# Patient Record
Sex: Female | Born: 1945
Health system: Southern US, Community
[De-identification: ages and names within clinical notes are randomized; demographics above are authoritative.]

## PROBLEM LIST (undated history)

## (undated) DIAGNOSIS — H3530 Unspecified macular degeneration: Secondary | ICD-10-CM

## (undated) DIAGNOSIS — Z9989 Dependence on other enabling machines and devices: Secondary | ICD-10-CM

## (undated) DIAGNOSIS — M8580 Other specified disorders of bone density and structure, unspecified site: Secondary | ICD-10-CM

## (undated) DIAGNOSIS — E538 Deficiency of other specified B group vitamins: Secondary | ICD-10-CM

## (undated) DIAGNOSIS — C9110 Chronic lymphocytic leukemia of B-cell type not having achieved remission: Secondary | ICD-10-CM

## (undated) DIAGNOSIS — G4733 Obstructive sleep apnea (adult) (pediatric): Secondary | ICD-10-CM

## (undated) DIAGNOSIS — M549 Dorsalgia, unspecified: Secondary | ICD-10-CM

## (undated) DIAGNOSIS — M2550 Pain in unspecified joint: Secondary | ICD-10-CM

## (undated) DIAGNOSIS — E559 Vitamin D deficiency, unspecified: Secondary | ICD-10-CM

## (undated) DIAGNOSIS — G5700 Lesion of sciatic nerve, unspecified lower limb: Secondary | ICD-10-CM

## (undated) DIAGNOSIS — M259 Joint disorder, unspecified: Secondary | ICD-10-CM

## (undated) DIAGNOSIS — E785 Hyperlipidemia, unspecified: Secondary | ICD-10-CM

## (undated) HISTORY — DX: Deficiency of other specified B group vitamins: E53.8

## (undated) HISTORY — DX: Pain in unspecified joint: M25.50

## (undated) HISTORY — DX: Dependence on other enabling machines and devices: Z99.89

## (undated) HISTORY — DX: Dorsalgia, unspecified: M54.9

## (undated) HISTORY — DX: Obstructive sleep apnea (adult) (pediatric): G47.33

## (undated) HISTORY — DX: Hyperlipidemia, unspecified: E78.5

## (undated) HISTORY — DX: Lesion of sciatic nerve, unspecified lower limb: G57.00

## (undated) HISTORY — DX: Unspecified macular degeneration: H35.30

## (undated) HISTORY — DX: Chronic lymphocytic leukemia of B-cell type not having achieved remission: C91.10

## (undated) HISTORY — DX: Vitamin D deficiency, unspecified: E55.9

## (undated) HISTORY — DX: Joint disorder, unspecified: M25.9

## (undated) HISTORY — DX: Other specified disorders of bone density and structure, unspecified site: M85.80

---

## 1973-01-26 HISTORY — PX: TONSILLECTOMY: SUR1361

## 1983-01-27 HISTORY — PX: ABDOMINAL HYSTERECTOMY: SHX81

## 2008-01-27 LAB — HM COLONOSCOPY: HM Colonoscopy: NORMAL

## 2010-01-26 HISTORY — PX: COLONOSCOPY: SHX174

## 2010-01-26 LAB — HM MAMMOGRAPHY: HM Mammogram: NORMAL

## 2010-04-10 LAB — CBC
HEMOGLOBIN: 14.5 g/dL
PLATELET COUNT: 178
WBC: 9.3

## 2010-04-10 LAB — VITAMIN D 25 HYDROXY (VIT D DEFICIENCY, FRACTURES): Vit D, 25-Hydroxy: 42

## 2010-04-10 LAB — LIPID PANEL
Cholesterol: 148 mg/dL (ref 0–200)
HDL: 51 mg/dL (ref 35–70)
LDL (calc): 67
Triglycerides: 152

## 2010-04-10 LAB — COMPREHENSIVE METABOLIC PANEL
ALK PHOS: 79 U/L
ALT: 20 U/L (ref 7–35)
AST: 18 U/L

## 2010-08-27 DIAGNOSIS — M8580 Other specified disorders of bone density and structure, unspecified site: Secondary | ICD-10-CM

## 2010-08-27 HISTORY — PX: OTHER SURGICAL HISTORY: SHX169

## 2010-08-27 HISTORY — DX: Other specified disorders of bone density and structure, unspecified site: M85.80

## 2013-01-17 ENCOUNTER — Encounter: Payer: Self-pay | Admitting: Family Medicine

## 2013-01-17 ENCOUNTER — Ambulatory Visit (INDEPENDENT_AMBULATORY_CARE_PROVIDER_SITE_OTHER): Payer: Medicare Other | Admitting: Family Medicine

## 2013-01-17 VITALS — BP 110/70 | HR 87 | Temp 97.8°F | Ht 66.0 in | Wt 180.0 lb

## 2013-01-17 DIAGNOSIS — G4733 Obstructive sleep apnea (adult) (pediatric): Secondary | ICD-10-CM | POA: Insufficient documentation

## 2013-01-17 DIAGNOSIS — R0981 Nasal congestion: Secondary | ICD-10-CM

## 2013-01-17 DIAGNOSIS — J302 Other seasonal allergic rhinitis: Secondary | ICD-10-CM | POA: Insufficient documentation

## 2013-01-17 DIAGNOSIS — J3489 Other specified disorders of nose and nasal sinuses: Secondary | ICD-10-CM

## 2013-01-17 DIAGNOSIS — E785 Hyperlipidemia, unspecified: Secondary | ICD-10-CM

## 2013-01-17 NOTE — Patient Instructions (Signed)
Sounds like you have a viral upper respiratory infection. Viral infections usually take 7-10 days to resolve.  Start over the counter either plain mucinex or immediate release guaifenesin with plenty of water to help mobilize mucous. Push fluids and plenty of rest. Continue nasal saline irrigation. Please let us know if you are not improving as expected, or if you have high fevers (>101.5) or difficulty swallowing or worsening productive cough. Call clinic with questions.  Good to see you today. Return in next few months fasting for blood work and afterwards for [employer] visit

## 2013-01-17 NOTE — Assessment & Plan Note (Signed)
Check FLP at next fasting blood work. 

## 2013-01-17 NOTE — Progress Notes (Signed)
Pre-visit discussion using our clinic review tool. No additional management support is needed unless otherwise documented below in the visit note.  

## 2013-01-17 NOTE — Assessment & Plan Note (Signed)
Anticipate viral URI - discussed supportive treatment as per instructions Discussed red flags to seek care or notify us if sxs persist or deteriorate.

## 2013-01-17 NOTE — Progress Notes (Signed)
Subjective:    Patient ID: Lori Shaw, female    DOB: 09-12-1945, 67 y.o.   MRN: [id number]  HPI CC: new pt to establish  Lori Shaw presents today to establish care.  Moved in April 2014 from [place].  Cared for husband prior to his decease (Alz and renal disease)  Nasal congestion for last 3 days - taking dayquil/nyquil.  Trouble sleeping 2/2 congestion.  H/o sleep apnea - unable to use 2/2 congestion.  Initial PNDrainage.  Intermittent.  Alternating congestion with rhinorrhea.  Yesterday morning had HA - improved with excedrin.  + sick contacts at church.  Blowing nose with clear mucous.  No h/o seasonal allergies or asthma.  No fevers/chills, ear or tooth pain, ST.  Did visit old house in [place] - may have had exposure to dust.  OSA on CPAP - setting of 5.  Will self refer to neurologist to establish with them for OSA HLD - prior on crestor, let script expire.  Preventative: Medicare started 2 yrs ago No recent CPE Flu shot Pneumovax 2014 Thinks has had tetanus shot zostavax 2013  Lives with daughter, 1 dog Occupation: retired, has worked in Pharmacologist, also Optometrist Edu: 1.5 yrs college Activity: square dancing Diet: good water, fruits/vegetables daily  Medications and allergies reviewed and updated in chart.  Past histories reviewed and updated if relevant as below. There are no active problems to display for this patient.  Past Medical History  Diagnosis Date  . HLD (hyperlipidemia)   . OSA on CPAP 2006    setting of 5, saw neurologist for this   Past Surgical History  Procedure Laterality Date  . Abdominal hysterectomy  01/27/1983    for menometrorrhagia, one ovary remained  . Tonsillectomy  01/26/1973   History  Substance Use Topics  . Smoking status: Former Smoker -- 2.00 packs/day for 20 years    Types: Cigarettes    Quit date: 01/27/1988  . Smokeless tobacco: Never Used  . Alcohol Use: No   Family History  Problem Relation  Age of Onset  . Arthritis Maternal Grandmother   . Hyperlipidemia Mother   . Hyperlipidemia Father   . CAD Mother     CABG  . CAD Father     MI  . Cancer Sister     breast  . Diabetes Neg Hx   . Stroke Neg Hx    Allergies  Allergen Reactions  . Sulfa Antibiotics    No current outpatient prescriptions on file prior to visit.   No current facility-administered medications on file prior to visit.     Review of Systems  Constitutional: Negative for fever, chills, activity change, appetite change, fatigue and unexpected weight change.  HENT: Positive for congestion, postnasal drip and rhinorrhea. Negative for hearing loss and sinus pressure.   Eyes: Negative for visual disturbance.  Respiratory: Negative for cough, chest tightness, shortness of breath and wheezing.   Cardiovascular: Negative for chest pain, palpitations and leg swelling.  Gastrointestinal: Negative for nausea, vomiting, abdominal pain, diarrhea, constipation, blood in stool and abdominal distention.  Genitourinary: Negative for hematuria and difficulty urinating.  Musculoskeletal: Negative for arthralgias, myalgias and neck pain.  Skin: Negative for rash.  Neurological: Positive for headaches (x1). Negative for dizziness, seizures and syncope.  Hematological: Negative for adenopathy. Does not bruise/bleed easily.  Psychiatric/Behavioral: Negative for dysphoric mood. The patient is not nervous/anxious.        Objective:   Physical Exam  Nursing note and vitals reviewed. Constitutional:  She is oriented to person, place, and time. She appears well-developed and well-nourished. No distress.  HENT:  Head: Normocephalic and atraumatic.  Right Ear: Hearing, tympanic membrane, external ear and ear canal normal.  Left Ear: Hearing, tympanic membrane, external ear and ear canal normal.  Nose: Mucosal edema present. No rhinorrhea. Right sinus exhibits no maxillary sinus tenderness and no frontal sinus tenderness. Left  sinus exhibits no maxillary sinus tenderness and no frontal sinus tenderness.  Mouth/Throat: Uvula is midline and mucous membranes are normal. Posterior oropharyngeal erythema present. No oropharyngeal exudate, posterior oropharyngeal edema or tonsillar abscesses.  PNdrainage  Eyes: Conjunctivae and EOM are normal. Pupils are equal, round, and reactive to light. No scleral icterus.  Neck: Normal range of motion. Neck supple.  Cardiovascular: Normal rate, regular rhythm, normal heart sounds and intact distal pulses.   No murmur heard. Pulses:      Radial pulses are 2+ on the right side, and 2+ on the left side.  Pulmonary/Chest: Effort normal and breath sounds normal. No respiratory distress. She has no wheezes. She has no rales.  Musculoskeletal: Normal range of motion.  Lymphadenopathy:    She has no cervical adenopathy.  Neurological: She is alert and oriented to person, place, and time.  CN grossly intact, station and gait intact  Skin: Skin is warm and dry. No rash noted.  Psychiatric: She has a normal mood and affect. Her behavior is normal. Judgment and thought content normal.       Assessment & Plan:  Pt has already requested records from prior PCP sent to our office.

## 2013-01-17 NOTE — Assessment & Plan Note (Signed)
Planning on re establishing with neurologist.

## 2013-01-31 ENCOUNTER — Encounter: Payer: Self-pay | Admitting: Family Medicine

## 2013-02-08 ENCOUNTER — Encounter: Payer: Self-pay | Admitting: *Deleted

## 2013-02-14 ENCOUNTER — Telehealth: Payer: Self-pay | Admitting: Family Medicine

## 2013-02-14 DIAGNOSIS — G4733 Obstructive sleep apnea (adult) (pediatric): Secondary | ICD-10-CM

## 2013-02-14 DIAGNOSIS — Z9989 Dependence on other enabling machines and devices: Principal | ICD-10-CM

## 2013-02-14 NOTE — Telephone Encounter (Signed)
Left detailed message on cell phone as instructed.

## 2013-02-14 NOTE — Telephone Encounter (Signed)
Pt came in office requesting referral to sleep doctor. She request to see Lori Seat, MD at [facility] [place]. Please advise.

## 2013-02-14 NOTE — Telephone Encounter (Signed)
plz notify referral placed - to expect a call from [name] in next week - if hasn't heard from [name], call us next week.

## 2013-02-16 ENCOUNTER — Encounter: Payer: Self-pay | Admitting: Neurology

## 2013-03-13 ENCOUNTER — Encounter: Payer: Self-pay | Admitting: Neurology

## 2013-03-13 ENCOUNTER — Ambulatory Visit (INDEPENDENT_AMBULATORY_CARE_PROVIDER_SITE_OTHER): Payer: Medicare HMO | Admitting: Neurology

## 2013-03-13 ENCOUNTER — Encounter (INDEPENDENT_AMBULATORY_CARE_PROVIDER_SITE_OTHER): Payer: Self-pay

## 2013-03-13 VITALS — BP 118/84 | HR 76 | Resp 17 | Ht 66.0 in | Wt 186.0 lb

## 2013-03-13 DIAGNOSIS — Z9989 Dependence on other enabling machines and devices: Principal | ICD-10-CM

## 2013-03-13 DIAGNOSIS — G4733 Obstructive sleep apnea (adult) (pediatric): Secondary | ICD-10-CM

## 2013-03-13 DIAGNOSIS — G478 Other sleep disorders: Secondary | ICD-10-CM

## 2013-03-13 NOTE — Progress Notes (Signed)
[facility]  Provider:  Larey Seat, M D  Referring Provider: Ria Bush, MD Primary Care Physician:  Ria Bush, MD  Chief Complaint  Patient presents with  . New Evaluation  . Sleep Apnea    HPI:  Lori Shaw is a 68 y.o. female  Is seen here as a referral   from Dr. Danise Mina for evaluation of her sleep apnea condition.  Dr. 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 She hasn't had a lot of congestion and rhinorrhea at the time she saw him and morning headaches but improved with Excedrin. Clear mucus discharge was described and Dr. Danise Mina notes state that the patient uses CPAP at a setting of 5 cm, which I suspect is the beginning of her RAMP  time and pressure.  End 2006 the patient approached her then primary care physician Dr. Shirley Friar, about feeling fatigued and sleepy. From there she was further referred  to a cardiologist, was workup also did not reveal a reason for her sleepiness. She was then referred to [place].  The sleep study was performed on 4 -- 11-07 and converted to SPLIT . It revealed obstructive sleep apnea the patient was diagnosed with an AHI of 10 per hour but an RDI of 52 per hour - this is a pattern of severe upper airway resistancy syndrome . Her risk factor was her weight at the time as identified by Dr. Minette Brine.  Today Lori Shaw endorsed 19 points on her fatigue score, each points on her Epworth sleepiness score and 1.1 her geriatric depression scale. Overall she's here to maintain sleep care she does not have a newly initiated. I was able to review her download for this patient's machine:  she uses a machine on average 8 hours and 6 minutes at night,  100% of the time and days,  but there no residual AHI data available.   Sleep habits are as follows; the patient normally goes to bed  between 9 and 9:30 PM and falls asleep fairly promptly, she rises early around 5 AM, she wakes up spontaneously this has been her rhythm for many years. The patient is retired but is looking to return to a part-time job. 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  A she drinks caffeinated beverages in the morning, she does not take daytime naps orgies not routinely. She normally does not encounters irresistible urge to fall asleep. But she may nod off when unstimulated and relaxed. The patient is a light box at home she also leaks the halls frequently in the morning hours and exposes herself to natural day light. She is taking square dancing lessons now, this has become her preferred exercise. This is her social activity , too. She joined a church.   The patient weight has been stabile within 12 pounds , she lost after and before her husbands death. She was exercising at [employer].  As the patient's family history she has 3 brothers are all used CPAP for the treatment of obstructive sleep apnea, but she probably reported to be the first of the siblings using CPAP. She also has 2 sisters that were not diagnosed as of yet.      Review of Systems: Out of a complete 14 system review, the patient complains of  only the following symptoms, and all other reviewed systems are negative. Epworth 8 FSS 19.    History   Social History  . Marital Status: Widowed    Spouse Name: N/A    Number of Children: 2  . Years of Education: 1.5   Occupational History  . retired    Social History Main Topics  . Smoking status: Former Smoker -- 2.00 packs/day for 20 years    Types: Cigarettes    Quit date: 01/26/1978  . Smokeless tobacco: Never Used  . Alcohol Use: No  . Drug Use: No  . Sexual Activity: Not on file   Other Topics Concern  . Not on file   Social History Narrative   Patient daughter lives with her.Lives with daughter,  1 dog   Occupation: retired, has worked in Chief Strategy Officer, also Forensic psychologist   Edu: 1.5 yrs college   Activity: square dancing   Diet: good water, fruits/vegetables daily   Patient drinks two cups of caffeine daily.    Family History  Problem Relation Age of Onset  . Arthritis Maternal Grandmother   . Hyperlipidemia Mother   . Hyperlipidemia Father   . CAD Mother     CABG  . CAD Father     MI  . Cancer Sister     breast  . Diabetes Neg Hx   . Stroke Neg Hx   . Alzheimer's disease    . Heart attack      Past Medical History  Diagnosis Date  . HLD (hyperlipidemia)   . OSA on CPAP 2006    setting of 5, saw neurologist for this  . Osteopenia 08/2010    T -1.4 at L femur neck    Past Surgical History  Procedure Laterality Date  . Abdominal hysterectomy  01/27/1983    for menometrorrhagia, one ovary remained  . Tonsillectomy  01/26/1973  . Dexa  08/2010    osteopenia    No current outpatient prescriptions on file.   No current facility-administered medications for this visit.    Allergies as of 03/13/2013 - Review Complete 03/13/2013  Allergen Reaction Noted  . Sulfa antibiotics  01/17/2013    Vitals: BP 118/84  Pulse 76  Resp 17  Ht 5\' 6"  (1.676 m)  Wt 186 lb (84.369 kg)  BMI 30.04 kg/m2 Last Weight:  Wt Readings from Last 1 Encounters:  03/13/13 186 lb (84.369 kg)   Last Height:   Ht Readings from Last 1 Encounters:  03/13/13 5\' 6"  (1.676 m)    Physical exam:  General: The patient is awake, alert and appears not in acute distress. The patient is well groomed. Head: Normocephalic, atraumatic. Neck is supple. Mallampati3, neck circumference: 16.5.  Cardiovascular:  Regular rate and rhythm , without  murmurs or carotid bruit, and without distended neck veins. Respiratory: Lungs are clear to auscultation. Skin:  Without evidence of edema, or rash Trunk: BMI is elevated and the  patient  has normal posture.   Neurologic exam : The patient is  awake and alert, oriented to place and time.  Memory subjective  described as intact. There is a normal attention span & concentration ability.  Speech is fluent without  dysarthria, dysphonia or aphasia. Mood and affect are appropriate.  Cranial nerves: Pupils are equal and briskly reactive to light. Funduscopic exam without  evidence of pallor or edema. Extraocular movements  in vertical and horizontal planes intact and without nystagmus. Visual fields by finger perimetry are intact. Hearing to  finger rub intact.  Facial sensation intact to fine touch. Facial motor strength is symmetric and tongue and uvula move midline.  Motor exam:   Normal tone and normal muscle bulk and symmetric normal strength in all extremities.  Sensory:  Fine touch, pinprick and vibration were tested in all extremities. Proprioception is tested in the upper extremities only. This was normal.  Coordination: Rapid alternating movements in the fingers/hands is tested and normal, without evidence of ataxia, dysmetria or tremor.  Gait and station: Patient walks without assistive device. Deep tendon reflexes: in the  upper and lower extremities are symmetric and intact. Babinski maneuver response is downgoing.   Assessment:  After physical and neurologic examination, review of laboratory studies, imaging, neurophysiology testing and pre-existing records, assessment is  symmetric muscle tone and excellent ROM, is interested in resuming her exercise routine, and this will certainly assist in weight loss. She has a Mallampati grade 2 with narrow lateral pillars. Nasal airflow was not restricted and she has not developed a nasal voice, she's not hoarse and she reports nor dysphonia or dysarthria.  I will order a new CPAP supplies for her as soon as the need arises, and connect  With [facility]: I would also ask the DMV to obtain a residual AHI level it appears that the patient is using an ultra titration device was a  minimum pressure of 5 cm septal a maximum possible pressure of 15 cm water. On most nights the graphic documentation indicates a use of CPAP at or on 9 or 10 cm water.   Plan:  Treatment plan and additional workup :

## 2013-03-15 ENCOUNTER — Encounter: Payer: Self-pay | Admitting: Family Medicine

## 2013-03-21 ENCOUNTER — Encounter: Payer: Self-pay | Admitting: Neurology

## 2013-04-09 ENCOUNTER — Other Ambulatory Visit: Payer: Self-pay | Admitting: Family Medicine

## 2013-04-09 DIAGNOSIS — E785 Hyperlipidemia, unspecified: Secondary | ICD-10-CM

## 2013-04-09 DIAGNOSIS — M8580 Other specified disorders of bone density and structure, unspecified site: Secondary | ICD-10-CM

## 2013-04-10 ENCOUNTER — Other Ambulatory Visit (INDEPENDENT_AMBULATORY_CARE_PROVIDER_SITE_OTHER): Payer: Medicare HMO

## 2013-04-10 DIAGNOSIS — M8580 Other specified disorders of bone density and structure, unspecified site: Secondary | ICD-10-CM

## 2013-04-10 DIAGNOSIS — M899 Disorder of bone, unspecified: Secondary | ICD-10-CM

## 2013-04-10 DIAGNOSIS — M949 Disorder of cartilage, unspecified: Secondary | ICD-10-CM

## 2013-04-10 DIAGNOSIS — E785 Hyperlipidemia, unspecified: Secondary | ICD-10-CM

## 2013-04-10 LAB — BASIC METABOLIC PANEL
BUN: 14 mg/dL (ref 6–23)
CO2: 29 meq/L (ref 19–32)
Calcium: 9.2 mg/dL (ref 8.4–10.5)
Chloride: 108 mEq/L (ref 96–112)
Creatinine, Ser: 0.7 mg/dL (ref 0.4–1.2)
GFR: 88.81 mL/min (ref >60.00)
GLUCOSE: 88 mg/dL (ref 70–99)
POTASSIUM: 4.3 meq/L (ref 3.5–5.1)
Sodium: 143 mEq/L (ref 135–145)

## 2013-04-10 LAB — LIPID PANEL
CHOLESTEROL: 213 mg/dL — AB (ref 0–200)
HDL: 48.8 mg/dL (ref >39.00)
LDL Cholesterol: 141 mg/dL — ABNORMAL HIGH (ref 0–99)
Total CHOL/HDL Ratio: 4
Triglycerides: 114 mg/dL (ref 0.0–149.0)
VLDL: 22.8 mg/dL (ref 0.0–40.0)

## 2013-04-10 NOTE — Addendum Note (Signed)
Addended by: Marchia Bond on: 04/10/2013 08:17 AM   Modules accepted: Orders

## 2013-04-17 ENCOUNTER — Encounter: Payer: BC Managed Care – PPO | Admitting: Family Medicine

## 2013-05-04 ENCOUNTER — Encounter: Payer: Self-pay | Admitting: Family Medicine

## 2013-05-04 ENCOUNTER — Ambulatory Visit (INDEPENDENT_AMBULATORY_CARE_PROVIDER_SITE_OTHER): Payer: Medicare HMO | Admitting: Family Medicine

## 2013-05-04 VITALS — BP 112/72 | HR 75 | Temp 97.9°F | Wt 175.0 lb

## 2013-05-04 DIAGNOSIS — M899 Disorder of bone, unspecified: Secondary | ICD-10-CM

## 2013-05-04 DIAGNOSIS — Z0000 Encounter for general adult medical examination without abnormal findings: Secondary | ICD-10-CM | POA: Insufficient documentation

## 2013-05-04 DIAGNOSIS — J302 Other seasonal allergic rhinitis: Secondary | ICD-10-CM

## 2013-05-04 DIAGNOSIS — M8580 Other specified disorders of bone density and structure, unspecified site: Secondary | ICD-10-CM

## 2013-05-04 DIAGNOSIS — E785 Hyperlipidemia, unspecified: Secondary | ICD-10-CM

## 2013-05-04 DIAGNOSIS — M949 Disorder of cartilage, unspecified: Secondary | ICD-10-CM

## 2013-05-04 DIAGNOSIS — J309 Allergic rhinitis, unspecified: Secondary | ICD-10-CM

## 2013-05-04 MED ORDER — FLUTICASONE PROPIONATE 50 MCG/ACT NA SUSP
2.0000 | Freq: Every day | NASAL | Status: DC
Start: 2013-05-04 — End: 2014-05-01

## 2013-05-04 MED ORDER — FLUTICASONE PROPIONATE 50 MCG/ACT NA SUSP: 2.0000 | Freq: Every day | NASAL | Status: DC

## 2013-05-04 NOTE — Progress Notes (Signed)
Pre visit review using our clinic review tool, if applicable. No additional management support is needed unless otherwise documented below in the visit note. 

## 2013-05-04 NOTE — Assessment & Plan Note (Signed)
Reviewed allergen avoidance measures. Start claritin, if uncontrolled, add flonase (sent to pharmacy)

## 2013-05-04 NOTE — Progress Notes (Signed)
BP 112/72  Pulse 75  Temp(Src) 97.9 F (36.6 C) (Oral)  Wt 175 lb (79.379 kg)  SpO2 97%   CC: medicare wellness visit  Subjective:    Patient ID: Lori Shaw, female    DOB: 13-Jun-1945, 68 y.o.   MRN: [id number]  HPI: Lori Shaw is a 68 y.o. female presenting on 05/04/2013 for Annual Exam   To get married next month. Nasal congestion - takes nasal saline for this.  Thicker mucous in mornings.  No fevers/chills.  No OTC meds tried yet.  Rhinorrhea and PNdrainage alternating with sinus congestion.  Passes hearing and vision screens. Denies depression/anhedonia or falls.  Preventative: Medicare started 2 yrs ago Colon cancer screening - ~2010 per pt normal, rec rpt 40 yrs Well woman exam - s/p hysterectomy with 1 ovary remaining.  No longer receives pap smears. Mammogram - last 2 yrs ago. Due for this but will postpone mammogram for now.   Flu shot 10/2012 Pneumovax 10/2012  Tdap 03/2006 zostavax 2013  Dexa Date: 08/2010 osteopenia.  Drinks 2 8oz glasses of milk daily.  H/o low vit D in past. Advanced directives: does not have set up.  Would want new husband to be HCPOA Metallurgist)  Lives with daughter, 1 dog  Occupation: retired, has worked in Chief Strategy Officer, also Forensic psychologist  Edu: 1.5 yrs college  Activity: square dancing  Diet: good water, fruits/vegetables daily   Relevant past medical, surgical, family and social history reviewed and updated as indicated.  Allergies and medications reviewed and updated. No current outpatient prescriptions on file prior to visit.   No current facility-administered medications on file prior to visit.    Review of Systems Per HPI unless specifically indicated above    Objective:    BP 112/72  Pulse 75  Temp(Src) 97.9 F (36.6 C) (Oral)  Wt 175 lb (79.379 kg)  SpO2 97%  Physical Exam  Nursing note and vitals reviewed. Constitutional: She is oriented to person, place, and time. She appears well-developed and  well-nourished. No distress.  HENT:  Head: Normocephalic and atraumatic.  Right Ear: Hearing, tympanic membrane, external ear and ear canal normal.  Left Ear: Hearing, tympanic membrane, external ear and ear canal normal.  Nose: Mucosal edema present.  Mouth/Throat: Uvula is midline, oropharynx is clear and moist and mucous membranes are normal. No oropharyngeal exudate, posterior oropharyngeal edema or posterior oropharyngeal erythema.  Thick white posterior nasal drainage present Pale boggy turbinates  Eyes: Conjunctivae and EOM are normal. Pupils are equal, round, and reactive to light. No scleral icterus.  Neck: Normal range of motion. Neck supple. Carotid bruit is not present. No thyromegaly present.  Cardiovascular: Normal rate, regular rhythm, normal heart sounds and intact distal pulses.   No murmur heard. Pulses:      Radial pulses are 2+ on the right side, and 2+ on the left side.  Pulmonary/Chest: Effort normal and breath sounds normal. No respiratory distress. She has no wheezes. She has no rales. Right breast exhibits no inverted nipple, no mass, no nipple discharge, no skin change and no tenderness. Left breast exhibits no inverted nipple, no mass, no nipple discharge, no skin change and no tenderness.  Musculoskeletal: Normal range of motion. She exhibits no edema.  Lymphadenopathy:       Head (right side): No submental, no submandibular, no tonsillar, no preauricular and no posterior auricular adenopathy present.       Head (left side): No submental, no submandibular, no tonsillar, no preauricular and  no posterior auricular adenopathy present.    She has no cervical adenopathy.    She has no axillary adenopathy.       Right axillary: No lateral adenopathy present.       Left axillary: No lateral adenopathy present.      Right: No supraclavicular adenopathy present.       Left: No supraclavicular adenopathy present.  Neurological: She is alert and oriented to person, place,  and time.  CN grossly intact, station and gait intact 3/3 recall 5/5 calculation serial 3s  Skin: Skin is warm and dry. No rash noted.  Psychiatric: She has a normal mood and affect. Her behavior is normal. Judgment and thought content normal.   Results for orders placed in visit on 05/04/13  HM COLONOSCOPY      Result Value Ref Range   HM Colonoscopy per pt normal        Assessment & Plan:   Problem List Items Addressed This Visit   HLD (hyperlipidemia)     Chronic, reviewed foods to help lower LDL.    Seasonal allergic rhinitis     Reviewed allergen avoidance measures. Start claritin, if uncontrolled, add flonase (sent to pharmacy)    Osteopenia     Reviewed recommended cal/vit D daily. Start 1000 IU vit D    Medicare annual wellness visit, subsequent - Primary     I have personally reviewed the Medicare Annual Wellness questionnaire and have noted 1. The patient's medical and social history 2. Their use of alcohol, tobacco or illicit drugs 3. Their current medications and supplements 4. The patient's functional ability including ADL's, fall risks, home safety risks and hearing or visual impairment. 5. Diet and physical activity 6. Evidence for depression or mood disorders The patients weight, height, BMI have been recorded in the chart.  Hearing and vision has been addressed. I have made referrals, counseling and provided education to the patient based review of the above and I have provided the pt with a written personalized care plan for preventive services. See scanned questionairre. Advanced directives discussed: would want fiance to be HCPOA.  Reviewed preventative protocols and updated unless pt declined. Breast examtoday.  Pt will call us when desires to set up mammogram later this year.        Follow up plan: Return in about 1 year (around 05/05/2014), or as needed, for annual exam, prior fasting for blood work.

## 2013-05-04 NOTE — Assessment & Plan Note (Signed)
Chronic, reviewed foods to help lower LDL.

## 2013-05-04 NOTE — Assessment & Plan Note (Signed)
I have personally reviewed the Medicare Annual Wellness questionnaire and have noted 1. The patient's medical and social history 2. Their use of alcohol, tobacco or illicit drugs 3. Their current medications and supplements 4. The patient's functional ability including ADL's, fall risks, home safety risks and hearing or visual impairment. 5. Diet and physical activity 6. Evidence for depression or mood disorders The patients weight, height, BMI have been recorded in the chart.  Hearing and vision has been addressed. I have made referrals, counseling and provided education to the patient based review of the above and I have provided the pt with a written personalized care plan for preventive services. See scanned questionairre. Advanced directives discussed: would want fiance to be HCPOA.  Reviewed preventative protocols and updated unless pt declined. Breast examtoday.  Pt will call us when desires to set up mammogram later this year.

## 2013-05-04 NOTE — Patient Instructions (Signed)
Give me a call when you'd like to schedule mammogram later this year. Start plain claritin over the counter.  If this doesn't cut it, start flonase daily - may need to do this during bad allergy season. Look into setting up advanced directive.  Let us know if you'd like information on this. Return as needed or in 1 year for next wellness exam. Start vitamin D 1000 units daily over the counter. Good to see you today! Call us with questions.

## 2013-05-04 NOTE — Assessment & Plan Note (Signed)
Reviewed recommended cal/vit D daily. Start 1000 IU vit D

## 2013-09-06 ENCOUNTER — Telehealth: Payer: Self-pay | Admitting: Family Medicine

## 2013-09-06 NOTE — Telephone Encounter (Signed)
Pt stated it was time  to get a mammogram  She has never had a mammogram here in [place].  Her last one was in [place] Pt doesn't care if she goes to [place] or [place] Monday and tuesdays are not good day

## 2013-09-07 NOTE — Telephone Encounter (Signed)
Called the patient back and gave her the number to [facility] to schedule her own mammogram.

## 2013-09-13 ENCOUNTER — Telehealth: Payer: Self-pay | Admitting: *Deleted

## 2013-09-13 NOTE — Telephone Encounter (Signed)
[facility] Assessment in your IN box for review.

## 2013-09-15 NOTE — Telephone Encounter (Signed)
noted 

## 2013-10-13 ENCOUNTER — Ambulatory Visit: Payer: Self-pay | Admitting: Family Medicine

## 2013-10-16 ENCOUNTER — Encounter: Payer: Self-pay | Admitting: Family Medicine

## 2013-10-20 ENCOUNTER — Ambulatory Visit (INDEPENDENT_AMBULATORY_CARE_PROVIDER_SITE_OTHER): Payer: Medicare HMO | Admitting: Family Medicine

## 2013-10-20 ENCOUNTER — Encounter: Payer: Self-pay | Admitting: Family Medicine

## 2013-10-20 VITALS — BP 110/70 | HR 64 | Temp 98.4°F | Wt 179.0 lb

## 2013-10-20 DIAGNOSIS — J019 Acute sinusitis, unspecified: Secondary | ICD-10-CM | POA: Insufficient documentation

## 2013-10-20 DIAGNOSIS — J018 Other acute sinusitis: Secondary | ICD-10-CM

## 2013-10-20 MED ORDER — AMOXICILLIN-POT CLAVULANATE 875-125 MG PO TABS: 1.0000 | ORAL_TABLET | Freq: Two times a day (BID) | ORAL | Status: AC

## 2013-10-20 NOTE — Assessment & Plan Note (Signed)
Presumed bacterial given progression and duration of sxs. Treat with augmentin course. Update if not improving as expected.  Pt agrees with plan.

## 2013-10-20 NOTE — Patient Instructions (Signed)
You have a sinus infection. Take medicine as prescribed: augmentin 10 d course Push fluids and plenty of rest. Nasal saline irrigation or neti pot to help drain sinuses. May use simple mucinex with plenty of fluid to help mobilize mucous. Let us know if fever >101.5, trouble opening/closing mouth, difficulty swallowing, or worsening - you may need to be seen again.

## 2013-10-20 NOTE — Progress Notes (Signed)
   BP 110/70  Pulse 64  Temp(Src) 98.4 F (36.9 C) (Oral)  Wt 179 lb (81.194 kg)  SpO2 98%   CC: cough/congestion  Subjective:    Patient ID: Lori Shaw, female    DOB: 1945/05/09, 68 y.o.   MRN: [id number]  HPI: Lori Shaw is a 68 y.o. female presenting on 10/20/2013 for Sinusitis and Cough   3 wk h/o congestion, hoarse voice, cough with productive mucous. Now with dry cough. + ear and tooth pain.+ PNdrainage. + pressure headache as well for last 2-3 days. She did feel better for 3-4 days but now symptoms are recurring h/o asthma.  No fevers/chills,   Husband recently ill.  No sick contacts at home. No h/o asthma. Has tried OTC cough meds.  Relevant past medical, surgical, family and social history reviewed and updated as indicated.  Allergies and medications reviewed and updated. Current Outpatient Prescriptions on File Prior to Visit  Medication Sig  . cholecalciferol (VITAMIN D) 1000 UNITS tablet Take 1,000 Units by mouth daily.  . fluticasone (FLONASE) 50 MCG/ACT nasal spray Place 2 sprays into both nostrils daily.   No current facility-administered medications on file prior to visit.    Review of Systems Per HPI unless specifically indicated above    Objective:    BP 110/70  Pulse 64  Temp(Src) 98.4 F (36.9 C) (Oral)  Wt 179 lb (81.194 kg)  SpO2 98%  Physical Exam  Nursing note and vitals reviewed. Constitutional: She appears well-developed and well-nourished. No distress.  HENT:  Head: Normocephalic and atraumatic.  Right Ear: Hearing, external ear and ear canal normal.  Left Ear: Hearing, tympanic membrane, external ear and ear canal normal.  Nose: Mucosal edema present. No rhinorrhea. Right sinus exhibits no maxillary sinus tenderness and no frontal sinus tenderness. Left sinus exhibits no maxillary sinus tenderness and no frontal sinus tenderness.  Mouth/Throat: Uvula is midline, oropharynx is clear and moist and mucous membranes are normal. No  oropharyngeal exudate, posterior oropharyngeal edema, posterior oropharyngeal erythema or tonsillar abscesses.  R TM covered by cerumen  Eyes: Conjunctivae and EOM are normal. Pupils are equal, round, and reactive to light. No scleral icterus.  Neck: Normal range of motion. Neck supple.  Cardiovascular: Normal rate, regular rhythm, normal heart sounds and intact distal pulses.   No murmur heard. Pulmonary/Chest: Effort normal and breath sounds normal. No respiratory distress. She has no wheezes. She has no rales.  Lymphadenopathy:    She has no cervical adenopathy.  Skin: Skin is warm and dry. No rash noted.       Assessment & Plan:   Problem List Items Addressed This Visit   Acute sinusitis - Primary     Presumed bacterial given progression and duration of sxs. Treat with augmentin course. Update if not improving as expected.  Pt agrees with plan.    Relevant Medications      amoxicillin-clavulanate (AUGMENTIN) tablet 875-125 mg       Follow up plan: Return if symptoms worsen or fail to improve.

## 2013-10-20 NOTE — Progress Notes (Signed)
Pre visit review using our clinic review tool, if applicable. No additional management support is needed unless otherwise documented below in the visit note. 

## 2013-11-27 ENCOUNTER — Encounter: Payer: Self-pay | Admitting: Family Medicine

## 2014-04-24 ENCOUNTER — Ambulatory Visit (INDEPENDENT_AMBULATORY_CARE_PROVIDER_SITE_OTHER): Payer: Medicare HMO | Admitting: Neurology

## 2014-04-24 ENCOUNTER — Encounter: Payer: Self-pay | Admitting: Neurology

## 2014-04-24 VITALS — BP 119/69 | HR 69 | Resp 18 | Ht 66.5 in | Wt 192.8 lb

## 2014-04-24 DIAGNOSIS — R51 Headache: Secondary | ICD-10-CM

## 2014-04-24 DIAGNOSIS — R0683 Snoring: Secondary | ICD-10-CM | POA: Diagnosis not present

## 2014-04-24 DIAGNOSIS — G4733 Obstructive sleep apnea (adult) (pediatric): Secondary | ICD-10-CM

## 2014-04-24 DIAGNOSIS — R519 Headache, unspecified: Secondary | ICD-10-CM | POA: Insufficient documentation

## 2014-04-24 DIAGNOSIS — Z9989 Dependence on other enabling machines and devices: Secondary | ICD-10-CM

## 2014-04-24 NOTE — Progress Notes (Signed)
[facility]   Provider:  Larey Seat, M D  Referring Provider: Ria Bush, MD Primary Care Physician:  Lori Bush, MD  Chief Complaint  Patient presents with  . RV Cpap  (annual)    Rm 11, alone    HPI:  Lori Shaw is a 69 y.o. female  Is seen here as a referral   from Dr. Danise Shaw for evaluation of her sleep apnea condition.  Dr. 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 She hasn't had a lot of congestion and rhinorrhea at the time she saw him and morning headaches but improved with Excedrin. Clear mucus discharge was described and Dr. Danise Shaw notes state that the patient uses CPAP at a setting of 5 cm, which I suspect is the beginning of her RAMP  time and pressure.  End 2006 the patient approached her then primary care physician Dr. Shirley Shaw, about feeling fatigued and sleepy. From there she was further referred  to a cardiologist, was workup also did not reveal a reason for her sleepiness. She was then referred to [place].   The sleep study was performed on 4 -- 11- 2007 and converted to SPLIT . It revealed obstructive sleep apnea the patient was diagnosed with an AHI of 10 per hour but an RDI of 52 per hour - this is a pattern of severe upper airway resistancy syndrome . Her risk factor was her weight at the time as identified by Dr. Minette Shaw. Lori Shaw endorsed 19 points on her fatigue score, each points on her Epworth sleepiness score and 1.1 her geriatric depression scale. Overall she's here to maintain sleep care she does not have a newly initiated. I was able to review her download for this patient's machine:  she uses a machine on average 8 hours and 6 minutes at night,  100% of the time and days,  but there no residual AHI data available.  Sleep habits are as follows; the patient  normally goes to bed between 9 and 9:30 PM and falls asleep fairly promptly, she rises early around 5 AM, she wakes up spontaneously this has been her rhythm for many years. The patient is retired but is looking to return to a part-time job. 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  A she drinks caffeinated beverages in the morning, she does not take daytime naps orgies not routinely. She normally does not encounters irresistible urge to fall asleep. But she may nod off when unstimulated and relaxed. The patient is a light box at home she also leaks the halls frequently in the morning hours and exposes herself to natural day light. She is taking square dancing lessons now, this has become her preferred exercise. This is her social activity , too. She joined a church, [employer] . . The patient weight has been stabile within 12 pounds , she lost after and before her husbands death. She was exercising at [employer]. As the patient's family history she has 3 brothers are all used CPAP for the treatment of obstructive sleep apnea, but she probably reported to be the first of the siblings using CPAP. She also has 2 sisters that were not diagnosed as of yet.   04-24-14,  Lori Shaw is here today for her  first revisit since last consultation. Him she has used her CPAP machine religiously she has 100% of compliance by days used to do an average daily use at time of 7 hours and 53 minutes. The residual AHI for this device, a system 1 REM Star by Lori Shaw cannot be obtained. However the machine starts with a ramp time of 20 minutes at 4 cm water and runs up to 8 cm water pressure, but  suddently switches off in the middle of the night, leaving her scared . Marland Kitchen The current machine is her second one and is over 6 years old. Is very noisy heavy and difficult to travel with.  Will repeat a sleep study to qualify her .  [facility] is her DME.  Mrs.  Lori Shaw just remarried and she and her husband would like to be more active and traveling a lot. For this reason I would suggest that we replace her machine with a dream station. ( PM bedtime, 15 minutes latency, without CPAP 3-4  [place], NEW:  Morning headache's , now feeling CPAP not providing pressure enough, unreliable function.       Review of Systems: Out of a complete 14 system review, the patient complains of only the following symptoms, and all other reviewed systems are negative. Epworth 10 from 8,   FSS 41 from previous 19.  Much more, and she is bothered in her sleep by the CPAPs dysfunction.   NEW onset morning headaches  Active lifestyle.  Remarried.     History   Social History  . Marital Status: Married    Spouse Name: N/A  . Number of Children: 2  . Years of Education: 1.5   Occupational History  . retired    Social History Main Topics  . Smoking status: Former Smoker -- 2.00 packs/day for 20 years    Types: Cigarettes    Quit date: 01/26/1978  . Smokeless tobacco: Never Used  . Alcohol Use: No  . Drug Use: No  . Sexual Activity: Not on file   Other Topics Concern  . Not on file   Social History Narrative   Patient daughter lives with her.Lives with daughter, 1 dog   Occupation: retired, has worked in Chief Strategy Officer, also Forensic psychologist   Edu: 1.5 yrs college   Activity: square dancing   Diet: good water, fruits/vegetables daily   Patient drinks two cups of caffeine daily.   Remarried.      Family History  Problem Relation Age of Onset  . Arthritis Maternal Grandmother   . Hyperlipidemia Mother   . Hyperlipidemia Father   . CAD Mother     CABG  . CAD Father     MI  . Cancer Sister     breast  . Diabetes Neg Hx   . Stroke Neg Hx   . Alzheimer's disease    . Heart attack      Past Medical History  Diagnosis Date  . HLD (hyperlipidemia)   . OSA on CPAP 2006    averages 9-10 cm H2O ([facility])  . Osteopenia 08/2010    T -1.4 at L  femur neck    Past Surgical History  Procedure Laterality Date  . Abdominal hysterectomy  01/27/1983    for menometrorrhagia, one ovary remained  . Tonsillectomy  01/26/1973  . Dexa  08/2010    osteopenia    Current Outpatient Prescriptions  Medication Sig Dispense Refill  . cholecalciferol (VITAMIN D) 1000 UNITS tablet Take 1,000 Units by mouth  daily.    . fluticasone (FLONASE) 50 MCG/ACT nasal spray Place 2 sprays into both nostrils daily. (Patient taking differently: Place 2 sprays into both nostrils daily as needed. ) 16 g 3   No current facility-administered medications for this visit.    Allergies as of 04/24/2014 - Review Complete 04/24/2014  Allergen Reaction Noted  . Sulfa antibiotics  01/17/2013    Vitals: BP 119/69 mmHg  Pulse 69  Resp 18  Ht 5' 6.5" (1.689 m)  Wt 192 lb 12.8 oz (87.454 kg)  BMI 30.66 kg/m2 Last Weight:  Wt Readings from Last 1 Encounters:  04/24/14 192 lb 12.8 oz (87.454 kg)   Last Height:   Ht Readings from Last 1 Encounters:  04/24/14 5' 6.5" (1.689 m)    Physical exam:  General: The patient is awake, alert and appears not in acute distress. The patient is well groomed. Head: Normocephalic, atraumatic. Neck is supple. Mallampati3, neck circumference: 16.5.  Cardiovascular:  Regular rate and rhythm , without  murmurs or carotid bruit, and without distended neck veins. Respiratory: Lungs are clear to auscultation. Skin:  Without evidence of edema, or rash Trunk: BMI is elevated and the  patient  has normal posture.   Neurologic exam : The patient is awake and alert, oriented to place and time.  Memory subjective  described as intact. There is a normal attention span & concentration ability.  Speech is fluent without  dysarthria, dysphonia or aphasia. Mood and affect are appropriate.  Cranial nerves: Pupils are equal and briskly reactive to light. Funduscopic exam without  evidence of pallor or edema. Extraocular movements  in vertical  and horizontal planes intact and without nystagmus. Visual fields by finger perimetry are intact. Hearing to finger rub intact.  Facial sensation intact to fine touch. Facial motor strength is symmetric and tongue and uvula move midline.  Motor exam:   Normal tone and normal muscle bulk and symmetric normal strength in all extremities.  Sensory:  Fine touch, pinprick and vibration were tested in all extremities. Proprioception is tested in the upper extremities only. This was normal.  Coordination: Rapid alternating movements in the fingers/hands is tested and normal, without evidence of ataxia, dysmetria or tremor.  Gait and station: Patient walks without assistive device. Deep tendon reflexes: in the  upper and lower extremities are symmetric and intact. Babinski maneuver response is downgoing.   Assessment:  After physical and neurologic examination, review of laboratory studies, imaging, neurophysiology testing and pre-existing records, assessment is  symmetric muscle tone and excellent ROM, is interested in resuming her exercise routine, and this will certainly assist in weight loss.  She has a Mallampati grade 2 with narrow lateral pillars. Nasal airflow was not restricted and she has not developed a nasal voice, she's not hoarse and she reports nor dysphonia or dysarthria.  I will order a new CPAP machine after her sleep study confirms apnea still to be present.  DME [facility]:   Plan:  Treatment plan and additional workup :  PSG Split, if qualified start Dream station and dream ware with [facility].

## 2014-04-27 ENCOUNTER — Encounter: Payer: Self-pay | Admitting: Neurology

## 2014-05-01 ENCOUNTER — Ambulatory Visit (INDEPENDENT_AMBULATORY_CARE_PROVIDER_SITE_OTHER): Payer: Medicare HMO | Admitting: Primary Care

## 2014-05-01 ENCOUNTER — Encounter: Payer: Self-pay | Admitting: Primary Care

## 2014-05-01 VITALS — BP 118/72 | HR 64 | Temp 97.7°F | Wt 191.4 lb

## 2014-05-01 DIAGNOSIS — R059 Cough, unspecified: Secondary | ICD-10-CM

## 2014-05-01 DIAGNOSIS — J302 Other seasonal allergic rhinitis: Secondary | ICD-10-CM

## 2014-05-01 DIAGNOSIS — R05 Cough: Secondary | ICD-10-CM

## 2014-05-01 MED ORDER — BENZONATATE 100 MG PO CAPS: 100.0000 mg | ORAL_CAPSULE | Freq: Three times a day (TID) | ORAL | Status: DC | PRN

## 2014-05-01 MED ORDER — FLUTICASONE PROPIONATE 50 MCG/ACT NA SUSP
2.0000 | Freq: Every day | NASAL | Status: DC | PRN
Start: 2014-05-01 — End: 2015-06-20

## 2014-05-01 NOTE — Assessment & Plan Note (Signed)
Suspect this is related to her seasonal allergies because of her symptoms beginning after working outside in her yard. Start daily Claritin, continue flonase and Mucinex.  Tessalon Pearls provided PRN for cough. Follow up if no improvement in the next 5 days.

## 2014-05-01 NOTE — Patient Instructions (Signed)
Your symptoms are likely due to seasonal allergies. Start daily Claritin for allergy symptoms. Use Flonase daily as directed. Use Tessalon Pearls as needed for cough. Please call me if no improvement in the next 4-5 days.  Drink plenty of fluids and take care!

## 2014-05-01 NOTE — Progress Notes (Signed)
Pre visit review using our clinic review tool, if applicable. No additional management support is needed unless otherwise documented below in the visit note. 

## 2014-05-01 NOTE — Progress Notes (Signed)
Subjective:    Patient ID: Lori Shaw, female    DOB: December 10, 1945, 69 y.o.   MRN: [id number]  HPI  Ms. Lori Shaw is a 69 year old female who presents today with a chief complaint of sore throat, nasal congestion, and cough since working in the yard outside last Friday. She also reports a headache that started Saturday, and weakness/depletion of energy with puffiness to her eyes on Sunday. She started feeling better on Monday but only made it half way throught the day before feeing tired. This morning she then noticed some chest congestion. She's taking flonase, mucinex, aspirin, and has had some improvement with those remedies. Her sore throat has improved, but her dry cough remains. Her symptoms are not worsening, but are lingering.  Review of Systems  Constitutional: Negative for fever and chills.  HENT: Positive for congestion, rhinorrhea and sneezing. Negative for ear pain, postnasal drip, sinus pressure and sore throat.   Eyes: Negative for itching.  Respiratory: Negative for shortness of breath.   Cardiovascular: Negative for chest pain.  Gastrointestinal: Negative for nausea and vomiting.  Musculoskeletal: Negative for myalgias.  Neurological: Positive for headaches.       Past Medical History  Diagnosis Date  . HLD (hyperlipidemia)   . OSA on CPAP 2006    averages 9-10 cm H2O ([facility])  . Osteopenia 08/2010    T -1.4 at L femur neck    History   Social History  . Marital Status: Married    Spouse Name: N/A  . Number of Children: 2  . Years of Education: 1.5   Occupational History  . retired    Social History Main Topics  . Smoking status: Former Smoker -- 2.00 packs/day for 20 years    Types: Cigarettes    Quit date: 01/26/1978  . Smokeless tobacco: Never Used  . Alcohol Use: No  . Drug Use: No  . Sexual Activity: Not on file   Other Topics Concern  . Not on file   Social History Narrative   Patient daughter lives with her.Lives with daughter, 1 dog   Occupation: retired, has worked in Chief Strategy Officer, also Forensic psychologist   Edu: 1.5 yrs college   Activity: square dancing   Diet: good water, fruits/vegetables daily   Patient drinks two cups of caffeine daily.   Remarried.      Past Surgical History  Procedure Laterality Date  . Abdominal hysterectomy  01/27/1983    for menometrorrhagia, one ovary remained  . Tonsillectomy  01/26/1973  . Dexa  08/2010    osteopenia    Family History  Problem Relation Age of Onset  . Arthritis Maternal Grandmother   . Hyperlipidemia Mother   . Hyperlipidemia Father   . CAD Mother     CABG  . CAD Father     MI  . Cancer Sister     breast  . Diabetes Neg Hx   . Stroke Neg Hx   . Alzheimer's disease    . Heart attack      Allergies  Allergen Reactions  . Sulfa Antibiotics     Current Outpatient Prescriptions on File Prior to Visit  Medication Sig Dispense Refill  . cholecalciferol (VITAMIN D) 1000 UNITS tablet Take 1,000 Units by mouth daily.    . fluticasone (FLONASE) 50 MCG/ACT nasal spray Place 2 sprays into both nostrils daily. (Patient taking differently: Place 2 sprays into both nostrils daily as needed. ) 16 g 3   No current facility-administered medications  on file prior to visit.    BP 118/72 mmHg  Pulse 64  Temp(Src) 97.7 F (36.5 C) (Oral)  Wt 191 lb 6.4 oz (86.818 kg)  SpO2 98%    Objective:   Physical Exam  Constitutional: She is oriented to person, place, and time. She appears well-developed.  Eyes: Conjunctivae are normal. Pupils are equal, round, and reactive to light.  Neck: Neck supple.  Cardiovascular: Normal rate and regular rhythm.   Pulmonary/Chest: Effort normal and breath sounds normal.  Lymphadenopathy:    She has no cervical adenopathy.  Neurological: She is alert and oriented to person, place, and time.  Skin: Skin is warm and dry.  Psychiatric: She has a normal mood and affect.          Assessment & Plan:

## 2014-05-14 ENCOUNTER — Ambulatory Visit: Payer: Medicare HMO | Admitting: Primary Care

## 2014-05-14 ENCOUNTER — Telehealth: Payer: Self-pay

## 2014-05-14 NOTE — Telephone Encounter (Signed)
She will need to be seen in the office again since it's been several weeks.  Thank you.

## 2014-05-14 NOTE — Telephone Encounter (Signed)
Pt left /vm; pt seen 05/01/14; pt has been taking med as instructed but still having a lot of head congestion, h/a and earache. Pt request [facility].[place].

## 2014-05-14 NOTE — Telephone Encounter (Signed)
Called and spoken to patient. She has appt with Anda Kraft on Tuesday 05/15/14 at 8 am for a follow up.

## 2014-05-15 ENCOUNTER — Ambulatory Visit (INDEPENDENT_AMBULATORY_CARE_PROVIDER_SITE_OTHER): Payer: Medicare HMO | Admitting: Primary Care

## 2014-05-15 ENCOUNTER — Encounter: Payer: Self-pay | Admitting: Primary Care

## 2014-05-15 VITALS — BP 120/74 | HR 66 | Temp 97.7°F | Ht 66.5 in | Wt 192.1 lb

## 2014-05-15 DIAGNOSIS — B9689 Other specified bacterial agents as the cause of diseases classified elsewhere: Secondary | ICD-10-CM

## 2014-05-15 DIAGNOSIS — J019 Acute sinusitis, unspecified: Secondary | ICD-10-CM

## 2014-05-15 MED ORDER — AMOXICILLIN-POT CLAVULANATE 875-125 MG PO TABS
1.0000 | ORAL_TABLET | Freq: Two times a day (BID) | ORAL | Status: DC
Start: 2014-05-15 — End: 2014-09-18

## 2014-05-15 NOTE — Progress Notes (Signed)
Pre visit review using our clinic review tool, if applicable. No additional management support is needed unless otherwise documented below in the visit note. 

## 2014-05-15 NOTE — Assessment & Plan Note (Signed)
Moderately tender to frontal and maxillary sinuses. Symptoms now have been present for 2 weeks and are worsening. RX for Augmentin due to duration and suspicion of bacterial involvement.  Continue Flonase and Claritin for allergy symptoms. Follow up if no improvement in 3-4 days on Augmentin.

## 2014-05-15 NOTE — Patient Instructions (Signed)
Start Augmentin antibiotic for your sinus infection. Take one tablet by mouth twice daily for 10 days until gone. Continue to use the Claritin and Flonase daily. Follow up if no improvement in 3-4 days once starting the antibiotic. Take care!

## 2014-05-15 NOTE — Progress Notes (Signed)
Subjective:    Patient ID: Lori Shaw, female    DOB: 07-25-1945, 69 y.o.   MRN: [id number]  HPI  Ms. Lori Shaw is a 69 year old female who presents today for follow up. She was evaluated on April 5th for sore throat, nasal congestion, sinus pressure, and cough after working in her yard on April 1st. Her symptoms were determined to be allergy related at that time. She felt better after treatment on the 5th with an improvement in her cough. Shortly after improvement her sinus pressure and headaches worsened with the development of right ear pain, eye puffiness, and now has had a depletion of energy since Friday the 15th. Denies cough, fever, nausea/vomiting. She has been taking claritin, mucinex, Flonase, and aspirin.   Review of Systems  Constitutional: Positive for chills and fatigue. Negative for fever.  HENT: Positive for congestion, ear pain and sinus pressure.   Respiratory: Negative for cough and shortness of breath.   Cardiovascular: Negative for chest pain.  Gastrointestinal: Negative for nausea and vomiting.  Musculoskeletal: Negative for myalgias.       Past Medical History  Diagnosis Date  . HLD (hyperlipidemia)   . OSA on CPAP 2006    averages 9-10 cm H2O ([facility]ier)  . Osteopenia 08/2010    T -1.4 at L femur neck    History   Social History  . Marital Status: Married    Spouse Name: N/A  . Number of Children: 2  . Years of Education: 1.5   Occupational History  . retired    Social History Main Topics  . Smoking status: Former Smoker -- 2.00 packs/day for 20 years    Types: Cigarettes    Quit date: 01/26/1978  . Smokeless tobacco: Never Used  . Alcohol Use: No  . Drug Use: No  . Sexual Activity: Not on file   Other Topics Concern  . Not on file   Social History Narrative   Patient daughter lives with her.Lives with daughter, 1 dog   Occupation: retired, has worked in Chief Strategy Officer, also Forensic psychologist   Edu: 1.5 yrs college   Activity: square  dancing   Diet: good water, fruits/vegetables daily   Patient drinks two cups of caffeine daily.   Remarried.      Past Surgical History  Procedure Laterality Date  . Abdominal hysterectomy  01/27/1983    for menometrorrhagia, one ovary remained  . Tonsillectomy  01/26/1973  . Dexa  08/2010    osteopenia    Family History  Problem Relation Age of Onset  . Arthritis Maternal Grandmother   . Hyperlipidemia Mother   . Hyperlipidemia Father   . CAD Mother     CABG  . CAD Father     MI  . Cancer Sister     breast  . Diabetes Neg Hx   . Stroke Neg Hx   . Alzheimer's disease    . Heart attack      Allergies  Allergen Reactions  . Sulfa Antibiotics     Current Outpatient Prescriptions on File Prior to Visit  Medication Sig Dispense Refill  . cholecalciferol (VITAMIN D) 1000 UNITS tablet Take 1,000 Units by mouth daily.    . fluticasone (FLONASE) 50 MCG/ACT nasal spray Place 2 sprays into both nostrils daily as needed. 16 g 0  . benzonatate (TESSALON PERLES) 100 MG capsule Take 1 capsule (100 mg total) by mouth 3 (three) times daily as needed for cough. (Patient not taking: Reported on 05/15/2014)  20 capsule 0   No current facility-administered medications on file prior to visit.    BP 120/74 mmHg  Pulse 66  Temp(Src) 97.7 F (36.5 C) (Oral)  Ht 5' 6.5" (1.689 m)  Wt 192 lb 1.9 oz (87.145 kg)  BMI 30.55 kg/m2  SpO2 98%    Objective:   Physical Exam  Constitutional: She is oriented to person, place, and time. She appears well-developed.  HENT:  Right Ear: Tympanic membrane and ear canal normal.  Left Ear: Tympanic membrane and ear canal normal.  Nose: Right sinus exhibits maxillary sinus tenderness and frontal sinus tenderness. Left sinus exhibits maxillary sinus tenderness and frontal sinus tenderness.  Mouth/Throat: Oropharynx is clear and moist.  Eyes: Conjunctivae are normal. Pupils are equal, round, and reactive to light.  Neck: Neck supple.    Cardiovascular: Normal rate and regular rhythm.   Pulmonary/Chest: Effort normal and breath sounds normal.  Lymphadenopathy:    She has no cervical adenopathy.  Neurological: She is alert and oriented to person, place, and time.  Skin: Skin is warm and dry.  Psychiatric: She has a normal mood and affect.          Assessment & Plan:

## 2014-05-22 ENCOUNTER — Ambulatory Visit (INDEPENDENT_AMBULATORY_CARE_PROVIDER_SITE_OTHER): Payer: Medicare HMO | Admitting: Neurology

## 2014-05-22 DIAGNOSIS — R519 Headache, unspecified: Secondary | ICD-10-CM

## 2014-05-22 DIAGNOSIS — G4733 Obstructive sleep apnea (adult) (pediatric): Secondary | ICD-10-CM | POA: Diagnosis not present

## 2014-05-22 DIAGNOSIS — R0683 Snoring: Secondary | ICD-10-CM

## 2014-05-22 DIAGNOSIS — Z9989 Dependence on other enabling machines and devices: Secondary | ICD-10-CM

## 2014-05-22 DIAGNOSIS — R51 Headache: Secondary | ICD-10-CM

## 2014-05-22 NOTE — Sleep Study (Signed)
Please see the scanned sleep study interpretation located in the Procedure tab within the Chart Review section. 

## 2014-05-29 ENCOUNTER — Telehealth: Payer: Self-pay | Admitting: Neurology

## 2014-05-29 NOTE — Telephone Encounter (Signed)
Pt called stating that she received call from [facility] for ordered cpap supplies but pt states she was told there would be an order placed for a new cpap machine. Please review and advise.

## 2014-05-30 ENCOUNTER — Encounter: Payer: Self-pay | Admitting: Neurology

## 2014-06-12 ENCOUNTER — Telehealth: Payer: Self-pay

## 2014-06-12 DIAGNOSIS — G4733 Obstructive sleep apnea (adult) (pediatric): Secondary | ICD-10-CM

## 2014-06-12 NOTE — Telephone Encounter (Signed)
Called pt and gave sleep study results. Informed pt that the study showed osa and uars and that Dr. Brett Fairy recommended starting at dream station cpap. Pt wishes to proceed but wants to shop around for a good DME and wants [name] to mail her the RX. Pt verbalized understanding to call us for a f/u appt once she has started the cpap for insurance purposes. Pt verbalized understanding.

## 2014-06-26 NOTE — Telephone Encounter (Signed)
Pt called me asking if I would please send her cpap order to [facility] in [place]. Told her that I would do so.

## 2014-09-18 ENCOUNTER — Other Ambulatory Visit: Payer: Self-pay

## 2014-09-18 ENCOUNTER — Ambulatory Visit (INDEPENDENT_AMBULATORY_CARE_PROVIDER_SITE_OTHER): Payer: Medicare HMO | Admitting: Neurology

## 2014-09-18 ENCOUNTER — Encounter: Payer: Self-pay | Admitting: Neurology

## 2014-09-18 VITALS — BP 118/70 | HR 60 | Resp 20 | Ht 65.0 in | Wt 194.0 lb

## 2014-09-18 DIAGNOSIS — G4710 Hypersomnia, unspecified: Secondary | ICD-10-CM

## 2014-09-18 DIAGNOSIS — G4733 Obstructive sleep apnea (adult) (pediatric): Secondary | ICD-10-CM | POA: Diagnosis not present

## 2014-09-18 DIAGNOSIS — E66811 Obesity, class 1: Secondary | ICD-10-CM | POA: Insufficient documentation

## 2014-09-18 DIAGNOSIS — E669 Obesity, unspecified: Secondary | ICD-10-CM | POA: Insufficient documentation

## 2014-09-18 DIAGNOSIS — Z9989 Dependence on other enabling machines and devices: Secondary | ICD-10-CM

## 2014-09-18 NOTE — Progress Notes (Signed)
S[facility]EP [facility]   Provider:  Larey Seat, M D  Referring Provider: Ria Bush, MD Primary Care Physician:  Ria Bush, MD  Chief Complaint  Patient presents with  . Follow-up    cpap, rm 11, alone    HPI:  Lori Shaw is a 69 y.o. female ,seen here as a revisit from Dr. Danise Mina for CPA follow up.   Chief complaint according to patient : none - just here for routine check up .  Lori Shaw underwent a split-night polysomnography on 05-22-14, after she is a current CPAP user had presented again with excessive daytime sleepiness and fatigue breakthrough snoring and nocturia. The machine appear to be malfunctioning turning off in the middle of the night and she needed to requalify for CPAP machine. The baseline part of her split-night polysomnography revealed an AHI of 23.8 and an RDI of 42.9. Supine AHI was 32.3 she did not sleep in REM for the first diagnostic part of the study. She had no significant oxygen desaturations. This was important as the patient had also presented this morning headaches. The titration begun at 5 cm water CPAP and advanced to 9 cm but she seemed to have done very well with out EPR. Her AHI became 0.8 and she was using a dream wear mask with a ResMED machine.  As newlyweds she and her husband went to a trip to [place] but unfortunately she forgot the power cord , logging the CPAP overseas. I will review her compliance data up to August 3 and it is clear that the patient is highly compliant and use the machine 93% of the time for the first 30 days of use. Average user time changed due to the icelandic break / interruption. The machine is set at 9 cm water pressure with 2 cm EPR, the  residual AHI is 1.8. This is a very good resolution of her sleep apnea and she has noted no nocturia no morning headaches and is more alert and at night sound asleep. She still endorsed 17 points on the Epworth Sleepiness Scale . The patient still is excessively  daytime sleepy. She states that she falls asleep very promptly once she puts her CPAP on and that her husband, who likes watching TV in the bedroom does not keep her  awake with his habits. She goes to bed between 9 and 9:30 PM and sleeps usually through to 6 AM. She wakes more restored and refreshed now again without headaches and the dry mouth is no longer an issue.  I would like for Lori Shaw to undergo an HLA narcolepsy test. Her sleepiness is out of proportion to the residual apnea as she presents with, looking for another reason for her sleepiness besides obstructive sleep apnea. She can take up 10 minute power nap and feels great again she reports. At nighttime she does not recall any dreams no nightmares and no vivid dreams especially. She does report occasional waking up being unable to open her eyes being paralyzed. She has never experienced cataplexy due to anger, startling or loud laughing.   Sleep medical history and family sleep history:  Her brothers have CPAP machine, sister that snores, but was not tested, both parents had witnessed apnea, untreated - both died of heart disease Social history: remarried. Her husband has MS.  2 beverages with caffeine a day  , rare alcohol drinker, former smoker.   Review of Systems: Out of a complete 14 system review, the patient complains of only the following  symptoms, and all other reviewed systems are negative. See above   Epworth score 17.  Social History   Social History  . Marital Status: Married    Spouse Name: N/A  . Number of Children: 2  . Years of Education: 1.5   Occupational History  . retired    Social History Main Topics  . Smoking status: Former Smoker -- 2.00 packs/day for 20 years    Types: Cigarettes    Quit date: 01/26/1978  . Smokeless tobacco: Never Used  . Alcohol Use: No  . Drug Use: No  . Sexual Activity: Not on file   Other Topics Concern  . Not on file   Social History Narrative   Patient  daughter lives with her.Lives with daughter, 1 dog   Occupation: retired, has worked in Chief Strategy Officer, also Forensic psychologist   Edu: 1.5 yrs college   Activity: square dancing   Diet: good water, fruits/vegetables daily   Patient drinks two cups of caffeine daily.   Remarried.      Family History  Problem Relation Age of Onset  . Arthritis Maternal Grandmother   . Hyperlipidemia Mother   . Hyperlipidemia Father   . CAD Mother     CABG  . CAD Father     MI  . Cancer Sister     breast  . Diabetes Neg Hx   . Stroke Neg Hx   . Alzheimer's disease    . Heart attack      Past Medical History  Diagnosis Date  . HLD (hyperlipidemia)   . OSA on CPAP 2006    averages 9-10 cm H2O ([name])  . Osteopenia 08/2010    T -1.4 at L femur neck    Past Surgical History  Procedure Laterality Date  . Abdominal hysterectomy  01/27/1983    for menometrorrhagia, one ovary remained  . Tonsillectomy  01/26/1973  . Dexa  08/2010    osteopenia    Current Outpatient Prescriptions  Medication Sig Dispense Refill  . cholecalciferol (VITAMIN D) 1000 UNITS tablet Take 1,000 Units by mouth daily.    . fluticasone (FLONASE) 50 MCG/ACT nasal spray Place 2 sprays into both nostrils daily as needed. 16 g 0   No current facility-administered medications for this visit.    Allergies as of 09/18/2014 - Review Complete 09/18/2014  Allergen Reaction Noted  . Sulfa antibiotics  01/17/2013    Vitals: BP 118/70 mmHg  Pulse 60  Resp 20  Ht _0  (1.651 m)  Wt 194 lb (87.998 kg)  BMI 32.28 kg/m2 Last Weight:  Wt Readings from Last 1 Encounters:  09/18/14 194 lb (87.998 kg)   LJQ:GBEE mass index is 32.28 kg/(m^2).     Last Height:   Ht Readings from Last 1 Encounters:  09/18/14 _1  (1.651 m)    Physical exam:  General: The patient is awake, alert and appears not in acute distress. The patient is well groomed. Head: Normocephalic, atraumatic. Neck is supple. Mallampati 3   neck  circumference:15. Nasal airflow restricted , TMJ is not  evident . Retrognathia is not seen.  Cardiovascular:  Regular rate and rhythm , without  murmurs or carotid bruit, and without distended neck veins. Respiratory: Lungs are clear to auscultation. Skin:  Without evidence of edema, or rash Trunk: BMI is 33. The patient's posture is erect    Neurologic exam : The patient is awake and alert, oriented to place and time.   Attention span & concentration ability appears  normal.  Speech is fluent, without dysarthria, dysphonia or aphasia.  Mood and affect are appropriate.  Cranial nerves: Pupils are equal and briskly reactive to light. Extraocular movements  in vertical and horizontal planes intact and without nystagmus. Visual fields by finger perimetry are intact. Hearing to finger rub intact. Facial sensation intact to fine touch. Facial motor strength is symmetric and tongue and uvula move midline. Shoulder shrug was symmetrical.   Motor exam:  Normal tone, muscle bulk and symmetric strength in all extremities.  Sensory:  Fine touch, pinprick and vibration were tested in all extremities.  Proprioception tested in the upper extremities was normal.  Coordination: Rapid alternating movements in the fingers/hands was normal.  Finger-to-nose maneuver  normal without evidence of ataxia, dysmetria or tremor.  Deep tendon reflexes: in the  upper and lower extremities are symmetric and intact. Babinski maneuver response is  downgoing.  The patient was advised of the nature of the diagnosed sleep disorder , the treatment options and risks for general a health and wellness arising from not treating the condition.  I spent more than 20 minutes of face to face time with the patient. Greater than 50% of time was spent in counseling and coordination of care. We have discussed the diagnosis and differential and I answered the patient's questions.     Assessment:  After physical and neurologic  examination, review of laboratory studies,  Personal review of imaging studies, reports of other /same  Imaging studies ,  Results of polysomnography/ neurophysiology testing and pre-existing records as far as provided in visit., my assessment is   1) obstructive sleep apnea confirmed in a polysomnography by split night protocol on 4-20 6-16. The patient had an AHI of 23.8 and an RDI of 42.9. This verifies her history of thunderous snoring. She had many more snoring related events than actual true apneas. CPAP was titrated to 9 cm water pressure and alleviated the AHI. Compliance download obtained in office shows an AHI of 1.8.   2)Epworth score still elevated at 17 revealing a persistent daytime hypersomnia. I will order an HLA narcolepsy test.   3)obesity is main risk factor. Patient well aware but frustrated with diet results. Offered medical diet.    Lori Partridge Renata Gambino MD  09/18/2014   CC: Ria Bush, [place] [address] [place], [place] [address]

## 2014-09-18 NOTE — Progress Notes (Signed)
HLA narcolepsy test blood drawn and sent to [facility]. Pt understands that we will call her for results.

## 2014-09-18 NOTE — Patient Instructions (Addendum)
Hypersomnia Hypersomnia usually brings recurrent episodes of excessive daytime sleepiness or prolonged nighttime sleep. It is different than feeling tired due to lack of or interrupted sleep at night. People with hypersomnia are compelled to nap repeatedly during the day. This is often at inappropriate times such as:  At work.  During a meal.  In conversation. These daytime naps usually provide no relief. This disorder typically affects adolescents and young adults. CAUSES  This condition may be caused by:  Another sleep disorder (such as narcolepsy or sleep apnea).  Dysfunction of the autonomic nervous system.  Drug or alcohol abuse.  A physical problem, such as:  A tumor.  Head trauma. This is damage caused by an accident.  Injury to the central nervous system.  Certain medications, or medicine withdrawal.  Medical conditions may contribute to the disorder, including:  Multiple sclerosis.  Depression.  Encephalitis.  Epilepsy.  Obesity.  Some people appear to have a genetic predisposition to this disorder. In others, there is no known cause. SYMPTOMS   Patients often have difficulty waking from a long sleep. They may feel dazed or confused.  Other symptoms may include:  Anxiety.  Increased irritation (inflammation).  Decreased energy.  Restlessness.  Slow thinking.  Slow speech.  Loss of appetite.  Hallucinations.  Memory difficulty.  Tremors, Tics.  Some patients lose the ability to function in family, social, occupational, or other settings. TREATMENT  Treatment is symptomatic in nature. Stimulants and other drugs may be used to treat this disorder. Changes in behavior may help. For example, avoid night work and social activities that delay bed time. Changes in diet may offer some relief. Patients should avoid alcohol and caffeine. PROGNOSIS  The likely outcome (prognosis) for persons with hypersomnia depends on the cause of the disorder.  The disorder itself is not life threatening. But it can have serious consequences. For example, automobile accidents can be caused by falling asleep while driving. The attacks usually continue indefinitely. Document Released: 01/02/2002 Document Revised: 04/06/2011 Document Reviewed: 12/07/2007 [facility] Patient Information 2015 [place], [place]. This information is not intended to replace advice given to you by your health care provider. Make sure you discuss any questions you have with your health care provider.    one year RV with Dr Brett Fairy, please call us if you need to be seen earlier.  HLA results will be called by phone, about 10 days after blood draw/

## 2014-09-27 ENCOUNTER — Telehealth: Payer: Self-pay

## 2014-09-27 NOTE — Telephone Encounter (Signed)
Called and spoke to pt regarding her HLA result. Advised pt that it was negative (normal). Pt reports that she is starting to feel better and thinks that it was just jet lag. I advised pt to call us back with any further questions or concerns. Pt verbalized understanding.

## 2014-11-02 DIAGNOSIS — G4733 Obstructive sleep apnea (adult) (pediatric): Secondary | ICD-10-CM | POA: Diagnosis not present

## 2014-11-10 DIAGNOSIS — G4733 Obstructive sleep apnea (adult) (pediatric): Secondary | ICD-10-CM | POA: Diagnosis not present

## 2014-12-11 DIAGNOSIS — G4733 Obstructive sleep apnea (adult) (pediatric): Secondary | ICD-10-CM | POA: Diagnosis not present

## 2014-12-14 ENCOUNTER — Ambulatory Visit (INDEPENDENT_AMBULATORY_CARE_PROVIDER_SITE_OTHER): Payer: Medicare HMO | Admitting: Family Medicine

## 2014-12-14 ENCOUNTER — Encounter: Payer: Self-pay | Admitting: Family Medicine

## 2014-12-14 VITALS — BP 110/72 | HR 68 | Temp 98.1°F | Wt 198.5 lb

## 2014-12-14 DIAGNOSIS — Z23 Encounter for immunization: Secondary | ICD-10-CM | POA: Diagnosis not present

## 2014-12-14 DIAGNOSIS — G5701 Lesion of sciatic nerve, right lower limb: Secondary | ICD-10-CM

## 2014-12-14 DIAGNOSIS — H5203 Hypermetropia, bilateral: Secondary | ICD-10-CM | POA: Diagnosis not present

## 2014-12-14 DIAGNOSIS — G5702 Lesion of sciatic nerve, left lower limb: Secondary | ICD-10-CM | POA: Insufficient documentation

## 2014-12-14 DIAGNOSIS — G5700 Lesion of sciatic nerve, unspecified lower limb: Secondary | ICD-10-CM

## 2014-12-14 HISTORY — DX: Lesion of sciatic nerve, unspecified lower limb: G57.00

## 2014-12-14 MED ORDER — NAPROXEN 500 MG PO TABS: ORAL_TABLET | ORAL | Status: DC

## 2014-12-14 NOTE — Progress Notes (Signed)
Pre visit review using our clinic review tool, if applicable. No additional management support is needed unless otherwise documented below in the visit note. 

## 2014-12-14 NOTE — Progress Notes (Signed)
   BP 110/72 mmHg  Pulse 68  Temp(Src) 98.1 F (36.7 C) (Oral)  Wt 198 lb 8 oz (90.039 kg)   CC: R hip pain  Subjective:    Patient ID: Lori Shaw, female    DOB: 18-Jul-1945, 69 y.o.   MRN: [id number]  HPI: Lori Shaw is a 69 y.o. female presenting on 12/14/2014 for Hip Pain   Several month history of hip pain that may have started after working in yard - noticed large bruise R lateral buttock which has since resolved. Intermittent issue. Worse pain going up stairs and with transitions. Leg feels weak. Denies inciting trauma/fall or known injury.   Has treated with aleve which helps.   No back or groin or knee pain. No shooting pain down leg. No numbness of leg. No instability or locking.  Relevant past medical, surgical, family and social history reviewed and updated as indicated. Interim medical history since our last visit reviewed. Allergies and medications reviewed and updated. Current Outpatient Prescriptions on File Prior to Visit  Medication Sig  . cholecalciferol (VITAMIN D) 1000 UNITS tablet Take 1,000 Units by mouth daily.  . fluticasone (FLONASE) 50 MCG/ACT nasal spray Place 2 sprays into both nostrils daily as needed.   No current facility-administered medications on file prior to visit.    Review of Systems Per HPI unless specifically indicated in ROS section     Objective:    BP 110/72 mmHg  Pulse 68  Temp(Src) 98.1 F (36.7 C) (Oral)  Wt 198 lb 8 oz (90.039 kg)  Wt Readings from Last 3 Encounters:  12/14/14 198 lb 8 oz (90.039 kg)  09/18/14 194 lb (87.998 kg)  05/15/14 192 lb 1.9 oz (87.145 kg)   Body mass index is 33.03 kg/(m^2).  Physical Exam  Constitutional: She appears well-developed and well-nourished. No distress.  Musculoskeletal: She exhibits no edema.  No pain midline spine No paraspinous mm tenderness Neg SLR bilaterally - hamstring pain/tightness on right noted. No pain with int/ext rotation at hip. Neg FABER. No pain at SIJ,  GTB bilaterally. R sciatic notch discomfort to palpation  FROM hip and knee flexion, no pain with testing hamstring contraction against resistance  Neurological: She is alert. She has normal strength. No cranial nerve deficit or sensory deficit. She displays a negative Romberg sign.  Reflex Scores:      Patellar reflexes are 2+ on the right side and 2+ on the left side. 5/5 strength BLE Sensation intact  Skin: Skin is warm and dry.  Nursing note and vitals reviewed.     Assessment & Plan:   Problem List Items Addressed This Visit    Piriformis syndrome - Primary    Anticipate R piriformis syndrome/sciatica. Discussed with patient. Treat with naprosyn, ice/heat, and exercises provided today. Update if not improving with treatment to consider rehab referral. Pt agrees with plan. No pain with testing hamstrings against resistance points against hamstring strain.        Other Visit Diagnoses    Need for influenza vaccination        Relevant Orders    Flu Vaccine QUAD 36+ mos PF IM (Fluarix & Fluzone Quad PF) (Completed)        Follow up plan: Return if symptoms worsen or fail to improve.

## 2014-12-14 NOTE — Patient Instructions (Addendum)
Flu shot today. I think you have piriformis syndrome. Treat with naprosyn course (anti inflammatory), ice/heating pad, and exercises provided today. Update if not improving with treatment - would consider physical therapy.

## 2014-12-14 NOTE — Assessment & Plan Note (Addendum)
Anticipate R piriformis syndrome/sciatica. Discussed with patient. Treat with naprosyn, ice/heat, and exercises provided today. Update if not improving with treatment to consider rehab referral. Pt agrees with plan. No pain with testing hamstrings against resistance points against hamstring strain.

## 2014-12-17 DIAGNOSIS — H02403 Unspecified ptosis of bilateral eyelids: Secondary | ICD-10-CM | POA: Diagnosis not present

## 2014-12-17 DIAGNOSIS — H02413 Mechanical ptosis of bilateral eyelids: Secondary | ICD-10-CM | POA: Diagnosis not present

## 2014-12-28 DIAGNOSIS — Z0101 Encounter for examination of eyes and vision with abnormal findings: Secondary | ICD-10-CM | POA: Diagnosis not present

## 2015-01-10 DIAGNOSIS — G4733 Obstructive sleep apnea (adult) (pediatric): Secondary | ICD-10-CM | POA: Diagnosis not present

## 2015-02-10 DIAGNOSIS — G4733 Obstructive sleep apnea (adult) (pediatric): Secondary | ICD-10-CM | POA: Diagnosis not present

## 2015-02-18 DIAGNOSIS — H02834 Dermatochalasis of left upper eyelid: Secondary | ICD-10-CM | POA: Diagnosis not present

## 2015-02-18 DIAGNOSIS — H02831 Dermatochalasis of right upper eyelid: Secondary | ICD-10-CM | POA: Diagnosis not present

## 2015-02-18 DIAGNOSIS — H11433 Conjunctival hyperemia, bilateral: Secondary | ICD-10-CM | POA: Diagnosis not present

## 2015-02-18 DIAGNOSIS — H02413 Mechanical ptosis of bilateral eyelids: Secondary | ICD-10-CM | POA: Diagnosis not present

## 2015-03-05 ENCOUNTER — Encounter: Payer: Self-pay | Admitting: Family Medicine

## 2015-03-05 ENCOUNTER — Ambulatory Visit (INDEPENDENT_AMBULATORY_CARE_PROVIDER_SITE_OTHER): Payer: Medicare HMO | Admitting: Family Medicine

## 2015-03-05 ENCOUNTER — Telehealth: Payer: Self-pay | Admitting: *Deleted

## 2015-03-05 VITALS — BP 124/76 | HR 76 | Temp 97.7°F | Wt 196.5 lb

## 2015-03-05 DIAGNOSIS — S81802A Unspecified open wound, left lower leg, initial encounter: Secondary | ICD-10-CM | POA: Diagnosis not present

## 2015-03-05 DIAGNOSIS — J019 Acute sinusitis, unspecified: Secondary | ICD-10-CM | POA: Diagnosis not present

## 2015-03-05 MED ORDER — BENZONATATE 200 MG PO CAPS: 200.0000 mg | ORAL_CAPSULE | Freq: Three times a day (TID) | ORAL | Status: DC | PRN

## 2015-03-05 NOTE — Assessment & Plan Note (Signed)
Anticipate viral process given short duration. Supportive care and update if persistent symptoms Pt agrees with plan.

## 2015-03-05 NOTE — Patient Instructions (Addendum)
For leg wound - use vaseline daily for next few weeks - should continue to heal well.  Watch for any new bruising. You have a viral upper respiratory infection. Antibiotics are not needed for this.  Viral infections usually take 7-10 days to resolve.  The cough can last a few weeks to go away. Use medication as prescribed: tessalon perls for cough. Push fluids and plenty of rest. Watch for fever >101, worsening productive cough, or worsening after initial improvement.  Call clinic with questions.  Good to see you today. I hope you start feeling better soon.

## 2015-03-05 NOTE — Progress Notes (Signed)
BP 124/76 mmHg  Pulse 76  Temp(Src) 97.7 F (36.5 C) (Oral)  Wt 196 lb 8 oz (89.132 kg)   CC: congestion  Subjective:    Patient ID: Lori Shaw, female    DOB: 30-Jun-1945, 70 y.o.   MRN: [id number]  HPI: Lori Shaw is a 70 y.o. female presenting on 03/05/2015 for Sinusitis and Wound Check   2 d headache, facial congestion, dry cough, L nare plugged. Trouble with CPAP at night time. Bilateral earache and sore throat with PNdrainage. Started with RN.   No fevers, dyspnea or wheezing.  + sick contacts at church  No smokers at home. No h/o asthma/COPD. Hasn't tried anything for this. Taking flonase daily as well as nyquil last night.  4 wk h/o sore on right lateral leg, treating with neosporin without improvement. Not bleeding or draining. Denies inciting injury, trauma, bug bites to leg.   Relevant past medical, surgical, family and social history reviewed and updated as indicated. Interim medical history since our last visit reviewed. Allergies and medications reviewed and updated. Current Outpatient Prescriptions on File Prior to Visit  Medication Sig  . cholecalciferol (VITAMIN D) 1000 UNITS tablet Take 1,000 Units by mouth daily.  . fluticasone (FLONASE) 50 MCG/ACT nasal spray Place 2 sprays into both nostrils daily as needed.   No current facility-administered medications on file prior to visit.    Review of Systems Per HPI unless specifically indicated in ROS section     Objective:    BP 124/76 mmHg  Pulse 76  Temp(Src) 97.7 F (36.5 C) (Oral)  Wt 196 lb 8 oz (89.132 kg)  Wt Readings from Last 3 Encounters:  03/05/15 196 lb 8 oz (89.132 kg)  12/14/14 198 lb 8 oz (90.039 kg)  09/18/14 194 lb (87.998 kg)    Physical Exam  Constitutional: She appears well-developed and well-nourished. No distress.  HENT:  Head: Normocephalic and atraumatic.  Right Ear: Hearing, tympanic membrane, external ear and ear canal normal.  Left Ear: Hearing, tympanic  membrane, external ear and ear canal normal.  Nose: Mucosal edema (nasal mucosal congestion) and rhinorrhea present. Right sinus exhibits no maxillary sinus tenderness and no frontal sinus tenderness. Left sinus exhibits no maxillary sinus tenderness and no frontal sinus tenderness.  Mouth/Throat: Uvula is midline, oropharynx is clear and moist and mucous membranes are normal. No oropharyngeal exudate, posterior oropharyngeal edema, posterior oropharyngeal erythema or tonsillar abscesses.  Eyes: Conjunctivae and EOM are normal. Pupils are equal, round, and reactive to light. No scleral icterus.  Neck: Normal range of motion. Neck supple.  Cardiovascular: Normal rate, regular rhythm, normal heart sounds and intact distal pulses.   No murmur heard. Pulmonary/Chest: Effort normal and breath sounds normal. No respiratory distress. She has no wheezes. She has no rales.  Lymphadenopathy:    She has no cervical adenopathy.  Skin: Skin is warm and dry. No rash noted.  Scab L lower lateral leg Faint bruising anterior shin L leg  Nursing note and vitals reviewed.     Assessment & Plan:   Problem List Items Addressed This Visit    Leg wound, left    Dry persistent scab without evidence of secondary infection. Reviewed with patient and reassured - recommended vaseline daily to speed healing.      Acute sinusitis - Primary    Anticipate viral process given short duration. Supportive care and update if persistent symptoms Pt agrees with plan.          Follow up plan: Return  if symptoms worsen or fail to improve.

## 2015-03-05 NOTE — Progress Notes (Signed)
Pre visit review using our clinic review tool, if applicable. No additional management support is needed unless otherwise documented below in the visit note. 

## 2015-03-05 NOTE — Assessment & Plan Note (Signed)
Dry persistent scab without evidence of secondary infection. Reviewed with patient and reassured - recommended vaseline daily to speed healing.

## 2015-03-05 NOTE — Telephone Encounter (Signed)
FYI: Tessalon perls were not sent in for patient at her visit. [facility] called and needed Rx. Called in 200 mg caps 1 TID PRN #[id number].

## 2015-03-13 DIAGNOSIS — G4733 Obstructive sleep apnea (adult) (pediatric): Secondary | ICD-10-CM | POA: Diagnosis not present

## 2015-04-01 ENCOUNTER — Other Ambulatory Visit: Payer: Self-pay | Admitting: Ophthalmology

## 2015-04-01 DIAGNOSIS — D485 Neoplasm of uncertain behavior of skin: Secondary | ICD-10-CM | POA: Diagnosis not present

## 2015-04-01 DIAGNOSIS — H02413 Mechanical ptosis of bilateral eyelids: Secondary | ICD-10-CM | POA: Diagnosis not present

## 2015-04-01 DIAGNOSIS — D2211 Melanocytic nevi of right eyelid, including canthus: Secondary | ICD-10-CM | POA: Diagnosis not present

## 2015-04-01 DIAGNOSIS — D2311 Other benign neoplasm of skin of right eyelid, including canthus: Secondary | ICD-10-CM | POA: Diagnosis not present

## 2015-04-01 DIAGNOSIS — H02834 Dermatochalasis of left upper eyelid: Secondary | ICD-10-CM | POA: Diagnosis not present

## 2015-04-01 DIAGNOSIS — H02403 Unspecified ptosis of bilateral eyelids: Secondary | ICD-10-CM | POA: Diagnosis not present

## 2015-04-01 DIAGNOSIS — H02831 Dermatochalasis of right upper eyelid: Secondary | ICD-10-CM | POA: Diagnosis not present

## 2015-04-01 DIAGNOSIS — L821 Other seborrheic keratosis: Secondary | ICD-10-CM | POA: Diagnosis not present

## 2015-04-10 DIAGNOSIS — G4733 Obstructive sleep apnea (adult) (pediatric): Secondary | ICD-10-CM | POA: Diagnosis not present

## 2015-05-11 DIAGNOSIS — G4733 Obstructive sleep apnea (adult) (pediatric): Secondary | ICD-10-CM | POA: Diagnosis not present

## 2015-05-14 DIAGNOSIS — R69 Illness, unspecified: Secondary | ICD-10-CM | POA: Diagnosis not present

## 2015-05-16 DIAGNOSIS — R69 Illness, unspecified: Secondary | ICD-10-CM | POA: Diagnosis not present

## 2015-05-23 ENCOUNTER — Ambulatory Visit: Payer: Medicare HMO

## 2015-06-02 ENCOUNTER — Other Ambulatory Visit: Payer: Self-pay | Admitting: Family Medicine

## 2015-06-02 DIAGNOSIS — Z1159 Encounter for screening for other viral diseases: Secondary | ICD-10-CM

## 2015-06-02 DIAGNOSIS — E669 Obesity, unspecified: Secondary | ICD-10-CM

## 2015-06-02 DIAGNOSIS — E66811 Obesity, class 1: Secondary | ICD-10-CM

## 2015-06-02 DIAGNOSIS — E785 Hyperlipidemia, unspecified: Secondary | ICD-10-CM

## 2015-06-06 ENCOUNTER — Other Ambulatory Visit (INDEPENDENT_AMBULATORY_CARE_PROVIDER_SITE_OTHER): Payer: Medicare HMO

## 2015-06-06 ENCOUNTER — Ambulatory Visit (INDEPENDENT_AMBULATORY_CARE_PROVIDER_SITE_OTHER): Payer: Medicare HMO

## 2015-06-06 ENCOUNTER — Telehealth: Payer: Self-pay

## 2015-06-06 ENCOUNTER — Other Ambulatory Visit: Payer: Self-pay | Admitting: Family Medicine

## 2015-06-06 VITALS — BP 122/80 | HR 57 | Temp 97.8°F | Ht 65.0 in | Wt 198.0 lb

## 2015-06-06 DIAGNOSIS — E669 Obesity, unspecified: Secondary | ICD-10-CM | POA: Diagnosis not present

## 2015-06-06 DIAGNOSIS — E785 Hyperlipidemia, unspecified: Secondary | ICD-10-CM | POA: Diagnosis not present

## 2015-06-06 DIAGNOSIS — Z0000 Encounter for general adult medical examination without abnormal findings: Secondary | ICD-10-CM

## 2015-06-06 DIAGNOSIS — Z1159 Encounter for screening for other viral diseases: Secondary | ICD-10-CM

## 2015-06-06 DIAGNOSIS — Z23 Encounter for immunization: Secondary | ICD-10-CM

## 2015-06-06 LAB — COMPREHENSIVE METABOLIC PANEL
ALBUMIN: 4.5 g/dL (ref 3.5–5.2)
ALT: 21 U/L (ref 0–35)
AST: 19 U/L (ref 0–37)
Alkaline Phosphatase: 67 U/L (ref 39–117)
BUN: 12 mg/dL (ref 6–23)
CHLORIDE: 104 meq/L (ref 96–112)
CO2: 26 meq/L (ref 19–32)
CREATININE: 0.83 mg/dL (ref 0.40–1.20)
Calcium: 9.5 mg/dL (ref 8.4–10.5)
GFR: 72.49 mL/min (ref >60.00)
Glucose, Bld: 103 mg/dL — ABNORMAL HIGH (ref 70–99)
POTASSIUM: 4 meq/L (ref 3.5–5.1)
SODIUM: 139 meq/L (ref 135–145)
Total Bilirubin: 0.7 mg/dL (ref 0.2–1.2)
Total Protein: 6.9 g/dL (ref 6.0–8.3)

## 2015-06-06 LAB — LIPID PANEL
CHOL/HDL RATIO: 6
Cholesterol: 246 mg/dL — ABNORMAL HIGH (ref 0–200)
HDL: 38.3 mg/dL — AB (ref >39.00)
NonHDL: 207.93
Triglycerides: 244 mg/dL — ABNORMAL HIGH (ref 0.0–149.0)
VLDL: 48.8 mg/dL — AB (ref 0.0–40.0)

## 2015-06-06 LAB — LDL CHOLESTEROL, DIRECT: LDL DIRECT: 163 mg/dL

## 2015-06-06 LAB — TSH: TSH: 5.79 u[IU]/mL — AB (ref 0.35–4.50)

## 2015-06-06 NOTE — Progress Notes (Signed)
PCP notes:   Health maintenance: Hep C screening completed. PCV13 administered.   Abnormal screenings: Hearing-failed. Please assess pt at next appt.   Next appt: 06/20/15 @ 1030AM

## 2015-06-06 NOTE — Patient Instructions (Signed)
Lori Shaw , Thank you for taking time to come for your Medicare Wellness Visit. I appreciate your ongoing commitment to your health goals. Please review the following plan we discussed and let me know if I can assist you in the future.   These are the goals we discussed: Goals    . Weight < 171 lb (77.565 kg)     Target weight is 170 lbs. Starting 06/06/2015, I will continue strength training for at least 60 min 3 days per week .        This is a list of the screening recommended for you and due dates:  Health Maintenance  Topic Date Due  . Flu Shot  08/27/2015  . Mammogram  10/14/2015  . Tetanus Vaccine  03/31/2016  . Colon Cancer Screening  01/26/2018  . DTaP/Tdap/Td vaccine  Completed  . DEXA scan (bone density measurement)  Completed  . Shingles Vaccine  Completed  .  Hepatitis C: One time screening is recommended by Center for Disease Control  (CDC) for  adults born from [birth year] through 1964.   Completed  . Pneumonia vaccines  Completed   Preventive Care for Adults  A healthy lifestyle and preventive care can promote health and wellness. Preventive health guidelines for adults include the following key practices.  . A routine yearly physical is a good way to check with your health care provider about your health and preventive screening. It is a chance to share any concerns and updates on your health and to receive a thorough exam.  . Visit your dentist for a routine exam and preventive care every 6 months. Brush your teeth twice a day and floss once a day. Good oral hygiene prevents tooth decay and gum disease.  . The frequency of eye exams is based on your age, health, family medical history, use  of contact lenses, and other factors. Follow your health care provider's ecommendations for frequency of eye exams.  . Eat a healthy diet. Foods like vegetables, fruits, whole grains, low-fat dairy products, and lean protein foods contain the nutrients you need without too many  calories. Decrease your intake of foods high in solid fats, added sugars, and salt. Eat the right amount of calories for you. Get information about a proper diet from your health care provider, if necessary.  . Regular physical exercise is one of the most important things you can do for your health. Most adults should get at least 150 minutes of moderate-intensity exercise (any activity that increases your heart rate and causes you to sweat) each week. In addition, most adults need muscle-strengthening exercises on 2 or more days a week.  Silver Sneakers may be a benefit available to you. To determine eligibility, you may visit the website: www.silversneakers.com or contact program at 334-867-6727 Mon-Fri between 8AM-8PM.   . Maintain a healthy weight. The body mass index (BMI) is a screening tool to identify possible weight problems. It provides an estimate of body fat based on height and weight. Your health care provider can find your BMI and can help you achieve or maintain a healthy weight.   For adults 20 years and older: ? A BMI below 18.5 is considered underweight. ? A BMI of 18.5 to 24.9 is normal. ? A BMI of 25 to 29.9 is considered overweight. ? A BMI of 30 and above is considered obese.   . Maintain normal blood lipids and cholesterol levels by exercising and minimizing your intake of saturated fat. Eat a balanced diet  with plenty of fruit and vegetables. Blood tests for lipids and cholesterol should begin at age 48 and be repeated every 5 years. If your lipid or cholesterol levels are high, you are over 50, or you are at high risk for heart disease, you may need your cholesterol levels checked more frequently. Ongoing high lipid and cholesterol levels should be treated with medicines if diet and exercise are not working.  . If you smoke, find out from your health care provider how to quit. If you do not use tobacco, please do not start.  . If you choose to drink alcohol, please do  not consume more than 2 drinks per day. One drink is considered to be 12 ounces (355 mL) of beer, 5 ounces (148 mL) of wine, or 1.5 ounces (44 mL) of liquor.  . If you are 21-59 years old, ask your health care provider if you should take aspirin to prevent strokes.  . Use sunscreen. Apply sunscreen liberally and repeatedly throughout the day. You should seek shade when your shadow is shorter than you. Protect yourself by wearing long sleeves, pants, a wide-brimmed hat, and sunglasses year round, whenever you are outdoors.  . Once a month, do a whole body skin exam, using a mirror to look at the skin on your back. Tell your health care provider of new moles, moles that have irregular borders, moles that are larger than a pencil eraser, or moles that have changed in shape or color.

## 2015-06-06 NOTE — Progress Notes (Signed)
Pre visit review using our clinic review tool, if applicable. No additional management support is needed unless otherwise documented below in the visit note. 

## 2015-06-06 NOTE — Telephone Encounter (Signed)
Attempted to reach pt to discuss lab requests for Vit B-12 and Iron. Dr. Danise Mina has verbally approved labs. Before ordering labs, pt will need to be told about potential charges.

## 2015-06-06 NOTE — Progress Notes (Signed)
Subjective:   Pheona Montreuil is a 70 y.o. female who presents for Medicare Annual (Subsequent) preventive examination.  Review of Systems:  N/A Cardiac Risk Factors include: advanced age (>5men, >97 women);dyslipidemia;obesity (BMI >30kg/m2)     Objective:     Vitals: BP 122/80 mmHg  Pulse 57  Temp(Src) 97.8 F (36.6 C) (Oral)  Ht 5\' 5"  (1.651 m)  Wt 198 lb (89.812 kg)  BMI 32.95 kg/m2  SpO2 99%  Body mass index is 32.95 kg/(m^2).   Tobacco History  Smoking status  . Former Smoker -- 2.00 packs/day for 20 years  . Types: Cigarettes  . Quit date: 01/26/1978  Smokeless tobacco  . Never Used     Counseling given: No   Past Medical History  Diagnosis Date  . HLD (hyperlipidemia)   . OSA on CPAP 2006, 04/2014    AHI of 23.8 and an RDI of 42.9, on CPAP 9 cm H2O ([place])  . Osteopenia 08/2010    T -1.4 at L femur neck   Past Surgical History  Procedure Laterality Date  . Abdominal hysterectomy  01/27/1983    for menometrorrhagia, one ovary remained  . Tonsillectomy  01/26/1973  . Dexa  08/2010    osteopenia   Family History  Problem Relation Age of Onset  . Arthritis Maternal Grandmother   . Hyperlipidemia Mother   . Hyperlipidemia Father   . CAD Mother     CABG  . CAD Father     MI  . Cancer Sister     breast  . Diabetes Neg Hx   . Stroke Neg Hx   . Alzheimer's disease    . Heart attack     History  Sexual Activity  . Sexual Activity: Yes    Outpatient Encounter Prescriptions as of 06/06/2015  Medication Sig  . fluticasone (FLONASE) 50 MCG/ACT nasal spray Place 2 sprays into both nostrils daily as needed.  . cholecalciferol (VITAMIN D) 1000 UNITS tablet Take 1,000 Units by mouth daily. Reported on 06/06/2015  . [DISCONTINUED] benzonatate (TESSALON) 200 MG capsule Take 1 capsule (200 mg total) by mouth 3 (three) times daily as needed for cough.   No facility-administered encounter medications on file as of 06/06/2015.    Activities of Daily  Living In your present state of health, do you have any difficulty performing the following activities: 06/06/2015  Hearing? N  Vision? N  Difficulty concentrating or making decisions? N  Walking or climbing stairs? N  Dressing or bathing? N  Doing errands, shopping? N  Preparing Food and eating ? N  Using the Toilet? N  In the past six months, have you accidently leaked urine? N  Do you have problems with loss of bowel control? N  Managing your Medications? N  Managing your Finances? N  Housekeeping or managing your Housekeeping? N    Patient Care Team: Ria Bush, MD as PCP - General (Family Medicine) Solon Augusta, MD as Attending Physician (Ophthalmology) Elbert Ewings, DDS as Consulting Physician (Dentistry)    Assessment:     Hearing Screening   125Hz  250Hz  500Hz  1000Hz  2000Hz  4000Hz  8000Hz   Right ear:   0 0 40 40   Left ear:   40 40 40 40     Exercise Activities and Dietary recommendations Current Exercise Habits: Structured exercise class, Type of exercise: strength training/weights, Time (Minutes): 60, Frequency (Times/Week): 3, Weekly Exercise (Minutes/Week): 180, Intensity: Moderate, Exercise limited by: None identified  Goals    . Weight < 171  lb (77.565 kg)     Target weight is 170 lbs. Starting 06/06/2015, I will continue strength training for at least 60 min 3 days per week .       Fall Risk Fall Risk  06/06/2015 05/04/2013  Falls in the past year? No No   Depression Screen PHQ 2/9 Scores 06/06/2015 05/04/2013  PHQ - 2 Score 0 0     Cognitive Testing MMSE - Mini Mental State Exam 06/06/2015  Orientation to time 5  Orientation to Place 5  Registration 3  Attention/ Calculation 0  Recall 3  Language- name 2 objects 0  Language- repeat 1  Language- follow 3 step command 3  Language- read & follow direction 0  Write a sentence 0  Copy design 0  Total score 20   PLEASE NOTE: A Mini-Cog screen was completed. Maximum score is 20. A value of 0  denotes this part of Folstein MMSE was not completed or the patient failed this part of the Mini-Cog screening.   Mini-Cog Screening Orientation to Time - Max 5 pts Orientation to Place - Max 5 pts Registration - Max 3 pts Recall - Max 3 pts Language Repeat - Max 1 pts Language Follow 3 Step Command - Max 3 pts  Immunization History  Administered Date(s) Administered  . Hepatitis A 05/31/1998, 02/19/2006  . Influenza Whole 10/26/2012  . Influenza,inj,Quad PF,36+ Mos 12/14/2014  . Influenza-Unspecified 11/04/2013  . Pneumococcal Conjugate-13 06/06/2015  . Pneumococcal Polysaccharide-23 10/26/2012  . Td 01/26/1998  . Tdap 04/01/2006  . Zoster 01/27/2011   Screening Tests Health Maintenance  Topic Date Due  . INFLUENZA VACCINE  08/27/2015  . MAMMOGRAM  10/14/2015  . TETANUS/TDAP  03/31/2016  . COLONOSCOPY  01/26/2018  . DTaP/Tdap/Td  Completed  . DEXA SCAN  Completed  . ZOSTAVAX  Completed  . Hepatitis C Screening  Completed  . PNA vac Low Risk Adult  Completed      Plan:     I have personally reviewed and addressed the Medicare Annual Wellness questionnaire and have noted the following in the patient's chart:  A. Medical and social history B. Use of alcohol, tobacco or illicit drugs  C. Current medications and supplements D. Functional ability and status E.  Nutritional status F.  Physical activity G. Advance directives H. List of other physicians I.  Hospitalizations, surgeries, and ER visits in previous 12 months J.  [place] to include hearing, vision, cognitive, depression L. Referrals and appointments - none  In addition, I have reviewed and discussed with patient certain preventive protocols, quality metrics, and best practice recommendations. A written personalized care plan for preventive services as well as general preventive health recommendations were provided to patient.  See attached scanned questionnaire for additional information.    Signed,   Lindell Noe, MHA, BS, LPN Health Advisor [phone]

## 2015-06-07 LAB — HEPATITIS C ANTIBODY: HCV Ab: NEGATIVE

## 2015-06-07 NOTE — Progress Notes (Signed)
I reviewed health advisor's note, was available for consultation, and agree with documentation and plan.  

## 2015-06-11 ENCOUNTER — Encounter: Payer: Medicare HMO | Admitting: Family Medicine

## 2015-06-20 ENCOUNTER — Encounter: Payer: Self-pay | Admitting: Family Medicine

## 2015-06-20 ENCOUNTER — Ambulatory Visit (INDEPENDENT_AMBULATORY_CARE_PROVIDER_SITE_OTHER): Payer: Medicare HMO | Admitting: Family Medicine

## 2015-06-20 VITALS — BP 118/74 | HR 72 | Temp 97.7°F | Wt 199.5 lb

## 2015-06-20 DIAGNOSIS — Z0000 Encounter for general adult medical examination without abnormal findings: Secondary | ICD-10-CM | POA: Diagnosis not present

## 2015-06-20 DIAGNOSIS — J302 Other seasonal allergic rhinitis: Secondary | ICD-10-CM

## 2015-06-20 DIAGNOSIS — Z7189 Other specified counseling: Secondary | ICD-10-CM

## 2015-06-20 DIAGNOSIS — G4733 Obstructive sleep apnea (adult) (pediatric): Secondary | ICD-10-CM

## 2015-06-20 DIAGNOSIS — Z9989 Dependence on other enabling machines and devices: Secondary | ICD-10-CM

## 2015-06-20 DIAGNOSIS — E785 Hyperlipidemia, unspecified: Secondary | ICD-10-CM

## 2015-06-20 DIAGNOSIS — M25511 Pain in right shoulder: Secondary | ICD-10-CM | POA: Diagnosis not present

## 2015-06-20 DIAGNOSIS — Z0001 Encounter for general adult medical examination with abnormal findings: Secondary | ICD-10-CM | POA: Insufficient documentation

## 2015-06-20 DIAGNOSIS — M8580 Other specified disorders of bone density and structure, unspecified site: Secondary | ICD-10-CM

## 2015-06-20 DIAGNOSIS — E669 Obesity, unspecified: Secondary | ICD-10-CM

## 2015-06-20 MED ORDER — FLUTICASONE PROPIONATE 50 MCG/ACT NA SUSP: 2.0000 | Freq: Every day | NASAL | Status: DC | PRN

## 2015-06-20 NOTE — Progress Notes (Signed)
BP 118/74 mmHg  Pulse 72  Temp(Src) 97.7 F (36.5 C) (Oral)  Wt 199 lb 8 oz (90.493 kg)   CC: CPE  Subjective:    Patient ID: Lori Shaw, female    DOB: 07-Aug-1945, 70 y.o.   MRN: [id number]  HPI: Lori Shaw is a 70 y.o. female presenting on 06/20/2015 for Annual Exam   Seen by Katha Cabal last week for medicare wellness visit.  Allergies acting up R shoulder pain started 2 wks ago, may have aggravated shoulder at gym. Having trouble with shoulder press. Points to lateral shoulder, worse pain reaching overhead. Taking aleve which helps.   Prior on crestor - felt poorly on this medicine.   Passes vision screens. Trouble R hearing.   Preventative: Colon cancer screening - ~2010 per pt normal, rec rpt 10 yrs Well woman exam - s/p hysterectomy with 1 ovary remaining. No longer receives pap smears. Mammogram - last 2 yrs ago 09/2013 - requests Q2 yr testing. Does breast exams at home. Declines today.  Dexa Date: 08/2010 osteopenia. Drinks milk and eats yogurt daily. H/o low vit D in past.  Flu shot yearly Pneumovax 10/2012 , prevnar 2017 Tdap 03/2006 zostavax 2013  Advanced directives: working on this at home, needs to get this notarize. Would want new husband to be HCPOA Ceasar [place]) Seat belt use discussed Sunscreen use discussed. No changing moles on skin.   Lives with daughter, 1 dog  Occupation: retired, has worked in [employer], also Forensic psychologist  Edu: 1.5 yrs college  Activity: square dancing  Diet: good water, fruits/vegetables daily  Relevant past medical, surgical, family and social history reviewed and updated as indicated. Interim medical history since our last visit reviewed. Allergies and medications reviewed and updated. Current Outpatient Prescriptions on File Prior to Visit  Medication Sig  . cholecalciferol (VITAMIN D) 1000 UNITS tablet Take 1,000 Units by mouth daily. Reported on 06/06/2015   No current facility-administered  medications on file prior to visit.    Review of Systems  Constitutional: Negative for fever, chills, activity change, appetite change, fatigue and unexpected weight change.  HENT: Negative for hearing loss.   Eyes: Negative for visual disturbance.  Respiratory: Negative for cough, chest tightness, shortness of breath and wheezing.   Cardiovascular: Negative for chest pain, palpitations and leg swelling.  Gastrointestinal: Negative for nausea, vomiting, abdominal pain, diarrhea, constipation, blood in stool and abdominal distention.       Indigestion/heartburn related to food  Genitourinary: Negative for hematuria and difficulty urinating.  Musculoskeletal: Negative for myalgias, arthralgias and neck pain.  Skin: Negative for rash.  Neurological: Negative for dizziness, seizures, syncope and headaches.  Hematological: Negative for adenopathy. Does not bruise/bleed easily.  Psychiatric/Behavioral: Negative for dysphoric mood. The patient is not nervous/anxious.    Per HPI unless specifically indicated in ROS section     Objective:    BP 118/74 mmHg  Pulse 72  Temp(Src) 97.7 F (36.5 C) (Oral)  Wt 199 lb 8 oz (90.493 kg)  Wt Readings from Last 3 Encounters:  06/20/15 199 lb 8 oz (90.493 kg)  06/06/15 198 lb (89.812 kg)  03/05/15 196 lb 8 oz (89.132 kg)   Body mass index is 33.2 kg/(m^2).  Physical Exam  Constitutional: She is oriented to person, place, and time. She appears well-developed and well-nourished. No distress.  HENT:  Head: Normocephalic and atraumatic.  Right Ear: Hearing, tympanic membrane, external ear and ear canal normal.  Left Ear: Hearing, tympanic membrane, external ear  and ear canal normal.  Nose: Nose normal.  Mouth/Throat: Uvula is midline, oropharynx is clear and moist and mucous membranes are normal. No oropharyngeal exudate, posterior oropharyngeal edema or posterior oropharyngeal erythema.  Eyes: Conjunctivae and EOM are normal. Pupils are equal,  round, and reactive to light. No scleral icterus.  Neck: Normal range of motion. Neck supple. Carotid bruit is not present. No thyromegaly present.  Cardiovascular: Normal rate, regular rhythm, normal heart sounds and intact distal pulses.   No murmur heard. Pulses:      Radial pulses are 2+ on the right side, and 2+ on the left side.  Pulmonary/Chest: Effort normal and breath sounds normal. No respiratory distress. She has no wheezes. She has no rales.  Abdominal: Soft. Bowel sounds are normal. She exhibits no distension and no mass. There is no tenderness. There is no rebound and no guarding.  Musculoskeletal: Normal range of motion. She exhibits no edema.  L shoulder WNL R Shoulder exam: No deformity of shoulders on inspection. + pain with palpation of anterior and lateral shoulder FROM in abduction and forward flexion with popping/pain greater in forward flexion. + pain or weakness with testing SITS in ext/int rotation. No significant pain with empty can sign. Neg Yerguson test. No impingement. + pain with crossover test. No pain with rotation of humeral head in [place] joint.   Lymphadenopathy:    She has no cervical adenopathy.  Neurological: She is alert and oriented to person, place, and time.  CN grossly intact, station and gait intact  Skin: Skin is warm and dry. No rash noted.  Psychiatric: She has a normal mood and affect. Her behavior is normal. Judgment and thought content normal.  Nursing note and vitals reviewed.  Results for orders placed or performed in visit on 06/06/15  Lipid panel  Result Value Ref Range   Cholesterol 246 (H) 0 - 200 mg/dL   Triglycerides 244.0 (H) 0.0 - 149.0 mg/dL   HDL 38.30 (L) >39.00 mg/dL   VLDL 48.8 (H) 0.0 - 40.0 mg/dL   Total CHOL/HDL Ratio 6    NonHDL 207.93   Comprehensive metabolic panel  Result Value Ref Range   Sodium 139 135 - 145 mEq/L   Potassium 4.0 3.5 - 5.1 mEq/L   Chloride 104 96 - 112 mEq/L   CO2 26 19 - 32 mEq/L    Glucose, Bld 103 (H) 70 - 99 mg/dL   BUN 12 6 - 23 mg/dL   Creatinine, Ser 0.83 0.40 - 1.20 mg/dL   Total Bilirubin 0.7 0.2 - 1.2 mg/dL   Alkaline Phosphatase 67 39 - 117 U/L   AST 19 0 - 37 U/L   ALT 21 0 - 35 U/L   Total Protein 6.9 6.0 - 8.3 g/dL   Albumin 4.5 3.5 - 5.2 g/dL   Calcium 9.5 8.4 - 10.5 mg/dL   GFR 72.49 >60.00 mL/min  TSH  Result Value Ref Range   TSH 5.79 (H) 0.35 - 4.50 uIU/mL  LDL cholesterol, direct  Result Value Ref Range   Direct LDL 163.0 mg/dL      Assessment & Plan:   Problem List Items Addressed This Visit    HLD (hyperlipidemia)    Chronic, deteriorated. rec low chol diet x 4 mo then recheck levels and decide on statin. Prior crestor made her feel bad but helped cholesterol levels.  +fmhx CAD      OSA on CPAP    Reports compliance.      Seasonal allergic rhinitis -  Primary    Worsening recently. rec continue flonase.      Relevant Medications   fluticasone (FLONASE) 50 MCG/ACT nasal spray   Osteopenia    Last DEXA ~2012. Continue vit D, reviewed calcium in diet.       Obesity, Class I, BMI 30-34.9    Discussed continued activity for goal weight loss.      Health maintenance examination    Preventative protocols reviewed and updated unless pt declined. Discussed healthy diet and lifestyle.       Advanced care planning/counseling discussion    Advanced directives: working on this at home, needs to get this notarize. Would want new husband to be HCPOA Ceasar Mons [place])      Right shoulder pain    Anticipate R RTC injury. Discussed continued aleve, heating pad, and provided with exercises from [facility] pt advisor. If no better, would consider PT referral. Discussed exercises she should be able to do at gym without concern for re-injury.           Follow up plan: Return in about 1 year (around 06/19/2016), or as needed, for annual exam, prior fasting for blood work.  Ria Bush, MD

## 2015-06-20 NOTE — Assessment & Plan Note (Addendum)
Chronic, deteriorated. rec low chol diet x 4 mo then recheck levels and decide on statin. Prior crestor made her feel bad but helped cholesterol levels.  +fmhx CAD

## 2015-06-20 NOTE — Assessment & Plan Note (Signed)
Reports compliance

## 2015-06-20 NOTE — Assessment & Plan Note (Signed)
Anticipate R RTC injury. Discussed continued aleve, heating pad, and provided with exercises from [facility] pt advisor. If no better, would consider PT referral. Discussed exercises she should be able to do at gym without concern for re-injury.

## 2015-06-20 NOTE — Assessment & Plan Note (Signed)
Last DEXA ~2012. Continue vit D, reviewed calcium in diet.

## 2015-06-20 NOTE — Assessment & Plan Note (Signed)
Discussed continued activity for goal weight loss.

## 2015-06-20 NOTE — Progress Notes (Signed)
Pre visit review using our clinic review tool, if applicable. No additional management support is needed unless otherwise documented below in the visit note. 

## 2015-06-20 NOTE — Assessment & Plan Note (Signed)
Advanced directives: working on this at home, needs to get this notarize. Would want new husband to be HCPOA Ceasar Mons [place])

## 2015-06-20 NOTE — Assessment & Plan Note (Signed)
Worsening recently. rec continue flonase.

## 2015-06-20 NOTE — Assessment & Plan Note (Signed)
Preventative protocols reviewed and updated unless pt declined. Discussed healthy diet and lifestyle.  

## 2015-06-20 NOTE — Patient Instructions (Addendum)
Sign release for records from [facility] report Cholesterol was too high, sugar a bit elevated. Work on low cholesterol diet for next 4 months and return to recheck labs. For shoulder - I think you have rotator cuff injury. Treat with continued aleve, heating pad, and try exercises provided today. If no better, let us know to consider physical therapy referral. If marked worsening we may refer you to ortho.  Bring me copy of your living will Low cholesterol diet handout provided today. Look into mediterranean diet      Mediterranean Diet  Why follow it? Research shows. . Those who follow the Mediterranean diet have a reduced risk of heart disease  . The diet is associated with a reduced incidence of Parkinson's and Alzheimer's diseases . People following the diet may have longer life expectancies and lower rates of chronic diseases  . The Dietary Guidelines for Americans recommends the Mediterranean diet as an eating plan to promote health and prevent disease  What Is the Mediterranean Diet?  . Healthy eating plan based on typical foods and recipes of Mediterranean-style cooking . The diet is primarily a plant based diet; these foods should make up a majority of meals   Starches - Plant based foods should make up a majority of meals - They are an important sources of vitamins, minerals, energy, antioxidants, and fiber - Choose whole grains, foods high in fiber and minimally processed items  - Typical grain sources include wheat, oats, barley, corn, brown rice, bulgar, farro, millet, polenta, couscous  - Various types of beans include chickpeas, lentils, fava beans, black beans, white beans   Fruits  Veggies - Large quantities of antioxidant rich fruits & veggies; 6 or more servings  - Vegetables can be eaten raw or lightly drizzled with oil and cooked  - Vegetables common to the traditional Mediterranean Diet include: artichokes, arugula, beets, broccoli, brussel sprouts,  cabbage, carrots, celery, collard greens, cucumbers, eggplant, kale, leeks, lemons, lettuce, mushrooms, okra, onions, peas, peppers, potatoes, pumpkin, radishes, rutabaga, shallots, spinach, sweet potatoes, turnips, zucchini - Fruits common to the Mediterranean Diet include: apples, apricots, avocados, cherries, clementines, dates, figs, grapefruits, grapes, melons, nectarines, oranges, peaches, pears, pomegranates, strawberries, tangerines  Fats - Replace butter and margarine with healthy oils, such as olive oil, canola oil, and tahini  - Limit nuts to no more than a handful a day  - Nuts include walnuts, almonds, pecans, pistachios, pine nuts  - Limit or avoid candied, honey roasted or heavily salted nuts - Olives are central to the [employer] - can be eaten whole or used in a variety of dishes   Meats Protein - Limiting red meat: no more than a few times a month - When eating red meat: choose lean cuts and keep the portion to the size of deck of cards - Eggs: approx. 0 to 4 times a week  - Fish and lean poultry: at least 2 a week  - Healthy protein sources include, chicken, [place], lean beef, lamb - Increase intake of seafood such as tuna, salmon, trout, mackerel, shrimp, scallops - Avoid or limit high fat processed meats such as sausage and bacon  Dairy - Include moderate amounts of low fat dairy products  - Focus on healthy dairy such as fat free yogurt, skim milk, low or reduced fat cheese - Limit dairy products higher in fat such as whole or 2% milk, cheese, ice cream  Alcohol - Moderate amounts of red wine is ok  - No  more than 5 oz daily for women (all ages) and men older than age 60  - No more than 10 oz of wine daily for men younger than 74  Other - Limit sweets and other desserts  - Use herbs and spices instead of salt to flavor foods  - Herbs and spices common to the traditional Mediterranean Diet include: basil, bay leaves, chives, cloves, cumin, fennel, garlic, lavender,  marjoram, mint, oregano, parsley, pepper, rosemary, sage, savory, sumac, tarragon, thyme   It's not just a diet, it's a lifestyle:  . The Mediterranean diet includes lifestyle factors typical of those in the region  . Foods, drinks and meals are best eaten with others and savored . Daily physical activity is important for overall good health . This could be strenuous exercise like running and aerobics . This could also be more leisurely activities such as walking, housework, yard-work, or taking the stairs . Moderation is the key; a balanced and healthy diet accommodates most foods and drinks . Consider portion sizes and frequency of consumption of certain foods   Meal Ideas & Options:  . Breakfast:  o Whole wheat toast or whole wheat English muffins with peanut butter & hard boiled egg o Steel cut oats topped with apples & cinnamon and skim milk  o Fresh fruit: banana, strawberries, melon, berries, peaches  o Smoothies: strawberries, bananas, greek yogurt, peanut butter o Low fat greek yogurt with blueberries and granola  o Egg white omelet with spinach and mushrooms o Breakfast couscous: whole wheat couscous, apricots, skim milk, cranberries  . Sandwiches:  o Hummus and grilled vegetables (peppers, zucchini, squash) on whole wheat bread   o Grilled chicken on whole wheat pita with lettuce, tomatoes, cucumbers or tzatziki  o Tuna salad on whole wheat bread: tuna salad made with greek yogurt, olives, red peppers, capers, green onions o Garlic rosemary lamb pita: lamb sauted with garlic, rosemary, salt & pepper; add lettuce, cucumber, greek yogurt to pita - flavor with lemon juice and black pepper  . Seafood:  o Mediterranean grilled salmon, seasoned with garlic, basil, parsley, lemon juice and black pepper o Shrimp, lemon, and spinach whole-grain pasta salad made with low fat greek yogurt  o Seared scallops with lemon orzo  o Seared tuna steaks seasoned salt, pepper, coriander topped  with tomato mixture of olives, tomatoes, olive oil, minced garlic, parsley, green onions and cappers  . Meats:  o Herbed greek chicken salad with kalamata olives, cucumber, feta  o Red bell peppers stuffed with spinach, bulgur, lean ground beef (or lentils) & topped with feta   o Kebabs: skewers of chicken, tomatoes, onions, zucchini, squash  o [place] burgers: made with red onions, mint, dill, lemon juice, feta cheese topped with roasted red peppers . Vegetarian o Cucumber salad: cucumbers, artichoke hearts, celery, red onion, feta cheese, tossed in olive oil & lemon juice  o Hummus and whole grain pita points with a greek salad (lettuce, tomato, feta, olives, cucumbers, red onion) o Lentil soup with celery, carrots made with vegetable broth, garlic, salt and pepper  o Tabouli salad: parsley, bulgur, mint, scallions, cucumbers, tomato, radishes, lemon juice, olive oil, salt and pepper.   Health Maintenance, Female Adopting a healthy lifestyle and getting preventive care can go a long way to promote health and wellness. Talk with your health care provider about what schedule of regular examinations is right for you. This is a good chance for you to check in with your provider about disease prevention and  staying healthy. In between checkups, there are plenty of things you can do on your own. Experts have done a lot of research about which lifestyle changes and preventive measures are most likely to keep you healthy. Ask your health care provider for more information. WEIGHT AND DIET  Eat a healthy diet  Be sure to include plenty of vegetables, fruits, low-fat dairy products, and lean protein.  Do not eat a lot of foods high in solid fats, added sugars, or salt.  Get regular exercise. This is one of the most important things you can do for your health.  Most adults should exercise for at least 150 minutes each week. The exercise should increase your heart rate and make you sweat  (moderate-intensity exercise).  Most adults should also do strengthening exercises at least twice a week. This is in addition to the moderate-intensity exercise.  Maintain a healthy weight  Body mass index (BMI) is a measurement that can be used to identify possible weight problems. It estimates body fat based on height and weight. Your health care provider can help determine your BMI and help you achieve or maintain a healthy weight.  For females 39 years of age and older:   A BMI below 18.5 is considered underweight.  A BMI of 18.5 to 24.9 is normal.  A BMI of 25 to 29.9 is considered overweight.  A BMI of 30 and above is considered obese.  Watch levels of cholesterol and blood lipids  You should start having your blood tested for lipids and cholesterol at 69 years of age, then have this test every 5 years.  You may need to have your cholesterol levels checked more often if:  Your lipid or cholesterol levels are high.  You are older than 69 years of age.  You are at high risk for heart disease.  CANCER SCREENING   Lung Cancer  Lung cancer screening is recommended for adults 82-52 years old who are at high risk for lung cancer because of a history of smoking.  A yearly low-dose CT scan of the lungs is recommended for people who:  Currently smoke.  Have quit within the past 15 years.  Have at least a 30-pack-year history of smoking. A pack year is smoking an average of one pack of cigarettes a day for 1 year.  Yearly screening should continue until it has been 15 years since you quit.  Yearly screening should stop if you develop a health problem that would prevent you from having lung cancer treatment.  Breast Cancer  Practice breast self-awareness. This means understanding how your breasts normally appear and feel.  It also means doing regular breast self-exams. Let your health care provider know about any changes, no matter how small.  If you are in your 20s  or 30s, you should have a clinical breast exam (CBE) by a health care provider every 1-3 years as part of a regular health exam.  If you are 23 or older, have a CBE every year. Also consider having a breast X-ray (mammogram) every year.  If you have a family history of breast cancer, talk to your health care provider about genetic screening.  If you are at high risk for breast cancer, talk to your health care provider about having an MRI and a mammogram every year.  Breast cancer gene (BRCA) assessment is recommended for women who have family members with BRCA-related cancers. BRCA-related cancers include:  Breast.  Ovarian.  Tubal.  Peritoneal cancers.  Results  of the assessment will determine the need for genetic counseling and BRCA1 and BRCA2 testing. Cervical Cancer Your health care provider may recommend that you be screened regularly for cancer of the pelvic organs (ovaries, uterus, and vagina). This screening involves a pelvic examination, including checking for microscopic changes to the surface of your cervix (Pap test). You may be encouraged to have this screening done every 3 years, beginning at age 37.  For women ages 72-65, health care providers may recommend pelvic exams and Pap testing every 3 years, or they may recommend the Pap and pelvic exam, combined with testing for human papilloma virus (HPV), every 5 years. Some types of HPV increase your risk of cervical cancer. Testing for HPV may also be done on women of any age with unclear Pap test results.  Other health care providers may not recommend any screening for nonpregnant women who are considered low risk for pelvic cancer and who do not have symptoms. Ask your health care provider if a screening pelvic exam is right for you.  If you have had past treatment for cervical cancer or a condition that could lead to cancer, you need Pap tests and screening for cancer for at least 20 years after your treatment. If Pap tests  have been discontinued, your risk factors (such as having a new sexual partner) need to be reassessed to determine if screening should resume. Some women have medical problems that increase the chance of getting cervical cancer. In these cases, your health care provider may recommend more frequent screening and Pap tests. Colorectal Cancer  This type of cancer can be detected and often prevented.  Routine colorectal cancer screening usually begins at 69 years of age and continues through 69 years of age.  Your health care provider may recommend screening at an earlier age if you have risk factors for colon cancer.  Your health care provider may also recommend using home test kits to check for hidden blood in the stool.  A small camera at the end of a tube can be used to examine your colon directly (sigmoidoscopy or colonoscopy). This is done to check for the earliest forms of colorectal cancer.  Routine screening usually begins at age 81.  Direct examination of the colon should be repeated every 5-10 years through 69 years of age. However, you may need to be screened more often if early forms of precancerous polyps or small growths are found. Skin Cancer  Check your skin from head to toe regularly.  Tell your health care provider about any new moles or changes in moles, especially if there is a change in a mole's shape or color.  Also tell your health care provider if you have a mole that is larger than the size of a pencil eraser.  Always use sunscreen. Apply sunscreen liberally and repeatedly throughout the day.  Protect yourself by wearing long sleeves, pants, a wide-brimmed hat, and sunglasses whenever you are outside. HEART DISEASE, DIABETES, AND HIGH BLOOD PRESSURE   High blood pressure causes heart disease and increases the risk of stroke. High blood pressure is more likely to develop in:  People who have blood pressure in the high end of the normal range (130-139/85-89 mm  Hg).  People who are overweight or obese.  People who are African American.  If you are 57-1 years of age, have your blood pressure checked every 3-5 years. If you are 56 years of age or older, have your blood pressure checked every year. You  should have your blood pressure measured twice--once when you are at a hospital or clinic, and once when you are not at a hospital or clinic. Record the average of the two measurements. To check your blood pressure when you are not at a hospital or clinic, you can use:  An automated blood pressure machine at a pharmacy.  A home blood pressure monitor.  If you are between 84 years and 7 years old, ask your health care provider if you should take aspirin to prevent strokes.  Have regular diabetes screenings. This involves taking a blood sample to check your fasting blood sugar level.  If you are at a normal weight and have a low risk for diabetes, have this test once every three years after 69 years of age.  If you are overweight and have a high risk for diabetes, consider being tested at a younger age or more often. PREVENTING INFECTION  Hepatitis B  If you have a higher risk for hepatitis B, you should be screened for this virus. You are considered at high risk for hepatitis B if:  You were born in a country where hepatitis B is common. Ask your health care provider which countries are considered high risk.  Your parents were born in a high-risk country, and you have not been immunized against hepatitis B (hepatitis B vaccine).  You have HIV or AIDS.  You use needles to inject street drugs.  You live with someone who has hepatitis B.  You have had sex with someone who has hepatitis B.  You get hemodialysis treatment.  You take certain medicines for conditions, including cancer, organ transplantation, and autoimmune conditions. Hepatitis C  Blood testing is recommended for:  Everyone born from [birth year] through 1964.  Anyone with known  risk factors for hepatitis C. Sexually transmitted infections (STIs)  You should be screened for sexually transmitted infections (STIs) including gonorrhea and chlamydia if:  You are sexually active and are younger than 69 years of age.  You are older than 69 years of age and your health care provider tells you that you are at risk for this type of infection.  Your sexual activity has changed since you were last screened and you are at an increased risk for chlamydia or gonorrhea. Ask your health care provider if you are at risk.  If you do not have HIV, but are at risk, it may be recommended that you take a prescription medicine daily to prevent HIV infection. This is called pre-exposure prophylaxis (PrEP). You are considered at risk if:  You are sexually active and do not regularly use condoms or know the HIV status of your partner(s).  You take drugs by injection.  You are sexually active with a partner who has HIV. Talk with your health care provider about whether you are at high risk of being infected with HIV. If you choose to begin PrEP, you should first be tested for HIV. You should then be tested every 3 months for as long as you are taking PrEP.  PREGNANCY   If you are premenopausal and you may become pregnant, ask your health care provider about preconception counseling.  If you may become pregnant, take 400 to 800 micrograms (mcg) of folic acid every day.  If you want to prevent pregnancy, talk to your health care provider about birth control (contraception). OSTEOPOROSIS AND MENOPAUSE   Osteoporosis is a disease in which the bones lose minerals and strength with aging. This can result in serious  bone fractures. Your risk for osteoporosis can be identified using a bone density scan.  If you are 56 years of age or older, or if you are at risk for osteoporosis and fractures, ask your health care provider if you should be screened.  Ask your health care provider whether you  should take a calcium or vitamin D supplement to lower your risk for osteoporosis.  Menopause may have certain physical symptoms and risks.  Hormone replacement therapy may reduce some of these symptoms and risks. Talk to your health care provider about whether hormone replacement therapy is right for you.  HOME CARE INSTRUCTIONS   Schedule regular health, dental, and eye exams.  Stay current with your immunizations.   Do not use any tobacco products including cigarettes, chewing tobacco, or electronic cigarettes.  If you are pregnant, do not drink alcohol.  If you are breastfeeding, limit how much and how often you drink alcohol.  Limit alcohol intake to no more than 1 drink per day for nonpregnant women. One drink equals 12 ounces of beer, 5 ounces of wine, or 1 ounces of hard liquor.  Do not use street drugs.  Do not share needles.  Ask your health care provider for help if you need support or information about quitting drugs.  Tell your health care provider if you often feel depressed.  Tell your health care provider if you have ever been abused or do not feel safe at home.   This information is not intended to replace advice given to you by your health care provider. Make sure you discuss any questions you have with your health care provider.   Document Released: 07/28/2010 Document Revised: 02/02/2014 Document Reviewed: 12/14/2012 Elsevier Interactive Patient Education [employer].

## 2015-06-27 DIAGNOSIS — G4733 Obstructive sleep apnea (adult) (pediatric): Secondary | ICD-10-CM | POA: Diagnosis not present

## 2015-07-08 DIAGNOSIS — H53483 Generalized contraction of visual field, bilateral: Secondary | ICD-10-CM | POA: Diagnosis not present

## 2015-07-25 DIAGNOSIS — R69 Illness, unspecified: Secondary | ICD-10-CM | POA: Diagnosis not present

## 2015-08-07 DIAGNOSIS — G4733 Obstructive sleep apnea (adult) (pediatric): Secondary | ICD-10-CM | POA: Diagnosis not present

## 2015-08-16 ENCOUNTER — Encounter: Payer: Self-pay | Admitting: Family Medicine

## 2015-08-16 ENCOUNTER — Ambulatory Visit (INDEPENDENT_AMBULATORY_CARE_PROVIDER_SITE_OTHER): Payer: Medicare HMO | Admitting: Family Medicine

## 2015-08-16 VITALS — BP 100/60 | HR 76 | Temp 97.9°F | Wt 196.5 lb

## 2015-08-16 DIAGNOSIS — W57XXXA Bitten or stung by nonvenomous insect and other nonvenomous arthropods, initial encounter: Secondary | ICD-10-CM | POA: Insufficient documentation

## 2015-08-16 DIAGNOSIS — S20162A Insect bite (nonvenomous) of breast, left breast, initial encounter: Secondary | ICD-10-CM | POA: Diagnosis not present

## 2015-08-16 NOTE — Progress Notes (Signed)
Pre visit review using our clinic review tool, if applicable. No additional management support is needed unless otherwise documented below in the visit note. 

## 2015-08-16 NOTE — Patient Instructions (Signed)
You did get all of tick out. Watch for developing fever, rash, joint pains, headache or neck stiffness, or abdominal pain/nausea. If this occurs over next 10 days, let us know for antibiotic course.  Good to see you today, call us with questions.  Tick Bite Information Ticks are insects that attach themselves to the skin and draw blood for food. There are various types of ticks. Common types include wood ticks and deer ticks. Most ticks live in shrubs and grassy areas. Ticks can climb onto your body when you make contact with leaves or grass where the tick is waiting. The most common places on the body for ticks to attach themselves are the scalp, neck, armpits, waist, and groin. Most tick bites are harmless, but sometimes ticks carry germs that cause diseases. These germs can be spread to a person during the tick's feeding process. The chance of a disease spreading through a tick bite depends on:   The type of tick.  Time of year.   How long the tick is attached.   Geographic location.  HOW CAN YOU PREVENT TICK BITES? Take these steps to help prevent tick bites when you are outdoors:  Wear protective clothing. Long sleeves and long pants are best.   Wear white clothes so you can see ticks more easily.  Tuck your pant legs into your socks.   If walking on a trail, stay in the middle of the trail to avoid brushing against bushes.  Avoid walking through areas with long grass.  Put insect repellent on all exposed skin and along boot tops, pant legs, and sleeve cuffs.   Check clothing, hair, and skin repeatedly and before going inside.   Brush off any ticks that are not attached.  Take a shower or bath as soon as possible after being outdoors.  WHAT IS THE PROPER WAY TO REMOVE A TICK? Ticks should be removed as soon as possible to help prevent diseases caused by tick bites. 1. If latex gloves are available, put them on before trying to remove a tick.  2. Using fine-point  tweezers, grasp the tick as close to the skin as possible. You may also use curved forceps or a tick removal tool. Grasp the tick as close to its head as possible. Avoid grasping the tick on its body. 3. Pull gently with steady upward pressure until the tick lets go. Do not twist the tick or jerk it suddenly. This may break off the tick's head or mouth parts. 4. Do not squeeze or crush the tick's body. This could force disease-carrying fluids from the tick into your body.  5. After the tick is removed, wash the bite area and your hands with soap and water or other disinfectant such as alcohol. 6. Apply a small amount of antiseptic cream or ointment to the bite site.  7. Wash and disinfect any instruments that were used.  Do not try to remove a tick by applying a hot match, petroleum jelly, or fingernail polish to the tick. These methods do not work and may increase the chances of disease being spread from the tick bite.  WHEN SHOULD YOU SEEK MEDICAL CARE? Contact your health care provider if you are unable to remove a tick from your skin or if a part of the tick breaks off and is stuck in the skin.  After a tick bite, you need to be aware of signs and symptoms that could be related to diseases spread by ticks. Contact your health care  provider if you develop any of the following in the days or weeks after the tick bite:  Unexplained fever.  Rash. A circular rash that appears days or weeks after the tick bite may indicate the possibility of Lyme disease. The rash may resemble a target with a bull's-eye and may occur at a different part of your body than the tick bite.  Redness and swelling in the area of the tick bite.   Tender, swollen lymph glands.   Diarrhea.   Weight loss.   Cough.   Fatigue.   Muscle, joint, or bone pain.   Abdominal pain.   Headache.   Lethargy or a change in your level of consciousness.  Difficulty walking or moving your legs.   Numbness in the  legs.   Paralysis.  Shortness of breath.   Confusion.   Repeated vomiting.    This information is not intended to replace advice given to you by your health care provider. Make sure you discuss any questions you have with your health care provider.   Document Released: 01/10/2000 Document Revised: 02/02/2014 Document Reviewed: 06/22/2012 Elsevier Interactive Patient Education [employer].

## 2015-08-16 NOTE — Assessment & Plan Note (Signed)
No remaining tick parts that I can see. Reassured. Discussed red flags to monitor for tick borne illness.

## 2015-08-16 NOTE — Progress Notes (Signed)
   BP 100/60 mmHg  Pulse 76  Temp(Src) 97.9 F (36.6 C) (Oral)  Wt 196 lb 8 oz (89.132 kg)   CC: tick bite  Subjective:    Patient ID: Lori Shaw, female    DOB: 1945-11-11, 70 y.o.   MRN: [id number]  HPI: Lori Shaw is a 70 y.o. female presenting on 08/16/2015 for Tick bite   Pulled tick off L breast last night. Unsure how long present. Head remained. Thinks she got this out but unsure. In and out of yard.   No fevers/chills, new rashes or joint pain,s headache, nausea.   We still have not received records of colonoscopy from Dr Sabra Heck in [place].   Relevant past medical, surgical, family and social history reviewed and updated as indicated. Interim medical history since our last visit reviewed. Allergies and medications reviewed and updated. Current Outpatient Prescriptions on File Prior to Visit  Medication Sig  . cholecalciferol (VITAMIN D) 1000 UNITS tablet Take 1,000 Units by mouth daily. Reported on 06/06/2015  . fluticasone (FLONASE) 50 MCG/ACT nasal spray Place 2 sprays into both nostrils daily as needed.   No current facility-administered medications on file prior to visit.    Review of Systems Per HPI unless specifically indicated in ROS section     Objective:    BP 100/60 mmHg  Pulse 76  Temp(Src) 97.9 F (36.6 C) (Oral)  Wt 196 lb 8 oz (89.132 kg)  Wt Readings from Last 3 Encounters:  08/16/15 196 lb 8 oz (89.132 kg)  06/20/15 199 lb 8 oz (90.493 kg)  06/06/15 198 lb (89.812 kg)   Physical Exam  Constitutional: She appears well-developed and well-nourished. No distress.  Skin: Skin is warm and dry. No rash noted. No erythema.  Tick bite left lateral breast without residual tick part, no surrounding erythema  Nursing note and vitals reviewed.       Assessment & Plan:   Problem List Items Addressed This Visit    Tick bite of left female breast - Primary    No remaining tick parts that I can see. Reassured. Discussed red flags to monitor  for tick borne illness.          Follow up plan: Return if symptoms worsen or fail to improve.  Ria Bush, MD

## 2015-08-23 ENCOUNTER — Encounter: Payer: Self-pay | Admitting: Family Medicine

## 2015-08-23 ENCOUNTER — Ambulatory Visit (INDEPENDENT_AMBULATORY_CARE_PROVIDER_SITE_OTHER): Payer: Medicare HMO | Admitting: Family Medicine

## 2015-08-23 DIAGNOSIS — W57XXXA Bitten or stung by nonvenomous insect and other nonvenomous arthropods, initial encounter: Secondary | ICD-10-CM | POA: Diagnosis not present

## 2015-08-23 DIAGNOSIS — R21 Rash and other nonspecific skin eruption: Secondary | ICD-10-CM | POA: Diagnosis not present

## 2015-08-23 DIAGNOSIS — S20162A Insect bite (nonvenomous) of breast, left breast, initial encounter: Secondary | ICD-10-CM

## 2015-08-23 MED ORDER — DOXYCYCLINE HYCLATE 100 MG PO TABS: 100.0000 mg | ORAL_TABLET | Freq: Two times a day (BID) | ORAL | 0 refills | Status: DC

## 2015-08-23 NOTE — Progress Notes (Signed)
Pre visit review using our clinic review tool, if applicable. No additional management support is needed unless otherwise documented below in the visit note. 

## 2015-08-23 NOTE — Patient Instructions (Addendum)
Stop at lab on way out.  Start doxycycline for 10 day course. Call if fever or  redness continues to spread.

## 2015-08-23 NOTE — Assessment & Plan Note (Signed)
See discussion of rash. Eval with labs.

## 2015-08-23 NOTE — Progress Notes (Signed)
   Subjective:    Patient ID: Lori Shaw, female    DOB: 01/17/1946, 70 y.o.   MRN: [id number]  HPI  70 year old female pt of Dr. Synthia Innocent presents with new onset tick bite.   She saw Dr. Darnell Level on 7/21 for the tick bite. On left lateral breast. Bite happen 7/20 unsure how long presents. Unsure type of tick. Since then she has noted redness spreading at the site.  Area is itchy. No fever. Occ feeling nausea, one episode of diarrhea.  She has been treat in last month for right shoulder pain. Rec were home PT. Had been doing, until last few days. Has not been treating with med, tried aleve today.  Wt Readings from Last 3 Encounters:  08/23/15 202 lb 12 oz (92 kg)  08/16/15 196 lb 8 oz (89.1 kg)  06/20/15 199 lb 8 oz (90.5 kg)      Review of Systems  Constitutional: Negative for fatigue and fever.  HENT: Negative for ear pain.   Eyes: Negative for pain.  Respiratory: Negative for chest tightness and shortness of breath.   Cardiovascular: Negative for chest pain, palpitations and leg swelling.  Gastrointestinal: Negative for abdominal pain.  Genitourinary: Negative for dysuria.       Objective:   Physical Exam  Constitutional: Vital signs are normal. She appears well-developed and well-nourished. She is cooperative.  Non-toxic appearance. She does not appear ill. No distress.  HENT:  Head: Normocephalic.  Right Ear: Hearing, tympanic membrane, external ear and ear canal normal. Tympanic membrane is not erythematous, not retracted and not bulging.  Left Ear: Hearing, tympanic membrane, external ear and ear canal normal. Tympanic membrane is not erythematous, not retracted and not bulging.  Nose: No mucosal edema or rhinorrhea. Right sinus exhibits no maxillary sinus tenderness and no frontal sinus tenderness. Left sinus exhibits no maxillary sinus tenderness and no frontal sinus tenderness.  Mouth/Throat: Uvula is midline, oropharynx is clear and moist and mucous membranes are normal.   Eyes: Conjunctivae, EOM and lids are normal. Pupils are equal, round, and reactive to light. Lids are everted and swept, no foreign bodies found.  Neck: Trachea normal and normal range of motion. Neck supple. Carotid bruit is not present. No thyroid mass and no thyromegaly present.  Cardiovascular: Normal rate, regular rhythm, S1 normal, S2 normal, normal heart sounds, intact distal pulses and normal pulses.  Exam reveals no gallop and no friction rub.   No murmur heard. Pulmonary/Chest: Effort normal and breath sounds normal. No tachypnea. No respiratory distress. She has no decreased breath sounds. She has no wheezes. She has no rhonchi. She has no rales.  Abdominal: Soft. Normal appearance and bowel sounds are normal. There is no tenderness.  Neurological: She is alert.  Skin: Skin is warm, dry and intact. No rash noted.     Left breast with erythematous nodule with scab centrally with slight clearing then erythema forming target lesion. 3 cm diameter.  No axillary lymphadenopathy.  Psychiatric: Her speech is normal and behavior is normal. Judgment and thought content normal. Her mood appears not anxious. Cognition and memory are normal. She does not exhibit a depressed mood.          Assessment & Plan:

## 2015-08-23 NOTE — Assessment & Plan Note (Signed)
Concerning for target lesion (erythema migrans) at site of bite, although early on time course for typical lyme disease presentation ( about 7 days out from bite).  Also concern for local skin infection given erythema and warmth .  Will treat with antibiotics for local bacterial skin infection... Will use doxy to treat as antibiotic choice given moderate level concern for lyme.  In meantime eval with labs for tick borne illness.

## 2015-08-26 LAB — LYME AB/WESTERN BLOT REFLEX: B burgdorferi Ab IgG+IgM: <0.9 Index (ref <0.90)

## 2015-08-28 LAB — ROCKY MTN SPOTTED FVR ABS PNL(IGG+IGM)
RMSF IGM: NOT DETECTED
RMSF IgG: NOT DETECTED

## 2015-09-01 LAB — EHRLICHIA ANTIBODY PANEL: E chaffeensis (HGE) Ab, IgG: <1:64 {titer}

## 2015-09-17 ENCOUNTER — Encounter: Payer: Self-pay | Admitting: Neurology

## 2015-09-17 ENCOUNTER — Ambulatory Visit (INDEPENDENT_AMBULATORY_CARE_PROVIDER_SITE_OTHER): Payer: Medicare HMO | Admitting: Neurology

## 2015-09-17 VITALS — BP 100/70 | HR 60 | Resp 20 | Ht 66.0 in | Wt 202.0 lb

## 2015-09-17 DIAGNOSIS — G4733 Obstructive sleep apnea (adult) (pediatric): Secondary | ICD-10-CM | POA: Diagnosis not present

## 2015-09-17 DIAGNOSIS — Z9989 Dependence on other enabling machines and devices: Principal | ICD-10-CM

## 2015-09-17 NOTE — Progress Notes (Signed)
[facility]   Provider:  Larey Seat, M D  Referring Provider: Ria Bush, MD Primary Care Physician:  Ria Bush, MD  Chief Complaint  Patient presents with  . Follow-up    still tired, cpap going well    HPI:  Lori Shaw is a 69 y.o. female ,seen here as a revisit from Dr. Danise Mina for CPA follow up.   Chief complaint according to patient : none - just here for routine check up .  Lori Shaw underwent a split-night polysomnography on 05-22-14, after she is a current CPAP user had presented again with excessive daytime sleepiness and fatigue breakthrough snoring and nocturia. The machine appear to be malfunctioning turning off in the middle of the night and she needed to requalify for CPAP machine. The baseline part of her split-night polysomnography revealed an AHI of 23.8 and an RDI of 42.9. Supine AHI was 32.3 she did not sleep in REM for the first diagnostic part of the study. She had no significant oxygen desaturations. This was important as the patient had also presented this morning headaches. The titration begun at 5 cm water CPAP and advanced to 9 cm but she seemed to have done very well with out EPR. Her AHI became 0.8 and she was using a dream wear mask with a ResMED machine.  As newlyweds she and her husband went to a trip to [place] but unfortunately she forgot the power cord , logging the CPAP overseas. I will review her compliance data up to August 3 and it is clear that the patient is highly compliant and use the machine 93% of the time for the first 30 days of use. Average user time changed due to the icelandic break / interruption. The machine is set at 9 cm water pressure with 2 cm EPR, the  residual AHI is 1.8. This is a very good resolution of her sleep apnea and she has noted no nocturia no morning headaches and is more alert and at night sound asleep. She still endorsed 17 points on the Epworth Sleepiness Scale . The patient still is  excessively daytime sleepy. She states that she falls asleep very promptly once she puts her CPAP on and that her husband, who likes watching TV in the bedroom does not keep her  awake with his habits. She goes to bed between 9 and 9:30 PM and sleeps usually through to 6 AM. She wakes more restored and refreshed now again without headaches and the dry mouth is no longer an issue.  I would like for Lori Shaw to undergo an HLA narcolepsy test. Her sleepiness is out of proportion to the residual apnea as she presents with, looking for another reason for her sleepiness besides obstructive sleep apnea. She can take up 10 minute power nap and feels great again she reports. At nighttime she does not recall any dreams no nightmares and no vivid dreams especially. She does report occasional waking up being unable to open her eyes being paralyzed.She has never experienced cataplexy due to anger, startling or loud laughing.  Interval history on 09/17/2015 for Lori Shaw, Lori Shaw HL a narcolepsy panel returned negative, her Epworth sleepiness score however has been reduced to 6 points, fatigue severity 32 points and the geriatric depression score was clinically insignificant at 1 out of 15 points. She has been very compliant with her CPAP use 100% of the last 30 days with an average user time of 8 hours and 26 minutes, the machine  is set at 9 cm water with 2 cm EPR, the residual AHI is 1.1. There have been no major air leaks. I conclude that the CPAP therapy does reduce her daytime sleepiness significantly. The couple has been very active this summer and traveled through [place] and [place]".  Sleep medical history and family sleep history:  Her brothers have CPAP machine, sister that snores, but was not tested, both parents had witnessed apnea, untreated - both died of heart disease Social history: remarried. Her husband has MS.  2 beverages with caffeine a day  , rare alcohol drinker, former smoker.    Review of Systems: Out of a complete 14 system review, the patient complains of only the following symptoms, and all other reviewed systems are negative. See above   Epworth score   6 from 17. Keep on CPAP. HLA negative.   Social History   Social History  . Marital status: Married    Spouse name: N/A  . Number of children: 2  . Years of education: 1.5   Occupational History  . retired    Social History Main Topics  . Smoking status: Former Smoker    Packs/day: 2.00    Years: 20.00    Types: Cigarettes    Quit date: 01/26/1978  . Smokeless tobacco: Never Used  . Alcohol use No  . Drug use: No  . Sexual activity: Yes   Other Topics Concern  . Not on file   Social History Narrative   Lives with daughter, 1 dog   Occupation: retired, has worked in Chief Strategy Officer, also Forensic psychologist   Edu: 1.5 yrs college   Activity: square dancing   Diet: good water, fruits/vegetables daily   Patient drinks two cups of caffeine daily.   Remarried.      Family History  Problem Relation Age of Onset  . Arthritis Maternal Grandmother   . Hyperlipidemia Mother   . Hyperlipidemia Father   . CAD Mother     CABG  . CAD Father     MI  . Cancer Sister     breast  . Diabetes Neg Hx   . Stroke Neg Hx   . Alzheimer's disease    . Heart attack      Past Medical History:  Diagnosis Date  . HLD (hyperlipidemia)   . OSA on CPAP 2006, 04/2014   AHI of 23.8 and an RDI of 42.9, on CPAP 9 cm H2O ([facility])  . Osteopenia 08/2010   T -1.4 at L femur neck    Past Surgical History:  Procedure Laterality Date  . ABDOMINAL HYSTERECTOMY  01/27/1983   for menometrorrhagia, one ovary remained  . dexa  08/2010   osteopenia  . TONSILLECTOMY  01/26/1973    Current Outpatient Prescriptions  Medication Sig Dispense Refill  . cholecalciferol (VITAMIN D) 1000 UNITS tablet Take 1,000 Units by mouth daily. Reported on 06/06/2015    . fluticasone (FLONASE) 50 MCG/ACT nasal spray Place 2 sprays  into both nostrils daily as needed. 16 g 11   No current facility-administered medications for this visit.     Allergies as of 09/17/2015 - Review Complete 09/17/2015  Allergen Reaction Noted  . Sulfa antibiotics  01/17/2013    Vitals: BP 100/70   Pulse 60   Resp 20   Ht 5' 6"  (1.676 m)   Wt 202 lb (91.6 kg)   BMI 32.60 kg/m  Last Weight:  Wt Readings from Last 1 Encounters:  09/17/15 202 lb (91.6 kg)  VVK:PQAE mass index is 32.6 kg/m.     Last Height:   Ht Readings from Last 1 Encounters:  09/17/15 5' 6"  (1.676 m)    Physical exam:  General: The patient is awake, alert and appears not in acute distress. The patient is well groomed. Head: Normocephalic, atraumatic. Neck is supple. Mallampati 3   neck circumference:15. Nasal airflow restricted , TMJ is not  evident . Retrognathia is not seen.  Cardiovascular:  Regular rate and rhythm , without  murmurs or carotid bruit, and without distended neck veins. Respiratory: Lungs are clear to auscultation. Skin:  Without evidence of edema, or rash Trunk: BMI is 33. The patient's posture is erect    Neurologic exam : The patient is awake and alert, oriented to place and time.   Attention span & concentration ability appears normal.  Speech is fluent, without dysarthria, dysphonia or aphasia.  Mood and affect are appropriate.  Cranial nerves: Pupils are equal and briskly reactive to light. Extraocular movements  in vertical and horizontal planes intact and without nystagmus. Visual fields by finger perimetry are intact. . Facial sensation intact to fine touch. Facial motor strength is symmetric and tongue and uvula move midline. Shoulder shrug was symmetrical.   Deep tendon reflexes: in the  upper and lower extremities are symmetric and intact. Babinski maneuver response is  downgoing.  The patient was advised of the nature of the diagnosed sleep disorder , the treatment options and risks for general a health and wellness  arising from not treating the condition.  I spent more than 20 minutes of face to face time with the patient. Greater than 50% of time was spent in counseling and coordination of care. We have discussed the diagnosis and differential and I answered the patient's questions.     Assessment:  After physical and neurologic examination, review of laboratory studies,  Personal review of imaging studies, reports of other /same  Imaging studies ,  Results of polysomnography/ neurophysiology testing and pre-existing records as far as provided in visit., my assessment is   1) obstructive sleep apnea confirmed in a polysomnography by split night protocol on 05-22-14.  The patient had an AHI of 23.8 and an RDI of 42.9. This verifies her history of thunderous snoring. She had many more snoring related events than actual true apneas. CPAP was titrated to 9 cm water pressure and alleviated the AHI. She now has responded with an Epworth of 6 !    2)obesity is main risk factor. Patient well aware but frustrated with diet results. Offered medical diet. Rv in 12 month     Asencion Partridge Natara Monfort MD  09/17/2015   CC: Ria Bush, [place] [address] [place], [place] [address]

## 2015-09-22 ENCOUNTER — Other Ambulatory Visit: Payer: Self-pay | Admitting: Family Medicine

## 2015-09-22 DIAGNOSIS — R7989 Other specified abnormal findings of blood chemistry: Secondary | ICD-10-CM

## 2015-09-22 DIAGNOSIS — E785 Hyperlipidemia, unspecified: Secondary | ICD-10-CM

## 2015-09-24 ENCOUNTER — Other Ambulatory Visit (INDEPENDENT_AMBULATORY_CARE_PROVIDER_SITE_OTHER): Payer: Medicare HMO

## 2015-09-24 DIAGNOSIS — R7989 Other specified abnormal findings of blood chemistry: Secondary | ICD-10-CM

## 2015-09-24 DIAGNOSIS — E785 Hyperlipidemia, unspecified: Secondary | ICD-10-CM

## 2015-09-24 LAB — BASIC METABOLIC PANEL
BUN: 17 mg/dL (ref 6–23)
CALCIUM: 8.7 mg/dL (ref 8.4–10.5)
CO2: 28 mEq/L (ref 19–32)
CREATININE: 0.82 mg/dL (ref 0.40–1.20)
Chloride: 107 mEq/L (ref 96–112)
GFR: 73.44 mL/min (ref >60.00)
GLUCOSE: 91 mg/dL (ref 70–99)
POTASSIUM: 4.5 meq/L (ref 3.5–5.1)
Sodium: 141 mEq/L (ref 135–145)

## 2015-09-24 LAB — T4, FREE: Free T4: 0.78 ng/dL (ref 0.60–1.60)

## 2015-09-24 LAB — LIPID PANEL
CHOLESTEROL: 224 mg/dL — AB (ref 0–200)
HDL: 40.1 mg/dL (ref >39.00)
NonHDL: 183.48
TRIGLYCERIDES: 217 mg/dL — AB (ref 0.0–149.0)
Total CHOL/HDL Ratio: 6
VLDL: 43.4 mg/dL — AB (ref 0.0–40.0)

## 2015-09-24 LAB — LDL CHOLESTEROL, DIRECT: Direct LDL: 143 mg/dL

## 2015-09-24 LAB — TSH: TSH: 1.28 u[IU]/mL (ref 0.35–4.50)

## 2015-09-25 ENCOUNTER — Other Ambulatory Visit: Payer: Self-pay | Admitting: Family Medicine

## 2015-09-25 DIAGNOSIS — Z1231 Encounter for screening mammogram for malignant neoplasm of breast: Secondary | ICD-10-CM

## 2015-10-01 ENCOUNTER — Encounter: Payer: Self-pay | Admitting: *Deleted

## 2015-10-17 ENCOUNTER — Other Ambulatory Visit: Payer: Self-pay | Admitting: Family Medicine

## 2015-10-17 ENCOUNTER — Ambulatory Visit
Admission: RE | Admit: 2015-10-17 | Discharge: 2015-10-17 | Disposition: A | Discharge status: Home / Self Care | Payer: Medicare HMO | Source: Ambulatory Visit | Attending: Family Medicine | Admitting: Family Medicine

## 2015-10-17 DIAGNOSIS — Z1231 Encounter for screening mammogram for malignant neoplasm of breast: Secondary | ICD-10-CM

## 2015-10-18 LAB — HM MAMMOGRAPHY

## 2015-10-21 ENCOUNTER — Encounter: Payer: Self-pay | Admitting: *Deleted

## 2016-01-08 DIAGNOSIS — R69 Illness, unspecified: Secondary | ICD-10-CM | POA: Diagnosis not present

## 2016-01-30 DIAGNOSIS — G4733 Obstructive sleep apnea (adult) (pediatric): Secondary | ICD-10-CM | POA: Diagnosis not present

## 2016-03-05 DIAGNOSIS — R69 Illness, unspecified: Secondary | ICD-10-CM | POA: Diagnosis not present

## 2016-04-27 ENCOUNTER — Ambulatory Visit (INDEPENDENT_AMBULATORY_CARE_PROVIDER_SITE_OTHER): Payer: Medicare HMO | Admitting: Family Medicine

## 2016-04-27 DIAGNOSIS — H6983 Other specified disorders of Eustachian tube, bilateral: Secondary | ICD-10-CM

## 2016-04-27 DIAGNOSIS — H6980 Other specified disorders of Eustachian tube, unspecified ear: Secondary | ICD-10-CM | POA: Insufficient documentation

## 2016-04-27 NOTE — Progress Notes (Signed)
Subjective:   Patient ID: Lori Shaw, female    DOB: Jul 10, 1945, 71 y.o.   MRN: [id number]  Lori Shaw is a pleasant 71 y.o. year old female who presents to clinic today with Acute Visit ([place] FEELS LIKE RIDING IN  TUNNEL)  on 04/27/2016  HPI:  For the past week, feels like she is in a tunnel. Both ears popping, fulll of pressure.  She does have seasonal allergies- using flonase.  No cough.  Does not feel "all that congested."  No fevers, chills or sinus pressure.  No drainage or pain from her ear.  Going on a trip this weekend (will be on an airplane) and wants to make sure her ears are ok before she goes.  Current Outpatient Prescriptions on File Prior to Visit  Medication Sig Dispense Refill  . cholecalciferol (VITAMIN D) 1000 UNITS tablet Take 1,000 Units by mouth daily. Reported on 06/06/2015    . fluticasone (FLONASE) 50 MCG/ACT nasal spray Place 2 sprays into both nostrils daily as needed. 16 g 11   No current facility-administered medications on file prior to visit.     Allergies  Allergen Reactions  . Sulfa Antibiotics     Past Medical History:  Diagnosis Date  . HLD (hyperlipidemia)   . OSA on CPAP 2006, 04/2014   AHI of 23.8 and an RDI of 42.9, on CPAP 9 cm H2O ([facility])  . Osteopenia 08/2010   T -1.4 at L femur neck    Past Surgical History:  Procedure Laterality Date  . ABDOMINAL HYSTERECTOMY  01/27/1983   for menometrorrhagia, one ovary remained  . dexa  08/2010   osteopenia  . TONSILLECTOMY  01/26/1973    Family History  Problem Relation Age of Onset  . Hyperlipidemia Mother   . CAD Mother     CABG  . Hyperlipidemia Father   . CAD Father     MI  . Arthritis Maternal Grandmother   . Cancer Sister     breast  . Breast cancer Sister 100  . Alzheimer's disease    . Heart attack    . Diabetes Neg Hx   . Stroke Neg Hx     Social History   Social History  . Marital status: Married    Spouse name: N/A  . Number of  children: 2  . Years of education: 1.5   Occupational History  . retired    Social History Main Topics  . Smoking status: Former Smoker    Packs/day: 2.00    Years: 20.00    Types: Cigarettes    Quit date: 01/26/1978  . Smokeless tobacco: Never Used  . Alcohol use No  . Drug use: No  . Sexual activity: Yes   Other Topics Concern  . Not on file   Social History Narrative   Lives with daughter, 1 dog   Occupation: retired, has worked in Chief Strategy Officer, also Forensic psychologist   Edu: 1.5 yrs college   Activity: square dancing   Diet: good water, fruits/vegetables daily   Patient drinks two cups of caffeine daily.   Remarried.     The PMH, PSH, Social History, Family History, Medications, and allergies have been reviewed in [facility], and have been updated if relevant.   Review of Systems  Constitutional: Negative.   HENT: Positive for ear pain and postnasal drip. Negative for congestion, dental problem, drooling, ear discharge, facial swelling, hearing loss, mouth sores, nosebleeds and rhinorrhea.   Respiratory: Negative.  Cardiovascular: Negative.   All other systems reviewed and are negative.      Objective:    BP 102/68   Pulse 82   Wt 206 lb (93.4 kg)   SpO2 97%   BMI 33.25 kg/m    Physical Exam  Constitutional: She is oriented to person, place, and time. She appears well-developed and well-nourished. No distress.  HENT:  Head: Normocephalic and atraumatic.  Right Ear: A middle ear effusion is present.  Left Ear: A middle ear effusion is present.  Eyes: Conjunctivae are normal.  Cardiovascular: Normal rate.   Pulmonary/Chest: Effort normal.  Musculoskeletal: Normal range of motion. She exhibits no edema.  Neurological: She is alert and oriented to person, place, and time. No cranial nerve deficit.  Skin: Skin is warm and dry. She is not diaphoretic.  Psychiatric: She has a normal mood and affect. Her behavior is normal. Judgment and thought content normal.    Nursing note and vitals reviewed.         Assessment & Plan:   Dysfunction of both eustachian tubes No Follow-up on file.

## 2016-04-27 NOTE — Assessment & Plan Note (Signed)
New- continue flonase. Advised to add sudafed. Call or return to clinic prn if these symptoms worsen or fail to improve as anticipated. The patient indicates understanding of these issues and agrees with the plan.

## 2016-04-27 NOTE — Patient Instructions (Addendum)
Start taking Sudafed- you have to ask for this ("behind the counter")- either plain sudafed or allegra D or claritin D.   Continue Flonase.   Eustachian Tube Dysfunction The eustachian tube connects the middle ear to the back of the nose. It regulates air pressure in the middle ear by allowing air to move between the ear and nose. It also helps to drain fluid from the middle ear space. When the eustachian tube does not function properly, air pressure, fluid, or both can build up in the middle ear. Eustachian tube dysfunction can affect one or both ears. What are the causes? This condition happens when the eustachian tube becomes blocked or cannot open normally. This may result from:  Ear infections.  Colds and other upper respiratory infections.  Allergies.  Irritation, such as from cigarette smoke or acid from the stomach coming up into the esophagus (gastroesophageal reflux).  Sudden changes in air pressure, such as from descending in an airplane.  Abnormal growths in the nose or throat, such as nasal polyps, tumors, or enlarged tissue at the back of the throat (adenoids). What increases the risk? This condition may be more likely to develop in people who smoke and people who are overweight. Eustachian tube dysfunction may also be more likely to develop in children, especially children who have:  Certain birth defects of the mouth, such as cleft palate.  Large tonsils and adenoids. What are the signs or symptoms? Symptoms of this condition may include:  A feeling of fullness in the ear.  Ear pain.  Clicking or popping noises in the ear.  Ringing in the ear.  Hearing loss.  Loss of balance. Symptoms may get worse when the air pressure around you changes, such as when you travel to an area of high elevation or fly on an airplane. How is this diagnosed? This condition may be diagnosed based on:  Your symptoms.  A physical exam of your ear, nose, and throat.  Tests,  such as those that measure:  The movement of your eardrum (tympanogram).  Your hearing (audiometry). How is this treated? Treatment depends on the cause and severity of your condition. If your symptoms are mild, you may be able to relieve your symptoms by moving air into ("popping") your ears. If you have symptoms of fluid in your ears, treatment may include:  Decongestants.  Antihistamines.  Nasal sprays or ear drops that contain medicines that reduce swelling (steroids). In some cases, you may need to have a procedure to drain the fluid in your eardrum (myringotomy). In this procedure, a small tube is placed in the eardrum to:  Drain the fluid.  Restore the air in the middle ear space. Follow these instructions at home:  Take over-the-counter and prescription medicines only as told by your health care provider.  Use techniques to help pop your ears as recommended by your health care provider. These may include:  Chewing gum.  Yawning.  Frequent, forceful swallowing.  Closing your mouth, holding your nose closed, and gently blowing as if you are trying to blow air out of your nose.  Do not do any of the following until your health care provider approves:  Travel to high altitudes.  Fly in airplanes.  Work in a Pension scheme manager or room.  Scuba dive.  Keep your ears dry. Dry your ears completely after showering or bathing.  Do not smoke.  Keep all follow-up visits as told by your health care provider. This is important. Contact a health care  provider if:  Your symptoms do not go away after treatment.  Your symptoms come back after treatment.  You are unable to pop your ears.  You have:  A fever.  Pain in your ear.  Pain in your head or neck.  Fluid draining from your ear.  Your hearing suddenly changes.  You become very dizzy.  You lose your balance. This information is not intended to replace advice given to you by your health care provider. Make  sure you discuss any questions you have with your health care provider. Document Released: 02/08/2015 Document Revised: 06/20/2015 Document Reviewed: 01/31/2014 Elsevier Interactive Patient Education  2017 [employer].

## 2016-05-01 DIAGNOSIS — G4733 Obstructive sleep apnea (adult) (pediatric): Secondary | ICD-10-CM | POA: Diagnosis not present

## 2016-06-18 ENCOUNTER — Ambulatory Visit (INDEPENDENT_AMBULATORY_CARE_PROVIDER_SITE_OTHER): Payer: Medicare HMO | Admitting: Family Medicine

## 2016-06-18 ENCOUNTER — Encounter: Payer: Self-pay | Admitting: Family Medicine

## 2016-06-18 ENCOUNTER — Encounter (INDEPENDENT_AMBULATORY_CARE_PROVIDER_SITE_OTHER): Payer: Self-pay

## 2016-06-18 VITALS — BP 104/66 | HR 68 | Temp 97.9°F | Ht 66.0 in | Wt 208.2 lb

## 2016-06-18 DIAGNOSIS — R5383 Other fatigue: Secondary | ICD-10-CM

## 2016-06-18 DIAGNOSIS — E785 Hyperlipidemia, unspecified: Secondary | ICD-10-CM | POA: Diagnosis not present

## 2016-06-18 NOTE — Assessment & Plan Note (Signed)
2-3 wk h/o worsening fatigue associated with some daytime sleepiness without any other localizing signs or symptoms. Denies any major change in lifestyle or sleep habits or change in CPAP settings. Denies significant mood issues.  I have asked her to return tomorrow for her physical labs as well as fatigue evaluation. If unrevealing, I suggested return to Dr Brett Fairy prior to 08/2016 scheduled appt for recheck CPAP settings.

## 2016-06-18 NOTE — Patient Instructions (Addendum)
Return tomorrow for fasting labs - these will also be physical labs so may cancel that lab visit.  If all normal, I recommend return to Dr Brett Fairy.  Good to see you today, call us with questions.

## 2016-06-18 NOTE — Progress Notes (Signed)
BP 104/66   Pulse 68   Temp 97.9 F (36.6 C)   Ht 5\' 6"  (1.676 m)   Wt 208 lb 4 oz (94.5 kg)   SpO2 98%   BMI 33.61 kg/m    CC: no energy x 2-3 wks Subjective:    Patient ID: [place], female    DOB: [birth year], 71 y.o.   MRN: [id number]  HPI: Lori Shaw is a 71 y.o. female presenting on 06/18/2016 for Acute Visit (no energy just wants to sleep all the time  x 2-3 weeks)   2-3 wk h/o fatigue "I'm drained - I want to sleep all the time". Bedtime 9pm, wakes up at 7am. This all started after trip to [place] - sight seeing and helping newly established church. Sleeps well. Mild depressed mood due to fatigue. Significant daytime sleepiness as well.   No fevers/chills, chest pain, dyspnea, headaches, palpitations, dizziness. No new joint pains, abdominal pain, bowel changes. No night sweats.  Denies tick bites.  No anhedonia. Concentration is ok. Appetite is good.   OSA on CPAP - sees Dr Brett Fairy yearly. Feels this is going well.   She has signed up for lifeline testing to be done in June.   Colon cancer screening - ~2010 per pt normal, rec rpt 10 yrs Well woman exam - s/p hysterectomy with 1 ovary remaining. No longer receives pap smears. Mammogram - 09/2015 birads1 - requests Q2 yr testing. Does breast exams at home.   Relevant past medical, surgical, family and social history reviewed and updated as indicated. Interim medical history since our last visit reviewed. Allergies and medications reviewed and updated. Outpatient Medications Prior to Visit  Medication Sig Dispense Refill  . cholecalciferol (VITAMIN D) 1000 UNITS tablet Take 1,000 Units by mouth daily. Reported on 06/06/2015    . fluticasone (FLONASE) 50 MCG/ACT nasal spray Place 2 sprays into both nostrils daily as needed. 16 g 11   No facility-administered medications prior to visit.      Per HPI unless specifically indicated in ROS section below Review of Systems     Objective:    BP 104/66    Pulse 68   Temp 97.9 F (36.6 C)   Ht 5\' 6"  (1.676 m)   Wt 208 lb 4 oz (94.5 kg)   SpO2 98%   BMI 33.61 kg/m   Wt Readings from Last 3 Encounters:  06/18/16 208 lb 4 oz (94.5 kg)  04/27/16 206 lb (93.4 kg)  09/17/15 202 lb (91.6 kg)    BP Readings from Last 3 Encounters:  06/18/16 104/66  04/27/16 102/68  09/17/15 100/70    Physical Exam  Constitutional: She appears well-developed and well-nourished. No distress.  HENT:  Head: Normocephalic and atraumatic.  Mouth/Throat: Oropharynx is clear and moist. No oropharyngeal exudate.  Eyes: Conjunctivae are normal. Pupils are equal, round, and reactive to light.  Neck: Normal range of motion. Neck supple. No thyromegaly present.  Cardiovascular: Normal rate, regular rhythm, normal heart sounds and intact distal pulses.   No murmur heard. Pulmonary/Chest: Effort normal and breath sounds normal. No respiratory distress. She has no wheezes. She has no rales.  Musculoskeletal: She exhibits no edema.  Lymphadenopathy:    She has no cervical adenopathy.  Skin: Skin is warm and dry. No rash noted.  Psychiatric: She has a normal mood and affect.  Nursing note and vitals reviewed.  Results for orders placed or performed in visit on 10/21/15  [facility] MAMMOGRAPHY  Result Value Ref Range  [facility] Mammogram 0-4 Bi-Rad 0-4 Bi-Rad, Self Reported Normal      Assessment & Plan:   Problem List Items Addressed This Visit    Fatigue - Primary    2-3 wk h/o worsening fatigue associated with some daytime sleepiness without any other localizing signs or symptoms. Denies any major change in lifestyle or sleep habits or change in CPAP settings. Denies significant mood issues.  I have asked her to return tomorrow for her physical labs as well as fatigue evaluation. If unrevealing, I suggested return to Dr Brett Fairy prior to 08/2016 scheduled appt for recheck CPAP settings.       Relevant Orders   Vitamin B12   VITAMIN D 25 Hydroxy (Vit-D Deficiency,  Fractures)   TSH   Comprehensive metabolic panel   CBC with Differential/Platelet   T4, free   HLD (hyperlipidemia)   Relevant Orders   Lipid panel   TSH   Comprehensive metabolic panel       Follow up plan: No Follow-up on file.  Ria Bush, MD

## 2016-06-19 ENCOUNTER — Other Ambulatory Visit (INDEPENDENT_AMBULATORY_CARE_PROVIDER_SITE_OTHER): Payer: Medicare HMO

## 2016-06-19 DIAGNOSIS — R5383 Other fatigue: Secondary | ICD-10-CM | POA: Diagnosis not present

## 2016-06-19 DIAGNOSIS — E785 Hyperlipidemia, unspecified: Secondary | ICD-10-CM | POA: Diagnosis not present

## 2016-06-19 LAB — COMPREHENSIVE METABOLIC PANEL
ALBUMIN: 4.4 g/dL (ref 3.5–5.2)
ALT: 24 U/L (ref 0–35)
AST: 18 U/L (ref 0–37)
Alkaline Phosphatase: 66 U/L (ref 39–117)
BILIRUBIN TOTAL: 0.6 mg/dL (ref 0.2–1.2)
BUN: 14 mg/dL (ref 6–23)
CALCIUM: 9.4 mg/dL (ref 8.4–10.5)
CHLORIDE: 106 meq/L (ref 96–112)
CO2: 29 meq/L (ref 19–32)
Creatinine, Ser: 0.85 mg/dL (ref 0.40–1.20)
GFR: 70.31 mL/min (ref >60.00)
Glucose, Bld: 98 mg/dL (ref 70–99)
Potassium: 4.3 mEq/L (ref 3.5–5.1)
Sodium: 141 mEq/L (ref 135–145)
Total Protein: 6.5 g/dL (ref 6.0–8.3)

## 2016-06-19 LAB — CBC WITH DIFFERENTIAL/PLATELET
Basophils Absolute: 0.1 10*3/uL (ref 0.0–0.1)
Basophils Relative: 0.4 % (ref 0.0–3.0)
Eosinophils Absolute: 0.3 10*3/uL (ref 0.0–0.7)
Eosinophils Relative: 2.5 % (ref 0.0–5.0)
HEMATOCRIT: 45.2 % (ref 36.0–46.0)
HEMOGLOBIN: 15.1 g/dL — AB (ref 12.0–15.0)
Lymphs Abs: 8.5 10*3/uL — ABNORMAL HIGH (ref 0.7–4.0)
MCHC: 33.4 g/dL (ref 30.0–36.0)
MCV: 90.8 fl (ref 78.0–100.0)
MONOS PCT: 5.4 % (ref 3.0–12.0)
Monocytes Absolute: 0.7 10*3/uL (ref 0.1–1.0)
NEUTROS ABS: 3.9 10*3/uL (ref 1.4–7.7)
Neutrophils Relative %: 28.8 % — ABNORMAL LOW (ref 43.0–77.0)
Platelets: 198 10*3/uL (ref 150.0–400.0)
RBC: 4.98 Mil/uL (ref 3.87–5.11)
RDW: 14.2 % (ref 11.5–15.5)
WBC: 13.6 10*3/uL — AB (ref 4.0–10.5)

## 2016-06-19 LAB — LIPID PANEL
Cholesterol: 216 mg/dL — ABNORMAL HIGH (ref 0–200)
HDL: 41.4 mg/dL (ref >39.00)
NonHDL: 174.45
Total CHOL/HDL Ratio: 5
Triglycerides: 214 mg/dL — ABNORMAL HIGH (ref 0.0–149.0)
VLDL: 42.8 mg/dL — ABNORMAL HIGH (ref 0.0–40.0)

## 2016-06-19 LAB — TSH: TSH: 3.46 u[IU]/mL (ref 0.35–4.50)

## 2016-06-19 LAB — VITAMIN D 25 HYDROXY (VIT D DEFICIENCY, FRACTURES): VITD: 15.72 ng/mL — AB (ref 30.00–100.00)

## 2016-06-19 LAB — VITAMIN B12: VITAMIN B 12: 164 pg/mL — AB (ref 211–911)

## 2016-06-19 LAB — LDL CHOLESTEROL, DIRECT: LDL DIRECT: 139 mg/dL

## 2016-06-19 LAB — T4, FREE: Free T4: 0.85 ng/dL (ref 0.60–1.60)

## 2016-06-21 ENCOUNTER — Other Ambulatory Visit: Payer: Self-pay | Admitting: Family Medicine

## 2016-06-21 DIAGNOSIS — C9110 Chronic lymphocytic leukemia of B-cell type not having achieved remission: Secondary | ICD-10-CM | POA: Insufficient documentation

## 2016-06-21 DIAGNOSIS — D72820 Lymphocytosis (symptomatic): Secondary | ICD-10-CM

## 2016-06-21 DIAGNOSIS — E538 Deficiency of other specified B group vitamins: Secondary | ICD-10-CM | POA: Insufficient documentation

## 2016-06-21 HISTORY — DX: Chronic lymphocytic leukemia of B-cell type not having achieved remission: C91.10

## 2016-06-25 ENCOUNTER — Other Ambulatory Visit: Payer: Medicare HMO

## 2016-06-30 ENCOUNTER — Encounter: Payer: Self-pay | Admitting: Family Medicine

## 2016-06-30 ENCOUNTER — Ambulatory Visit (INDEPENDENT_AMBULATORY_CARE_PROVIDER_SITE_OTHER): Payer: Medicare HMO | Admitting: Family Medicine

## 2016-06-30 VITALS — BP 126/72 | HR 66 | Temp 97.6°F | Ht 64.25 in | Wt 206.2 lb

## 2016-06-30 DIAGNOSIS — E559 Vitamin D deficiency, unspecified: Secondary | ICD-10-CM | POA: Diagnosis not present

## 2016-06-30 DIAGNOSIS — E669 Obesity, unspecified: Secondary | ICD-10-CM | POA: Diagnosis not present

## 2016-06-30 DIAGNOSIS — E785 Hyperlipidemia, unspecified: Secondary | ICD-10-CM

## 2016-06-30 DIAGNOSIS — Z7189 Other specified counseling: Secondary | ICD-10-CM | POA: Diagnosis not present

## 2016-06-30 DIAGNOSIS — D72820 Lymphocytosis (symptomatic): Secondary | ICD-10-CM | POA: Diagnosis not present

## 2016-06-30 DIAGNOSIS — E538 Deficiency of other specified B group vitamins: Secondary | ICD-10-CM

## 2016-06-30 DIAGNOSIS — M8580 Other specified disorders of bone density and structure, unspecified site: Secondary | ICD-10-CM | POA: Diagnosis not present

## 2016-06-30 DIAGNOSIS — Z0000 Encounter for general adult medical examination without abnormal findings: Secondary | ICD-10-CM | POA: Diagnosis not present

## 2016-06-30 DIAGNOSIS — R5383 Other fatigue: Secondary | ICD-10-CM

## 2016-06-30 MED ORDER — CYANOCOBALAMIN 1000 MCG/ML IJ SOLN: 1000.0000 ug | Freq: Once | INTRAMUSCULAR | 0 refills | Status: DC

## 2016-06-30 MED ORDER — CYANOCOBALAMIN 1000 MCG/ML IJ SOLN
1000.0000 ug | Freq: Once | INTRAMUSCULAR | Status: AC
Start: 2016-06-30 — End: 2016-06-30
  Administered 2016-06-30: 1000 ug via INTRAMUSCULAR

## 2016-06-30 NOTE — Assessment & Plan Note (Signed)
Preventative protocols reviewed and updated unless pt declined. Discussed healthy diet and lifestyle.  

## 2016-06-30 NOTE — Assessment & Plan Note (Signed)
rec start 2000 IU daily.  

## 2016-06-30 NOTE — Assessment & Plan Note (Signed)
Discussed healthy diet and lifestyle change to affect sustainable weight loss.

## 2016-06-30 NOTE — Assessment & Plan Note (Addendum)
Improving - possible b12 def related. B12 shot today, then start monthly shots x 6 months.  Check CBC, periph smear today as well as monospot.

## 2016-06-30 NOTE — Assessment & Plan Note (Signed)
b12 shot today then continue monthly shots x 6 months.

## 2016-06-30 NOTE — Assessment & Plan Note (Signed)
Monocytosis, lymphocytosis, with atypical lymphs on CBC in setting of increased fatigue. Update labs today - CBC, periph smear, monospot today. Discussed possible viral infection vs blood abnormality. If persistent abnormality and monospot negative, low threshold to check lymphocyte flow cytometry.

## 2016-06-30 NOTE — Assessment & Plan Note (Signed)
Consider updated DEXA at next mammogram appt.  rec start vit D 2000 IU daily.

## 2016-06-30 NOTE — Progress Notes (Signed)
BP 126/72   Pulse 66   Temp 97.6 F (36.4 C) (Oral)   Ht 5' 4.25" (1.632 m)   Wt 206 lb 4 oz (93.6 kg)   SpO2 94%   BMI 35.13 kg/m    CC: CPE Subjective:    Patient ID: Lori Shaw, female    DOB: [birth year], 71 y.o.   MRN: [id number]  HPI: Lori Shaw is a 71 y.o. female presenting on 06/30/2016 for Annual Exam (Pt states she would like to disciss recent lab work)   See prior note for details. Seen here 2 wks ago with several weeks of fatigue. Overall feeling better with addition of b12 1076mcg daily but remains fatigued. Denies night sweats, unexpected weight loss.   Fall screen - passed Depression screen - passed Hearing screen - passed Vision screen - passed  Preventative: Colon cancer screening - ~2010 per pt normal, rec rpt 10 yrs Well woman exam - s/p hysterectomy with 1 ovary remaining. No longer receives pap smears. Mammogram - 09/2015 birads1 - requests Q2 yr testing. Does breast exams at home.  Dexa Date: 08/2010 osteopenia. Drinks milk and eats yogurt daily. H/o low vit D in past. Discussed DEXA next mammogram.  Flu shot yearly Pneumovax 10/2012 , prevnar 2017 Tdap 03/2006 zostavax 2013  Shingrix - discussed Advanced directives: she has this at home. Would want new husband to be HCPOA Lori Shaw). Asked to bring [name] copy. Seat belt use discussed Sunscreen use discussed. No changing moles on skin. 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 Working on [employer].   Relevant past medical, surgical, family and social history reviewed and updated as indicated. Interim medical history since our last visit reviewed. Allergies and medications reviewed and updated. Outpatient Medications Prior to Visit  Medication Sig  Dispense Refill  . fluticasone (FLONASE) 50 MCG/ACT nasal spray Place 2 sprays into both nostrils daily as needed. 16 g 11  . cholecalciferol (VITAMIN D) 1000 UNITS tablet Take 1,000 Units by mouth daily. Reported on 06/06/2015     No facility-administered medications prior to visit.      Per HPI unless specifically indicated in ROS section below Review of Systems  Constitutional: Negative for activity change, appetite change, chills, fatigue, fever and unexpected weight change.  HENT: Negative for hearing loss.   Eyes: Negative for visual disturbance.  Respiratory: Negative for cough, chest tightness, shortness of breath and wheezing.   Cardiovascular: Negative for chest pain, palpitations and leg swelling.  Gastrointestinal: Negative for abdominal distention, abdominal pain, blood in stool, constipation, diarrhea, nausea and vomiting.  Genitourinary: Negative for difficulty urinating and hematuria.  Musculoskeletal: Negative for arthralgias, myalgias and neck pain.  Skin: Negative for rash.  Neurological: Negative for dizziness, seizures, syncope and headaches.  Hematological: Negative for adenopathy. Does not bruise/bleed easily.  Psychiatric/Behavioral: Negative for dysphoric mood. The patient is not nervous/anxious.        Objective:    BP 126/72   Pulse 66   Temp 97.6 F (36.4 C) (Oral)   Ht 5' 4.25" (1.632 m)   Wt 206 lb 4 oz (93.6 kg)   SpO2 94%   BMI 35.13 kg/m   Wt Readings from Last 3 Encounters:  06/30/16 206 lb 4 oz (93.6 kg)  06/18/16 208 lb  4 oz (94.5 kg)  04/27/16 206 lb (93.4 kg)    Physical Exam  Constitutional: She is oriented to person, place, and time. She appears well-developed and well-nourished. No distress.  HENT:  Head: Normocephalic and atraumatic.  Right Ear: Hearing, tympanic membrane, external ear and ear canal normal.  Left Ear: Hearing, tympanic membrane, external ear and ear canal normal.  Nose: Nose normal.  Mouth/Throat: Uvula is  midline, oropharynx is clear and moist and mucous membranes are normal. No oropharyngeal exudate, posterior oropharyngeal edema or posterior oropharyngeal erythema.  Eyes: Conjunctivae and EOM are normal. Pupils are equal, round, and reactive to light. No scleral icterus.  Neck: Normal range of motion. Neck supple. Carotid bruit is not present. No thyromegaly present.  Cardiovascular: Normal rate, regular rhythm, normal heart sounds and intact distal pulses.   No murmur heard. Pulses:      Radial pulses are 2+ on the right side, and 2+ on the left side.  Pulmonary/Chest: Effort normal and breath sounds normal. No respiratory distress. She has no wheezes. She has no rales.  Abdominal: Soft. Normal appearance and bowel sounds are normal. She exhibits no distension and no mass. There is no hepatosplenomegaly. There is no tenderness. There is no rigidity, no rebound, no guarding, no CVA tenderness and negative Murphy's sign.  Musculoskeletal: Normal range of motion. She exhibits no edema.  Lymphadenopathy:    She has no cervical adenopathy.  Neurological: She is alert and oriented to person, place, and time.  CN grossly intact, station and gait intact  Skin: Skin is warm and dry. No rash noted.  Psychiatric: She has a normal mood and affect. Her behavior is normal. Judgment and thought content normal.  Nursing note and vitals reviewed.  Results for orders placed or performed in visit on 06/19/16  Vitamin B12  Result Value Ref Range   Vitamin B-12 164 (L) 211 - 911 pg/mL  VITAMIN D 25 Hydroxy (Vit-D Deficiency, Fractures)  Result Value Ref Range   VITD 15.72 (L) 30.00 - 100.00 ng/mL  Lipid panel  Result Value Ref Range   Cholesterol 216 (H) 0 - 200 mg/dL   Triglycerides 214.0 (H) 0.0 - 149.0 mg/dL   HDL 41.40 >39.00 mg/dL   VLDL 42.8 (H) 0.0 - 40.0 mg/dL   Total CHOL/HDL Ratio 5    NonHDL 174.45   TSH  Result Value Ref Range   TSH 3.46 0.35 - 4.50 uIU/mL  Comprehensive metabolic panel    Result Value Ref Range   Sodium 141 135 - 145 mEq/L   Potassium 4.3 3.5 - 5.1 mEq/L   Chloride 106 96 - 112 mEq/L   CO2 29 19 - 32 mEq/L   Glucose, Bld 98 70 - 99 mg/dL   BUN 14 6 - 23 mg/dL   Creatinine, Ser 0.85 0.40 - 1.20 mg/dL   Total Bilirubin 0.6 0.2 - 1.2 mg/dL   Alkaline Phosphatase 66 39 - 117 U/L   AST 18 0 - 37 U/L   ALT 24 0 - 35 U/L   Total Protein 6.5 6.0 - 8.3 g/dL   Albumin 4.4 3.5 - 5.2 g/dL   Calcium 9.4 8.4 - 10.5 mg/dL   GFR 70.31 >60.00 mL/min  CBC with Differential/Platelet  Result Value Ref Range   WBC 13.6 (H) 4.0 - 10.5 K/uL   RBC 4.98 3.87 - 5.11 Mil/uL   Hemoglobin 15.1 (H) 12.0 - 15.0 g/dL   HCT 45.2 36.0 - 46.0 %   MCV 90.8 78.0 -  100.0 fl   MCHC 33.4 30.0 - 36.0 g/dL   RDW 14.2 11.5 - 15.5 %   Platelets 198.0 150.0 - 400.0 K/uL   Neutrophils Relative % 28.8 (L) 43.0 - 77.0 %   Lymphocytes Relative 62.9 Repeated and verified X2. (H) 12.0 - 46.0 %   Monocytes Relative 5.4 3.0 - 12.0 %   Eosinophils Relative 2.5 0.0 - 5.0 %   Basophils Relative 0.4 0.0 - 3.0 %   Neutro Abs 3.9 1.4 - 7.7 K/uL   Lymphs Abs 8.5 (H) 0.7 - 4.0 K/uL   Monocytes Absolute 0.7 0.1 - 1.0 K/uL   Eosinophils Absolute 0.3 0.0 - 0.7 K/uL   Basophils Absolute 0.1 0.0 - 0.1 K/uL   Atypical Lymphocytes Manual Presence of (A) None  T4, free  Result Value Ref Range   Free T4 0.85 0.60 - 1.60 ng/dL  LDL cholesterol, direct  Result Value Ref Range   Direct LDL 139.0 mg/dL      Assessment & Plan:   Problem List Items Addressed This Visit    Advanced care planning/counseling discussion    Advanced directives: she has this at home. Would want new husband to be HCPOA Lori Shaw). Asked to bring [name] copy.      B12 deficiency    b12 shot today then continue monthly shots x 6 months.      Fatigue    Improving - possible b12 def related. B12 shot today, then start monthly shots x 6 months.  Check CBC, periph smear today as well as monospot.      Health  maintenance examination - Primary    Preventative protocols reviewed and updated unless pt declined. Discussed healthy diet and lifestyle.       Relevant Medications   cyanocobalamin ((VITAMIN B-12)) injection 1,000 mcg (Completed) (Start on 06/30/2016  4:15 PM)   HLD (hyperlipidemia)    Chronic, improving with mediterranean diet and off meds.  The 10-year ASCVD risk score Mikey Bussing [place] Brooke Bonito., et al., 2013) is: 9.1%   Values used to calculate the score:     Age: 74 years     Sex: Female     Is Non-Hispanic African American: No     Diabetic: No     Tobacco smoker: No     Systolic Blood Pressure: 638 mmHg     Is BP treated: No     HDL Cholesterol: 41.4 mg/dL     Total Cholesterol: 216 mg/dL       Lymphocytosis    Monocytosis, lymphocytosis, with atypical lymphs on CBC in setting of increased fatigue. Update labs today - CBC, periph smear, monospot today. Discussed possible viral infection vs blood abnormality. If persistent abnormality and monospot negative, low threshold to check lymphocyte flow cytometry.       Relevant Orders   CBC with Differential/Platelet   Obesity, Class I, BMI 30-34.9    Discussed healthy diet and lifestyle change to affect sustainable weight loss.       Osteopenia    Consider updated DEXA at next mammogram appt.  rec start vit D 2000 IU daily.       Vitamin D deficiency    rec start 2000 IU daily.          Follow up plan: Return in about 4 months (around 10/30/2016) for follow up visit.  Ria Bush, MD

## 2016-06-30 NOTE — Patient Instructions (Addendum)
Labwork today - we will be in touch with lab results.  b12 shot today - then come in once a month for next 6 months to continue b12 shots.  Start vitamin D 2000 units daily.  Bring [name] copy of your advanced directive.  Return as needed or in 4 months.   Health Maintenance, Female Adopting a healthy lifestyle and getting preventive care can go a long way to promote health and wellness. Talk with your health care provider about what schedule of regular examinations is right for you. This is a good chance for you to check in with your provider about disease prevention and staying healthy. In between checkups, there are plenty of things you can do on your own. Experts have done a lot of research about which lifestyle changes and preventive measures are most likely to keep you healthy. Ask your health care provider for more information. Weight and diet Eat a healthy diet  Be sure to include plenty of vegetables, fruits, low-fat dairy products, and lean protein.  Do not eat a lot of foods high in solid fats, added sugars, or salt.  Get regular exercise. This is one of the most important things you can do for your health. ? Most adults should exercise for at least 150 minutes each week. The exercise should increase your heart rate and make you sweat (moderate-intensity exercise). ? Most adults should also do strengthening exercises at least twice a week. This is in addition to the moderate-intensity exercise.  Maintain a healthy weight  Body mass index (BMI) is a measurement that can be used to identify possible weight problems. It estimates body fat based on height and weight. Your health care provider can help determine your BMI and help you achieve or maintain a healthy weight.  For females 39 years of age and older: ? A BMI below 18.5 is considered underweight. ? A BMI of 18.5 to 24.9 is normal. ? A BMI of 25 to 29.9 is considered overweight. ? A BMI of 30 and above is considered  obese.  Watch levels of cholesterol and blood lipids  You should start having your blood tested for lipids and cholesterol at 70 years of age, then have this test every 5 years.  You may need to have your cholesterol levels checked more often if: ? Your lipid or cholesterol levels are high. ? You are older than 70 years of age. ? You are at high risk for heart disease.  Cancer screening Lung Cancer  Lung cancer screening is recommended for adults 33-40 years old who are at high risk for lung cancer because of a history of smoking.  A yearly low-dose CT scan of the lungs is recommended for people who: ? Currently smoke. ? Have quit within the past 15 years. ? Have at least a 30-pack-year history of smoking. A pack year is smoking an average of one pack of cigarettes a day for 1 year.  Yearly screening should continue until it has been 15 years since you quit.  Yearly screening should stop if you develop a health problem that would prevent you from having lung cancer treatment.  Breast Cancer  Practice breast self-awareness. This means understanding how your breasts normally appear and feel.  It also means doing regular breast self-exams. Let your health care provider know about any changes, no matter how small.  If you are in your 20s or 30s, you should have a clinical breast exam (CBE) by a health care provider every 1-3  years as part of a regular health exam.  If you are 17 or older, have a CBE every year. Also consider having a breast X-ray (mammogram) every year.  If you have a family history of breast cancer, talk to your health care provider about genetic screening.  If you are at high risk for breast cancer, talk to your health care provider about having an MRI and a mammogram every year.  Breast cancer gene (BRCA) assessment is recommended for women who have family members with BRCA-related cancers. BRCA-related cancers  include: ? Breast. ? Ovarian. ? Tubal. ? Peritoneal cancers.  Results of the assessment will determine the need for genetic counseling and BRCA1 and BRCA2 testing.  Cervical Cancer Your health care provider may recommend that you be screened regularly for cancer of the pelvic organs (ovaries, uterus, and vagina). This screening involves a pelvic examination, including checking for microscopic changes to the surface of your cervix (Pap test). You may be encouraged to have this screening done every 3 years, beginning at age 33.  For women ages 99-65, health care providers may recommend pelvic exams and Pap testing every 3 years, or they may recommend the Pap and pelvic exam, combined with testing for human papilloma virus (HPV), every 5 years. Some types of HPV increase your risk of cervical cancer. Testing for HPV may also be done on women of any age with unclear Pap test results.  Other health care providers may not recommend any screening for nonpregnant women who are considered low risk for pelvic cancer and who do not have symptoms. Ask your health care provider if a screening pelvic exam is right for you.  If you have had past treatment for cervical cancer or a condition that could lead to cancer, you need Pap tests and screening for cancer for at least 20 years after your treatment. If Pap tests have been discontinued, your risk factors (such as having a new sexual partner) need to be reassessed to determine if screening should resume. Some women have medical problems that increase the chance of getting cervical cancer. In these cases, your health care provider may recommend more frequent screening and Pap tests.  Colorectal Cancer  This type of cancer can be detected and often prevented.  Routine colorectal cancer screening usually begins at 70 years of age and continues through 70 years of age.  Your health care provider may recommend screening at an earlier age if you have risk factors  for colon cancer.  Your health care provider may also recommend using home test kits to check for hidden blood in the stool.  A small camera at the end of a tube can be used to examine your colon directly (sigmoidoscopy or colonoscopy). This is done to check for the earliest forms of colorectal cancer.  Routine screening usually begins at age 69.  Direct examination of the colon should be repeated every 5-10 years through 70 years of age. However, you may need to be screened more often if early forms of precancerous polyps or small growths are found.  Skin Cancer  Check your skin from head to toe regularly.  Tell your health care provider about any new moles or changes in moles, especially if there is a change in a mole's shape or color.  Also tell your health care provider if you have a mole that is larger than the size of a pencil eraser.  Always use sunscreen. Apply sunscreen liberally and repeatedly throughout the day.  Protect yourself  by wearing long sleeves, pants, a wide-brimmed hat, and sunglasses whenever you are outside.  Heart disease, diabetes, and high blood pressure  High blood pressure causes heart disease and increases the risk of stroke. High blood pressure is more likely to develop in: ? People who have blood pressure in the high end of the normal range (130-139/85-89 mm Hg). ? People who are overweight or obese. ? People who are African American.  If you are 18-39 years of age, have your blood pressure checked every 3-5 years. If you are 40 years of age or older, have your blood pressure checked every year. You should have your blood pressure measured twice-once when you are at a hospital or clinic, and once when you are not at a hospital or clinic. Record the average of the two measurements. To check your blood pressure when you are not at a hospital or clinic, you can use: ? An automated blood pressure machine at a pharmacy. ? A home blood pressure monitor.  If  you are between 55 years and 79 years old, ask your health care provider if you should take aspirin to prevent strokes.  Have regular diabetes screenings. This involves taking a blood sample to check your fasting blood sugar level. ? If you are at a normal weight and have a low risk for diabetes, have this test once every three years after 70 years of age. ? If you are overweight and have a high risk for diabetes, consider being tested at a younger age or more often. Preventing infection Hepatitis B  If you have a higher risk for hepatitis B, you should be screened for this virus. You are considered at high risk for hepatitis B if: ? You were born in a country where hepatitis B is common. Ask your health care provider which countries are considered high risk. ? Your parents were born in a high-risk country, and you have not been immunized against hepatitis B (hepatitis B vaccine). ? You have HIV or AIDS. ? You use needles to inject street drugs. ? You live with someone who has hepatitis B. ? You have had sex with someone who has hepatitis B. ? You get hemodialysis treatment. ? You take certain medicines for conditions, including cancer, organ transplantation, and autoimmune conditions.  Hepatitis C  Blood testing is recommended for: ? Everyone born from 1945 through 1965. ? Anyone with known risk factors for hepatitis C.  Sexually transmitted infections (STIs)  You should be screened for sexually transmitted infections (STIs) including gonorrhea and chlamydia if: ? You are sexually active and are younger than 70 years of age. ? You are older than 70 years of age and your health care provider tells you that you are at risk for this type of infection. ? Your sexual activity has changed since you were last screened and you are at an increased risk for chlamydia or gonorrhea. Ask your health care provider if you are at risk.  If you do not have HIV, but are at risk, it may be recommended  that you take a prescription medicine daily to prevent HIV infection. This is called pre-exposure prophylaxis (PrEP). You are considered at risk if: ? You are sexually active and do not regularly use condoms or know the HIV status of your partner(s). ? You take drugs by injection. ? You are sexually active with a partner who has HIV.  Talk with your health care provider about whether you are at high risk of being infected   with HIV. If you choose to begin PrEP, you should first be tested for HIV. You should then be tested every 3 months for as long as you are taking PrEP. Pregnancy  If you are premenopausal and you may become pregnant, ask your health care provider about preconception counseling.  If you may become pregnant, take 400 to 800 micrograms (mcg) of folic acid every day.  If you want to prevent pregnancy, talk to your health care provider about birth control (contraception). Osteoporosis and menopause  Osteoporosis is a disease in which the bones lose minerals and strength with aging. This can result in serious bone fractures. Your risk for osteoporosis can be identified using a bone density scan.  If you are 68 years of age or older, or if you are at risk for osteoporosis and fractures, ask your health care provider if you should be screened.  Ask your health care provider whether you should take a calcium or vitamin D supplement to lower your risk for osteoporosis.  Menopause may have certain physical symptoms and risks.  Hormone replacement therapy may reduce some of these symptoms and risks. Talk to your health care provider about whether hormone replacement therapy is right for you. Follow these instructions at home:  Schedule regular health, dental, and eye exams.  Stay current with your immunizations.  Do not use any tobacco products including cigarettes, chewing tobacco, or electronic cigarettes.  If you are pregnant, do not drink alcohol.  If you are  breastfeeding, limit how much and how often you drink alcohol.  Limit alcohol intake to no more than 1 drink per day for nonpregnant women. One drink equals 12 ounces of beer, 5 ounces of wine, or 1 ounces of hard liquor.  Do not use street drugs.  Do not share needles.  Ask your health care provider for help if you need support or information about quitting drugs.  Tell your health care provider if you often feel depressed.  Tell your health care provider if you have ever been abused or do not feel safe at home. This information is not intended to replace advice given to you by your health care provider. Make sure you discuss any questions you have with your health care provider. Document Released: 07/28/2010 Document Revised: 06/20/2015 Document Reviewed: 10/16/2014 Elsevier Interactive Patient Education  [employer].

## 2016-06-30 NOTE — Assessment & Plan Note (Signed)
Chronic, improving with mediterranean diet and off meds.  The 10-year ASCVD risk score Mikey Bussing DC Brooke Bonito., et al., 2013) is: 9.1%   Values used to calculate the score:     Age: 71 years     Sex: Female     Is Non-Hispanic African American: No     Diabetic: No     Tobacco smoker: No     Systolic Blood Pressure: 616 mmHg     Is BP treated: No     HDL Cholesterol: 41.4 mg/dL     Total Cholesterol: 216 mg/dL

## 2016-06-30 NOTE — Assessment & Plan Note (Signed)
Advanced directives: she has this at home. Would want new husband to be HCPOA Lori Shaw). Asked to bring [name] copy.

## 2016-07-01 ENCOUNTER — Other Ambulatory Visit: Payer: Medicare HMO

## 2016-07-01 DIAGNOSIS — E785 Hyperlipidemia, unspecified: Secondary | ICD-10-CM | POA: Diagnosis not present

## 2016-07-01 LAB — MONONUCLEOSIS SCREEN: Mono Screen: NEGATIVE

## 2016-07-01 LAB — PATHOLOGIST SMEAR REVIEW

## 2016-07-02 LAB — CBC WITH DIFFERENTIAL/PLATELET
BASOS PCT: 1 %
Basophils Absolute: 153 cells/uL (ref 0–200)
EOS ABS: 153 {cells}/uL (ref 15–500)
EOS PCT: 1 %
HCT: 47 % — ABNORMAL HIGH (ref 35.0–45.0)
HEMOGLOBIN: 15.9 g/dL — AB (ref 11.7–15.5)
LYMPHS ABS: 9486 {cells}/uL — AB (ref 850–3900)
Lymphocytes Relative: 62 %
MCH: 30.9 pg (ref 27.0–33.0)
MCHC: 33.8 g/dL (ref 32.0–36.0)
MCV: 91.3 fL (ref 80.0–100.0)
MONOS PCT: 5 %
MPV: 10.8 fL (ref 7.5–12.5)
Monocytes Absolute: 765 cells/uL (ref 200–950)
NEUTROS ABS: 4743 {cells}/uL (ref 1500–7800)
Neutrophils Relative %: 31 %
PLATELETS: 229 10*3/uL (ref 140–400)
RBC: 5.15 MIL/uL — ABNORMAL HIGH (ref 3.80–5.10)
RDW: 14.4 % (ref 11.0–15.0)
WBC: 15.3 10*3/uL — ABNORMAL HIGH (ref 3.8–10.8)

## 2016-07-02 LAB — PATHOLOGIST SMEAR REVIEW

## 2016-07-03 ENCOUNTER — Telehealth: Payer: Self-pay | Admitting: Family Medicine

## 2016-07-03 ENCOUNTER — Other Ambulatory Visit: Payer: Self-pay | Admitting: Family Medicine

## 2016-07-03 ENCOUNTER — Other Ambulatory Visit (INDEPENDENT_AMBULATORY_CARE_PROVIDER_SITE_OTHER): Payer: Medicare HMO

## 2016-07-03 ENCOUNTER — Other Ambulatory Visit: Payer: Medicare HMO

## 2016-07-03 DIAGNOSIS — E785 Hyperlipidemia, unspecified: Secondary | ICD-10-CM

## 2016-07-03 DIAGNOSIS — D72820 Lymphocytosis (symptomatic): Secondary | ICD-10-CM | POA: Diagnosis not present

## 2016-07-03 DIAGNOSIS — D729 Disorder of white blood cells, unspecified: Secondary | ICD-10-CM

## 2016-07-03 LAB — CBC WITH DIFFERENTIAL/PLATELET
BASOS ABS: 0.1 10*3/uL (ref 0.0–0.1)
Basophils Relative: 0.6 % (ref 0.0–3.0)
Eosinophils Absolute: 0.3 10*3/uL (ref 0.0–0.7)
Eosinophils Relative: 2.1 % (ref 0.0–5.0)
HEMATOCRIT: 44 % (ref 36.0–46.0)
Hemoglobin: 14.7 g/dL (ref 12.0–15.0)
Lymphs Abs: 8.3 10*3/uL — ABNORMAL HIGH (ref 0.7–4.0)
MCHC: 33.4 g/dL (ref 30.0–36.0)
MCV: 91.6 fl (ref 78.0–100.0)
MONOS PCT: 4.9 % (ref 3.0–12.0)
Monocytes Absolute: 0.6 10*3/uL (ref 0.1–1.0)
Neutro Abs: 3.8 10*3/uL (ref 1.4–7.7)
Neutrophils Relative %: 28.9 % — ABNORMAL LOW (ref 43.0–77.0)
Platelets: 207 10*3/uL (ref 150.0–400.0)
RBC: 4.8 Mil/uL (ref 3.87–5.11)
RDW: 13.7 % (ref 11.5–15.5)
WBC: 13 10*3/uL — AB (ref 4.0–10.5)

## 2016-07-03 NOTE — Addendum Note (Signed)
Addended bySelinda Orion on: 07/03/2016 09:52 AM   Modules accepted: Orders

## 2016-07-03 NOTE — Telephone Encounter (Signed)
Pt returned call regarding labs. Please call back

## 2016-07-06 ENCOUNTER — Other Ambulatory Visit: Payer: Medicare HMO

## 2016-07-06 LAB — PATHOLOGIST SMEAR REVIEW

## 2016-07-09 LAB — COMP PANEL: LEUKEMIA/LYMPHOMA

## 2016-07-10 NOTE — Telephone Encounter (Signed)
Pt called stating she sees her lab results on my chart but has questions   Best number to call [phone]

## 2016-07-10 NOTE — Telephone Encounter (Signed)
I don't see where anyone initially contacted pt re: lab results. Flow cytometry results are now in, please advise instructions for pt.

## 2016-07-10 NOTE — Telephone Encounter (Signed)
spoke with patient Abnormal flow cytometry but not conclusive CLL/SLL - recommend heme/onc referral and placed today.  She also asks about b12 soln she received at pharmacy - I recommended she take b12 1047mcg oral daily, bring in vial for next b12 shot in office 07/30/2016.

## 2016-07-14 NOTE — Telephone Encounter (Signed)
Patient will be seen this Friday,07/17/16 by Dr [facility].

## 2016-07-17 ENCOUNTER — Encounter: Payer: Self-pay | Admitting: Oncology

## 2016-07-17 ENCOUNTER — Inpatient Hospital Stay: Payer: Medicare HMO | Attending: Oncology | Admitting: Oncology

## 2016-07-17 VITALS — BP 132/72 | HR 61 | Temp 97.9°F | Resp 20 | Ht 64.25 in | Wt 207.0 lb

## 2016-07-17 DIAGNOSIS — E785 Hyperlipidemia, unspecified: Secondary | ICD-10-CM | POA: Diagnosis not present

## 2016-07-17 DIAGNOSIS — E559 Vitamin D deficiency, unspecified: Secondary | ICD-10-CM | POA: Diagnosis not present

## 2016-07-17 DIAGNOSIS — M8580 Other specified disorders of bone density and structure, unspecified site: Secondary | ICD-10-CM | POA: Diagnosis not present

## 2016-07-17 DIAGNOSIS — E669 Obesity, unspecified: Secondary | ICD-10-CM | POA: Insufficient documentation

## 2016-07-17 DIAGNOSIS — G4710 Hypersomnia, unspecified: Secondary | ICD-10-CM | POA: Insufficient documentation

## 2016-07-17 DIAGNOSIS — Z79899 Other long term (current) drug therapy: Secondary | ICD-10-CM | POA: Insufficient documentation

## 2016-07-17 DIAGNOSIS — C9110 Chronic lymphocytic leukemia of B-cell type not having achieved remission: Secondary | ICD-10-CM | POA: Insufficient documentation

## 2016-07-17 DIAGNOSIS — J302 Other seasonal allergic rhinitis: Secondary | ICD-10-CM | POA: Diagnosis not present

## 2016-07-17 DIAGNOSIS — Z6830 Body mass index (BMI) 30.0-30.9, adult: Secondary | ICD-10-CM | POA: Insufficient documentation

## 2016-07-17 DIAGNOSIS — G5700 Lesion of sciatic nerve, unspecified lower limb: Secondary | ICD-10-CM | POA: Insufficient documentation

## 2016-07-17 DIAGNOSIS — E538 Deficiency of other specified B group vitamins: Secondary | ICD-10-CM | POA: Insufficient documentation

## 2016-07-17 DIAGNOSIS — G4733 Obstructive sleep apnea (adult) (pediatric): Secondary | ICD-10-CM | POA: Diagnosis not present

## 2016-07-17 DIAGNOSIS — Z87891 Personal history of nicotine dependence: Secondary | ICD-10-CM | POA: Insufficient documentation

## 2016-07-17 NOTE — Progress Notes (Signed)
Patient here for new consult with Dr. Janese Banks for h/o leukocytosis - x 3 weeks.  Patient denies fevers, night sweats. Patient reports generalized fatigue. Patient has not had any illness in the last several months. Reports normal bowel mvmts daily. Pt denies any nausea/vomtiing.

## 2016-07-17 NOTE — Progress Notes (Signed)
Hematology/Oncology Consult note [facility] [phone] [phone]  Patient Care Team: Ria Bush, MD as PCP - General (Family Medicine) Solon Augusta, MD as Attending Physician (Ophthalmology) Elbert Ewings, DDS as Consulting Physician (Dentistry)   Name of the patient: [place]  [id number]  [birth year]    Reason for referral- clonal lymphocytosis   Referring physician- Dr. Danise Mina  Date of visit: 07/17/16   History of presenting illness- patient is a 71 year old female with no significant past medical history other than low B12 and low vitamin D levels. Patient was recently noted to have leukocytosis with a white count of 13. Differential mainly showed lymphocytosis. Recent CBC from 07/03/2016 showed white count of 13, H&H of 14.7/44 and a platelet count of 207. Differential showed 63.5% lymphocytes. Parapharyngeal flow cytometry showed CD5 positive, CD23 positive clonal B-cell population CLL/SLL phenotype. She has been referred to [name] for the same. Overall patient is doing well and denies any symptoms of fevers, chills, fatigue, drenching night sweats or unintentional weight loss. Denies any lumps or bumps anywhere or recurrent infections.  ECOG PS- 0  Pain scale- 0   Review of systems- Review of Systems  Constitutional: Negative for chills, fever, malaise/fatigue and weight loss.  HENT: Negative for congestion, ear discharge and nosebleeds.   Eyes: Negative for blurred vision.  Respiratory: Negative for cough, hemoptysis, sputum production, shortness of breath and wheezing.   Cardiovascular: Negative for chest pain, palpitations, orthopnea and claudication.  Gastrointestinal: Negative for abdominal pain, blood in stool, constipation, diarrhea, heartburn, melena, nausea and vomiting.  Genitourinary: Negative for dysuria, flank pain, frequency, hematuria and urgency.  Musculoskeletal: Negative for back pain, joint  pain and myalgias.  Skin: Negative for rash.  Neurological: Negative for dizziness, tingling, focal weakness, seizures, weakness and headaches.  Endo/Heme/Allergies: Does not bruise/bleed easily.  Psychiatric/Behavioral: Negative for depression and suicidal ideas. The patient does not have insomnia.     Allergies  Allergen Reactions  . Sulfa Antibiotics     Patient Active Problem List   Diagnosis Date Noted  . Vitamin D deficiency 06/30/2016  . Lymphocytosis 06/21/2016  . B12 deficiency 06/21/2016  . Fatigue 06/18/2016  . ETD (eustachian tube dysfunction) 04/27/2016  . Rash and nonspecific skin eruption 08/23/2015  . Health maintenance examination 06/20/2015  . Advanced care planning/counseling discussion 06/20/2015  . Right shoulder pain 06/20/2015  . Leg wound, left 03/05/2015  . Piriformis syndrome 12/14/2014  . Hypersomnia, persistent 09/18/2014  . Obesity, Class I, BMI 30-34.9 09/18/2014  . Sleep related headaches 04/24/2014  . Medicare annual wellness visit, subsequent 05/04/2013  . Seasonal allergic rhinitis 01/17/2013  . HLD (hyperlipidemia)   . OSA on CPAP   . Osteopenia 08/27/2010     Past Medical History:  Diagnosis Date  . HLD (hyperlipidemia)   . OSA on CPAP 2006, 04/2014   AHI of 23.8 and an RDI of 42.9, on CPAP 9 cm H2O ([place])  . Osteopenia 08/2010   T -1.4 at L femur neck     Past Surgical History:  Procedure Laterality Date  . ABDOMINAL HYSTERECTOMY  01/27/1983   for menometrorrhagia, one ovary remained  . COLONOSCOPY  2012   no records available. per patient normal.   . dexa  08/2010   osteopenia  . TONSILLECTOMY  01/26/1973    Social History   Social History  . Marital status: Married    Spouse name: N/A  . Number of children: 2  . Years of education: 1.5  Occupational History  . retired    Social History Main Topics  . Smoking status: Former Smoker    Packs/day: 2.00    Years: 20.00    Types: Cigarettes    Quit date:  01/26/1978  . Smokeless tobacco: Never Used  . Alcohol use No  . Drug use: No  . Sexual activity: Yes   Other Topics Concern  . Not on file   Social History Narrative   Lives with daughter, 1 dog   Occupation: retired, has worked in Chief Strategy Officer, also Forensic psychologist   Edu: 1.5 yrs college   Activity: square dancing   Diet: good water, fruits/vegetables daily   Patient drinks two cups of caffeine daily.   Remarried.       Family History  Problem Relation Age of Onset  . Hyperlipidemia Mother   . CAD Mother        CABG  . Hyperlipidemia Father   . CAD Father        MI  . Arthritis Maternal Grandmother   . Cancer Sister        breast  . Breast cancer Sister 25  . Alzheimer's disease Unknown   . Heart attack Unknown   . Diabetes Neg Hx   . Stroke Neg Hx      Current Outpatient Prescriptions:  .  Cholecalciferol (VITAMIN D) 2000 units CAPS, Take 1 capsule by mouth daily., Disp: , Rfl:  .  fluticasone (FLONASE) 50 MCG/ACT nasal spray, Place 2 sprays into both nostrils daily as needed., Disp: 16 g, Rfl: 11 .  vitamin B-12 (CYANOCOBALAMIN) 1000 MCG tablet, Take 1,000 mcg by mouth daily., Disp: , Rfl:    Physical exam:  Vitals:   07/17/16 1415 07/17/16 1420  BP: 132/72   Pulse: 61   Resp: 20   Temp: 97.9 F (36.6 C)   TempSrc: Tympanic   Weight:  207 lb (93.9 kg)  Height:  5' 4.25" (1.632 m)   Physical Exam  Constitutional: She is oriented to person, place, and time and well-developed, well-nourished, and in no distress.  HENT:  Head: Normocephalic and atraumatic.  Eyes: EOM are normal. Pupils are equal, round, and reactive to light.  Neck: Normal range of motion.  Cardiovascular: Normal rate, regular rhythm and normal heart sounds.   Pulmonary/Chest: Effort normal and breath sounds normal.  Abdominal: Soft. Bowel sounds are normal.  No palpable splenomegaly  Lymphadenopathy:  No palpable cervical, supraclavicular, axillary or inguinal adenopathy    Neurological: She is alert and oriented to person, place, and time.  Skin: Skin is warm and dry.       CMP Latest Ref Rng & Units 06/19/2016  Glucose 70 - 99 mg/dL 98  BUN 6 - 23 mg/dL 14  Creatinine 0.40 - 1.20 mg/dL 0.85  Sodium 135 - 145 mEq/L 141  Potassium 3.5 - 5.1 mEq/L 4.3  Chloride 96 - 112 mEq/L 106  CO2 19 - 32 mEq/L 29  Calcium 8.4 - 10.5 mg/dL 9.4  Total Protein 6.0 - 8.3 g/dL 6.5  Total Bilirubin 0.2 - 1.2 mg/dL 0.6  Alkaline Phos 39 - 117 U/L 66  AST 0 - 37 U/L 18  ALT 0 - 35 U/L 24   CBC Latest Ref Rng & Units 07/03/2016  WBC 4.0 - 10.5 K/uL 13.0(H)  Hemoglobin 12.0 - 15.0 g/dL 14.7  Hematocrit 36.0 - 46.0 % 44.0  Platelets 150.0 - 400.0 K/uL 207.0     Assessment and plan- Patient is a  71 y.o. female with new diagnosis of Rai stage 0 CLL  I discussed the results of the blood work with the patient which is consistent with right stage 0 CLL given that she did not have any associated anemia, thrombocytopenia, palpable splenomegaly or palpable lymphadenopathy. At this time her CLL can be monitored conservatively without any intervention. I discussed with her the natural history of CLL and indications for treatment. Treatment would be typically indicated for these symptoms, bulky painful adenopathy, rapidly doubling absolute lymphocyte count or development of cytopenias such as hemoglobin less than 10 or if count less than 100. Currently patient does not have any of these criteria and hence does not require treatment.  I will repeat her CBC in 3 months time and see him back in 6 months   Thank you for this kind referral and the opportunity to participate in the care of this patient   Visit Diagnosis 1. CLL (chronic lymphocytic leukemia) ([place])     Dr. Randa Evens, MD, MPH [facility] Pager- [phone] 07/17/2016

## 2016-07-19 ENCOUNTER — Encounter: Payer: Self-pay | Admitting: Family Medicine

## 2016-07-30 ENCOUNTER — Ambulatory Visit (INDEPENDENT_AMBULATORY_CARE_PROVIDER_SITE_OTHER): Payer: Medicare HMO

## 2016-07-30 DIAGNOSIS — E538 Deficiency of other specified B group vitamins: Secondary | ICD-10-CM | POA: Diagnosis not present

## 2016-07-30 MED ORDER — CYANOCOBALAMIN 1000 MCG/ML IJ SOLN
1000.0000 ug | Freq: Once | INTRAMUSCULAR | Status: AC
  Administered 2016-07-30: 1000 ug via INTRAMUSCULAR

## 2016-08-05 DIAGNOSIS — G4733 Obstructive sleep apnea (adult) (pediatric): Secondary | ICD-10-CM | POA: Diagnosis not present

## 2016-08-11 ENCOUNTER — Encounter: Payer: Self-pay | Admitting: Family Medicine

## 2016-08-11 ENCOUNTER — Ambulatory Visit (INDEPENDENT_AMBULATORY_CARE_PROVIDER_SITE_OTHER): Payer: Medicare HMO | Admitting: Family Medicine

## 2016-08-11 VITALS — BP 145/78 | HR 59 | Temp 97.6°F | Ht 64.25 in | Wt 210.5 lb

## 2016-08-11 DIAGNOSIS — Z7189 Other specified counseling: Secondary | ICD-10-CM | POA: Diagnosis not present

## 2016-08-11 DIAGNOSIS — R5383 Other fatigue: Secondary | ICD-10-CM

## 2016-08-11 DIAGNOSIS — C9110 Chronic lymphocytic leukemia of B-cell type not having achieved remission: Secondary | ICD-10-CM

## 2016-08-11 DIAGNOSIS — E538 Deficiency of other specified B group vitamins: Secondary | ICD-10-CM

## 2016-08-11 DIAGNOSIS — C9190 Lymphoid leukemia, unspecified not having achieved remission: Secondary | ICD-10-CM | POA: Diagnosis not present

## 2016-08-11 MED ORDER — CYANOCOBALAMIN 1000 MCG/ML IJ SOLN
1000.0000 ug | Freq: Once | INTRAMUSCULAR | Status: AC
  Administered 2016-08-11: 1000 ug via INTRAMUSCULAR

## 2016-08-11 NOTE — Assessment & Plan Note (Signed)
New dx. Stage 0. Likely not related to fatigue. Planned monitoring with onc.

## 2016-08-11 NOTE — Patient Instructions (Addendum)
B12 shot today then weekly for 4 weeks then return to monthly.  Continue b12 oral if it seems to be helpful.  Let us know if worsening numbness/tingling or bone/joint pains.  Keep track of blood pressure at home a few times a week - and let me know if consistently >140/90.

## 2016-08-11 NOTE — Assessment & Plan Note (Signed)
Longstanding. She has noticed improvement after b12 shot but quick return after a few days. Given significant deficiency noted, reasonable to increase frequency of injections to weekly x 4 weeks then return to monthly. I asked pt to notify via mychart with effect.

## 2016-08-11 NOTE — Assessment & Plan Note (Signed)
Brings advanced directive and scanned 07/2016 - HCPOA is husband Watson Schlemmer then Glenna Sixkiller. Does not want prolonged life support if incurable condition.  

## 2016-08-11 NOTE — Assessment & Plan Note (Signed)
Increase frequency of b12 shots to weekly x 4 wks then return to monthly. Pt also continues daily oral 1091mcg replacement.

## 2016-08-11 NOTE — Progress Notes (Signed)
BP (!) 145/78   Pulse (!) 59   Temp 97.6 F (36.4 C) (Oral)   Ht 5' 4.25" (1.632 m)   Wt 210 lb 8 oz (95.5 kg)   BMI 35.85 kg/m   BP on recheck 172/90  CC: fatigue Subjective:    Patient ID: Lori Shaw, female    DOB: [birth year], 71 y.o.   MRN: [id number]  HPI: Lori Shaw is a 71 y.o. female presenting on 08/11/2016 for Fatigue (Had B12 injeciton on 07/30/16.  States she had 4 good days and now is fatigued again)   Recent dx stage 0 CLL seen by Dr Janese Banks oncology - planned close monitoring.   No lymphadenopathy, weight loss or night sweats.   She does note several month h/o R lateral thigh paresthesias and some R knee, ankle, foot pain. No back pain, weakness.   Recently found to have vit B12 deficiency, started replacement 06/30/2016. Had 2nd shot 07/30/2016. After first shot she did note improvement in energy levels for 2 days. After second shot she did note improvement in energy levels for 4 days.   Lab Results  Component Value Date   VITAMINB12 164 (L) 06/19/2016    Relevant past medical, surgical, family and social history reviewed and updated as indicated. Interim medical history since our last visit reviewed. Allergies and medications reviewed and updated. Outpatient Medications Prior to Visit  Medication Sig Dispense Refill  . Cholecalciferol (VITAMIN D) 2000 units CAPS Take 1 capsule by mouth daily.    . fluticasone (FLONASE) 50 MCG/ACT nasal spray Place 2 sprays into both nostrils daily as needed. 16 g 11  . vitamin B-12 (CYANOCOBALAMIN) 1000 MCG tablet Take 1,000 mcg by mouth daily.     No facility-administered medications prior to visit.      Per HPI unless specifically indicated in ROS section below Review of Systems     Objective:    BP (!) 145/78   Pulse (!) 59   Temp 97.6 F (36.4 C) (Oral)   Ht 5' 4.25" (1.632 m)   Wt 210 lb 8 oz (95.5 kg)   BMI 35.85 kg/m   Wt Readings from Last 3 Encounters:  08/11/16 210 lb 8 oz (95.5 kg)   07/17/16 207 lb (93.9 kg)  06/30/16 206 lb 4 oz (93.6 kg)    BP Readings from Last 3 Encounters:  08/11/16 (!) 145/78  07/17/16 132/72  06/30/16 126/72    Physical Exam  Constitutional: She appears well-developed and well-nourished. No distress.  Skin: Skin is warm and dry. No rash noted.  Psychiatric: She has a normal mood and affect.  Tearful with discussion of recent stressors including recent diagnosis of CLL  Nursing note and vitals reviewed.      Assessment & Plan:  She brings recent life line screening results which wre reviewed with patient and sent for scanning.   Problem List Items Addressed This Visit    Advanced care planning/counseling discussion    Brings advanced directive and scanned 07/2016 - HCPOA is husband Energy manager then [employer]. Does not want prolonged life support if incurable condition.       CLL (chronic lymphocytic leukemia) ([place])    New dx. Stage 0. Likely not related to fatigue. Planned monitoring with onc.       Fatigue - Primary    Longstanding. She has noticed improvement after b12 shot but quick return after a few days. Given significant deficiency noted, reasonable to increase frequency of injections to weekly  x 4 weeks then return to monthly. I asked pt to notify via mychart with effect.       Vitamin B12 deficiency    Increase frequency of b12 shots to weekly x 4 wks then return to monthly. Pt also continues daily oral 1067mcg replacement.       Relevant Medications   cyanocobalamin ((VITAMIN B-12)) injection 1,000 mcg (Completed)       Follow up plan: Return if symptoms worsen or fail to improve.  Ria Bush, MD

## 2016-08-14 ENCOUNTER — Encounter: Payer: Self-pay | Admitting: Family Medicine

## 2016-08-18 ENCOUNTER — Telehealth: Payer: Self-pay | Admitting: *Deleted

## 2016-08-18 ENCOUNTER — Ambulatory Visit (INDEPENDENT_AMBULATORY_CARE_PROVIDER_SITE_OTHER): Payer: Medicare HMO | Admitting: *Deleted

## 2016-08-18 DIAGNOSIS — E538 Deficiency of other specified B group vitamins: Secondary | ICD-10-CM

## 2016-08-18 MED ORDER — CYANOCOBALAMIN 1000 MCG/ML IJ SOLN
1000.0000 ug | Freq: Once | INTRAMUSCULAR | Status: AC
  Administered 2016-08-18: 1000 ug via INTRAMUSCULAR

## 2016-08-18 NOTE — Telephone Encounter (Signed)
Pt came in b12 inj today. FYI, she states the pain she was having in her leg went away after her last b12 inj, but it restarted yesterday. She wanted you to know since you mentioned it could be related to her b12 deficiency.   Pt does not expect a call back.

## 2016-08-18 NOTE — Telephone Encounter (Signed)
Noted  

## 2016-08-25 ENCOUNTER — Ambulatory Visit (INDEPENDENT_AMBULATORY_CARE_PROVIDER_SITE_OTHER): Payer: Medicare HMO | Admitting: *Deleted

## 2016-08-25 DIAGNOSIS — E538 Deficiency of other specified B group vitamins: Secondary | ICD-10-CM

## 2016-08-25 MED ORDER — CYANOCOBALAMIN 1000 MCG/ML IJ SOLN
1000.0000 ug | Freq: Once | INTRAMUSCULAR | Status: AC
  Administered 2016-08-25: 1000 ug via INTRAMUSCULAR

## 2016-09-01 ENCOUNTER — Ambulatory Visit: Payer: Medicare HMO

## 2016-09-02 ENCOUNTER — Ambulatory Visit (INDEPENDENT_AMBULATORY_CARE_PROVIDER_SITE_OTHER): Payer: Medicare HMO | Admitting: *Deleted

## 2016-09-02 DIAGNOSIS — E538 Deficiency of other specified B group vitamins: Secondary | ICD-10-CM

## 2016-09-03 ENCOUNTER — Encounter: Payer: Self-pay | Admitting: Family Medicine

## 2016-09-03 DIAGNOSIS — R69 Illness, unspecified: Secondary | ICD-10-CM | POA: Diagnosis not present

## 2016-09-03 MED ORDER — CYANOCOBALAMIN 1000 MCG/ML IJ SOLN
1000.0000 ug | Freq: Once | INTRAMUSCULAR | Status: AC
  Administered 2016-09-02: 1000 ug via INTRAMUSCULAR

## 2016-09-03 NOTE — Telephone Encounter (Signed)
See pt email re- B12 shot scheduling. Recommend weekly through august, then in September start Q2 wks for 2 months then November try monthly.

## 2016-09-09 ENCOUNTER — Ambulatory Visit (INDEPENDENT_AMBULATORY_CARE_PROVIDER_SITE_OTHER): Payer: Medicare HMO

## 2016-09-09 DIAGNOSIS — E538 Deficiency of other specified B group vitamins: Secondary | ICD-10-CM | POA: Diagnosis not present

## 2016-09-09 MED ORDER — CYANOCOBALAMIN 1000 MCG/ML IJ SOLN
1000.0000 ug | Freq: Once | INTRAMUSCULAR | Status: AC
  Administered 2016-09-09: 1000 ug via INTRAMUSCULAR

## 2016-09-16 ENCOUNTER — Ambulatory Visit (INDEPENDENT_AMBULATORY_CARE_PROVIDER_SITE_OTHER): Payer: Medicare HMO

## 2016-09-16 DIAGNOSIS — E538 Deficiency of other specified B group vitamins: Secondary | ICD-10-CM | POA: Diagnosis not present

## 2016-09-16 MED ORDER — CYANOCOBALAMIN 1000 MCG/ML IJ SOLN
1000.0000 ug | Freq: Once | INTRAMUSCULAR | Status: AC
  Administered 2016-09-16: 1000 ug via INTRAMUSCULAR

## 2016-09-17 ENCOUNTER — Encounter: Payer: Self-pay | Admitting: Neurology

## 2016-09-17 ENCOUNTER — Ambulatory Visit (INDEPENDENT_AMBULATORY_CARE_PROVIDER_SITE_OTHER): Payer: Medicare HMO | Admitting: Neurology

## 2016-09-17 VITALS — BP 108/65 | HR 73 | Ht 65.0 in | Wt 209.0 lb

## 2016-09-17 DIAGNOSIS — Z9989 Dependence on other enabling machines and devices: Secondary | ICD-10-CM | POA: Diagnosis not present

## 2016-09-17 DIAGNOSIS — G4733 Obstructive sleep apnea (adult) (pediatric): Secondary | ICD-10-CM

## 2016-09-17 NOTE — Progress Notes (Signed)
[facility]   Provider:  Larey Seat, M D  Referring Provider: Ria Bush, MD Primary Care Physician:  Ria Bush, MD  Chief Complaint  Patient presents with  . Follow-up     pt with husband, pt has been diagnosed with CLL in June. not receiving any treatment. CPAP is working well and pt is using nightly. pt uses [facility].     HPI:  Lori Shaw is a 71 y.o. female ,seen here as a revisit for CPAP follow up.   Chief complaint according to patient : none - just here for routine check up .   I see Lori Shaw today in the presence of her husband, and she just revealed that she has been diagnosed with CLL, chronic lymphatic leukemia. Her diagnosis was established in June 2018. She is doing well. She feels more fatigued and tired.  As to her CPAP, I do  have a download available today. Her CPAP download was still generated under her maiden name, Lori Shaw. The patient has used the machine 100% of the last 30 days with an average of 97% for over 4 hours of daily use, average user time 7 hours 53 minutes, the CPAP is set at 8 cm water, humidifier set at 5, ramp time 20 minutes this download does not generate a residual AHI or other therapeutic data, these were strictly compliance data. The patient underwent a split-night polysomnography in April 2016 after she was already established on CPAP, her AHI was 23.8 without significant oxygen desaturations. She has been a compliant user over the last 2 years.  She has advanced her bed time to 9.30 Pm. He turns the TV on in the bedroom.    Lori Shaw underwent a split-night polysomnography on 05-22-14, after she is a current CPAP user had presented again with excessive daytime sleepiness and fatigue breakthrough snoring and nocturia. The machine appear to be malfunctioning turning off in the middle of the night and she needed to requalify for CPAP machine. The baseline part of her split-night polysomnography revealed an AHI  of 23.8 and an RDI of 42.9. Supine AHI was 32.3 she did not sleep in REM for the first diagnostic part of the study. She had no significant oxygen desaturations. This was important as the patient had also presented this morning headaches. The titration begun at 5 cm water CPAP and advanced to 9 cm but she seemed to have done very well with out EPR. Her AHI became 0.8 and she was using a dream wear mask with a ResMED machine.  As newlyweds she and her husband went to a trip to [place] but unfortunately she forgot the power cord , logging the CPAP overseas. I will review her compliance data up to August 3 and it is clear that the patient is highly compliant and use the machine 93% of the time for the first 30 days of use. Average user time changed due to the icelandic break / interruption. The machine is set at 9 cm water pressure with 2 cm EPR, the  residual AHI is 1.8. This is a very good resolution of her sleep apnea and she has noted no nocturia no morning headaches and is more alert and at night sound asleep. She still endorsed 17 points on the Epworth Sleepiness Scale . The patient still is excessively daytime sleepy. She states that she falls asleep very promptly once she puts her CPAP on and that her husband, who likes watching TV in the bedroom does  not keep her  awake with his habits. She goes to bed between 9 and 9:30 PM and sleeps usually through to 6 AM. She wakes more restored and refreshed now again without headaches and the dry mouth is no longer an issue.  I would like for Lori Shaw to undergo an HLA narcolepsy test. Her sleepiness is out of proportion to the residual apnea as she presents with, looking for another reason for her sleepiness besides obstructive sleep apnea. She can take up 10 minute power nap and feels great again she reports. At nighttime she does not recall any dreams no nightmares and no vivid dreams especially. She does report occasional waking up being unable to open her  eyes being paralyzed.She has never experienced cataplexy due to anger, startling or loud laughing.  Interval history on 09/17/2015 for Lori Shaw, Lori Shaw HL a narcolepsy panel returned negative, her Epworth sleepiness score however has been reduced to 6 points, fatigue severity 32 points and the geriatric depression score was clinically insignificant at 1 out of 15 points. She has been very compliant with her CPAP use 100% of the last 30 days with an average user time of 8 hours and 26 minutes, the machine is set at 9 cm water with 2 cm EPR, the residual AHI is 1.1. There have been no major air leaks. I conclude that the CPAP therapy does reduce her daytime sleepiness significantly. The couple has been very active this summer and traveled through [place] and [place]".  Sleep medical history and family sleep history:  Her brothers have CPAP machine, sister that snores, but was not tested, both parents had witnessed apnea, untreated - both died of heart disease Social history: remarried. Her husband has MS.  2 beverages with caffeine a day  , rare alcohol drinker, former smoker.   Review of Systems: Out of a complete 14 system review, the patient complains of only the following symptoms, and all other reviewed systems are negative. See above   Epworth score  6 from 17.  FSS 47 -   Keep on CPAP. HLA negative. B12 supplemented.    Social History   Social History  . Marital status: Married    Spouse name: N/A  . Number of children: 2  . Years of education: 1.5   Occupational History  . retired    Social History Main Topics  . Smoking status: Former Smoker    Packs/day: 2.00    Years: 20.00    Types: Cigarettes    Quit date: 01/26/1978  . Smokeless tobacco: Never Used  . Alcohol use No  . Drug use: No  . Sexual activity: Yes   Other Topics Concern  . Not on file   Social History Narrative   Lives with daughter, 1 dog   Occupation: retired, has worked in Statistician, also Forensic psychologist   Edu: 1.5 yrs college   Activity: square dancing   Diet: good water, fruits/vegetables daily   Patient drinks two cups of caffeine daily.   Remarried.      Family History  Problem Relation Age of Onset  . Hyperlipidemia Mother   . CAD Mother        CABG  . Hyperlipidemia Father   . CAD Father        MI  . Arthritis Maternal Grandmother   . Cancer Sister        breast  . Breast cancer Sister 70  . Alzheimer's disease Unknown   .  Heart attack Unknown   . Diabetes Neg Hx   . Stroke Neg Hx     Past Medical History:  Diagnosis Date  . CLL (chronic lymphocytic leukemia) ([place]) 06/21/2016   Rai stage 0, dx 06/2016 (Dr Lori Shaw)  . HLD (hyperlipidemia)   . OSA on CPAP 2006, 04/2014   AHI of 23.8 and an RDI of 42.9, on CPAP 9 cm H2O (Lori Shaw)  . Osteopenia 08/2010   T -1.4 at L femur neck    Past Surgical History:  Procedure Laterality Date  . ABDOMINAL HYSTERECTOMY  01/27/1983   for menometrorrhagia, one ovary remained  . COLONOSCOPY  2012   no records available. per patient normal.   . dexa  08/2010   osteopenia  . TONSILLECTOMY  01/26/1973    Current Outpatient Prescriptions  Medication Sig Dispense Refill  . Cholecalciferol (VITAMIN D) 2000 units CAPS Take 1 capsule by mouth daily.    . fluticasone (FLONASE) 50 MCG/ACT nasal spray Place 2 sprays into both nostrils daily as needed. 16 g 11  . vitamin B-12 (CYANOCOBALAMIN) 1000 MCG tablet Take 1,000 mcg by mouth daily.     No current facility-administered medications for this visit.     Allergies as of 09/17/2016 - Review Complete 08/11/2016  Allergen Reaction Noted  . Sulfa antibiotics  01/17/2013    Vitals: BP 108/65   Pulse 73   Ht _0  (1.651 m)   Wt 209 lb (94.8 kg)   BMI 34.78 kg/m  Last Weight:  Wt Readings from Last 1 Encounters:  09/17/16 209 lb (94.8 kg)   DUK:GURK mass index is 34.78 kg/m.     Last Height:   Ht Readings from Last 1 Encounters:  09/17/16 _1   (1.651 m)    Physical exam:  General: The patient is awake, alert.The patient is well groomed. Head: Normocephalic, atraumatic. Neck is supple. Mallampati 3   neck circumference:15.5 Nasal airflow restricted , TMJ is not  evident . Retrognathia is not seen.  Cardiovascular:  Regular rate and rhythm , Respiratory: Lungs are clear to auscultation.Skin:  Without evidence of edema, or rashTrunk: BMI is 33. The patient's posture is erect    Neurologic exam :The patient is awake and alert, oriented to place and time.   Attention span & concentration ability appears normal. Speech is fluent, withdysarthria, dysphonia -not  aphasia.  Mood and affect are appropriate.  Cranial nerves: intact taste and smell. Pupils are equal and briskly reactive to light.  Extraocular movements  in vertical and horizontal planes intact and without nystagmus. Visual fields by finger perimetry are intact.Facial sensation intact to fine touch. Facial motor strength is symmetric and tongue and uvula move midline. Shoulder shrug was symmetrical.   Deep tendon reflexes: in the  upper and lower extremities are symmetric and intact. Babinski maneuver response is  downgoing.  The patient was advised of the nature of the diagnosed sleep disorder , the treatment options and risks for general a health and wellness arising from not treating the condition.  I spent more than 20 minutes of face to face time with the patient. Greater than 50% of time was spent in counseling and coordination of care. We have discussed the diagnosis and differential and I answered the patient's questions.     Assessment:  After physical and neurologic examination, review of laboratory studies,  Personal review of imaging studies, reports of other /same  Imaging studies ,  Results of polysomnography/ neurophysiology testing and pre-existing records as far as  provided in visit., my assessment is   1) obstructive sleep apnea confirmed in a polysomnography  by split night protocol on 05-22-14.  The patient had an AHI of 23.8 and an RDI of 42.9. This verifies her history of thunderous snoring.  She had many more snoring related events than actual true apneas.  CPAP was titrated to 9 cm water pressure and alleviated the AHI. She now has responded again with an Epworth of 6 ! Uses CPAP at 8 CM    2)obesity is main risk factor. Patient well aware but frustrated with diet results. Offered medical diet. Rv in 12 month    3) CLL diagnosed, may explain her fatigue . FSS 46-47    Asencion Partridge Lori Myrie MD  09/17/2016   CC: Ria Bush, [place], [place] [id number]

## 2016-09-17 NOTE — Patient Instructions (Signed)
Chronic Lymphocytic Leukemia Chronic lymphocytic leukemia (CLL) is a type of cancer of the blood cells and soft tissue inside bones (bone marrow). CLL happens when your bone marrow makes too many abnormal white blood cells. The cells, called leukemia cells, do not function normally and accumulate in the blood. Eventually they crowd out other healthy blood cells. CLL usually gets worse slowly. It can cause complications in your organs, such as in your spleen. It can also weaken your immune system and lead to conditions in which your immune system attacks your body (autoimmune conditions). What are the causes? The cause of this condition is not known. What increases the risk? You are more likely to develop this condition if:  You are older than 50 years.  You are white.  You are female.  You have a family history of CLL or other cancers of the lymph system.  You are of [place] or [place] descent.  You have been exposed to certain chemicals, such as: ? Insecticides. ? Herbicides. These include Agent Orange, a herbicide used in the [place] war.  What are the signs or symptoms? At first, there may be no symptoms. After a while, symptoms may include:  Feeling more tired than usual, even after rest.  Unplanned weight loss.  Heavy sweating at night.  Fever.  Shortness of breath.  Paleness.  Painless, swollen lymph nodes.  A feeling of fullness in the upper left part of the abdomen.  Easy bruising or bleeding.  Frequent infections.  How is this diagnosed? This condition is diagnosed based on:  A physical exam to check for an enlarged spleen, liver, or lymph nodes.  Blood and bone marrow tests to check for leukemia cells. Tests may include: ? A complete blood count. ? Flow cytometry. This method uses light sensors and dyes to figure out the number of cells as well as their size, structure, and general health. ? Immunophenotyping. This method is used to  diagnose leukemia by identifying specific antibodies found in white blood cells. The test is used when a complete blood count shows the presence of immature cells or a high number of white blood cells. ? Fluorescence in situ hybridization (FISH). This test is used to examine defects in chromosomes and how those defects affect the functioning of the cell. Results from a [place] test will be used to determine treatment and assess the outcome of that treatment.  A CT scan to check for swelling or anything abnormal in your spleen, liver, and lymph nodes.  How is this treated? Treatment for this condition depends on the stage of the leukemia and whether you have symptoms. Treatment may include:  Observation.  Targeted drugs. These are medicines that interfere with the way leukemia cells grow and multiply. They identify and attack specific leukemia cells without harming normal cells.  Chemotherapy drugs. These are medicines that kill leukemia cells that are multiplying quickly.  Radiation.  Surgery to remove the spleen.  Biological therapy (immunotherapy). This treatment boosts the ability of your immune system to fight the leukemia cells.  Bone marrow or peripheral blood stem cell transplant. This treatment replaces your own bone marrow or stem cells with bone marrow or stem cells from a donor. This treatment may be done after you receive very high doses of chemotherapy or radiation that kill your stem cells and bone marrow.  New treatments through clinical trials.  Additional medicines may be needed to help manage symptoms. Follow these instructions at home: Medicines  Take  over-the-counter and prescription medicines only as told by your health care provider.  If you were prescribed an antibiotic medicine, take it as told by your health care provider. Do not stop taking the antibiotic even if you start to feel better. If you are on chemotherapy:  Wash your hands often, especially before  meals, after being outside, and after using the toilet. Have visitors do the same.  Keep your teeth and gums clean and well cared for. Use soft toothbrushes.  Protect your skin from the sun by using sunscreen and wearing protective clothing. General instructions  Avoid contact sports or other rough activities. Ask your health care provider what activities are safe for you.  Avoid crowded places and people who are sick.  Tell your cancer care team if you develop side effects. They may be able to recommend ways to relieve them.  Try to eat regular, healthy meals. Some of your treatments might affect your appetite.  Find healthy ways of coping with stress, such as by doing yoga or meditation or by joining a support group.  Keep all follow-up visits as told by your health care provider. This is important. Where to find more information:  American Cancer Society: www.cancer.org  Leukemia and Lymphoma Society: PreviewPal.pl  National Cancer Institute ([place]): www.cancer.gov Contact a health care provider if:  You have pain in your abdomen.  You develop new bruises that are getting bigger.  You have painful or more swollen lymph nodes.  You develop bleeding from your gums or nose.  You cannot eat or drink without vomiting.  You feel lightheaded. Get help right away if:  You have a fever or chills.  You develop chest pain.  You have trouble breathing or feel short of breath.  You faint.  There is blood in your urine or stool.  You have excessive bleeding.  You have any symptoms that are severe or uncontrolled. Summary  Chronic lymphocytic leukemia (CLL) is a type of cancer of the blood cells and bone marrow.  This condition can cause an enlarged spleen, swollen lymph nodes, a weakened immune system, low red blood cell and platelets counts, and autoimmune conditions.  Treatment for this condition depends on the stage of the cancer and whether you have  symptoms.  Chemotherapy, radiation, surgery to remove the spleen, and bone marrow transplant are some of the ways to treat CLL. This information is not intended to replace advice given to you by your health care provider. Make sure you discuss any questions you have with your health care provider. Document Released: 05/31/2008 Document Revised: 12/25/2015 Document Reviewed: 12/25/2015 Elsevier Interactive Patient Education  2017 [employer].

## 2016-09-23 ENCOUNTER — Ambulatory Visit (INDEPENDENT_AMBULATORY_CARE_PROVIDER_SITE_OTHER): Payer: Medicare HMO

## 2016-09-23 DIAGNOSIS — E538 Deficiency of other specified B group vitamins: Secondary | ICD-10-CM

## 2016-09-23 MED ORDER — CYANOCOBALAMIN 1000 MCG/ML IJ SOLN
1000.0000 ug | Freq: Once | INTRAMUSCULAR | Status: AC
  Administered 2016-09-23: 1000 ug via INTRAMUSCULAR

## 2016-09-24 ENCOUNTER — Telehealth: Payer: Self-pay

## 2016-09-24 NOTE — Telephone Encounter (Signed)
Okay to add on to my schedule

## 2016-09-24 NOTE — Telephone Encounter (Addendum)
Pt left v/m; pt recentlly diagnosed with Leukemia and pt having dull pain  middle upper stomach; pain comes and goes since 09/23/16 in AM.when pt mashes on area the entire upper abd feels hard; no N&V or fever.  Pt going on vacation on 09/29/16. Pt scheduled 09/25/16 at 7:15 AM with Dr Silvio Pate; if pt were to develop CP will go to ED prior to appt. (pt will be here at 7 AM to ck in at front desk).

## 2016-09-24 NOTE — Telephone Encounter (Signed)
Dr Silvio Pate in room with pt and I spoke with [facility] CMA and she said OK to leave at 09/25/16 at 7:15.

## 2016-09-25 ENCOUNTER — Encounter: Payer: Self-pay | Admitting: Internal Medicine

## 2016-09-25 ENCOUNTER — Ambulatory Visit (INDEPENDENT_AMBULATORY_CARE_PROVIDER_SITE_OTHER): Payer: Medicare HMO | Admitting: Internal Medicine

## 2016-09-25 VITALS — BP 108/70 | HR 62 | Temp 98.0°F | Wt 207.0 lb

## 2016-09-25 DIAGNOSIS — R1010 Upper abdominal pain, unspecified: Secondary | ICD-10-CM | POA: Insufficient documentation

## 2016-09-25 DIAGNOSIS — R1013 Epigastric pain: Secondary | ICD-10-CM

## 2016-09-25 NOTE — Patient Instructions (Addendum)
Please take over the counter omeprazole 20mg  daily on an empty stomach. If you are still having symptoms when you get back, send me a message and I will set up an ultrasound of your gallbladder. You can add under the tongue vitamin B12 500 mcg daily.

## 2016-09-25 NOTE — Assessment & Plan Note (Addendum)
Not severe and mostly in due to upcoming travel to [place], [place] and fear due to concern about her spleen. Reassured that I don't think it has anything to do with her spleen  Could be gastric ulcer or gastritis. No recent NSAID use Could be reflux related also Will have her take 2 weeks of PPI just in case Very likely early gallbladder disease---if ongoing symptoms would check ultrasound upon her return Advised small volume, low fat meals

## 2016-09-25 NOTE — Progress Notes (Signed)
Subjective:    Patient ID: Lori Shaw, female    DOB: July 24, 1945, 71 y.o.   MRN: [id number]  HPI Here due to intermittent abdominal pain  Has had bloating and epigrastric pain Started ~3 days ago--noticed after dinner Appetite off since then "It is there and I have the feeling I don't want to eat" No N/V Bowels are fine--no pain or blood No fever  Hasn't done anything for this  Current Outpatient Prescriptions on File Prior to Visit  Medication Sig Dispense Refill  . Cholecalciferol (VITAMIN D) 2000 units CAPS Take 1 capsule by mouth daily.    . fluticasone (FLONASE) 50 MCG/ACT nasal spray Place 2 sprays into both nostrils daily as needed. 16 g 11  . vitamin B-12 (CYANOCOBALAMIN) 1000 MCG tablet Take 1,000 mcg by mouth daily.     No current facility-administered medications on file prior to visit.     Allergies  Allergen Reactions  . Sulfa Antibiotics     Past Medical History:  Diagnosis Date  . CLL (chronic lymphocytic leukemia) ([place]) 06/21/2016   Rai stage 0, dx 06/2016 (Dr Janese Banks)  . HLD (hyperlipidemia)   . OSA on CPAP 2006, 04/2014   AHI of 23.8 and an RDI of 42.9, on CPAP 9 cm H2O ([facility])  . Osteopenia 08/2010   T -1.4 at L femur neck    Past Surgical History:  Procedure Laterality Date  . ABDOMINAL HYSTERECTOMY  01/27/1983   for menometrorrhagia, one ovary remained  . COLONOSCOPY  2012   no records available. per patient normal.   . dexa  08/2010   osteopenia  . TONSILLECTOMY  01/26/1973    Family History  Problem Relation Age of Onset  . Hyperlipidemia Mother   . CAD Mother        CABG  . Hyperlipidemia Father   . CAD Father        MI  . Arthritis Maternal Grandmother   . Cancer Sister        breast  . Breast cancer Sister 88  . Alzheimer's disease Unknown   . Heart attack Unknown   . Diabetes Neg Hx   . Stroke Neg Hx     Social History   Social History  . Marital status: Married    Spouse name: N/A  . Number of children: 2   . Years of education: 1.5   Occupational History  . retired    Social History Main Topics  . Smoking status: Former Smoker    Packs/day: 2.00    Years: 20.00    Types: Cigarettes    Quit date: 01/26/1978  . Smokeless tobacco: Never Used  . Alcohol use No  . Drug use: No  . Sexual activity: Yes   Other Topics Concern  . Not on file   Social History Narrative   Lives with daughter, 1 dog   Occupation: retired, has worked in Chief Strategy Officer, also Forensic psychologist   Edu: 1.5 yrs college   Activity: square dancing   Diet: good water, fruits/vegetables daily   Patient drinks two cups of caffeine daily.   Remarried.     Review of Systems Recent diagnosis of CLL--husband has been reading on this and is worried about her spleen No cough or breathing problems Lots of burping No heartburn or dysphagia    Objective:   Physical Exam  Constitutional: She appears well-nourished. No distress.  Neck: No thyromegaly present.  Cardiovascular: Normal rate, regular rhythm and normal heart sounds.  Exam reveals no gallop.   No murmur heard. Pulmonary/Chest: Effort normal and breath sounds normal. No respiratory distress. She has no wheezes. She has no rales.  Abdominal: Soft. Bowel sounds are normal. She exhibits no distension and no mass. There is no tenderness. There is no rebound and no guarding.  Negative Murphy's sign  Musculoskeletal: She exhibits no edema.  Lymphadenopathy:    She has no cervical adenopathy.  Psychiatric: She has a normal mood and affect. Her behavior is normal.          Assessment & Plan:

## 2016-10-08 ENCOUNTER — Ambulatory Visit (INDEPENDENT_AMBULATORY_CARE_PROVIDER_SITE_OTHER): Payer: Medicare HMO

## 2016-10-08 DIAGNOSIS — E538 Deficiency of other specified B group vitamins: Secondary | ICD-10-CM | POA: Diagnosis not present

## 2016-10-08 MED ORDER — CYANOCOBALAMIN 1000 MCG/ML IJ SOLN
1000.0000 ug | Freq: Once | INTRAMUSCULAR | Status: AC
  Administered 2016-10-08: 1000 ug via INTRAMUSCULAR

## 2016-10-16 ENCOUNTER — Inpatient Hospital Stay: Payer: Medicare HMO | Attending: Oncology

## 2016-10-16 DIAGNOSIS — C9110 Chronic lymphocytic leukemia of B-cell type not having achieved remission: Secondary | ICD-10-CM | POA: Diagnosis not present

## 2016-10-16 LAB — CBC WITH DIFFERENTIAL/PLATELET
Basophils Absolute: 0.2 10*3/uL — ABNORMAL HIGH (ref 0–0.1)
Basophils Relative: 2 %
Eosinophils Absolute: 0.3 10*3/uL (ref 0–0.7)
Eosinophils Relative: 2 %
HEMATOCRIT: 43.4 % (ref 35.0–47.0)
HEMOGLOBIN: 14.8 g/dL (ref 12.0–16.0)
LYMPHS ABS: 9.5 10*3/uL — AB (ref 1.0–3.6)
LYMPHS PCT: 62 %
MCH: 30.4 pg (ref 26.0–34.0)
MCHC: 34.1 g/dL (ref 32.0–36.0)
MCV: 89.3 fL (ref 80.0–100.0)
Monocytes Absolute: 1 10*3/uL — ABNORMAL HIGH (ref 0.2–0.9)
Monocytes Relative: 7 %
NEUTROS ABS: 4 10*3/uL (ref 1.4–6.5)
NEUTROS PCT: 27 %
Platelets: 192 10*3/uL (ref 150–440)
RBC: 4.86 MIL/uL (ref 3.80–5.20)
RDW: 13.9 % (ref 11.5–14.5)
WBC: 15.2 10*3/uL — AB (ref 3.6–11.0)

## 2016-10-22 ENCOUNTER — Ambulatory Visit (INDEPENDENT_AMBULATORY_CARE_PROVIDER_SITE_OTHER): Payer: Medicare HMO | Admitting: *Deleted

## 2016-10-22 DIAGNOSIS — E538 Deficiency of other specified B group vitamins: Secondary | ICD-10-CM

## 2016-10-22 MED ORDER — CYANOCOBALAMIN 1000 MCG/ML IJ SOLN
1000.0000 ug | Freq: Once | INTRAMUSCULAR | Status: AC
  Administered 2016-10-22: 1000 ug via INTRAMUSCULAR

## 2016-10-29 ENCOUNTER — Ambulatory Visit: Payer: Medicare HMO | Admitting: Family Medicine

## 2016-11-05 ENCOUNTER — Ambulatory Visit: Payer: Medicare HMO | Admitting: Family Medicine

## 2016-11-10 ENCOUNTER — Ambulatory Visit (INDEPENDENT_AMBULATORY_CARE_PROVIDER_SITE_OTHER): Payer: Medicare HMO | Admitting: Family Medicine

## 2016-11-10 ENCOUNTER — Encounter: Payer: Self-pay | Admitting: Family Medicine

## 2016-11-10 VITALS — BP 120/82 | HR 69 | Temp 97.6°F | Wt 208.0 lb

## 2016-11-10 DIAGNOSIS — C9190 Lymphoid leukemia, unspecified not having achieved remission: Secondary | ICD-10-CM

## 2016-11-10 DIAGNOSIS — Z23 Encounter for immunization: Secondary | ICD-10-CM | POA: Diagnosis not present

## 2016-11-10 DIAGNOSIS — R5383 Other fatigue: Secondary | ICD-10-CM | POA: Diagnosis not present

## 2016-11-10 DIAGNOSIS — R1013 Epigastric pain: Secondary | ICD-10-CM | POA: Diagnosis not present

## 2016-11-10 DIAGNOSIS — C9110 Chronic lymphocytic leukemia of B-cell type not having achieved remission: Secondary | ICD-10-CM

## 2016-11-10 DIAGNOSIS — E538 Deficiency of other specified B group vitamins: Secondary | ICD-10-CM

## 2016-11-10 MED ORDER — CYANOCOBALAMIN 1000 MCG/ML IJ SOLN
1000.0000 ug | Freq: Once | INTRAMUSCULAR | Status: AC
  Administered 2016-11-10: 1000 ug via INTRAMUSCULAR

## 2016-11-10 NOTE — Assessment & Plan Note (Signed)
Improving with b12 replacement.

## 2016-11-10 NOTE — Addendum Note (Signed)
Addended by: Brenton Grills on: 86/76/7209 04:09 PM   Modules accepted: Orders

## 2016-11-10 NOTE — Progress Notes (Signed)
BP 120/82 (BP Location: Left Arm, Patient Position: Sitting, Cuff Size: Large)   Pulse 69   Temp 97.6 F (36.4 C) (Oral)   Wt 208 lb (94.3 kg)   SpO2 97%   BMI 34.61 kg/m    CC: f/u visit Subjective:    Patient ID: Lori Shaw, female    DOB: 1945/09/19, 71 y.o.   MRN: [id number]  HPI: Lori Shaw is a 71 y.o. female presenting on 11/10/2016 for 4 mo follow-up (Needs vit B12 inj)   Husband currently in ICU with infection. H/o NSIP and MS. She spent all last night with him.   Seen late 08/2016 by Dr Silvio Pate with epigastric abdominal discomfort thought possible gallbladder disease, if ongoing rec abd [name]. However, omeprazole 20mg  daily did resolve symptoms.   Recent dx stage 0 CLL seen by Dr Janese Banks oncology - planned close monitoring.   Ongoing low vitamin B12 treated weekly starting July, then biweekly in September. Plan transition to monthly in November. Rec sublingual B12 as well - she noticing improvement with 2524mcg daily.  Lab Results  Component Value Date   VITAMINB12 164 (L) 06/19/2016     Relevant past medical, surgical, family and social history reviewed and updated as indicated. Interim medical history since our last visit reviewed. Allergies and medications reviewed and updated. Outpatient Medications Prior to Visit  Medication Sig Dispense Refill  . Cholecalciferol (VITAMIN D) 2000 units CAPS Take 1 capsule by mouth daily.    . fluticasone (FLONASE) 50 MCG/ACT nasal spray Place 2 sprays into both nostrils daily as needed. 16 g 11  . vitamin B-12 (CYANOCOBALAMIN) 1000 MCG tablet Take 1,000 mcg by mouth daily.     No facility-administered medications prior to visit.      Per HPI unless specifically indicated in ROS section below Review of Systems     Objective:    BP 120/82 (BP Location: Left Arm, Patient Position: Sitting, Cuff Size: Large)   Pulse 69   Temp 97.6 F (36.4 C) (Oral)   Wt 208 lb (94.3 kg)   SpO2 97%   BMI 34.61 kg/m     Wt Readings from Last 3 Encounters:  11/10/16 208 lb (94.3 kg)  09/25/16 207 lb (93.9 kg)  09/17/16 209 lb (94.8 kg)    Physical Exam  Constitutional: She appears well-developed and well-nourished. No distress.  HENT:  Mouth/Throat: Oropharynx is clear and moist. No oropharyngeal exudate.  Cardiovascular: Normal rate, regular rhythm, normal heart sounds and intact distal pulses.   No murmur heard. Pulmonary/Chest: Effort normal and breath sounds normal. No respiratory distress. She has no wheezes. She has no rales.  Abdominal: Bowel sounds are normal. She exhibits no distension and no mass. There is no hepatosplenomegaly. There is no tenderness. There is no rebound and no guarding.  Lymphadenopathy:    She has no cervical adenopathy.  Psychiatric: She has a normal mood and affect.  Nursing note and vitals reviewed.  Results for orders placed or performed in visit on 10/16/16  CBC with Differential  Result Value Ref Range   WBC 15.2 (H) 3.6 - 11.0 K/uL   RBC 4.86 3.80 - 5.20 MIL/uL   Hemoglobin 14.8 12.0 - 16.0 g/dL   HCT 43.4 35.0 - 47.0 %   MCV 89.3 80.0 - 100.0 fL   MCH 30.4 26.0 - 34.0 pg   MCHC 34.1 32.0 - 36.0 g/dL   RDW 13.9 11.5 - 14.5 %   Platelets 192 150 - 440 K/uL  Neutrophils Relative % 27 %   Neutro Abs 4.0 1.4 - 6.5 K/uL   Lymphocytes Relative 62 %   Lymphs Abs 9.5 (H) 1.0 - 3.6 K/uL   Monocytes Relative 7 %   Monocytes Absolute 1.0 (H) 0.2 - 0.9 K/uL   Eosinophils Relative 2 %   Eosinophils Absolute 0.3 0 - 0.7 K/uL   Basophils Relative 2 %   Basophils Absolute 0.2 (H) 0 - 0.1 K/uL      Assessment & Plan:   Problem List Items Addressed This Visit    CLL (chronic lymphocytic leukemia) ([place])    Appreciate onc care.       Epigastric abdominal pain    This has improved with PPI.       Fatigue    Improving with b12 replacement.       Vitamin B12 deficiency - Primary    Doing well with Q2 wk b12 shots. Continue this for the next month then  consider transitioning to Q monthly b12 shots starting November as long as tolerated well.  Pt has also started sublingual 2565mcg daily - continue this.           Follow up plan: Return in about 3 months (around 02/10/2017) for follow up visit.  Ria Bush, MD

## 2016-11-10 NOTE — Patient Instructions (Addendum)
Flu shot today, b12 shot today.  I'm glad you're doing well. Continue sublingual b12.  We will recheck blood work next visit. Return in 3-4 months for follow up visit

## 2016-11-10 NOTE — Assessment & Plan Note (Signed)
Doing well with Q2 wk b12 shots. Continue this for the next month then consider transitioning to Q monthly b12 shots starting November as long as tolerated well.  Pt has also started sublingual 2592mcg daily - continue this.

## 2016-11-10 NOTE — Assessment & Plan Note (Signed)
This has improved with PPI.  

## 2016-11-10 NOTE — Assessment & Plan Note (Signed)
Appreciate onc care.  

## 2016-11-25 ENCOUNTER — Ambulatory Visit (INDEPENDENT_AMBULATORY_CARE_PROVIDER_SITE_OTHER): Payer: Medicare HMO

## 2016-11-25 DIAGNOSIS — E538 Deficiency of other specified B group vitamins: Secondary | ICD-10-CM

## 2016-11-25 MED ORDER — CYANOCOBALAMIN 1000 MCG/ML IJ SOLN
1000.0000 ug | Freq: Once | INTRAMUSCULAR | Status: AC
  Administered 2016-11-25: 1000 ug via INTRAMUSCULAR

## 2016-12-09 ENCOUNTER — Ambulatory Visit (INDEPENDENT_AMBULATORY_CARE_PROVIDER_SITE_OTHER): Payer: Medicare HMO

## 2016-12-09 DIAGNOSIS — E538 Deficiency of other specified B group vitamins: Secondary | ICD-10-CM

## 2016-12-09 MED ORDER — CYANOCOBALAMIN 1000 MCG/ML IJ SOLN
1000.0000 ug | Freq: Once | INTRAMUSCULAR | Status: AC
  Administered 2016-12-09: 1000 ug via INTRAMUSCULAR

## 2016-12-14 DIAGNOSIS — G4733 Obstructive sleep apnea (adult) (pediatric): Secondary | ICD-10-CM | POA: Diagnosis not present

## 2016-12-16 DIAGNOSIS — G4733 Obstructive sleep apnea (adult) (pediatric): Secondary | ICD-10-CM | POA: Diagnosis not present

## 2016-12-25 ENCOUNTER — Ambulatory Visit (INDEPENDENT_AMBULATORY_CARE_PROVIDER_SITE_OTHER): Payer: Medicare HMO

## 2016-12-25 DIAGNOSIS — E538 Deficiency of other specified B group vitamins: Secondary | ICD-10-CM

## 2016-12-25 MED ORDER — CYANOCOBALAMIN 1000 MCG/ML IJ SOLN
1000.0000 ug | Freq: Once | INTRAMUSCULAR | Status: AC
  Administered 2016-12-25: 1000 ug via INTRAMUSCULAR

## 2017-01-08 ENCOUNTER — Inpatient Hospital Stay (HOSPITAL_BASED_OUTPATIENT_CLINIC_OR_DEPARTMENT_OTHER): Payer: Medicare HMO | Admitting: Oncology

## 2017-01-08 ENCOUNTER — Inpatient Hospital Stay: Payer: Medicare HMO | Attending: Oncology

## 2017-01-08 ENCOUNTER — Encounter: Payer: Self-pay | Admitting: Oncology

## 2017-01-08 VITALS — BP 111/69 | HR 83 | Temp 97.4°F | Resp 16 | Wt 207.0 lb

## 2017-01-08 DIAGNOSIS — C9110 Chronic lymphocytic leukemia of B-cell type not having achieved remission: Secondary | ICD-10-CM

## 2017-01-08 DIAGNOSIS — M25551 Pain in right hip: Secondary | ICD-10-CM | POA: Insufficient documentation

## 2017-01-08 DIAGNOSIS — Z90710 Acquired absence of both cervix and uterus: Secondary | ICD-10-CM | POA: Insufficient documentation

## 2017-01-08 DIAGNOSIS — D72829 Elevated white blood cell count, unspecified: Secondary | ICD-10-CM

## 2017-01-08 DIAGNOSIS — E785 Hyperlipidemia, unspecified: Secondary | ICD-10-CM | POA: Diagnosis not present

## 2017-01-08 DIAGNOSIS — G4733 Obstructive sleep apnea (adult) (pediatric): Secondary | ICD-10-CM

## 2017-01-08 DIAGNOSIS — R5381 Other malaise: Secondary | ICD-10-CM | POA: Insufficient documentation

## 2017-01-08 DIAGNOSIS — D649 Anemia, unspecified: Secondary | ICD-10-CM | POA: Diagnosis not present

## 2017-01-08 DIAGNOSIS — Z9989 Dependence on other enabling machines and devices: Secondary | ICD-10-CM

## 2017-01-08 DIAGNOSIS — E860 Dehydration: Secondary | ICD-10-CM | POA: Diagnosis not present

## 2017-01-08 DIAGNOSIS — Z87891 Personal history of nicotine dependence: Secondary | ICD-10-CM | POA: Insufficient documentation

## 2017-01-08 DIAGNOSIS — R5383 Other fatigue: Secondary | ICD-10-CM | POA: Diagnosis not present

## 2017-01-08 DIAGNOSIS — D751 Secondary polycythemia: Secondary | ICD-10-CM

## 2017-01-08 DIAGNOSIS — M8580 Other specified disorders of bone density and structure, unspecified site: Secondary | ICD-10-CM

## 2017-01-08 DIAGNOSIS — Z79899 Other long term (current) drug therapy: Secondary | ICD-10-CM | POA: Insufficient documentation

## 2017-01-08 LAB — CBC WITH DIFFERENTIAL/PLATELET
Basophils Absolute: 0.1 10*3/uL (ref 0–0.1)
Basophils Relative: 1 %
EOS ABS: 0.3 10*3/uL (ref 0–0.7)
EOS PCT: 2 %
HCT: 51.9 % — ABNORMAL HIGH (ref 35.0–47.0)
Hemoglobin: 17 g/dL — ABNORMAL HIGH (ref 12.0–16.0)
LYMPHS ABS: 9.4 10*3/uL — AB (ref 1.0–3.6)
Lymphocytes Relative: 62 %
MCH: 29.8 pg (ref 26.0–34.0)
MCHC: 32.8 g/dL (ref 32.0–36.0)
MCV: 90.8 fL (ref 80.0–100.0)
MONO ABS: 0.8 10*3/uL (ref 0.2–0.9)
Monocytes Relative: 5 %
Neutro Abs: 4.5 10*3/uL (ref 1.4–6.5)
Neutrophils Relative %: 30 %
PLATELETS: 167 10*3/uL (ref 150–440)
RBC: 5.71 MIL/uL — ABNORMAL HIGH (ref 3.80–5.20)
RDW: 13.9 % (ref 11.5–14.5)
WBC: 15.2 10*3/uL — ABNORMAL HIGH (ref 3.6–11.0)

## 2017-01-08 NOTE — Progress Notes (Signed)
Patient here for follow up with labs today. She states that she feels tired all the time. She also states that she has been having a sensation on her right side that feels like burning and numbness in her right hip that radiates down into her right leg, for several months. She reports having a dizzy spell in the lab today after getting her blood drawn, when she stood up to leave the lab, which has never happened before.

## 2017-01-08 NOTE — Progress Notes (Signed)
Hematology/Oncology Consult note [facility]  [phone] [phone]  Patient Care Team: Ria Bush, MD as PCP - General (Family Medicine) Solon Augusta, MD as Attending Physician (Ophthalmology) Elbert Ewings, DDS as Consulting Physician (Dentistry)   Name of the patient: [place]  [id number]  Nov 21, 1945   Date of visit: 01/08/17  Diagnosis-Rai stage 0 CLL  Chief complaint/ Reason for visit-routine follow-up of CLL  Heme/Onc history: patient is a 71 year old female with no significant past medical history other than low B12 and low vitamin D levels. Patient was recently noted to have leukocytosis with a white count of 13. Differential mainly showed lymphocytosis. Recent CBC from 07/03/2016 showed white count of 13, H&H of 14.7/44 and a platelet count of 207. Differential showed 63.5% lymphocytes. Parapharyngeal flow cytometry showed CD5 positive, CD23 positive clonal B-cell population CLL/SLL phenotype. She has been referred to [name] for the same. Overall patient is doing well and denies any symptoms of fevers, chills, fatigue, drenching night sweats or unintentional weight loss. Denies any lumps or bumps anywhere or recurrent infections.   Interval history-reports feeling fatigued.  Also reports burning pain in her right hip  ECOG PS- 0 Pain scale- 0  Review of systems- Review of Systems  Constitutional: Positive for malaise/fatigue. Negative for chills, fever and weight loss.  HENT: Negative for congestion, ear discharge and nosebleeds.   Eyes: Negative for blurred vision.  Respiratory: Negative for cough, hemoptysis, sputum production, shortness of breath and wheezing.   Cardiovascular: Negative for chest pain, palpitations, orthopnea and claudication.  Gastrointestinal: Negative for abdominal pain, blood in stool, constipation, diarrhea, heartburn, melena, nausea and vomiting.  Genitourinary: Negative for dysuria,  flank pain, frequency, hematuria and urgency.  Musculoskeletal: Positive for joint pain. Negative for back pain and myalgias.  Skin: Negative for rash.  Neurological: Negative for dizziness, tingling, focal weakness, seizures, weakness and headaches.  Endo/Heme/Allergies: Does not bruise/bleed easily.  Psychiatric/Behavioral: Negative for depression and suicidal ideas. The patient does not have insomnia.      Allergies  Allergen Reactions  . Sulfa Antibiotics      Past Medical History:  Diagnosis Date  . CLL (chronic lymphocytic leukemia) ([place]) 06/21/2016   Rai stage 0, dx 06/2016 (Dr Janese Banks)  . HLD (hyperlipidemia)   . OSA on CPAP 2006, 04/2014   AHI of 23.8 and an RDI of 42.9, on CPAP 9 cm H2O ([facility])  . Osteopenia 08/2010   T -1.4 at L femur neck     Past Surgical History:  Procedure Laterality Date  . ABDOMINAL HYSTERECTOMY  01/27/1983   for menometrorrhagia, one ovary remained  . COLONOSCOPY  2012   no records available. per patient normal.   . dexa  08/2010   osteopenia  . TONSILLECTOMY  01/26/1973    Social History   Socioeconomic History  . Marital status: Married    Spouse name: Not on file  . Number of children: 2  . Years of education: 1.5  . Highest education level: Not on file  Social Needs  . Financial resource strain: Not on file  . Food insecurity - worry: Not on file  . Food insecurity - inability: Not on file  . Transportation needs - medical: Not on file  . Transportation needs - non-medical: Not on file  Occupational History  . Occupation: retired  Tobacco Use  . Smoking status: Former Smoker    Packs/day: 2.00    Years: 20.00    Pack years: 40.00  Types: Cigarettes    Last attempt to quit: 01/26/1978    Years since quitting: 38.9  . Smokeless tobacco: Never Used  Substance and Sexual Activity  . Alcohol use: No  . Drug use: No  . Sexual activity: Yes  Other Topics Concern  . Not on file  Social History Narrative   Lives with  daughter, 1 dog   Occupation: retired, has worked in Chief Strategy Officer, also Forensic psychologist   Edu: 1.5 yrs college   Activity: square dancing   Diet: good water, fruits/vegetables daily   Patient drinks two cups of caffeine daily.   Remarried.      Family History  Problem Relation Age of Onset  . Hyperlipidemia Mother   . CAD Mother        CABG  . Hyperlipidemia Father   . CAD Father        MI  . Arthritis Maternal Grandmother   . Cancer Sister        breast  . Breast cancer Sister 2  . Alzheimer's disease Unknown   . Heart attack Unknown   . Diabetes Neg Hx   . Stroke Neg Hx      Current Outpatient Medications:  .  Cholecalciferol (VITAMIN D) 2000 units CAPS, Take 1 capsule by mouth daily., Disp: , Rfl:  .  cyanocobalamin (,VITAMIN B-12,) 1000 MCG/ML injection, Inject 1 mL (1,000 mcg total) into the muscle every 14 (fourteen) days., Disp: , Rfl:  .  fluticasone (FLONASE) 50 MCG/ACT nasal spray, Place 2 sprays into both nostrils daily as needed., Disp: 16 g, Rfl: 11 .  vitamin B-12 (CYANOCOBALAMIN) 1000 MCG tablet, Take 1,000 mcg by mouth daily., Disp: , Rfl:   Physical exam:  Vitals:   01/08/17 1433 01/08/17 1437 01/08/17 1444  BP:  111/69   Pulse:   83  Resp:  16   Temp:  (!) 97.4 F (36.3 C)   TempSrc:  Tympanic   Weight: 207 lb (93.9 kg) 207 lb (93.9 kg)    Physical Exam  Constitutional: She is oriented to person, place, and time and well-developed, well-nourished, and in no distress.  HENT:  Head: Normocephalic and atraumatic.  Eyes: EOM are normal. Pupils are equal, round, and reactive to light.  Neck: Normal range of motion.  Cardiovascular: Normal rate, regular rhythm and normal heart sounds.  Pulmonary/Chest: Effort normal and breath sounds normal.  Abdominal: Soft. Bowel sounds are normal.  No palpable splenomegaly  Lymphadenopathy:  No palpable cervical, supraclavicular, submandibular, axillary or inguinal adenopathy  Neurological: She is alert and  oriented to person, place, and time.  Skin: Skin is warm and dry.     CMP Latest Ref Rng & Units 06/19/2016  Glucose 70 - 99 mg/dL 98  BUN 6 - 23 mg/dL 14  Creatinine 0.40 - 1.20 mg/dL 0.85  Sodium 135 - 145 mEq/L 141  Potassium 3.5 - 5.1 mEq/L 4.3  Chloride 96 - 112 mEq/L 106  CO2 19 - 32 mEq/L 29  Calcium 8.4 - 10.5 mg/dL 9.4  Total Protein 6.0 - 8.3 g/dL 6.5  Total Bilirubin 0.2 - 1.2 mg/dL 0.6  Alkaline Phos 39 - 117 U/L 66  AST 0 - 37 U/L 18  ALT 0 - 35 U/L 24   CBC Latest Ref Rng & Units 01/08/2017  WBC 3.6 - 11.0 K/uL 15.2(H)  Hemoglobin 12.0 - 16.0 g/dL 17.0(H)  Hematocrit 35.0 - 47.0 % 51.9(H)  Platelets 150 - 440 K/uL 167  Assessment and plan- Patient is a 71 y.o. female with Rai stage 0 CLL currently on observation  Clinically patient is doing well.  She does not have any B symptoms such as drenching night sweats, unintentional weight loss.  No palpable adenopathy or splenomegaly.  Absolute lymphocyte count has remained stable between 8-10.  No indication to treat her CLL at this time.  Repeat CBC with differential in 6 months and I will see her back in 1 years time  Patient noted to have polycythemia with a hemoglobin of 17.  She is not known to have polycythemia before.  This could be a one-time falsely elevated value due to dehydration.  We will repeat her CBC with differential in 3 months time to follow-up her polycythemia   right hip pain: She will speak to her PCP regarding this.  This is unrelated to her CLL     Visit Diagnosis 1. CLL (chronic lymphocytic leukemia) ([place])      Dr. Randa Evens, MD, MPH [facility] Pager- [phone] 01/08/2017 10:07 AM

## 2017-01-21 ENCOUNTER — Ambulatory Visit (INDEPENDENT_AMBULATORY_CARE_PROVIDER_SITE_OTHER): Payer: Medicare HMO | Admitting: *Deleted

## 2017-01-21 DIAGNOSIS — E538 Deficiency of other specified B group vitamins: Secondary | ICD-10-CM

## 2017-01-21 MED ORDER — CYANOCOBALAMIN 1000 MCG/ML IJ SOLN
1000.0000 ug | Freq: Once | INTRAMUSCULAR | Status: AC
  Administered 2017-01-21: 1000 ug via INTRAMUSCULAR

## 2017-02-11 ENCOUNTER — Ambulatory Visit (INDEPENDENT_AMBULATORY_CARE_PROVIDER_SITE_OTHER): Payer: Medicare HMO

## 2017-02-11 DIAGNOSIS — E538 Deficiency of other specified B group vitamins: Secondary | ICD-10-CM

## 2017-02-11 MED ORDER — CYANOCOBALAMIN 1000 MCG/ML IJ SOLN
1000.0000 ug | Freq: Once | INTRAMUSCULAR | Status: AC
  Administered 2017-02-11: 1000 ug via INTRAMUSCULAR

## 2017-02-16 ENCOUNTER — Ambulatory Visit (INDEPENDENT_AMBULATORY_CARE_PROVIDER_SITE_OTHER): Payer: Medicare HMO | Admitting: Family Medicine

## 2017-02-16 ENCOUNTER — Encounter: Payer: Self-pay | Admitting: Family Medicine

## 2017-02-16 VITALS — BP 118/66 | HR 70 | Temp 97.9°F | Wt 207.5 lb

## 2017-02-16 DIAGNOSIS — E559 Vitamin D deficiency, unspecified: Secondary | ICD-10-CM | POA: Diagnosis not present

## 2017-02-16 DIAGNOSIS — R5383 Other fatigue: Secondary | ICD-10-CM | POA: Diagnosis not present

## 2017-02-16 DIAGNOSIS — C9190 Lymphoid leukemia, unspecified not having achieved remission: Secondary | ICD-10-CM

## 2017-02-16 DIAGNOSIS — E538 Deficiency of other specified B group vitamins: Secondary | ICD-10-CM

## 2017-02-16 DIAGNOSIS — C9110 Chronic lymphocytic leukemia of B-cell type not having achieved remission: Secondary | ICD-10-CM

## 2017-02-16 MED ORDER — VITAMIN B-12 2500 MCG SL SUBL: 1.0000 | SUBLINGUAL_TABLET | Freq: Every day | SUBLINGUAL | 0 refills | Status: AC

## 2017-02-16 NOTE — Assessment & Plan Note (Signed)
Significant improvement with b12 replacement.

## 2017-02-16 NOTE — Assessment & Plan Note (Signed)
Update when returns fasting.

## 2017-02-16 NOTE — Patient Instructions (Addendum)
I think you're doing well today.  Come in for next b12 shot but get labs done before the shot.  Return for monthly b12 shots and after 06/30/2017 for medicare wellness visit with Katha Cabal and physical with me.

## 2017-02-16 NOTE — Assessment & Plan Note (Signed)
Stable period. Appreciate onc care.

## 2017-02-16 NOTE — Assessment & Plan Note (Signed)
Feeling better with SL 2564mcg daily and 1075mcg B12 shots monthly - will check levels prior to next B12 shot in 3 wks. Labs ordered today. Pt agrees with plan.

## 2017-02-16 NOTE — Progress Notes (Signed)
BP 118/66 (BP Location: Left Arm, Patient Position: Sitting, Cuff Size: Normal)   Pulse 70   Temp 97.9 F (36.6 C) (Oral)   Wt 207 lb 8 oz (94.1 kg)   SpO2 97%   BMI 34.53 kg/m    CC: 3 mo f/u visit Subjective:    Patient ID: Lori Shaw, female    DOB: 23-May-1945, 72 y.o.   MRN: [id number]  HPI: Shariah Assad is a 72 y.o. female presenting on 02/16/2017 for 3-4 mo follow-up   B12 deficiency - now on monthly B12 shots as well as sublinguial 2553mcg daily.  Prior R lateral thigh burning pain/numbness is resolving with B12 replacement. Feeling well today. CLL stable.   Relevant past medical, surgical, family and social history reviewed and updated as indicated. Interim medical history since our last visit reviewed. Allergies and medications reviewed and updated. Outpatient Medications Prior to Visit  Medication Sig Dispense Refill  . Cholecalciferol (VITAMIN D) 2000 units CAPS Take 1 capsule by mouth daily.    . cyanocobalamin (,VITAMIN B-12,) 1000 MCG/ML injection Inject 1 mL (1,000 mcg total) into the muscle every 14 (fourteen) days.    . fluticasone (FLONASE) 50 MCG/ACT nasal spray Place 2 sprays into both nostrils daily as needed. 16 g 11  . vitamin B-12 (CYANOCOBALAMIN) 1000 MCG tablet Take 1,000 mcg by mouth daily.     No facility-administered medications prior to visit.      Per HPI unless specifically indicated in ROS section below Review of Systems     Objective:    BP 118/66 (BP Location: Left Arm, Patient Position: Sitting, Cuff Size: Normal)   Pulse 70   Temp 97.9 F (36.6 C) (Oral)   Wt 207 lb 8 oz (94.1 kg)   SpO2 97%   BMI 34.53 kg/m   Wt Readings from Last 3 Encounters:  02/16/17 207 lb 8 oz (94.1 kg)  01/08/17 207 lb (93.9 kg)  11/10/16 208 lb (94.3 kg)    Physical Exam  Constitutional: She appears well-developed and well-nourished. No distress.  In good spirits today  Psychiatric: She has a normal mood and affect.    Nursing note and vitals reviewed.  Results for orders placed or performed in visit on 01/08/17  CBC with Differential  Result Value Ref Range   WBC 15.2 (H) 3.6 - 11.0 K/uL   RBC 5.71 (H) 3.80 - 5.20 MIL/uL   Hemoglobin 17.0 (H) 12.0 - 16.0 g/dL   HCT 51.9 (H) 35.0 - 47.0 %   MCV 90.8 80.0 - 100.0 fL   MCH 29.8 26.0 - 34.0 pg   MCHC 32.8 32.0 - 36.0 g/dL   RDW 13.9 11.5 - 14.5 %   Platelets 167 150 - 440 K/uL   Neutrophils Relative % 30 %   Neutro Abs 4.5 1.4 - 6.5 K/uL   Lymphocytes Relative 62 %   Lymphs Abs 9.4 (H) 1.0 - 3.6 K/uL   Monocytes Relative 5 %   Monocytes Absolute 0.8 0.2 - 0.9 K/uL   Eosinophils Relative 2 %   Eosinophils Absolute 0.3 0 - 0.7 K/uL   Basophils Relative 1 %   Basophils Absolute 0.1 0 - 0.1 K/uL   Lab Results  Component Value Date   VITAMINB12 164 (L) 06/19/2016       Assessment & Plan:   Problem List Items Addressed This Visit    CLL (chronic lymphocytic leukemia) ([facility])    Stable period. Appreciate onc care.  Fatigue    Significant improvement with b12 replacement.       Vitamin B12 deficiency - Primary    Feeling better with SL 2564mcg daily and 1046mcg B12 shots monthly - will check levels prior to next B12 shot in 3 wks. Labs ordered today. Pt agrees with plan.       Relevant Orders   Vitamin B12   Vitamin D deficiency    Update when returns fasting.       Relevant Orders   VITAMIN D 25 Hydroxy (Vit-D Deficiency, Fractures)       Follow up plan: Return in about 5 months (around 07/17/2017) for annual exam, prior fasting for blood work, medicare wellness visit.  Ria Bush, MD

## 2017-03-02 DIAGNOSIS — R69 Illness, unspecified: Secondary | ICD-10-CM | POA: Diagnosis not present

## 2017-03-04 ENCOUNTER — Other Ambulatory Visit (INDEPENDENT_AMBULATORY_CARE_PROVIDER_SITE_OTHER): Payer: Medicare HMO

## 2017-03-04 ENCOUNTER — Ambulatory Visit (INDEPENDENT_AMBULATORY_CARE_PROVIDER_SITE_OTHER): Payer: Medicare HMO | Admitting: *Deleted

## 2017-03-04 DIAGNOSIS — E559 Vitamin D deficiency, unspecified: Secondary | ICD-10-CM

## 2017-03-04 DIAGNOSIS — E538 Deficiency of other specified B group vitamins: Secondary | ICD-10-CM

## 2017-03-04 LAB — VITAMIN D 25 HYDROXY (VIT D DEFICIENCY, FRACTURES): VITD: 22.26 ng/mL — ABNORMAL LOW (ref 30.00–100.00)

## 2017-03-04 LAB — VITAMIN B12: VITAMIN B 12: 802 pg/mL (ref 211–911)

## 2017-03-04 MED ORDER — CYANOCOBALAMIN 1000 MCG/ML IJ SOLN
1000.0000 ug | Freq: Once | INTRAMUSCULAR | Status: AC
  Administered 2017-03-04: 1000 ug via INTRAMUSCULAR

## 2017-03-23 DIAGNOSIS — G4733 Obstructive sleep apnea (adult) (pediatric): Secondary | ICD-10-CM | POA: Diagnosis not present

## 2017-04-06 ENCOUNTER — Telehealth: Payer: Self-pay | Admitting: *Deleted

## 2017-04-06 ENCOUNTER — Inpatient Hospital Stay: Payer: Medicare HMO | Attending: Oncology

## 2017-04-06 DIAGNOSIS — C9190 Lymphoid leukemia, unspecified not having achieved remission: Secondary | ICD-10-CM | POA: Insufficient documentation

## 2017-04-06 DIAGNOSIS — C9110 Chronic lymphocytic leukemia of B-cell type not having achieved remission: Secondary | ICD-10-CM

## 2017-04-06 LAB — CBC WITH DIFFERENTIAL/PLATELET
BASOS ABS: 0.1 10*3/uL (ref 0–0.1)
Basophils Relative: 1 %
EOS ABS: 0.3 10*3/uL (ref 0–0.7)
EOS PCT: 2 %
HCT: 44.8 % (ref 35.0–47.0)
Hemoglobin: 15.3 g/dL (ref 12.0–16.0)
LYMPHS PCT: 64 %
Lymphs Abs: 12 10*3/uL — ABNORMAL HIGH (ref 1.0–3.6)
MCH: 30.9 pg (ref 26.0–34.0)
MCHC: 34.1 g/dL (ref 32.0–36.0)
MCV: 90.7 fL (ref 80.0–100.0)
MONO ABS: 0.8 10*3/uL (ref 0.2–0.9)
Monocytes Relative: 5 %
Neutro Abs: 5.2 10*3/uL (ref 1.4–6.5)
Neutrophils Relative %: 28 %
PLATELETS: 207 10*3/uL (ref 150–440)
RBC: 4.94 MIL/uL (ref 3.80–5.20)
RDW: 14.5 % (ref 11.5–14.5)
WBC: 18.4 10*3/uL — AB (ref 3.6–11.0)

## 2017-04-06 NOTE — Telephone Encounter (Signed)
Called pt and left message that her wbc was 18 and it come up a few points from last one 15.  Instead of waiting 6 months to come back she would like to repeat cbc in 3 months from today. If she could call me and let me know if that  Is ok with her then I will make another lab appt in 3 months. Will await her call back.

## 2017-04-06 NOTE — Telephone Encounter (Signed)
-----   Message from Sindy Guadeloupe, MD sent at 04/06/2017  3:59 PM EDT ----- Repeat cbc with diff in 3 months. Thanks, Astrid Divine

## 2017-04-07 ENCOUNTER — Other Ambulatory Visit: Payer: Self-pay | Admitting: *Deleted

## 2017-04-07 DIAGNOSIS — C9110 Chronic lymphocytic leukemia of B-cell type not having achieved remission: Secondary | ICD-10-CM

## 2017-04-07 NOTE — Telephone Encounter (Signed)
Patient called back and said that she already had a 3 month appt in June so Doni told her to keep appt.

## 2017-04-12 ENCOUNTER — Ambulatory Visit: Payer: Self-pay | Admitting: Family Medicine

## 2017-05-05 ENCOUNTER — Other Ambulatory Visit: Payer: Self-pay | Admitting: Family Medicine

## 2017-05-05 DIAGNOSIS — Z1231 Encounter for screening mammogram for malignant neoplasm of breast: Secondary | ICD-10-CM

## 2017-05-10 DIAGNOSIS — D3131 Benign neoplasm of right choroid: Secondary | ICD-10-CM | POA: Diagnosis not present

## 2017-05-10 DIAGNOSIS — H5203 Hypermetropia, bilateral: Secondary | ICD-10-CM | POA: Diagnosis not present

## 2017-05-10 DIAGNOSIS — D3132 Benign neoplasm of left choroid: Secondary | ICD-10-CM | POA: Diagnosis not present

## 2017-05-10 DIAGNOSIS — H2513 Age-related nuclear cataract, bilateral: Secondary | ICD-10-CM | POA: Diagnosis not present

## 2017-05-20 ENCOUNTER — Ambulatory Visit
Admission: RE | Admit: 2017-05-20 | Discharge: 2017-05-20 | Disposition: A | Discharge status: Home / Self Care | Payer: Medicare HMO | Source: Ambulatory Visit | Attending: Family Medicine | Admitting: Family Medicine

## 2017-05-20 DIAGNOSIS — Z1231 Encounter for screening mammogram for malignant neoplasm of breast: Secondary | ICD-10-CM | POA: Diagnosis not present

## 2017-05-24 ENCOUNTER — Other Ambulatory Visit: Payer: Self-pay | Admitting: Family Medicine

## 2017-05-24 DIAGNOSIS — N6489 Other specified disorders of breast: Secondary | ICD-10-CM

## 2017-05-24 DIAGNOSIS — R928 Other abnormal and inconclusive findings on diagnostic imaging of breast: Secondary | ICD-10-CM

## 2017-06-02 ENCOUNTER — Ambulatory Visit
Admission: RE | Admit: 2017-06-02 | Discharge: 2017-06-02 | Disposition: A | Discharge status: Home / Self Care | Payer: Medicare HMO | Source: Ambulatory Visit | Attending: Family Medicine | Admitting: Family Medicine

## 2017-06-02 DIAGNOSIS — R928 Other abnormal and inconclusive findings on diagnostic imaging of breast: Secondary | ICD-10-CM

## 2017-06-02 DIAGNOSIS — N6489 Other specified disorders of breast: Secondary | ICD-10-CM | POA: Insufficient documentation

## 2017-06-04 ENCOUNTER — Encounter: Payer: Self-pay | Admitting: *Deleted

## 2017-06-28 ENCOUNTER — Encounter: Payer: Self-pay | Admitting: Family Medicine

## 2017-06-29 DIAGNOSIS — G4733 Obstructive sleep apnea (adult) (pediatric): Secondary | ICD-10-CM | POA: Diagnosis not present

## 2017-07-06 ENCOUNTER — Inpatient Hospital Stay: Payer: Medicare HMO | Attending: Oncology

## 2017-07-06 DIAGNOSIS — C9190 Lymphoid leukemia, unspecified not having achieved remission: Secondary | ICD-10-CM | POA: Insufficient documentation

## 2017-07-06 DIAGNOSIS — C9110 Chronic lymphocytic leukemia of B-cell type not having achieved remission: Secondary | ICD-10-CM

## 2017-07-06 LAB — CBC WITH DIFFERENTIAL/PLATELET
Basophils Absolute: 0.1 10*3/uL (ref 0–0.1)
Basophils Relative: 1 %
EOS ABS: 0.3 10*3/uL (ref 0–0.7)
Eosinophils Relative: 2 %
HCT: 44.6 % (ref 35.0–47.0)
HEMOGLOBIN: 15.2 g/dL (ref 12.0–16.0)
LYMPHS ABS: 10.7 10*3/uL — AB (ref 1.0–3.6)
Lymphocytes Relative: 62 %
MCH: 31.1 pg (ref 26.0–34.0)
MCHC: 34.2 g/dL (ref 32.0–36.0)
MCV: 91.1 fL (ref 80.0–100.0)
MONOS PCT: 5 %
Monocytes Absolute: 0.8 10*3/uL (ref 0.2–0.9)
NEUTROS PCT: 30 %
Neutro Abs: 5.2 10*3/uL (ref 1.4–6.5)
Platelets: 202 10*3/uL (ref 150–440)
RBC: 4.9 MIL/uL (ref 3.80–5.20)
RDW: 14.2 % (ref 11.5–14.5)
WBC: 17.1 10*3/uL — ABNORMAL HIGH (ref 3.6–11.0)

## 2017-07-12 ENCOUNTER — Other Ambulatory Visit: Payer: Self-pay | Admitting: Family Medicine

## 2017-07-12 DIAGNOSIS — E559 Vitamin D deficiency, unspecified: Secondary | ICD-10-CM

## 2017-07-12 DIAGNOSIS — C9110 Chronic lymphocytic leukemia of B-cell type not having achieved remission: Secondary | ICD-10-CM

## 2017-07-12 DIAGNOSIS — E785 Hyperlipidemia, unspecified: Secondary | ICD-10-CM

## 2017-07-12 DIAGNOSIS — E538 Deficiency of other specified B group vitamins: Secondary | ICD-10-CM

## 2017-07-13 ENCOUNTER — Ambulatory Visit (INDEPENDENT_AMBULATORY_CARE_PROVIDER_SITE_OTHER): Payer: Medicare HMO | Admitting: Family Medicine

## 2017-07-13 ENCOUNTER — Encounter: Payer: Self-pay | Admitting: Family Medicine

## 2017-07-13 ENCOUNTER — Ambulatory Visit (INDEPENDENT_AMBULATORY_CARE_PROVIDER_SITE_OTHER): Payer: Medicare HMO

## 2017-07-13 VITALS — BP 110/84 | HR 64 | Temp 97.6°F | Ht 65.5 in | Wt 207.8 lb

## 2017-07-13 DIAGNOSIS — E669 Obesity, unspecified: Secondary | ICD-10-CM

## 2017-07-13 DIAGNOSIS — E785 Hyperlipidemia, unspecified: Secondary | ICD-10-CM

## 2017-07-13 DIAGNOSIS — Z0000 Encounter for general adult medical examination without abnormal findings: Secondary | ICD-10-CM | POA: Diagnosis not present

## 2017-07-13 DIAGNOSIS — R5383 Other fatigue: Secondary | ICD-10-CM | POA: Diagnosis not present

## 2017-07-13 DIAGNOSIS — E559 Vitamin D deficiency, unspecified: Secondary | ICD-10-CM

## 2017-07-13 DIAGNOSIS — E538 Deficiency of other specified B group vitamins: Secondary | ICD-10-CM

## 2017-07-13 DIAGNOSIS — M8580 Other specified disorders of bone density and structure, unspecified site: Secondary | ICD-10-CM

## 2017-07-13 DIAGNOSIS — Z7189 Other specified counseling: Secondary | ICD-10-CM

## 2017-07-13 DIAGNOSIS — E66811 Obesity, class 1: Secondary | ICD-10-CM

## 2017-07-13 DIAGNOSIS — C9110 Chronic lymphocytic leukemia of B-cell type not having achieved remission: Secondary | ICD-10-CM

## 2017-07-13 DIAGNOSIS — C9190 Lymphoid leukemia, unspecified not having achieved remission: Secondary | ICD-10-CM

## 2017-07-13 LAB — BASIC METABOLIC PANEL
BUN: 13 mg/dL (ref 6–23)
CHLORIDE: 107 meq/L (ref 96–112)
CO2: 24 meq/L (ref 19–32)
CREATININE: 0.94 mg/dL (ref 0.40–1.20)
Calcium: 9.2 mg/dL (ref 8.4–10.5)
GFR: 62.41 mL/min (ref >60.00)
GLUCOSE: 96 mg/dL (ref 70–99)
Potassium: 3.9 mEq/L (ref 3.5–5.1)
Sodium: 141 mEq/L (ref 135–145)

## 2017-07-13 LAB — LDL CHOLESTEROL, DIRECT: LDL DIRECT: 141 mg/dL

## 2017-07-13 LAB — LIPID PANEL
CHOL/HDL RATIO: 5
Cholesterol: 223 mg/dL — ABNORMAL HIGH (ref 0–200)
HDL: 46.6 mg/dL (ref >39.00)
NonHDL: 176.52
Triglycerides: 230 mg/dL — ABNORMAL HIGH (ref 0.0–149.0)
VLDL: 46 mg/dL — ABNORMAL HIGH (ref 0.0–40.0)

## 2017-07-13 LAB — VITAMIN B12: Vitamin B-12: 911 pg/mL (ref 211–911)

## 2017-07-13 LAB — VITAMIN D 25 HYDROXY (VIT D DEFICIENCY, FRACTURES): VITD: 23.82 ng/mL — ABNORMAL LOW (ref 30.00–100.00)

## 2017-07-13 NOTE — Assessment & Plan Note (Addendum)
Very stable period. Appreciate onc care. Continue f/u with them.

## 2017-07-13 NOTE — Assessment & Plan Note (Signed)
Pending level

## 2017-07-13 NOTE — Assessment & Plan Note (Signed)
Resolved with B12 replacement.

## 2017-07-13 NOTE — Assessment & Plan Note (Signed)
Pending b12 level on oral replacement.

## 2017-07-13 NOTE — Assessment & Plan Note (Signed)
Reviewed calcium/vit D dosing, encouraged regular weight bearing exercise.  Continue vit D 2000 IU Update DEXA at next mammogram.

## 2017-07-13 NOTE — Patient Instructions (Addendum)
Remind [name] to schedule bone density scan 2020 at same time as mammogram.  If interested, check with pharmacy about new 2 shot shingles series (shingrix). You are doing well today Continue good calcium in the diet, regular weight bearing exercise to keep bones strong We will be in touch with results.  Health Maintenance, Female Adopting a healthy lifestyle and getting preventive care can go a long way to promote health and wellness. Talk with your health care provider about what schedule of regular examinations is right for you. This is a good chance for you to check in with your provider about disease prevention and staying healthy. In between checkups, there are plenty of things you can do on your own. Experts have done a lot of research about which lifestyle changes and preventive measures are most likely to keep you healthy. Ask your health care provider for more information. Weight and diet Eat a healthy diet  Be sure to include plenty of vegetables, fruits, low-fat dairy products, and lean protein.  Do not eat a lot of foods high in solid fats, added sugars, or salt.  Get regular exercise. This is one of the most important things you can do for your health. ? Most adults should exercise for at least 150 minutes each week. The exercise should increase your heart rate and make you sweat (moderate-intensity exercise). ? Most adults should also do strengthening exercises at least twice a week. This is in addition to the moderate-intensity exercise.  Maintain a healthy weight  Body mass index (BMI) is a measurement that can be used to identify possible weight problems. It estimates body fat based on height and weight. Your health care provider can help determine your BMI and help you achieve or maintain a healthy weight.  For females 38 years of age and older: ? A BMI below 18.5 is considered underweight. ? A BMI of 18.5 to 24.9 is normal. ? A BMI of 25 to 29.9 is considered  overweight. ? A BMI of 30 and above is considered obese.  Watch levels of cholesterol and blood lipids  You should start having your blood tested for lipids and cholesterol at 71 years of age, then have this test every 5 years.  You may need to have your cholesterol levels checked more often if: ? Your lipid or cholesterol levels are high. ? You are older than 71 years of age. ? You are at high risk for heart disease.  Cancer screening Lung Cancer  Lung cancer screening is recommended for adults 27-3 years old who are at high risk for lung cancer because of a history of smoking.  A yearly low-dose CT scan of the lungs is recommended for people who: ? Currently smoke. ? Have quit within the past 15 years. ? Have at least a 30-pack-year history of smoking. A pack year is smoking an average of one pack of cigarettes a day for 1 year.  Yearly screening should continue until it has been 15 years since you quit.  Yearly screening should stop if you develop a health problem that would prevent you from having lung cancer treatment.  Breast Cancer  Practice breast self-awareness. This means understanding how your breasts normally appear and feel.  It also means doing regular breast self-exams. Let your health care provider know about any changes, no matter how small.  If you are in your 20s or 30s, you should have a clinical breast exam (CBE) by a health care provider every 1-3 years as  part of a regular health exam.  If you are 40 or older, have a CBE every year. Also consider having a breast X-ray (mammogram) every year.  If you have a family history of breast cancer, talk to your health care provider about genetic screening.  If you are at high risk for breast cancer, talk to your health care provider about having an MRI and a mammogram every year.  Breast cancer gene (BRCA) assessment is recommended for women who have family members with BRCA-related cancers. BRCA-related cancers  include: ? Breast. ? Ovarian. ? Tubal. ? Peritoneal cancers.  Results of the assessment will determine the need for genetic counseling and BRCA1 and BRCA2 testing.  Cervical Cancer Your health care provider may recommend that you be screened regularly for cancer of the pelvic organs (ovaries, uterus, and vagina). This screening involves a pelvic examination, including checking for microscopic changes to the surface of your cervix (Pap test). You may be encouraged to have this screening done every 3 years, beginning at age 66.  For women ages 23-65, health care providers may recommend pelvic exams and Pap testing every 3 years, or they may recommend the Pap and pelvic exam, combined with testing for human papilloma virus (HPV), every 5 years. Some types of HPV increase your risk of cervical cancer. Testing for HPV may also be done on women of any age with unclear Pap test results.  Other health care providers may not recommend any screening for nonpregnant women who are considered low risk for pelvic cancer and who do not have symptoms. Ask your health care provider if a screening pelvic exam is right for you.  If you have had past treatment for cervical cancer or a condition that could lead to cancer, you need Pap tests and screening for cancer for at least 20 years after your treatment. If Pap tests have been discontinued, your risk factors (such as having a new sexual partner) need to be reassessed to determine if screening should resume. Some women have medical problems that increase the chance of getting cervical cancer. In these cases, your health care provider may recommend more frequent screening and Pap tests.  Colorectal Cancer  This type of cancer can be detected and often prevented.  Routine colorectal cancer screening usually begins at 71 years of age and continues through 71 years of age.  Your health care provider may recommend screening at an earlier age if you have risk factors  for colon cancer.  Your health care provider may also recommend using home test kits to check for hidden blood in the stool.  A small camera at the end of a tube can be used to examine your colon directly (sigmoidoscopy or colonoscopy). This is done to check for the earliest forms of colorectal cancer.  Routine screening usually begins at age 84.  Direct examination of the colon should be repeated every 5-10 years through 71 years of age. However, you may need to be screened more often if early forms of precancerous polyps or small growths are found.  Skin Cancer  Check your skin from head to toe regularly.  Tell your health care provider about any new moles or changes in moles, especially if there is a change in a mole's shape or color.  Also tell your health care provider if you have a mole that is larger than the size of a pencil eraser.  Always use sunscreen. Apply sunscreen liberally and repeatedly throughout the day.  Protect yourself by wearing  long sleeves, pants, a wide-brimmed hat, and sunglasses whenever you are outside.  Heart disease, diabetes, and high blood pressure  High blood pressure causes heart disease and increases the risk of stroke. High blood pressure is more likely to develop in: ? People who have blood pressure in the high end of the normal range (130-139/85-89 mm Hg). ? People who are overweight or obese. ? People who are African American.  If you are 84-53 years of age, have your blood pressure checked every 3-5 years. If you are 36 years of age or older, have your blood pressure checked every year. You should have your blood pressure measured twice-once when you are at a hospital or clinic, and once when you are not at a hospital or clinic. Record the average of the two measurements. To check your blood pressure when you are not at a hospital or clinic, you can use: ? An automated blood pressure machine at a pharmacy. ? A home blood pressure monitor.  If  you are between 81 years and 46 years old, ask your health care provider if you should take aspirin to prevent strokes.  Have regular diabetes screenings. This involves taking a blood sample to check your fasting blood sugar level. ? If you are at a normal weight and have a low risk for diabetes, have this test once every three years after 71 years of age. ? If you are overweight and have a high risk for diabetes, consider being tested at a younger age or more often. Preventing infection Hepatitis B  If you have a higher risk for hepatitis B, you should be screened for this virus. You are considered at high risk for hepatitis B if: ? You were born in a country where hepatitis B is common. Ask your health care provider which countries are considered high risk. ? Your parents were born in a high-risk country, and you have not been immunized against hepatitis B (hepatitis B vaccine). ? You have HIV or AIDS. ? You use needles to inject street drugs. ? You live with someone who has hepatitis B. ? You have had sex with someone who has hepatitis B. ? You get hemodialysis treatment. ? You take certain medicines for conditions, including cancer, organ transplantation, and autoimmune conditions.  Hepatitis C  Blood testing is recommended for: ? Everyone born from [birth year] through 1964. ? Anyone with known risk factors for hepatitis C.  Sexually transmitted infections (STIs)  You should be screened for sexually transmitted infections (STIs) including gonorrhea and chlamydia if: ? You are sexually active and are younger than 71 years of age. ? You are older than 71 years of age and your health care provider tells you that you are at risk for this type of infection. ? Your sexual activity has changed since you were last screened and you are at an increased risk for chlamydia or gonorrhea. Ask your health care provider if you are at risk.  If you do not have HIV, but are at risk, it may be recommended  that you take a prescription medicine daily to prevent HIV infection. This is called pre-exposure prophylaxis (PrEP). You are considered at risk if: ? You are sexually active and do not regularly use condoms or know the HIV status of your partner(s). ? You take drugs by injection. ? You are sexually active with a partner who has HIV.  Talk with your health care provider about whether you are at high risk of being infected with HIV.  If you choose to begin PrEP, you should first be tested for HIV. You should then be tested every 3 months for as long as you are taking PrEP. Pregnancy  If you are premenopausal and you may become pregnant, ask your health care provider about preconception counseling.  If you may become pregnant, take 400 to 800 micrograms (mcg) of folic acid every day.  If you want to prevent pregnancy, talk to your health care provider about birth control (contraception). Osteoporosis and menopause  Osteoporosis is a disease in which the bones lose minerals and strength with aging. This can result in serious bone fractures. Your risk for osteoporosis can be identified using a bone density scan.  If you are 8 years of age or older, or if you are at risk for osteoporosis and fractures, ask your health care provider if you should be screened.  Ask your health care provider whether you should take a calcium or vitamin D supplement to lower your risk for osteoporosis.  Menopause may have certain physical symptoms and risks.  Hormone replacement therapy may reduce some of these symptoms and risks. Talk to your health care provider about whether hormone replacement therapy is right for you. Follow these instructions at home:  Schedule regular health, dental, and eye exams.  Stay current with your immunizations.  Do not use any tobacco products including cigarettes, chewing tobacco, or electronic cigarettes.  If you are pregnant, do not drink alcohol.  If you are  breastfeeding, limit how much and how often you drink alcohol.  Limit alcohol intake to no more than 1 drink per day for nonpregnant women. One drink equals 12 ounces of beer, 5 ounces of wine, or 1 ounces of hard liquor.  Do not use street drugs.  Do not share needles.  Ask your health care provider for help if you need support or information about quitting drugs.  Tell your health care provider if you often feel depressed.  Tell your health care provider if you have ever been abused or do not feel safe at home. This information is not intended to replace advice given to you by your health care provider. Make sure you discuss any questions you have with your health care provider. Document Released: 07/28/2010 Document Revised: 06/20/2015 Document Reviewed: 10/16/2014 Elsevier Interactive Patient Education  [employer].

## 2017-07-13 NOTE — Progress Notes (Signed)
PCP notes:   Health maintenance:  Tetanus vaccine - postponed/insurance  Abnormal screenings:   None  Patient concerns:   None  Nurse concerns:  None  Next PCP appt:   07/13/17 @ 1500

## 2017-07-13 NOTE — Assessment & Plan Note (Signed)
Pending FLP. Off medication.

## 2017-07-13 NOTE — Patient Instructions (Signed)
Lori Shaw , Thank you for taking time to come for your Medicare Wellness Visit. I appreciate your ongoing commitment to your health goals. Please review the following plan we discussed and let me know if I can assist you in the future.   These are the goals we discussed: Goals    . DIET - INCREASE WATER INTAKE     Starting 07/13/2017, I will attempt to drink 6-8 glasses of water daily.        This is a list of the screening recommended for you and due dates:  Health Maintenance  Topic Date Due  . DTaP/Tdap/Td vaccine (2 - Td) 07/14/2018*  . Tetanus Vaccine  07/14/2018*  . Flu Shot  08/26/2017  . Colon Cancer Screening  01/26/2018  . Mammogram  05/21/2019  . DEXA scan (bone density measurement)  Completed  .  Hepatitis C: One time screening is recommended by Center for Disease Control  (CDC) for  adults born from [birth year] through 1964.   Completed  . Pneumonia vaccines  Completed  *Topic was postponed. The date shown is not the original due date.   Preventive Care for Adults  A healthy lifestyle and preventive care can promote health and wellness. Preventive health guidelines for adults include the following key practices.  . A routine yearly physical is a good way to check with your health care provider about your health and preventive screening. It is a chance to share any concerns and updates on your health and to receive a thorough exam.  . Visit your dentist for a routine exam and preventive care every 6 months. Brush your teeth twice a day and floss once a day. Good oral hygiene prevents tooth decay and gum disease.  . The frequency of eye exams is based on your age, health, family medical history, use  of contact lenses, and other factors. Follow your health care provider's recommendations for frequency of eye exams.  . Eat a healthy diet. Foods like vegetables, fruits, whole grains, low-fat dairy products, and lean protein foods contain the nutrients you need without too many  calories. Decrease your intake of foods high in solid fats, added sugars, and salt. Eat the right amount of calories for you. Get information about a proper diet from your health care provider, if necessary.  . Regular physical exercise is one of the most important things you can do for your health. Most adults should get at least 150 minutes of moderate-intensity exercise (any activity that increases your heart rate and causes you to sweat) each week. In addition, most adults need muscle-strengthening exercises on 2 or more days a week.  Silver Sneakers may be a benefit available to you. To determine eligibility, you may visit the website: www.silversneakers.com or contact program at (314) 847-9982 Mon-Fri between 8AM-8PM.   . Maintain a healthy weight. The body mass index (BMI) is a screening tool to identify possible weight problems. It provides an estimate of body fat based on height and weight. Your health care provider can find your BMI and can help you achieve or maintain a healthy weight.   For adults 20 years and older: ? A BMI below 18.5 is considered underweight. ? A BMI of 18.5 to 24.9 is normal. ? A BMI of 25 to 29.9 is considered overweight. ? A BMI of 30 and above is considered obese.   . Maintain normal blood lipids and cholesterol levels by exercising and minimizing your intake of saturated fat. Eat a balanced diet with plenty  of fruit and vegetables. Blood tests for lipids and cholesterol should begin at age 90 and be repeated every 5 years. If your lipid or cholesterol levels are high, you are over 50, or you are at high risk for heart disease, you may need your cholesterol levels checked more frequently. Ongoing high lipid and cholesterol levels should be treated with medicines if diet and exercise are not working.  . If you smoke, find out from your health care provider how to quit. If you do not use tobacco, please do not start.  . If you choose to drink alcohol, please do  not consume more than 2 drinks per day. One drink is considered to be 12 ounces (355 mL) of beer, 5 ounces (148 mL) of wine, or 1.5 ounces (44 mL) of liquor.  . If you are 9-35 years old, ask your health care provider if you should take aspirin to prevent strokes.  . Use sunscreen. Apply sunscreen liberally and repeatedly throughout the day. You should seek shade when your shadow is shorter than you. Protect yourself by wearing long sleeves, pants, a wide-brimmed hat, and sunglasses year round, whenever you are outdoors.  . Once a month, do a whole body skin exam, using a mirror to look at the skin on your back. Tell your health care provider of new moles, moles that have irregular borders, moles that are larger than a pencil eraser, or moles that have changed in shape or color.

## 2017-07-13 NOTE — Progress Notes (Signed)
Subjective:   Lori Shaw is a 72 y.o. female who presents for Medicare Annual (Subsequent) preventive examination.  Review of Systems:  N/A Cardiac Risk Factors include: advanced age (>6men, >59 women);dyslipidemia;obesity (BMI >30kg/m2)     Objective:     Vitals: BP 110/84 (BP Location: Right Arm, Patient Position: Sitting, Cuff Size: Normal)   Pulse 64   Temp 97.6 F (36.4 C) (Oral)   Ht 5' 5.5" (1.664 m) Comment: shoes  Wt 207 lb 12 oz (94.2 kg)   SpO2 97%   BMI 34.05 kg/m   Body mass index is 34.05 kg/m.  Advanced Directives 07/13/2017 07/17/2016 06/06/2015  Does Patient Have a Medical Advance Directive? Yes Yes No  Type of Paramedic of [place];Living will [place];Living will -  Does patient want to make changes to medical advance directive? - No - Patient declined -  Copy of [place] in Chart? Yes Yes -  Would patient like information on creating a medical advance directive? - No - Patient declined Yes - Educational materials given    Tobacco Social History   Tobacco Use  Smoking Status Former Smoker  . Packs/day: 2.00  . Years: 20.00  . Pack years: 40.00  . Types: Cigarettes  . Last attempt to quit: 01/26/1978  . Years since quitting: 39.4  Smokeless Tobacco Never Used     Counseling given: No   Clinical Intake:  Pre-visit preparation completed: Yes  Pain : No/denies pain Pain Score: 0-No pain     Nutritional Status: BMI > 30  Obese Nutritional Risks: None Diabetes: No  How often do you need to have someone help you when you read instructions, pamphlets, or other written materials from your doctor or pharmacy?: 1 - Never What is the last grade level you completed in school?: 12th grade + 2 yrs college  Interpreter Needed?: No  Comments: pt lives with spouse Information entered by :: LPinson, LPN  Past Medical History:  Diagnosis Date  . CLL (chronic lymphocytic  leukemia) ([place]) 06/21/2016   Rai stage 0, dx 06/2016 (Dr Janese Banks)  . HLD (hyperlipidemia)   . OSA on CPAP 2006, 04/2014   AHI of 23.8 and an RDI of 42.9, on CPAP 9 cm H2O ([facility])  . Osteopenia 08/2010   T -1.4 at L femur neck   Past Surgical History:  Procedure Laterality Date  . ABDOMINAL HYSTERECTOMY  01/27/1983   for menometrorrhagia, one ovary remained  . COLONOSCOPY  2012   no records available. per patient normal.   . dexa  08/2010   osteopenia  . TONSILLECTOMY  01/26/1973   Family History  Problem Relation Age of Onset  . Hyperlipidemia Mother   . CAD Mother        CABG  . Hyperlipidemia Father   . CAD Father        MI  . Arthritis Maternal Grandmother   . Cancer Sister        breast  . Breast cancer Sister 8  . Alzheimer's disease Unknown   . Heart attack Unknown   . Diabetes Neg Hx   . Stroke Neg Hx    Social History   Socioeconomic History  . Marital status: Married    Spouse name: Not on file  . Number of children: 2  . Years of education: 1.5  . Highest education level: Not on file  Occupational History  . Occupation: retired  Scientific laboratory technician  . Emergency planning/management officer  strain: Not on file  . Food insecurity:    Worry: Not on file    Inability: Not on file  . Transportation needs:    Medical: Not on file    Non-medical: Not on file  Tobacco Use  . Smoking status: Former Smoker    Packs/day: 2.00    Years: 20.00    Pack years: 40.00    Types: Cigarettes    Last attempt to quit: 01/26/1978    Years since quitting: 39.4  . Smokeless tobacco: Never Used  Substance and Sexual Activity  . Alcohol use: No  . Drug use: No  . Sexual activity: Yes  Lifestyle  . Physical activity:    Days per week: Not on file    Minutes per session: Not on file  . Stress: Not on file  Relationships  . Social connections:    Talks on phone: Not on file    Gets together: Not on file    Attends religious service: Not on file    Active member of club or organization: Not on  file    Attends meetings of clubs or organizations: Not on file    Relationship status: Not on file  Other Topics Concern  . Not on file  Social History Narrative   Lives with daughter, 1 dog   Occupation: retired, has worked in Chief Strategy Officer, also Forensic psychologist   Edu: 1.5 yrs college   Activity: square dancing   Diet: good water, fruits/vegetables daily   Patient drinks two cups of caffeine daily.   Remarried.      Outpatient Encounter Medications as of 07/13/2017  Medication Sig  . Cholecalciferol (VITAMIN D) 2000 units CAPS Take 1 capsule by mouth daily.  . Cyanocobalamin (VITAMIN B-12) 2500 MCG SUBL Place 1 tablet (2,500 mcg total) under the tongue daily.  . fluticasone (FLONASE) 50 MCG/ACT nasal spray Place 2 sprays into both nostrils daily as needed.  . cyanocobalamin (,VITAMIN B-12,) 1000 MCG/ML injection Inject 1 mL (1,000 mcg total) into the muscle every 14 (fourteen) days.   No facility-administered encounter medications on file as of 07/13/2017.     Activities of Daily Living In your present state of health, do you have any difficulty performing the following activities: 07/13/2017  Hearing? N  Vision? N  Difficulty concentrating or making decisions? N  Walking or climbing stairs? Y  Comment knee pain  Dressing or bathing? N  Doing errands, shopping? N  Preparing Food and eating ? N  Using the Toilet? N  In the past six months, have you accidently leaked urine? N  Do you have problems with loss of bowel control? N  Managing your Medications? N  Managing your Finances? N  Housekeeping or managing your Housekeeping? N  Some recent data might be hidden    Patient Care Team: Ria Bush, MD as PCP - General (Family Medicine) Solon Augusta, MD as Attending Physician (Ophthalmology) Elbert Ewings, DDS as Consulting Physician (Dentistry)    Assessment:   This is a routine wellness examination for [name].   Hearing Screening   125Hz  250Hz  500Hz   1000Hz  2000Hz  3000Hz  4000Hz  6000Hz  8000Hz   Right ear:   40 40 40  40    Left ear:   40 40 40  40    Vision Screening Comments: Vision exam on 05/10/17 with Dr. Jola Schmidt   Exercise Activities and Dietary recommendations Current Exercise Habits: The patient does not participate in regular exercise at present, Exercise limited by: orthopedic  condition(s)  Goals    . DIET - INCREASE WATER INTAKE     Starting 07/13/2017, I will attempt to drink 6-8 glasses of water daily.        Fall Risk Fall Risk  07/13/2017 06/30/2016 06/06/2015 05/04/2013  Falls in the past year? No No No No   Depression Screen PHQ 2/9 Scores 07/13/2017 06/30/2016 06/06/2015 05/04/2013  PHQ - 2 Score 0 0 0 0  PHQ- 9 Score 0 - - -     Cognitive Function MMSE - Mini Mental State Exam 07/13/2017 06/06/2015  Orientation to time 5 5  Orientation to Place 5 5  Registration 3 3  Attention/ Calculation 0 0  Recall 3 3  Language- name 2 objects 0 0  Language- repeat 1 1  Language- follow 3 step command 3 3  Language- read & follow direction 0 0  Write a sentence 0 0  Copy design 0 0  Total score 20 20     PLEASE NOTE: A Mini-Cog screen was completed. Maximum score is 20. A value of 0 denotes this part of [place] MMSE was not completed or the patient failed this part of the Mini-Cog screening.   Mini-Cog Screening Orientation to Time - Max 5 pts Orientation to Place - Max 5 pts Registration - Max 3 pts Recall - Max 3 pts Language Repeat - Max 1 pts Language Follow 3 Step Command - Max 3 pts     Immunization History  Administered Date(s) Administered  . Hepatitis A 05/31/1998, 02/19/2006  . Influenza Whole 10/26/2012  . Influenza,inj,Quad PF,6+ Mos 12/14/2014, 11/10/2016  . Influenza-Unspecified 11/04/2013  . Pneumococcal Conjugate-13 06/06/2015  . Pneumococcal Polysaccharide-23 10/26/2012  . Td 01/26/1998  . Tdap 04/01/2006  . Zoster 01/27/2011    Screening Tests Health Maintenance  Topic Date Due  .  DTaP/Tdap/Td (2 - Td) 07/14/2018 (Originally 03/31/2016)  . TETANUS/TDAP  07/14/2018 (Originally 03/31/2016)  . INFLUENZA VACCINE  08/26/2017  . COLONOSCOPY  01/26/2018  . MAMMOGRAM  05/21/2019  . DEXA SCAN  Completed  . Hepatitis C Screening  Completed  . PNA vac Low Risk Adult  Completed       Plan:     I have personally reviewed, addressed, and noted the following in the patient's chart:  A. Medical and social history B. Use of alcohol, tobacco or illicit drugs  C. Current medications and supplements D. Functional ability and status E.  Nutritional status F.  Physical activity G. Advance directives H. List of other physicians I.  Hospitalizations, surgeries, and ER visits in previous 12 months J.  [place] to include hearing, vision, cognitive, depression L. Referrals and appointments - none  In addition, I have reviewed and discussed with patient certain preventive protocols, quality metrics, and best practice recommendations. A written personalized care plan for preventive services as well as general preventive health recommendations were provided to patient.  See attached scanned questionnaire for additional information.   Signed,   Lindell Noe, MHA, BS, LPN Health Coach

## 2017-07-13 NOTE — Progress Notes (Signed)
BP 110/84 (BP Location: Right Arm, Patient Position: Sitting, Cuff Size: Normal)   Pulse 64   Temp 97.6 F (36.4 C) (Oral)   Ht 5' 5.5" (1.664 m) Comment: shoes  Wt 207 lb 12 oz (94.2 kg)   SpO2 97%   BMI 34.05 kg/m    CC: CPE Subjective:    Patient ID: Lori Shaw, female    DOB: [birth year], 72 y.o.   MRN: [id number]  HPI: Lori Shaw is a 72 y.o. female presenting on 07/13/2017 for Annual Exam (Part 2)   Saw Katha Cabal today for medicare wellness visit. Note will be reviewed.    Known CLL, B12 deficiency on SL 2552mcg daily. Last B12 shot was 02/2017.   Preventative: Colon cancer screening - ~2010 per pt normal, rec rpt 10 yrs - done in [place] - no records available.  Well woman exam - s/p hysterectomy 1985 with 1 ovary remaining.No longer receives pap smears. Mammogram - 04/2017 - with detailed mammo R without concerns. She does breast exams at home.  Dexa Date: 08/2010 osteopenia. Drinks milk and eats yogurt daily.H/o low vit D in past. She does get weight bearing exercise. Recommend DEXA 2020.  Flu shot yearly Pneumovax 10/2012 , prevnar 2017 Tdap 03/2006 zostavax 2013  Shingrix - discussed [place] advanced directive and scanned 07/2016 - HCPOA is husband Energy manager then [employer]. Does not want prolonged life support if incurable condition.  Seat belt use discussed Sunscreen use discussed. No changing moles on skin. Ex-smoker quit 1980. Daughter smokes at home.  Alcohol - a few glasses of wine a week Dentist Q6 mo  Eye exam yearly   Lives with daughter, 1 dog  Occupation: retired, has worked in Chief Strategy Officer, also Forensic psychologist  Edu: 1.5 yrs college  Activity: stays active in yard Diet: good water, fruits/vegetables daily. Working on [employer].   Relevant past medical, surgical, family and social history reviewed and updated as indicated. Interim medical history since our last visit reviewed. Allergies and medications  reviewed and updated. Outpatient Medications Prior to Visit  Medication Sig Dispense Refill  . Cholecalciferol (VITAMIN D) 2000 units CAPS Take 1 capsule by mouth daily.    . Cyanocobalamin (VITAMIN B-12) 2500 MCG SUBL Place 1 tablet (2,500 mcg total) under the tongue daily.  0  . fluticasone (FLONASE) 50 MCG/ACT nasal spray Place 2 sprays into both nostrils daily as needed. 16 g 11  . cyanocobalamin (,VITAMIN B-12,) 1000 MCG/ML injection Inject 1 mL (1,000 mcg total) into the muscle every 14 (fourteen) days.     No facility-administered medications prior to visit.      Per HPI unless specifically indicated in ROS section below Review of Systems  Constitutional: Negative for activity change, appetite change, chills, fatigue, fever and unexpected weight change.  HENT: Negative for hearing loss.   Eyes: Negative for visual disturbance.  Respiratory: Negative for cough, chest tightness, shortness of breath and wheezing.   Cardiovascular: Positive for leg swelling (occasional). Negative for chest pain and palpitations.  Gastrointestinal: Negative for abdominal distention, abdominal pain, blood in stool, constipation, diarrhea, nausea and vomiting.  Genitourinary: Negative for difficulty urinating and hematuria.  Musculoskeletal: Negative for arthralgias, myalgias and neck pain.  Skin: Negative for rash.  Neurological: Negative for dizziness, seizures, syncope and headaches.  Hematological: Negative for adenopathy. Does not bruise/bleed easily.  Psychiatric/Behavioral: Negative for dysphoric mood. The patient is not nervous/anxious.        Objective:    BP 110/84 (BP  Location: Right Arm, Patient Position: Sitting, Cuff Size: Normal)   Pulse 64   Temp 97.6 F (36.4 C) (Oral)   Ht 5' 5.5" (1.664 m) Comment: shoes  Wt 207 lb 12 oz (94.2 kg)   SpO2 97%   BMI 34.05 kg/m   Wt Readings from Last 3 Encounters:  07/13/17 207 lb 12 oz (94.2 kg)  07/13/17 207 lb 12 oz (94.2 kg)  02/16/17  207 lb 8 oz (94.1 kg)    Physical Exam  Constitutional: She is oriented to person, place, and time. She appears well-developed and well-nourished. No distress.  HENT:  Head: Normocephalic and atraumatic.  Right Ear: Hearing, tympanic membrane, external ear and ear canal normal.  Left Ear: Hearing, tympanic membrane, external ear and ear canal normal.  Nose: Nose normal.  Mouth/Throat: Uvula is midline, oropharynx is clear and moist and mucous membranes are normal. No oropharyngeal exudate, posterior oropharyngeal edema or posterior oropharyngeal erythema.  Eyes: Pupils are equal, round, and reactive to light. Conjunctivae and EOM are normal. No scleral icterus.  Neck: Normal range of motion. Neck supple. Carotid bruit is not present. No thyromegaly present.  Cardiovascular: Normal rate, regular rhythm, normal heart sounds and intact distal pulses.  No murmur heard. Pulses:      Radial pulses are 2+ on the right side, and 2+ on the left side.  Pulmonary/Chest: Effort normal and breath sounds normal. No respiratory distress. She has no wheezes. She has no rales.  Abdominal: Soft. Bowel sounds are normal. She exhibits no distension and no mass. There is no tenderness. There is no rebound and no guarding.  Musculoskeletal: Normal range of motion. She exhibits no edema.  Lymphadenopathy:    She has no cervical adenopathy.  Neurological: She is alert and oriented to person, place, and time.  CN grossly intact, station and gait intact  Skin: Skin is warm and dry. No rash noted.  Psychiatric: She has a normal mood and affect. Her behavior is normal. Judgment and thought content normal.  Nursing note and vitals reviewed.  Results for orders placed or performed in visit on 07/06/17  CBC with Differential/Platelet  Result Value Ref Range   WBC 17.1 (H) 3.6 - 11.0 K/uL   RBC 4.90 3.80 - 5.20 MIL/uL   Hemoglobin 15.2 12.0 - 16.0 g/dL   HCT 44.6 35.0 - 47.0 %   MCV 91.1 80.0 - 100.0 fL   MCH  31.1 26.0 - 34.0 pg   MCHC 34.2 32.0 - 36.0 g/dL   RDW 14.2 11.5 - 14.5 %   Platelets 202 150 - 440 K/uL   Neutrophils Relative % 30 %   Neutro Abs 5.2 1.4 - 6.5 K/uL   Lymphocytes Relative 62 %   Lymphs Abs 10.7 (H) 1.0 - 3.6 K/uL   Monocytes Relative 5 %   Monocytes Absolute 0.8 0.2 - 0.9 K/uL   Eosinophils Relative 2 %   Eosinophils Absolute 0.3 0 - 0.7 K/uL   Basophils Relative 1 %   Basophils Absolute 0.1 0 - 0.1 K/uL   Lab Results  Component Value Date   TSH 3.46 06/19/2016       Assessment & Plan:   Problem List Items Addressed This Visit    Vitamin D deficiency    Pending level      Vitamin B12 deficiency    Pending b12 level on oral replacement.       Osteopenia    Reviewed calcium/vit D dosing, encouraged regular weight bearing exercise.  Continue vit D 2000 IU Update DEXA at next mammogram.       Obesity, Class I, BMI 30-34.9   HLD (hyperlipidemia)    Pending FLP. Off medication.      Health maintenance examination - Primary    Preventative protocols reviewed and updated unless pt declined. Discussed healthy diet and lifestyle.       RESOLVED: Fatigue    Resolved with B12 replacement.       CLL (chronic lymphocytic leukemia) ([place])    Very stable period. Appreciate onc care. Continue f/u with them.       Advanced care planning/counseling discussion    Brings advanced directive and scanned 07/2016 - HCPOA is husband Leonetta Mcgivern then [employer]. Does not want prolonged life support if incurable condition.           No orders of the defined types were placed in this encounter.  No orders of the defined types were placed in this encounter.   Follow up plan: No follow-ups on file.  Ria Bush, MD

## 2017-07-13 NOTE — ACP (Advance Care Planning) (Signed)
Brings advanced directive and scanned 07/2016 - HCPOA is husband Watson Kirstein then Glenna Sixkiller. Does not want prolonged life support if incurable condition.  

## 2017-07-13 NOTE — Assessment & Plan Note (Signed)
Preventative protocols reviewed and updated unless pt declined. Discussed healthy diet and lifestyle.  

## 2017-07-13 NOTE — Assessment & Plan Note (Signed)
Brings advanced directive and scanned 07/2016 - HCPOA is husband Manjit Bufano then [employer]. Does not want prolonged life support if incurable condition.

## 2017-07-15 ENCOUNTER — Encounter: Payer: Self-pay | Admitting: Oncology

## 2017-07-18 NOTE — Progress Notes (Signed)
I reviewed health advisor's note, was available for consultation, and agree with documentation and plan.  

## 2017-07-22 ENCOUNTER — Other Ambulatory Visit: Payer: Self-pay | Admitting: Family Medicine

## 2017-08-12 DIAGNOSIS — Z0100 Encounter for examination of eyes and vision without abnormal findings: Secondary | ICD-10-CM | POA: Diagnosis not present

## 2017-08-16 ENCOUNTER — Ambulatory Visit (INDEPENDENT_AMBULATORY_CARE_PROVIDER_SITE_OTHER): Payer: Medicare HMO | Admitting: Family Medicine

## 2017-08-16 ENCOUNTER — Encounter: Payer: Self-pay | Admitting: Family Medicine

## 2017-08-16 VITALS — BP 124/74 | HR 74 | Temp 99.1°F | Ht 65.5 in | Wt 205.2 lb

## 2017-08-16 DIAGNOSIS — R197 Diarrhea, unspecified: Secondary | ICD-10-CM | POA: Diagnosis not present

## 2017-08-16 DIAGNOSIS — J209 Acute bronchitis, unspecified: Secondary | ICD-10-CM

## 2017-08-16 MED ORDER — AZITHROMYCIN 250 MG PO TABS: ORAL_TABLET | ORAL | 0 refills | Status: DC

## 2017-08-16 NOTE — Progress Notes (Signed)
Dr. Frederico Hamman T. Kla Bily, MD, [facility] [place], [address] Phone: [phone] Fax: [phone]  08/16/2017  Patient: Lori Shaw, MRN: [id number], DOB: 1945/10/01, 72 y.o.  Primary Physician:  Ria Bush, MD   Chief Complaint  Patient presents with  . Cough    productive at times-yellow, headache, started about 5 days ago   Subjective:   [name] is a 72 y.o. very pleasant female patient who presents with the following:  Patient presents for presents evaluation of myalgias, nasal congestion, productive cough, rhinorrhea , sneezing and sore throat. Symptoms began > 1 week ago and are gradually worsening since that time.   Started with dry coughing, husband has been coughing for 5 days. Had some diarrhea this morning.   Risk Factors: former smoker, exposure to husband / sick  The patient denies significant nausea, vomitting, diarrhea, rash, diffuse arthralgia or myalgia. They also deny high fever.   Past Medical History, Surgical History, Social History, Family History, Problem List, Medications, and Allergies have been reviewed and updated if relevant.  Patient Active Problem List   Diagnosis Date Noted  . Vitamin D deficiency 06/30/2016  . CLL (chronic lymphocytic leukemia) ([place]) 06/21/2016  . Vitamin B12 deficiency 06/21/2016  . ETD (eustachian tube dysfunction) 04/27/2016  . Rash and nonspecific skin eruption 08/23/2015  . Health maintenance examination 06/20/2015  . Advanced care planning/counseling discussion 06/20/2015  . Hypersomnia, persistent 09/18/2014  . Obesity, Class I, BMI 30-34.9 09/18/2014  . Sleep related headaches 04/24/2014  . Medicare annual wellness visit, subsequent 05/04/2013  . Seasonal allergic rhinitis 01/17/2013  . HLD (hyperlipidemia)   . OSA on CPAP   . Osteopenia 08/27/2010    Past Medical History:  Diagnosis Date  . CLL (chronic lymphocytic  leukemia) ([place]) 06/21/2016   Rai stage 0, dx 06/2016 (Dr Janese Banks)  . HLD (hyperlipidemia)   . OSA on CPAP 2006, 04/2014   AHI of 23.8 and an RDI of 42.9, on CPAP 9 cm H2O ([facility])  . Osteopenia 08/2010   T -1.4 at L femur neck  . Piriformis syndrome 12/14/2014    Past Surgical History:  Procedure Laterality Date  . ABDOMINAL HYSTERECTOMY  01/27/1983   for menometrorrhagia, one ovary remained  . COLONOSCOPY  2012   no records available. per patient normal.   . dexa  08/2010   osteopenia  . TONSILLECTOMY  01/26/1973    Social History   Socioeconomic History  . Marital status: Married    Spouse name: Not on file  . Number of children: 2  . Years of education: 1.5  . Highest education level: Not on file  Occupational History  . Occupation: retired  Scientific laboratory technician  . Financial resource strain: Not on file  . Food insecurity:    Worry: Not on file    Inability: Not on file  . Transportation needs:    Medical: Not on file    Non-medical: Not on file  Tobacco Use  . Smoking status: Former Smoker    Packs/day: 2.00    Years: 20.00    Pack years: 40.00    Types: Cigarettes    Last attempt to quit: 01/26/1978    Years since quitting: 39.5  . Smokeless tobacco: Never Used  Substance and Sexual Activity  . Alcohol use: No  . Drug use: Yes    Comment: occassionally glass of wine  . Sexual activity: Yes  Lifestyle  . Physical activity:  Days per week: Not on file    Minutes per session: Not on file  . Stress: Not on file  Relationships  . Social connections:    Talks on phone: Not on file    Gets together: Not on file    Attends religious service: Not on file    Active member of club or organization: Not on file    Attends meetings of clubs or organizations: Not on file    Relationship status: Not on file  . Intimate partner violence:    Fear of current or ex partner: Not on file    Emotionally abused: Not on file    Physically abused: Not on file    Forced sexual  activity: Not on file  Other Topics Concern  . Not on file  Social History Narrative   Lives with daughter, 1 dog   Occupation: retired, has worked in Chief Strategy Officer, also Forensic psychologist   Edu: 1.5 yrs college   Activity: square dancing   Diet: good water, fruits/vegetables daily   Patient drinks two cups of caffeine daily.   Remarried.      Family History  Problem Relation Age of Onset  . Hyperlipidemia Mother   . CAD Mother        CABG  . Hyperlipidemia Father   . CAD Father        MI  . Arthritis Maternal Grandmother   . Cancer Sister        breast  . Breast cancer Sister 52  . Alzheimer's disease Unknown   . Heart attack Unknown   . Diabetes Neg Hx   . Stroke Neg Hx     Allergies  Allergen Reactions  . Sulfa Antibiotics     Medication list reviewed and updated in full in [place].  ROS: GEN: Acute illness details above GI: Tolerating PO intake GU: maintaining adequate hydration and urination Pulm: No SOB Interactive and getting along well at home.  Otherwise, ROS is as per the HPI.  Objective:   BP 124/74   Pulse 74   Temp 99.1 F (37.3 C) (Oral)   Ht 5' 5.5" (1.664 m)   Wt 205 lb 4 oz (93.1 kg)   SpO2 97%   BMI 33.64 kg/m    GEN: A and O x 3. WDWN. NAD.    ENT: Nose clear, ext NML.  No LAD.  No JVD.  TM's clear. Oropharynx clear.  PULM: Normal WOB, no distress. No crackles, wheezes, rhonchi. CV: RRR, no M/G/R, No rubs, No JVD.   EXT: warm and well-perfused, No c/c/e. PSYCH: Pleasant and conversant.   Laboratory and Imaging Data: No results found.  Assessment and Plan:   Acute bronchitis, unspecified organism  Diarrhea, unspecified type  At this point, we reviewed supportive care. Given the length of time and risk factors, treat with medications below. More likely viral - I think reasonable to give abx to hold  Medical decision making includes all plans, orders, medications, and patient instructions reviewed face to face.     Follow-up: No follow-ups on file.  Meds ordered this encounter  Medications  . azithromycin (ZITHROMAX) 250 MG tablet    Sig: 2 tabs po on day 1, then 1 tab po for 4 days    Dispense:  6 tablet    Refill:  0   Signed,  Laquonda Welby T. Frazier Balfour, MD   Patient's Medications  New Prescriptions   AZITHROMYCIN (ZITHROMAX) 250 MG TABLET    2  tabs po on day 1, then 1 tab po for 4 days  Previous Medications   CHOLECALCIFEROL (VITAMIN D) 2000 UNITS CAPS    Take 1 capsule by mouth daily.   CYANOCOBALAMIN (VITAMIN B-12) 2500 MCG SUBL    Place 1 tablet (2,500 mcg total) under the tongue daily.   FLUTICASONE (FLONASE) 50 MCG/ACT NASAL SPRAY    Place 2 sprays into both nostrils daily as needed.  Modified Medications   No medications on file  Discontinued Medications   No medications on file

## 2017-09-07 DIAGNOSIS — R69 Illness, unspecified: Secondary | ICD-10-CM | POA: Diagnosis not present

## 2017-09-20 ENCOUNTER — Encounter: Payer: Self-pay | Admitting: Family Medicine

## 2017-09-21 ENCOUNTER — Ambulatory Visit (INDEPENDENT_AMBULATORY_CARE_PROVIDER_SITE_OTHER): Payer: Medicare HMO | Admitting: Family Medicine

## 2017-09-21 ENCOUNTER — Ambulatory Visit: Payer: Medicare HMO | Admitting: Neurology

## 2017-09-21 ENCOUNTER — Encounter: Payer: Self-pay | Admitting: Family Medicine

## 2017-09-21 VITALS — BP 124/78 | HR 56 | Temp 97.6°F | Ht 65.5 in | Wt 206.0 lb

## 2017-09-21 DIAGNOSIS — R059 Cough, unspecified: Secondary | ICD-10-CM | POA: Insufficient documentation

## 2017-09-21 DIAGNOSIS — R05 Cough: Secondary | ICD-10-CM | POA: Diagnosis not present

## 2017-09-21 MED ORDER — GUAIFENESIN-CODEINE 100-10 MG/5ML PO SYRP: 5.0000 mL | ORAL_SOLUTION | Freq: Two times a day (BID) | ORAL | 0 refills | Status: DC | PRN

## 2017-09-21 MED ORDER — PREDNISONE 20 MG PO TABS: ORAL_TABLET | ORAL | 0 refills | Status: DC

## 2017-09-21 NOTE — Assessment & Plan Note (Signed)
Anticipate post inflammatory cough after bronchitis. Supportive care reviewed. Will treat with short prednisone taper and cheratussin codeine cough syrup for night time. Update if persistent cough after treatment for cxr.

## 2017-09-21 NOTE — Patient Instructions (Signed)
I think you have persistent cough after bronchitis - from residual inflammation in the lungs. Treat with steroid course and codeine cough syrup for night time. Push fluids and rest.  Let us know if ongoing cough after treatment - for xray.

## 2017-09-21 NOTE — Progress Notes (Signed)
BP 124/78 (BP Location: Left Arm, Patient Position: Sitting, Cuff Size: Normal)   Pulse (!) 56   Temp 97.6 F (36.4 C) (Oral)   Ht 5' 5.5" (1.664 m)   Wt 206 lb (93.4 kg)   SpO2 98%   BMI 33.76 kg/m    CC: cough Subjective:    Patient ID: Lori Shaw, female    DOB: 11/09/1945, 72 y.o.   MRN: [id number]  HPI: Lori Shaw is a 72 y.o. female presenting on 09/21/2017 for Cough (Pt was seen about 1 mo ago for cough. Says it has improved but still has cough. Coughs up mucous in the mornings but has a dry cough at night. )   Saw Dr Lorelei Pont last month with dx bronchitis, treated with zpack antibiotic. She has also been taking nyquil. Symptoms did largely improve, but cough persists. She has am productive cough, evening and nights dry cough. Trouble sleeping due to cough. No coughing fits. No fevers/chills. R ear irritation - itching. No ST or PNDrainage.   Husband initially sick, now doing better after prednisone course.  Ex smoker - remotely quit 1980.   H/o CLL - stage 0 followed by onc  Upcoming trip out of town.   Relevant past medical, surgical, family and social history reviewed and updated as indicated. Interim medical history since our last visit reviewed. Allergies and medications reviewed and updated. Outpatient Medications Prior to Visit  Medication Sig Dispense Refill  . Cholecalciferol (VITAMIN D) 2000 units CAPS Take 1 capsule by mouth daily.    . Cyanocobalamin (VITAMIN B-12) 2500 MCG SUBL Place 1 tablet (2,500 mcg total) under the tongue daily.  0  . fluticasone (FLONASE) 50 MCG/ACT nasal spray Place 2 sprays into both nostrils daily as needed. 16 g 11  . azithromycin (ZITHROMAX) 250 MG tablet 2 tabs po on day 1, then 1 tab po for 4 days 6 tablet 0   No facility-administered medications prior to visit.      Per HPI unless specifically indicated in ROS section below Review of Systems     Objective:    BP 124/78 (BP Location: Left Arm,  Patient Position: Sitting, Cuff Size: Normal)   Pulse (!) 56   Temp 97.6 F (36.4 C) (Oral)   Ht 5' 5.5" (1.664 m)   Wt 206 lb (93.4 kg)   SpO2 98%   BMI 33.76 kg/m   Wt Readings from Last 3 Encounters:  09/21/17 206 lb (93.4 kg)  08/16/17 205 lb 4 oz (93.1 kg)  07/13/17 207 lb 12 oz (94.2 kg)    Physical Exam  Constitutional: She appears well-developed and well-nourished. No distress.  HENT:  Head: Normocephalic and atraumatic.  Right Ear: Hearing, tympanic membrane, external ear and ear canal normal.  Left Ear: Hearing, tympanic membrane, external ear and ear canal normal.  Nose: No mucosal edema or rhinorrhea. Right sinus exhibits no maxillary sinus tenderness and no frontal sinus tenderness. Left sinus exhibits no maxillary sinus tenderness and no frontal sinus tenderness.  Mouth/Throat: Uvula is midline, oropharynx is clear and moist and mucous membranes are normal. No oropharyngeal exudate, posterior oropharyngeal edema, posterior oropharyngeal erythema or tonsillar abscesses.  Eyes: Pupils are equal, round, and reactive to light. Conjunctivae and EOM are normal. No scleral icterus.  Neck: Normal range of motion. Neck supple.  Cardiovascular: Normal rate, regular rhythm, normal heart sounds and intact distal pulses.  No murmur heard. Pulmonary/Chest: Effort normal and breath sounds normal. No respiratory distress. She has no  wheezes. She has no rales.  Bibasilar crackles that clear with deep cough  Lymphadenopathy:    She has no cervical adenopathy.  Skin: Skin is warm and dry. No rash noted.  Nursing note and vitals reviewed.     Assessment & Plan:   Problem List Items Addressed This Visit    Cough - Primary    Anticipate post inflammatory cough after bronchitis. Supportive care reviewed. Will treat with short prednisone taper and cheratussin codeine cough syrup for night time. Update if persistent cough after treatment for cxr.           Meds ordered this encounter   Medications  . predniSONE (DELTASONE) 20 MG tablet    Sig: Take two tablets daily for 3 days followed by one tablet daily for 4 days    Dispense:  10 tablet    Refill:  0  . guaiFENesin-codeine (CHERATUSSIN AC) 100-10 MG/5ML syrup    Sig: Take 5 mLs by mouth 2 (two) times daily as needed for cough (sedation precautions).    Dispense:  120 mL    Refill:  0   No orders of the defined types were placed in this encounter.   Follow up plan: Return if symptoms worsen or fail to improve.  Ria Bush, MD

## 2017-10-01 DIAGNOSIS — G4733 Obstructive sleep apnea (adult) (pediatric): Secondary | ICD-10-CM | POA: Diagnosis not present

## 2017-11-02 ENCOUNTER — Encounter: Payer: Self-pay | Admitting: Neurology

## 2017-11-02 ENCOUNTER — Ambulatory Visit: Payer: Medicare HMO | Admitting: Neurology

## 2017-11-02 VITALS — BP 112/72 | HR 68 | Ht 65.0 in | Wt 209.0 lb

## 2017-11-02 DIAGNOSIS — E538 Deficiency of other specified B group vitamins: Secondary | ICD-10-CM | POA: Diagnosis not present

## 2017-11-02 DIAGNOSIS — E559 Vitamin D deficiency, unspecified: Secondary | ICD-10-CM

## 2017-11-02 DIAGNOSIS — G4733 Obstructive sleep apnea (adult) (pediatric): Secondary | ICD-10-CM

## 2017-11-02 DIAGNOSIS — Z9989 Dependence on other enabling machines and devices: Secondary | ICD-10-CM | POA: Diagnosis not present

## 2017-11-02 DIAGNOSIS — C9110 Chronic lymphocytic leukemia of B-cell type not having achieved remission: Secondary | ICD-10-CM | POA: Diagnosis not present

## 2017-11-02 DIAGNOSIS — E66811 Obesity, class 1: Secondary | ICD-10-CM

## 2017-11-02 DIAGNOSIS — E669 Obesity, unspecified: Secondary | ICD-10-CM

## 2017-11-02 NOTE — Progress Notes (Signed)
[facility]   Provider:  Larey Seat, M D  Referring Provider: Ria Bush, MD Primary Care Physician:  Ria Bush, MD  Chief Complaint  Patient presents with  . Follow-up    pt with husband. f/u pt states no issues or concerns. DME [facility]    HPI:  Lori Shaw is a 72 y.o. female ,seen here as a revisit for CPAP follow up. She is accompanied by hr husband, who is also my patient.   Chief complaint according to patient : none - just here for routine check up, CPAP  .   Lori Shaw is a meanwhile 72 year old Caucasian female patient seen on 02 November 2017.  She is doing well with her CPAP she is highly compliant at 100%, with an average use time of 8 hours and 50 minutes each night.  Set pressure is 9 cmH2O was 2 cm EPR she has an AHI of 1.1 and very minor air leakage.  Her Epworth sleepiness score is endorsed at 6 out of 24 points fatigue severity at 33 out of 63 points and the geriatric depression score at 1 out of 15 points. She complaint about not being able to lose weight. CLL blood work is stable.    I see Lori Shaw today in the presence of her husband, and she just revealed that she has been diagnosed with CLL, chronic lymphatic leukemia. Her diagnosis was established in June 2018. She is doing well. She feels more fatigued and tired.  As to her CPAP, I do  have a download available today. Her CPAP download was still generated under her maiden name, Gerarda Fraction. The patient has used the machine 100% of the last 30 days with an average of 97% for over 4 hours of daily use, average user time 7 hours 53 minutes, the CPAP is set at 8 cm water, humidifier set at 5, ramp time 20 minutes this download does not generate a residual AHI or other therapeutic data, these were strictly compliance data. The patient underwent a split-night polysomnography in April 2016 after she was already established on CPAP, her AHI was 23.8 without significant  oxygen desaturations. She has been a compliant user over the last 2 years.  She has advanced her bed time to 9.30 Pm. He turns the TV on in the bedroom.    Lori Shaw underwent a split-night polysomnography on 05-22-14, after she is a current CPAP user had presented again with excessive daytime sleepiness and fatigue breakthrough snoring and nocturia. The machine appear to be malfunctioning turning off in the middle of the night and she needed to requalify for CPAP machine. The baseline part of her split-night polysomnography revealed an AHI of 23.8 and an RDI of 42.9. Supine AHI was 32.3 she did not sleep in REM for the first diagnostic part of the study. She had no significant oxygen desaturations. This was important as the patient had also presented this morning headaches. The titration begun at 5 cm water CPAP and advanced to 9 cm but she seemed to have done very well with out EPR. Her AHI became 0.8 and she was using a dream wear mask with a ResMED machine.  As newlyweds she and her husband went to a trip to [place] but unfortunately she forgot the power cord , logging the CPAP overseas. I will review her compliance data up to August 3 and it is clear that the patient is highly compliant and use the machine 93% of the time  for the first 30 days of use. Average user time changed due to the icelandic break / interruption. The machine is set at 9 cm water pressure with 2 cm EPR, the  residual AHI is 1.8. This is a very good resolution of her sleep apnea and she has noted no nocturia no morning headaches and is more alert and at night sound asleep. She still endorsed 17 points on the Epworth Sleepiness Scale . The patient still is excessively daytime sleepy. She states that she falls asleep very promptly once she puts her CPAP on and that her husband, who likes watching TV in the bedroom does not keep her  awake with his habits. She goes to bed between 9 and 9:30 PM and sleeps usually through to 6 AM. She  wakes more restored and refreshed now again without headaches and the dry mouth is no longer an issue.  I would like for Lori Shaw to undergo an HLA narcolepsy test. Her sleepiness is out of proportion to the residual apnea as she presents with, looking for another reason for her sleepiness besides obstructive sleep apnea. She can take up 10 minute power nap and feels great again she reports. At nighttime she does not recall any dreams no nightmares and no vivid dreams especially. She does report occasional waking up being unable to open her eyes being paralyzed.She has never experienced cataplexy due to anger, startling or loud laughing.  Interval history on 09/17/2015 for [name], Shaw. Shaw HLA narcolepsy panel returned negative,  her Epworth sleepiness score however has been reduced to 6 points, fatigue severity 32 points and the geriatric depression score was clinically insignificant at 1 out of 15 points. She has been very compliant with her CPAP use 100% of the last 30 days with an average user time of 8 hours and 26 minutes, the machine is set at 9 cm water with 2 cm EPR, the residual AHI is 1.1. There have been no major air leaks. I conclude that the CPAP therapy does reduce her daytime sleepiness significantly. The couple has been very active this summer and traveled through [place] and [place]".  Sleep medical history and family sleep history:  Her brothers have CPAP machine, sister that snores, but was not tested, both parents had witnessed apnea, untreated - both died of heart disease Social history: remarried. Her husband has MS.  2 beverages with caffeine a day  , rare alcohol drinker, former smoker.   Review of Systems: Out of a complete 14 system review, the patient complains of only the following symptoms, and all other reviewed systems are negative. See above   Epworth score remains at  6 from 17/ 24 points.  FSS: 33 from 47/ 63.    Keep on CPAP. HLA negative. B12  supplemented.    Social History   Socioeconomic History  . Marital status: Married    Spouse name: Not on file  . Number of children: 2  . Years of education: 1.5  . Highest education level: Not on file  Occupational History  . Occupation: retired  Scientific laboratory technician  . Financial resource strain: Not on file  . Food insecurity:    Worry: Not on file    Inability: Not on file  . Transportation needs:    Medical: Not on file    Non-medical: Not on file  Tobacco Use  . Smoking status: Former Smoker    Packs/day: 2.00    Years: 20.00    Pack years: 40.00  Types: Cigarettes    Last attempt to quit: 01/26/1978    Years since quitting: 39.7  . Smokeless tobacco: Never Used  Substance and Sexual Activity  . Alcohol use: No  . Drug use: Yes    Comment: occassionally glass of wine  . Sexual activity: Yes  Lifestyle  . Physical activity:    Days per week: Not on file    Minutes per session: Not on file  . Stress: Not on file  Relationships  . Social connections:    Talks on phone: Not on file    Gets together: Not on file    Attends religious service: Not on file    Active member of club or organization: Not on file    Attends meetings of clubs or organizations: Not on file    Relationship status: Not on file  . Intimate partner violence:    Fear of current or ex partner: Not on file    Emotionally abused: Not on file    Physically abused: Not on file    Forced sexual activity: Not on file  Other Topics Concern  . Not on file  Social History Narrative   Lives with daughter, 1 dog   Occupation: retired, has worked in Chief Strategy Officer, also Forensic psychologist   Edu: 1.5 yrs college   Activity: square dancing   Diet: good water, fruits/vegetables daily   Patient drinks two cups of caffeine daily.   Remarried.      Family History  Problem Relation Age of Onset  . Hyperlipidemia Mother   . CAD Mother        CABG  . Hyperlipidemia Father   . CAD Father        MI  .  Arthritis Maternal Grandmother   . Cancer Sister        breast  . Breast cancer Sister 78  . Alzheimer's disease Unknown   . Heart attack Unknown   . Diabetes Neg Hx   . Stroke Neg Hx     Past Medical History:  Diagnosis Date  . CLL (chronic lymphocytic leukemia) ([place]) 06/21/2016   Rai stage 0, dx 06/2016 (Dr Janese Banks)  . HLD (hyperlipidemia)   . OSA on CPAP 2006, 04/2014   AHI of 23.8 and an RDI of 42.9, on CPAP 9 cm H2O ([facility])  . Osteopenia 08/2010   T -1.4 at L femur neck  . Piriformis syndrome 12/14/2014    Past Surgical History:  Procedure Laterality Date  . ABDOMINAL HYSTERECTOMY  01/27/1983   for menometrorrhagia, one ovary remained  . COLONOSCOPY  2012   no records available. per patient normal.   . dexa  08/2010   osteopenia  . TONSILLECTOMY  01/26/1973    Current Outpatient Medications  Medication Sig Dispense Refill  . Cholecalciferol (VITAMIN D) 2000 units CAPS Take 1 capsule by mouth daily.    . Cyanocobalamin (VITAMIN B-12) 2500 MCG SUBL Place 1 tablet (2,500 mcg total) under the tongue daily.  0  . fluticasone (FLONASE) 50 MCG/ACT nasal spray Place 2 sprays into both nostrils daily as needed. 16 g 11   No current facility-administered medications for this visit.     Allergies as of 11/02/2017 - Review Complete 11/02/2017  Allergen Reaction Noted  . Sulfa antibiotics  01/17/2013    Vitals: BP 112/72   Pulse 68   Ht _0  (1.651 m)   Wt 209 lb (94.8 kg)   BMI 34.78 kg/m  Last Weight:  Wt Readings  from Last 1 Encounters:  11/02/17 209 lb (94.8 kg)   COD:IRYR mass index is 34.78 kg/m.     Last Height:   Ht Readings from Last 1 Encounters:  11/02/17 _0  (1.651 m)    Physical exam:  General: The patient is awake, alert.The patient is well groomed. Head: Normocephalic, atraumatic. Neck is supple. Mallampati 3   neck circumference:15.5 Nasal airflow restricted , TMJ is not  evident .  Retrognathia is not seen.  Cardiovascular:  Regular rate and  rhythm , Respiratory: Lungs a- mild rhonci . Skin:  Without evidence of edema, or rash.  She is tanned. Trunk: BMI is 33. The patient's posture is erect    Neurologic exam :The patient is awake and alert, oriented to place and time.   Attention span & concentration ability appears normal. Speech is fluent, withdysarthria, dysphonia -not  aphasia.  Mood and affect are appropriate.  Cranial nerves: intact taste and smell. Pupils are equal and briskly reactive to light.  Extraocular movements  intact and without nystagmus. Visual fields by finger perimetry are intact. Facial sensation intact to fine touch. Facial motor strength is symmetric and tongue and uvula move midline. Shoulder shrug was symmetrical.   Deep tendon reflexes: in the  upper and lower extremities are symmetric and intact.  The patient was advised of the nature of the diagnosed sleep disorder , the treatment options and risks for general a health and wellness arising from not treating the condition.  I spent more than 15  minutes of face to face time with the patient. Greater than 50% of time was spent in counseling and coordination of care. We have discussed the diagnosis and differential and I answered the patient's questions.     Assessment:  After physical and neurologic examination, review of laboratory studies,  Personal review of imaging studies, reports of other /same  Imaging studies ,  Results of polysomnography/ neurophysiology testing and pre-existing records as far as provided in visit., my assessment is   1) Obstructive sleep apnea confirmed in a polysomnography by split night protocol on 05-22-14.  The patient had an AHI of 23.8 and an RDI of 42.9. This verifies her history of thunderous snoring. UARS She had many more snoring related events than actual true apneas.  CPAP was titrated to 9 cm water pressure and alleviated the AHI. She now has responded again with an Epworth of 6 ! Uses CPAP at 8 CM   2) Obesity is  main risk factor. Patient well aware but frustrated with diet results. Referral to medical weight management.   3) CLL diagnosed, may explain her fatigue . FSS 33/ 63 points.    RV in 54 month    Asencion Partridge Yaire Kreher MD  11/02/2017   CC: Ria Bush, [address] [place], [place] [id number]

## 2017-11-02 NOTE — Patient Instructions (Signed)

## 2017-11-18 ENCOUNTER — Encounter (INDEPENDENT_AMBULATORY_CARE_PROVIDER_SITE_OTHER): Payer: Medicare HMO

## 2017-11-23 DIAGNOSIS — R69 Illness, unspecified: Secondary | ICD-10-CM | POA: Diagnosis not present

## 2017-11-25 ENCOUNTER — Encounter (INDEPENDENT_AMBULATORY_CARE_PROVIDER_SITE_OTHER): Payer: Self-pay | Admitting: Family Medicine

## 2017-11-25 ENCOUNTER — Ambulatory Visit (INDEPENDENT_AMBULATORY_CARE_PROVIDER_SITE_OTHER): Payer: Medicare HMO | Admitting: Family Medicine

## 2017-11-25 VITALS — BP 119/71 | HR 59 | Temp 97.6°F | Ht 65.0 in | Wt 203.0 lb

## 2017-11-25 DIAGNOSIS — E559 Vitamin D deficiency, unspecified: Secondary | ICD-10-CM

## 2017-11-25 DIAGNOSIS — R0602 Shortness of breath: Secondary | ICD-10-CM | POA: Diagnosis not present

## 2017-11-25 DIAGNOSIS — Z1331 Encounter for screening for depression: Secondary | ICD-10-CM

## 2017-11-25 DIAGNOSIS — Z6837 Body mass index (BMI) 37.0-37.9, adult: Secondary | ICD-10-CM | POA: Diagnosis not present

## 2017-11-25 DIAGNOSIS — Z0289 Encounter for other administrative examinations: Secondary | ICD-10-CM

## 2017-11-25 DIAGNOSIS — R5383 Other fatigue: Secondary | ICD-10-CM | POA: Diagnosis not present

## 2017-11-25 DIAGNOSIS — E538 Deficiency of other specified B group vitamins: Secondary | ICD-10-CM | POA: Diagnosis not present

## 2017-11-25 DIAGNOSIS — E7849 Other hyperlipidemia: Secondary | ICD-10-CM

## 2017-11-25 DIAGNOSIS — R739 Hyperglycemia, unspecified: Secondary | ICD-10-CM | POA: Diagnosis not present

## 2017-11-25 NOTE — Progress Notes (Signed)
97

## 2017-11-25 NOTE — Progress Notes (Signed)
.  Office: [phone]  /  Fax: [phone]   HPI:   Chief Complaint: Lori Shaw (MR# [id number]) is a 72 y.o. female who presents on 11/25/2017 for obesity evaluation and treatment. Current BMI is Body mass index is 33.78 kg/m.. Lori Shaw has struggled with obesity for years and has been unsuccessful in either losing weight or maintaining long term weight loss. Lori Shaw attended our information session and states she is currently in the action stage of change and ready to dedicate time achieving and maintaining a healthier weight.  Lori Shaw's husband has chronic renal insufficiency and he is on a special diet.   Lori Shaw states her family eats meals together she thinks her family will eat healthier with  her her desired weight loss is 63 lbs she started gaining weight at age 82 her heaviest weight ever was 209 lbs she has significant food cravings issues  she skips meals frequently she is trying to eat vegetarian she is trying to eat vegan she is frequently drinking liquids with calories she frequently makes poor food choices she struggles with emotional eating    Fatigue Lori Shaw feels her energy is lower than it should be. This has worsened with weight gain and has not worsened recently. Lori Shaw admits to daytime somnolence and  admits to waking up still tired. Patient has a history of obstructive sleep apnea with the use of CPAP. Patent has a history of symptoms of daytime fatigue and morning headache. Patient generally gets 8 hours of sleep per night, and states they generally have generally restful sleep with CPAP. Snoring is present without CPAP. Apneic episodes are present without CPAP. Epworth Sleepiness Score is 5.  Dyspnea on exertion Lori Shaw notes increasing shortness of breath with exercising and seems to be worsening over time with weight gain. She notes getting out of breath sooner with activity than she used to. This has not gotten worse  recently. Lori Shaw denies orthopnea.  Vitamin D Deficiency Lori Shaw has a diagnosis of vitamin D deficiency. She is on OTC Vit D, she notes fatigue and denies nausea, vomiting or muscle weakness.  Hyperlipidemia Lori Shaw has hyperlipidemia and she is trying to improve her cholesterol levels with intensive lifestyle modification including a low saturated fat diet, exercise and weight loss. She is not on statin and denies any chest pain, claudication or myalgias.  Vitamin B12 Deficiency Lori Shaw has a diagnosis of B12 insufficiency and notes fatigue. She is on Vit B12. She is a vegetarian and does not have a previous diagnosis of pernicious anemia. She does not have a history of weight loss surgery.   Hyperglycemia Lori Shaw has had occasional elevated blood glucose readings in the past without a diagnosis of diabetes. She admits to polyphagia.  Depression Screen Lori Shaw's Food and Mood (modified PHQ-9) score was  Depression screen PHQ 2/9 11/25/2017  Decreased Interest 3  Down, Depressed, Hopeless 1  PHQ - 2 Score 4  Altered sleeping 1  Tired, decreased energy 3  Change in appetite 1  Feeling bad or failure about yourself  0  Trouble concentrating 0  Moving slowly or fidgety/restless 0  Suicidal thoughts 0  PHQ-9 Score 9  Difficult doing work/chores Somewhat difficult    ALLERGIES: Allergies  Allergen Reactions  . Sulfa Antibiotics Nausea Only    MEDICATIONS: Current Outpatient Medications on File Prior to Visit  Medication Sig Dispense Refill  . Cholecalciferol (VITAMIN D) 2000 units CAPS Take 1 capsule by mouth daily.    . Cyanocobalamin (VITAMIN B-12) 2500 MCG  SUBL Place 1 tablet (2,500 mcg total) under the tongue daily.  0  . fluticasone (FLONASE) 50 MCG/ACT nasal spray Place 2 sprays into both nostrils daily as needed. 16 g 11   No current facility-administered medications on file prior to visit.     PAST MEDICAL HISTORY: Past Medical History:  Diagnosis Date  .  B12 deficiency   . Back pain   . CLL (chronic lymphocytic leukemia) ([place]) 06/21/2016   Rai stage 0, dx 06/2016 (Dr Janese Banks)  . HLD (hyperlipidemia)   . Joint pain   . Knee problem   . OSA on CPAP 2006, 04/2014   AHI of 23.8 and an RDI of 42.9, on CPAP 9 cm H2O ([place])  . Osteopenia 08/2010   T -1.4 at L femur neck  . Piriformis syndrome 12/14/2014  . Vitamin D deficiency     PAST SURGICAL HISTORY: Past Surgical History:  Procedure Laterality Date  . ABDOMINAL HYSTERECTOMY  01/27/1983   for menometrorrhagia, one ovary remained  . COLONOSCOPY  2012   no records available. per patient normal.   . dexa  08/2010   osteopenia  . TONSILLECTOMY  01/26/1973    SOCIAL HISTORY: Social History   Tobacco Use  . Smoking status: Former Smoker    Packs/day: 2.00    Years: 20.00    Pack years: 40.00    Types: Cigarettes    Last attempt to quit: 01/26/1978    Years since quitting: 39.8  . Smokeless tobacco: Never Used  Substance Use Topics  . Alcohol use: No  . Drug use: Yes    Comment: occassionally glass of wine    FAMILY HISTORY: Family History  Problem Relation Age of Onset  . Hyperlipidemia Mother   . CAD Mother        CABG  . Hypertension Mother   . Hyperlipidemia Father   . CAD Father        MI  . Sudden death Father   . Arthritis Maternal Grandmother   . Cancer Sister        breast  . Breast cancer Sister 59  . Alzheimer's disease Unknown   . Heart attack Unknown   . Diabetes Neg Hx   . Stroke Neg Hx     ROS: Review of Systems  Constitutional: Positive for malaise/fatigue. Negative for weight loss.  HENT:       + Dentures (partials)  Eyes:       + Wear glasses or contacts  Respiratory: Positive for shortness of breath (with exertion).   Cardiovascular: Negative for chest pain, orthopnea and claudication.  Gastrointestinal: Negative for nausea and vomiting.  Musculoskeletal: Positive for back pain. Negative for myalgias.       Negative muscle weakness +  Muscle or joint pain + Muscle stiffness  Endo/Heme/Allergies:       Negative hypoglycemia Positive polyphagia    PHYSICAL EXAM: Blood pressure 119/71, pulse (!) 59, temperature 97.6 F (36.4 C), temperature source Oral, height 5\' 5"  (1.651 m), weight 203 lb (92.1 kg), SpO2 99 %. Body mass index is 33.78 kg/m. Physical Exam  Constitutional: She is oriented to person, place, and time. She appears well-developed and well-nourished.  HENT:  Head: Normocephalic and atraumatic.  Nose: Nose normal.  Eyes: EOM are normal. No scleral icterus.  Neck: Normal range of motion. Neck supple. No thyromegaly present.  Cardiovascular: Normal rate and regular rhythm.  Pulmonary/Chest: Effort normal. No respiratory distress.  Abdominal: Soft. There is no tenderness.  + Obesity  Musculoskeletal:  Range of Motion normal in all 4 extremities Trace edema noted in bilateral lower extremities  Neurological: She is alert and oriented to person, place, and time. Coordination normal.  Skin: Skin is warm and dry.  Psychiatric: She has a normal mood and affect. Her behavior is normal.  Vitals reviewed.   RECENT LABS AND TESTS: BMET    Component Value Date/Time   NA 141 07/13/2017 1400   K 3.9 07/13/2017 1400   CL 107 07/13/2017 1400   CO2 24 07/13/2017 1400   GLUCOSE 96 07/13/2017 1400   BUN 13 07/13/2017 1400   CREATININE 0.94 07/13/2017 1400   CALCIUM 9.2 07/13/2017 1400   No results found for: HGBA1C No results found for: INSULIN CBC    Component Value Date/Time   WBC 17.1 (H) 07/06/2017 1422   RBC 4.90 07/06/2017 1422   HGB 15.2 07/06/2017 1422   HGB 14.5 04/10/2010   HCT 44.6 07/06/2017 1422   PLT 202 07/06/2017 1422   MCV 91.1 07/06/2017 1422   MCH 31.1 07/06/2017 1422   MCHC 34.2 07/06/2017 1422   RDW 14.2 07/06/2017 1422   LYMPHSABS 10.7 (H) 07/06/2017 1422   MONOABS 0.8 07/06/2017 1422   EOSABS 0.3 07/06/2017 1422   BASOSABS 0.1 07/06/2017 1422   Iron/TIBC/Ferritin/  %Sat No results found for: IRON, TIBC, FERRITIN, IRONPCTSAT Lipid Panel     Component Value Date/Time   CHOL 223 (H) 07/13/2017 1400   TRIG 230.0 (H) 07/13/2017 1400   TRIG 152 04/10/2010   HDL 46.60 07/13/2017 1400   CHOLHDL 5 07/13/2017 1400   VLDL 46.0 (H) 07/13/2017 1400   LDLCALC 141 (H) 04/10/2013 0817   LDLCALC 67 04/10/2010   LDLDIRECT 141.0 07/13/2017 1400   Hepatic Function Panel     Component Value Date/Time   PROT 6.5 06/19/2016 0808   ALBUMIN 4.4 06/19/2016 0808   AST 18 06/19/2016 0808   AST 18 04/10/2010   ALT 24 06/19/2016 0808   ALKPHOS 66 06/19/2016 0808   ALKPHOS 79 04/10/2010   BILITOT 0.6 06/19/2016 0808      Component Value Date/Time   TSH 3.46 06/19/2016 0808   Vitamin D Results for JOSEPHINA, MELCHER (MRN [id number]) as of 11/25/2017 12:25  Ref. Range 07/13/2017 14:00  VITD Latest Ref Range: 30.00 - 100.00 ng/mL 23.82 (L)   Vitamin B12 Results for SAHASRA, BELUE (MRN [id number]) as of 11/25/2017 12:25  Ref. Range 07/13/2017 14:00  Vitamin B12 Latest Ref Range: 211 - 911 pg/mL 911   ECG  shows NSR with a rate of 60 BPM INDIRECT CALORIMETER done today shows a VO2 of 249 and a REE of 1732. Her calculated basal metabolic rate is 7829 thus her basal metabolic rate is better than expected.    ASSESSMENT AND PLAN: Other fatigue - Plan: EKG 12-Lead, CBC With Differential, Folate, T3, T4, free, TSH  Shortness of breath on exertion  Vitamin D deficiency - Plan: VITAMIN D 25 Hydroxy (Vit-D Deficiency, Fractures)  Other hyperlipidemia - Plan: Lipid Panel With LDL/HDL Ratio  B12 nutritional deficiency - Plan: Vitamin B12  Hyperglycemia - Plan: Comprehensive metabolic panel, Hemoglobin A1c, Insulin, random  Depression screening  Class 2 severe obesity with serious comorbidity and body mass index (BMI) of 37.0 to 37.9 in adult, unspecified obesity type [facility])  PLAN:  Fatigue Lori Shaw was informed that her fatigue may be related to  obesity, depression or many other causes. Labs will be ordered, and in the meanwhile Lori Shaw has agreed to  work on diet, exercise and weight loss to help with fatigue. Proper sleep hygiene was discussed including the need for 7-8 hours of quality sleep each night. A sleep study was not ordered based on symptoms and Epworth score.  Dyspnea on exertion Audria's shortness of breath appears to be obesity related and exercise induced. She has agreed to work on weight loss and gradually increase exercise to treat her exercise induced shortness of breath. If Lori Shaw follows our instructions and loses weight without improvement of her shortness of breath, we will plan to refer to pulmonology. We will monitor this condition regularly. Lori Shaw agrees to this plan.  Vitamin D Deficiency Lori Shaw was informed that low vitamin D levels contributes to fatigue and are associated with obesity, breast, and colon cancer. Lori Shaw agrees to continue taking OTC Vit D and will follow up for routine testing of vitamin D, at least 2-3 times per year. She was informed of the risk of over-replacement of vitamin D and agrees to not increase her dose unless she discusses this with [name] first. We will check labs and Lori Shaw agrees to follow up with our clinic in 2 weeks.  Hyperlipidemia Lori Shaw was informed of the American Heart Association Guidelines emphasizing intensive lifestyle modifications as the first line treatment for hyperlipidemia. We discussed many lifestyle modifications today in depth, and Lori Shaw will continue to work on decreasing saturated fats such as fatty red meat, butter and many fried foods. She will start diet and will also increase vegetables and lean protein in her diet and continue to work on exercise and weight loss efforts. We will check labs and Lori Shaw agrees to follow up with our clinic in 2 weeks.  Vitamin B12 Deficiency Lori Shaw will work on increasing B12 rich foods in her diet. B12  supplementation was not prescribed today. We will check labs and Lori Shaw agrees to follow up with our clinic in 2 weeks.  Hyperglycemia Fasting labs will be obtained today and results with be discussed with Lori Shaw in 2 weeks at her follow up visit. In the meanwhile Lori Shaw was started on a lower simple carbohydrate diet and will work on weight loss efforts.  Depression [place] had a mildly positive depression screening. Depression is commonly associated with obesity and often results in emotional eating behaviors. We will monitor this closely and work on CBT to help improve the non-hunger eating patterns. Referral to Psychology may be required if no improvement is seen as she continues in our clinic.  Obesity Lori Shaw is currently in the action stage of change and her goal is to continue with weight loss efforts She has agreed to follow the Category 2 plan + 100 calories Lori Shaw has been instructed to work up to a goal of 150 minutes of combined cardio and strengthening exercise per week for weight loss and overall health benefits. We discussed the following Behavioral Modification Strategies today: increasing lean protein intake, decreasing simple carbohydrates  and work on meal planning and easy cooking plans  Lori Shaw has agreed to follow up with our clinic in 2 weeks. She was informed of the importance of frequent follow up visits to maximize her success with intensive lifestyle modifications for her multiple health conditions. She was informed we would discuss her lab results at her next visit unless there is a critical issue that needs to be addressed sooner. Lori Shaw agreed to keep her next visit at the agreed upon time to discuss these results.    OBESITY BEHAVIORAL INTERVENTION VISIT  Today's visit was #  1   Starting weight: 203 lbs Starting date: 11/25/17 Today's weight : 203 lbs Today's date: 11/25/2017 Total lbs lost to date: 0 At least 15 minutes were spent on  discussing the following behavioral intervention visit.   ASK: We discussed the diagnosis of obesity with [place] today and Lori Shaw agreed to give [name] permission to discuss obesity behavioral modification therapy today.  ASSESS: Lori Shaw has the diagnosis of obesity and her BMI today is 9.78 Lori Shaw is in the action stage of change   ADVISE: Lori Shaw was educated on the multiple health risks of obesity as well as the benefit of weight loss to improve her health. She was advised of the need for long term treatment and the importance of lifestyle modifications to improve her current health and to decrease her risk of future health problems.  AGREE: Multiple dietary modification options and treatment options were discussed and  Lori Shaw agreed to follow the recommendations documented in the above note.  ARRANGE: Lori Shaw was educated on the importance of frequent visits to treat obesity as outlined per CMS and USPSTF guidelines and agreed to schedule her next follow up appointment today.   I, Trixie Dredge, am acting as transcriptionist for Dennard Nip, MD  I have reviewed the above documentation for accuracy and completeness, and I agree with the above. -Dennard Nip, MD

## 2017-11-28 LAB — HEMOGLOBIN A1C
ESTIMATED AVERAGE GLUCOSE: 111 mg/dL
Hgb A1c MFr Bld: 5.5 % (ref 4.8–5.6)

## 2017-11-28 LAB — T3: T3 TOTAL: 124 ng/dL (ref 71–180)

## 2017-11-28 LAB — COMPREHENSIVE METABOLIC PANEL
ALBUMIN: 4.2 g/dL (ref 3.5–4.8)
ALT: 22 IU/L (ref 0–32)
AST: 20 IU/L (ref 0–40)
Albumin/Globulin Ratio: 2 (ref 1.2–2.2)
Alkaline Phosphatase: 74 IU/L (ref 39–117)
BILIRUBIN TOTAL: 0.5 mg/dL (ref 0.0–1.2)
BUN/Creatinine Ratio: 14 (ref 12–28)
BUN: 10 mg/dL (ref 8–27)
CALCIUM: 9.3 mg/dL (ref 8.7–10.3)
CHLORIDE: 106 mmol/L (ref 96–106)
CO2: 17 mmol/L — ABNORMAL LOW (ref 20–29)
CREATININE: 0.73 mg/dL (ref 0.57–1.00)
GFR, EST AFRICAN AMERICAN: 96 mL/min/{1.73_m2} (ref >59)
GFR, EST NON AFRICAN AMERICAN: 83 mL/min/{1.73_m2} (ref >59)
GLUCOSE: 78 mg/dL (ref 65–99)
Globulin, Total: 2.1 g/dL (ref 1.5–4.5)
Potassium: 4.5 mmol/L (ref 3.5–5.2)
Sodium: 143 mmol/L (ref 134–144)
TOTAL PROTEIN: 6.3 g/dL (ref 6.0–8.5)

## 2017-11-28 LAB — CBC WITH DIFFERENTIAL
Basophils Absolute: 0.1 10*3/uL (ref 0.0–0.2)
Basos: 1 %
EOS (ABSOLUTE): 0.3 10*3/uL (ref 0.0–0.4)
EOS: 2 %
HEMOGLOBIN: 15.1 g/dL (ref 11.1–15.9)
Hematocrit: 44.8 % (ref 34.0–46.6)
IMMATURE GRANS (ABS): 0 10*3/uL (ref 0.0–0.1)
IMMATURE GRANULOCYTES: 0 %
LYMPHS: 61 %
Lymphocytes Absolute: 8.7 10*3/uL — ABNORMAL HIGH (ref 0.7–3.1)
MCH: 31.1 pg (ref 26.6–33.0)
MCHC: 33.7 g/dL (ref 31.5–35.7)
MCV: 92 fL (ref 79–97)
MONOCYTES: 13 %
Monocytes Absolute: 1.8 10*3/uL — ABNORMAL HIGH (ref 0.1–0.9)
NEUTROS ABS: 3.3 10*3/uL (ref 1.4–7.0)
NEUTROS PCT: 23 %
RBC: 4.86 x10E6/uL (ref 3.77–5.28)
RDW: 13.5 % (ref 12.3–15.4)
WBC: 14.3 10*3/uL — ABNORMAL HIGH (ref 3.4–10.8)

## 2017-11-28 LAB — LIPID PANEL WITH LDL/HDL RATIO
CHOLESTEROL TOTAL: 229 mg/dL — AB (ref 100–199)
HDL: 47 mg/dL (ref >39)
LDL Calculated: 145 mg/dL — ABNORMAL HIGH (ref 0–99)
LDl/HDL Ratio: 3.1 ratio (ref 0.0–3.2)
Triglycerides: 184 mg/dL — ABNORMAL HIGH (ref 0–149)
VLDL Cholesterol Cal: 37 mg/dL (ref 5–40)

## 2017-11-28 LAB — INSULIN, RANDOM: INSULIN: 6.6 u[IU]/mL (ref 2.6–24.9)

## 2017-11-28 LAB — TSH: TSH: 2.51 u[IU]/mL (ref 0.450–4.500)

## 2017-11-28 LAB — FOLATE: Folate: 9.9 ng/mL (ref >3.0)

## 2017-11-28 LAB — VITAMIN D 25 HYDROXY (VIT D DEFICIENCY, FRACTURES): Vit D, 25-Hydroxy: 24.4 ng/mL — ABNORMAL LOW (ref 30.0–100.0)

## 2017-11-28 LAB — VITAMIN B12: Vitamin B-12: 533 pg/mL (ref 232–1245)

## 2017-11-28 LAB — T4, FREE: Free T4: 1.16 ng/dL (ref 0.82–1.77)

## 2017-12-01 DIAGNOSIS — R69 Illness, unspecified: Secondary | ICD-10-CM | POA: Diagnosis not present

## 2017-12-14 ENCOUNTER — Ambulatory Visit (INDEPENDENT_AMBULATORY_CARE_PROVIDER_SITE_OTHER): Payer: Medicare HMO | Admitting: Family Medicine

## 2017-12-14 VITALS — BP 117/73 | HR 66 | Temp 97.5°F | Ht 65.0 in | Wt 197.0 lb

## 2017-12-14 DIAGNOSIS — E559 Vitamin D deficiency, unspecified: Secondary | ICD-10-CM | POA: Diagnosis not present

## 2017-12-14 DIAGNOSIS — E669 Obesity, unspecified: Secondary | ICD-10-CM

## 2017-12-14 DIAGNOSIS — Z6832 Body mass index (BMI) 32.0-32.9, adult: Secondary | ICD-10-CM | POA: Diagnosis not present

## 2017-12-14 DIAGNOSIS — E782 Mixed hyperlipidemia: Secondary | ICD-10-CM | POA: Diagnosis not present

## 2017-12-14 MED ORDER — VITAMIN D (ERGOCALCIFEROL) 1.25 MG (50000 UNIT) PO CAPS: 50000.0000 [IU] | ORAL_CAPSULE | ORAL | 0 refills | Status: DC

## 2017-12-15 NOTE — Progress Notes (Signed)
Office: [phone]  /  Fax: [phone]   HPI:   Chief Complaint: Lori Shaw is here to discuss her progress with her obesity treatment plan. She is on the Category 2 plan + 100 calories and is following her eating plan approximately 95 % of the time. She states she is walking and doing toning exercised 10 to 15 minutes 4 times per week. Lori Shaw did well with weight loss on her Category 2 plan, but she found it difficult to eat all of her evening protein. Her weight is 197 lb (89.4 kg) today and has had a weight loss of 6 pounds over a period of 2 to 3 weeks since her last visit. She has lost 6 lbs since starting treatment with Lori Shaw.  Vitamin D deficiency [name] has a diagnosis of vitamin D deficiency. She is currently taking OTC vit D at 2,000 IU daily and her level is still low. Lori Shaw admits to fatigue and denies nausea, vomiting or muscle weakness.  Hyperlipidemia (mixed) Lori Shaw has mixed hyperlipidemia with elevated LDL and triglycerides and she is not on statin. She would like to attempt to control her cholesterol levels with intensive lifestyle modification including a low saturated fat diet, exercise and weight loss. Her ASCUD risk is 9.4. She denies any chest pain.  ALLERGIES: Allergies  Allergen Reactions  . Sulfa Antibiotics Nausea Only    MEDICATIONS: Current Outpatient Medications on File Prior to Visit  Medication Sig Dispense Refill  . Cholecalciferol (VITAMIN D) 2000 units CAPS Take 1 capsule by mouth daily.    . Cyanocobalamin (VITAMIN B-12) 2500 MCG SUBL Place 1 tablet (2,500 mcg total) under the tongue daily.  0  . fluticasone (FLONASE) 50 MCG/ACT nasal spray Place 2 sprays into both nostrils daily as needed. 16 g 11   No current facility-administered medications on file prior to visit.     PAST MEDICAL HISTORY: Past Medical History:  Diagnosis Date  . B12 deficiency   . Back pain   . CLL (chronic lymphocytic leukemia) ([place]) 06/21/2016   Rai  stage 0, dx 06/2016 (Dr Janese Banks)  . HLD (hyperlipidemia)   . Joint pain   . Knee problem   . OSA on CPAP 2006, 04/2014   AHI of 23.8 and an RDI of 42.9, on CPAP 9 cm H2O (Dohmeier)  . Osteopenia 08/2010   T -1.4 at L femur neck  . Piriformis syndrome 12/14/2014  . Vitamin D deficiency     PAST SURGICAL HISTORY: Past Surgical History:  Procedure Laterality Date  . ABDOMINAL HYSTERECTOMY  01/27/1983   for menometrorrhagia, one ovary remained  . COLONOSCOPY  2012   no records available. per patient normal.   . dexa  08/2010   osteopenia  . TONSILLECTOMY  01/26/1973    SOCIAL HISTORY: Social History   Tobacco Use  . Smoking status: Former Smoker    Packs/day: 2.00    Years: 20.00    Pack years: 40.00    Types: Cigarettes    Last attempt to quit: 01/26/1978    Years since quitting: 39.9  . Smokeless tobacco: Never Used  Substance Use Topics  . Alcohol use: No  . Drug use: Yes    Comment: occassionally glass of wine    FAMILY HISTORY: Family History  Problem Relation Age of Onset  . Hyperlipidemia Mother   . CAD Mother        CABG  . Hypertension Mother   . Hyperlipidemia Father   . CAD Father  MI  . Sudden death Father   . Arthritis Maternal Grandmother   . Cancer Sister        breast  . Breast cancer Sister 61  . Alzheimer's disease Unknown   . Heart attack Unknown   . Diabetes Neg Hx   . Stroke Neg Hx     ROS: Review of Systems  Constitutional: Positive for malaise/fatigue and weight loss.  Cardiovascular: Negative for chest pain.  Gastrointestinal: Negative for nausea and vomiting.  Musculoskeletal:       Negative for muscle weakness    PHYSICAL EXAM: Blood pressure 117/73, pulse 66, temperature (!) 97.5 F (36.4 C), temperature source Oral, height 5\' 5"  (1.651 m), weight 197 lb (89.4 kg), SpO2 95 %. Body mass index is 32.78 kg/m. Physical Exam  Constitutional: She is oriented to person, place, and time. She appears well-developed and  well-nourished.  Cardiovascular: Normal rate.  Pulmonary/Chest: Effort normal.  Musculoskeletal: Normal range of motion.  Neurological: She is oriented to person, place, and time.  Skin: Skin is warm and dry.  Psychiatric: She has a normal mood and affect. Her behavior is normal.  Vitals reviewed.   RECENT LABS AND TESTS: BMET    Component Value Date/Time   NA 143 11/25/2017 1208   K 4.5 11/25/2017 1208   CL 106 11/25/2017 1208   CO2 17 (L) 11/25/2017 1208   GLUCOSE 78 11/25/2017 1208   GLUCOSE 96 07/13/2017 1400   BUN 10 11/25/2017 1208   CREATININE 0.73 11/25/2017 1208   CALCIUM 9.3 11/25/2017 1208   GFRNONAA 83 11/25/2017 1208   GFRAA 96 11/25/2017 1208   Lab Results  Component Value Date   HGBA1C 5.5 11/25/2017   Lab Results  Component Value Date   INSULIN 6.6 11/25/2017   CBC    Component Value Date/Time   WBC 14.3 (H) 11/25/2017 1208   WBC 17.1 (H) 07/06/2017 1422   RBC 4.86 11/25/2017 1208   RBC 4.90 07/06/2017 1422   HGB 15.1 11/25/2017 1208   HGB 14.5 04/10/2010   HCT 44.8 11/25/2017 1208   PLT 202 07/06/2017 1422   MCV 92 11/25/2017 1208   MCH 31.1 11/25/2017 1208   MCH 31.1 07/06/2017 1422   MCHC 33.7 11/25/2017 1208   MCHC 34.2 07/06/2017 1422   RDW 13.5 11/25/2017 1208   LYMPHSABS 8.7 (H) 11/25/2017 1208   MONOABS 0.8 07/06/2017 1422   EOSABS 0.3 11/25/2017 1208   BASOSABS 0.1 11/25/2017 1208   Iron/TIBC/Ferritin/ %Sat No results found for: IRON, TIBC, FERRITIN, IRONPCTSAT Lipid Panel     Component Value Date/Time   CHOL 229 (H) 11/25/2017 1208   TRIG 184 (H) 11/25/2017 1208   TRIG 152 04/10/2010   HDL 47 11/25/2017 1208   CHOLHDL 5 07/13/2017 1400   VLDL 46.0 (H) 07/13/2017 1400   LDLCALC 145 (H) 11/25/2017 1208   LDLCALC 67 04/10/2010   LDLDIRECT 141.0 07/13/2017 1400   Hepatic Function Panel     Component Value Date/Time   PROT 6.3 11/25/2017 1208   ALBUMIN 4.2 11/25/2017 1208   AST 20 11/25/2017 1208   AST 18 04/10/2010    ALT 22 11/25/2017 1208   ALKPHOS 74 11/25/2017 1208   ALKPHOS 79 04/10/2010   BILITOT 0.5 11/25/2017 1208      Component Value Date/Time   TSH 2.510 11/25/2017 1208   TSH 3.46 06/19/2016 0808   TSH 1.28 09/24/2015 0831   Results for LISMARY, KIEHN (MRN [id number]) as of 12/15/2017 13:46  Ref.  Range 11/25/2017 12:08  Vitamin D, 25-Hydroxy Latest Ref Range: 30.0 - 100.0 ng/mL 24.4 (L)   ASSESSMENT AND PLAN: Vitamin D deficiency - Plan: Vitamin D, Ergocalciferol, (DRISDOL) 1.25 MG (50000 UT) CAPS capsule  Mixed hyperlipidemia  Class 1 obesity with serious comorbidity and body mass index (BMI) of 32.0 to 32.9 in adult, unspecified obesity type  PLAN:  Vitamin D Deficiency Lori Shaw was informed that low vitamin D levels contributes to fatigue and are associated with obesity, breast, and colon cancer. She agrees to continue to take OTC Vit D and start prescription Vit D @50 ,000 IU every week #4 with no refills and will follow up for routine testing of vitamin D, at least 2-3 times per year. She was informed of the risk of over-replacement of vitamin D and agrees to not increase her dose unless she discusses this with Lori Shaw first. Lori Shaw agrees to follow up with our clinic in 2 weeks.  Hyperlipidemia (mixed) Lori Shaw was informed of the American Heart Association Guidelines emphasizing intensive lifestyle modifications as the first line treatment for hyperlipidemia. We discussed many lifestyle modifications today in depth, and Lori Shaw will continue to work on decreasing saturated fats such as fatty red meat, butter and many fried foods. She will also increase vegetables and lean protein in her diet and continue to work on exercise and weight loss efforts. We will recheck labs in 3 months and will reassess her CVD risk. Lori Shaw will follow up with our clinic in 2 weeks.  Obesity Lori Shaw is currently in the action stage of change. As such, her goal is to continue with weight loss  efforts She has agreed to follow the Category 2 plan Lori Shaw has been instructed to work up to a goal of 150 minutes of combined cardio and strengthening exercise per week for weight loss and overall health benefits. We discussed the following Behavioral Modification Strategies today: increasing lean protein intake, decreasing simple carbohydrates , work on meal planning and easy cooking plans and travel eating strategies   Lori Shaw has agreed to follow up with our clinic in 2 weeks. She was informed of the importance of frequent follow up visits to maximize her success with intensive lifestyle modifications for her multiple health conditions.   OBESITY BEHAVIORAL INTERVENTION VISIT  Today's visit was # 2   Starting weight: 203 lbs Starting date: 11/25/2017 Today's weight : 197 lbs Today's date: 12/14/2017 Total lbs lost to date: 6 At least 15 minutes were spent on discussing the following behavioral intervention visit.   ASK: We discussed the diagnosis of obesity with [place] today and Lori Shaw agreed to give Lori Shaw permission to discuss obesity behavioral modification therapy today.  ASSESS: Lori Shaw has the diagnosis of obesity and her BMI today is 83.78 Lori Shaw is in the action stage of change   ADVISE: Lori Shaw was educated on the multiple health risks of obesity as well as the benefit of weight loss to improve her health. She was advised of the need for long term treatment and the importance of lifestyle modifications to improve her current health and to decrease her risk of future health problems.  AGREE: Multiple dietary modification options and treatment options were discussed and  Lori Shaw agreed to follow the recommendations documented in the above note.  ARRANGE: Lori Shaw was educated on the importance of frequent visits to treat obesity as outlined per CMS and USPSTF guidelines and agreed to schedule her next follow up appointment today.  Corey Skains, am acting as Location manager for [employer]  Leafy Ro, MD  I have reviewed the above documentation for accuracy and completeness, and I agree with the above. -Dennard Nip, MD

## 2017-12-27 ENCOUNTER — Ambulatory Visit (INDEPENDENT_AMBULATORY_CARE_PROVIDER_SITE_OTHER): Payer: Medicare HMO | Admitting: Family Medicine

## 2017-12-27 VITALS — BP 122/64 | HR 61 | Ht 65.0 in | Wt 198.0 lb

## 2017-12-27 DIAGNOSIS — E7849 Other hyperlipidemia: Secondary | ICD-10-CM

## 2017-12-27 DIAGNOSIS — E669 Obesity, unspecified: Secondary | ICD-10-CM

## 2017-12-27 DIAGNOSIS — E559 Vitamin D deficiency, unspecified: Secondary | ICD-10-CM

## 2017-12-27 DIAGNOSIS — Z6832 Body mass index (BMI) 32.0-32.9, adult: Secondary | ICD-10-CM | POA: Diagnosis not present

## 2017-12-29 NOTE — Progress Notes (Signed)
Office: [phone]  /  Fax: [phone]   HPI:   Chief Complaint: Lori Shaw is here to discuss her progress with her obesity treatment plan. She is on the Category 2 plan and is following her eating plan approximately 85 to 90% of the time. She states she is exercising 0 minutes 0 times per week. Lori Shaw is eating all of the food on her Category 2 plan. Lori Shaw has no polyphagia. She is doing some chair exercises. Her weight is 198 lb (89.8 kg) today and has had a weight gain of 1 pound over a period of 2 weeks since her last visit. She has lost 5 lbs since starting treatment with [name].  Vitamin D deficiency Lori Shaw has a diagnosis of vitamin D deficiency. Lori Shaw is currently taking vit D and she is not at goal. Her vitamin D level was at 24.4 on 11/25/17. She denies nausea, vomiting or muscle weakness.  Hyperlipidemia Lori Shaw has hyperlipidemia and she is attempting to control her cholesterol levels with intensive lifestyle modification including a low saturated fat diet, exercise and weight loss. She is not on a statin.  She denies any chest pain, shortness of breath or claudication. Her ASCVD risk score is 9.5%.  ALLERGIES: Allergies  Allergen Reactions  . Sulfa Antibiotics Nausea Only    MEDICATIONS: Current Outpatient Medications on File Prior to Visit  Medication Sig Dispense Refill  . Cholecalciferol (VITAMIN D) 2000 units CAPS Take 1 capsule by mouth daily.    . Cyanocobalamin (VITAMIN B-12) 2500 MCG SUBL Place 1 tablet (2,500 mcg total) under the tongue daily.  0  . fluticasone (FLONASE) 50 MCG/ACT nasal spray Place 2 sprays into both nostrils daily as needed. 16 g 11  . Vitamin D, Ergocalciferol, (DRISDOL) 1.25 MG (50000 UT) CAPS capsule Take 1 capsule (50,000 Units total) by mouth every 7 (seven) days. 4 capsule 0   No current facility-administered medications on file prior to visit.     PAST MEDICAL HISTORY: Past Medical History:  Diagnosis Date  .  B12 deficiency   . Back pain   . CLL (chronic lymphocytic leukemia) ([place]) 06/21/2016   Rai stage 0, dx 06/2016 (Dr Janese Banks)  . HLD (hyperlipidemia)   . Joint pain   . Knee problem   . OSA on CPAP 2006, 04/2014   AHI of 23.8 and an RDI of 42.9, on CPAP 9 cm H2O (Dohmeier)  . Osteopenia 08/2010   T -1.4 at L femur neck  . Piriformis syndrome 12/14/2014  . Vitamin D deficiency     PAST SURGICAL HISTORY: Past Surgical History:  Procedure Laterality Date  . ABDOMINAL HYSTERECTOMY  01/27/1983   for menometrorrhagia, one ovary remained  . COLONOSCOPY  2012   no records available. per patient normal.   . dexa  08/2010   osteopenia  . TONSILLECTOMY  01/26/1973    SOCIAL HISTORY: Social History   Tobacco Use  . Smoking status: Former Smoker    Packs/day: 2.00    Years: 20.00    Pack years: 40.00    Types: Cigarettes    Last attempt to quit: 01/26/1978    Years since quitting: 39.9  . Smokeless tobacco: Never Used  Substance Use Topics  . Alcohol use: No  . Drug use: Yes    Comment: occassionally glass of wine    FAMILY HISTORY: Family History  Problem Relation Age of Onset  . Hyperlipidemia Mother   . CAD Mother        CABG  .  Hypertension Mother   . Hyperlipidemia Father   . CAD Father        MI  . Sudden death Father   . Arthritis Maternal Grandmother   . Cancer Sister        breast  . Breast cancer Sister 64  . Alzheimer's disease Unknown   . Heart attack Unknown   . Diabetes Neg Hx   . Stroke Neg Hx     ROS: Review of Systems  Constitutional: Negative for weight loss.  Respiratory: Negative for shortness of breath.   Cardiovascular: Negative for chest pain and claudication.  Gastrointestinal: Negative for nausea and vomiting.  Musculoskeletal:       Negative for muscle weakness  Endo/Heme/Allergies:       Negative for polyphagia    PHYSICAL EXAM: Blood pressure 122/64, pulse 61, height 5\' 5"  (1.651 m), weight 198 lb (89.8 kg), SpO2 99 %. Body mass index  is 32.95 kg/m. Physical Exam  Constitutional: She is oriented to person, place, and time. She appears well-developed and well-nourished.  Cardiovascular: Normal rate.  Pulmonary/Chest: Effort normal.  Musculoskeletal: Normal range of motion.  Neurological: She is oriented to person, place, and time.  Skin: Skin is warm and dry.  Psychiatric: She has a normal mood and affect. Her behavior is normal.  Vitals reviewed.   RECENT LABS AND TESTS: BMET    Component Value Date/Time   NA 143 11/25/2017 1208   K 4.5 11/25/2017 1208   CL 106 11/25/2017 1208   CO2 17 (L) 11/25/2017 1208   GLUCOSE 78 11/25/2017 1208   GLUCOSE 96 07/13/2017 1400   BUN 10 11/25/2017 1208   CREATININE 0.73 11/25/2017 1208   CALCIUM 9.3 11/25/2017 1208   GFRNONAA 83 11/25/2017 1208   GFRAA 96 11/25/2017 1208   Lab Results  Component Value Date   HGBA1C 5.5 11/25/2017   Lab Results  Component Value Date   INSULIN 6.6 11/25/2017   CBC    Component Value Date/Time   WBC 14.3 (H) 11/25/2017 1208   WBC 17.1 (H) 07/06/2017 1422   RBC 4.86 11/25/2017 1208   RBC 4.90 07/06/2017 1422   HGB 15.1 11/25/2017 1208   HGB 14.5 04/10/2010   HCT 44.8 11/25/2017 1208   PLT 202 07/06/2017 1422   MCV 92 11/25/2017 1208   MCH 31.1 11/25/2017 1208   MCH 31.1 07/06/2017 1422   MCHC 33.7 11/25/2017 1208   MCHC 34.2 07/06/2017 1422   RDW 13.5 11/25/2017 1208   LYMPHSABS 8.7 (H) 11/25/2017 1208   MONOABS 0.8 07/06/2017 1422   EOSABS 0.3 11/25/2017 1208   BASOSABS 0.1 11/25/2017 1208   Iron/TIBC/Ferritin/ %Sat No results found for: IRON, TIBC, FERRITIN, IRONPCTSAT Lipid Panel     Component Value Date/Time   CHOL 229 (H) 11/25/2017 1208   TRIG 184 (H) 11/25/2017 1208   TRIG 152 04/10/2010   HDL 47 11/25/2017 1208   CHOLHDL 5 07/13/2017 1400   VLDL 46.0 (H) 07/13/2017 1400   LDLCALC 145 (H) 11/25/2017 1208   LDLCALC 67 04/10/2010   LDLDIRECT 141.0 07/13/2017 1400   Hepatic Function Panel     Component  Value Date/Time   PROT 6.3 11/25/2017 1208   ALBUMIN 4.2 11/25/2017 1208   AST 20 11/25/2017 1208   AST 18 04/10/2010   ALT 22 11/25/2017 1208   ALKPHOS 74 11/25/2017 1208   ALKPHOS 79 04/10/2010   BILITOT 0.5 11/25/2017 1208      Component Value Date/Time   TSH 2.510  11/25/2017 1208   TSH 3.46 06/19/2016 0808   TSH 1.28 09/24/2015 0831    Ref. Range 11/25/2017 12:08  Vitamin D, 25-Hydroxy Latest Ref Range: 30.0 - 100.0 ng/mL 24.4 (L)   ASSESSMENT AND PLAN: Vitamin D deficiency  Other hyperlipidemia  Class 1 obesity with serious comorbidity and body mass index (BMI) of 32.0 to 32.9 in adult, unspecified obesity type  PLAN:  Vitamin D Deficiency Lori Shaw was informed that low vitamin D levels contributes to fatigue and are associated with obesity, breast, and colon cancer. She agrees to continue to take prescription Vit D @50 ,000 IU every week and OTC Vit D 2,000 IU daily and will follow up for routine testing of vitamin D, at least 2-3 times per year. She was informed of the risk of over-replacement of vitamin D and agrees to not increase her dose unless she discusses this with [name] first. We will recheck vitamin D level in 2 months and Lori Shaw agrees to follow up with our clinic in 2 weeks.  Hyperlipidemia Lori Shaw was informed of the American Heart Association Guidelines emphasizing intensive lifestyle modifications as the first line treatment for hyperlipidemia. We discussed many lifestyle modifications today in depth, and Lori Shaw will continue to work on decreasing saturated fats such as fatty red meat, butter and many fried foods. She will also increase vegetables and lean protein in her diet and continue to work on exercise and weight loss efforts. Lori Shaw will continue with the Category 2 meal plan and we will recheck fasting lipid panel in 2 months.  Obesity Lori Shaw is currently in the action stage of change. As such, her goal is to continue with weight loss efforts She  has agreed to follow the Category 2 plan Lori Shaw has been instructed to work up to a goal of 150 minutes of combined cardio and strengthening exercise per week for weight loss and overall health benefits. We discussed the following Behavioral Modification Strategies today: better snacking choices and planning for success  Lori Shaw has agreed to follow up with our clinic in 2 weeks. She was informed of the importance of frequent follow up visits to maximize her success with intensive lifestyle modifications for her multiple health conditions.   OBESITY BEHAVIORAL INTERVENTION VISIT  Today's visit was # 3  Starting weight: 203 lbs Starting date: 11/25/2017 Today's weight : 198 lbs  Today's date: 12/27/2017 Total lbs lost to date: 5 At least 15 minutes were spent on discussing the following behavioral intervention visit.   ASK: We discussed the diagnosis of obesity with [place] today and Lori Shaw agreed to give [name] permission to discuss obesity behavioral modification therapy today.  ASSESS: Lori Shaw has the diagnosis of obesity and her BMI today is 69.95 Lori Shaw is in the action stage of change   ADVISE: Lori Shaw was educated on the multiple health risks of obesity as well as the benefit of weight loss to improve her health. She was advised of the need for long term treatment and the importance of lifestyle modifications to improve her current health and to decrease her risk of future health problems.  AGREE: Multiple dietary modification options and treatment options were discussed and  Lori Shaw agreed to follow the recommendations documented in the above note.  ARRANGE: Lori Shaw was educated on the importance of frequent visits to treat obesity as outlined per CMS and USPSTF guidelines and agreed to schedule her next follow up appointment today.  Corey Skains, am acting as Location manager for [employer], FNP-C.  I have reviewed  the above documentation for  accuracy and completeness, and I agree with the above.  - Kael Keetch, FNP-C.

## 2017-12-30 ENCOUNTER — Encounter (INDEPENDENT_AMBULATORY_CARE_PROVIDER_SITE_OTHER): Payer: Self-pay | Admitting: Family Medicine

## 2017-12-30 ENCOUNTER — Encounter: Payer: Self-pay | Admitting: Family Medicine

## 2017-12-30 ENCOUNTER — Ambulatory Visit (INDEPENDENT_AMBULATORY_CARE_PROVIDER_SITE_OTHER): Payer: Medicare HMO | Admitting: Family Medicine

## 2017-12-30 VITALS — BP 124/72 | HR 64 | Temp 97.6°F | Ht 65.5 in | Wt 199.5 lb

## 2017-12-30 DIAGNOSIS — G5702 Lesion of sciatic nerve, left lower limb: Secondary | ICD-10-CM

## 2017-12-30 MED ORDER — METHOCARBAMOL 500 MG PO TABS: 250.0000 mg | ORAL_TABLET | Freq: Two times a day (BID) | ORAL | 0 refills | Status: DC | PRN

## 2017-12-30 NOTE — Assessment & Plan Note (Signed)
Anticipate L piriformis syndrome/sciatica. Reviewed with patient and husband. rec aleve 440mg  bid x 1 wk with meals, robaxin muscle relaxant, ice/heat. Provided with exercises from [facility] pt advisor. Update if not improving for PT referral. If persistent, low threshold to check imaging study in CLL hx.

## 2017-12-30 NOTE — Progress Notes (Signed)
BP 124/72 (BP Location: Left Arm, Patient Position: Sitting, Cuff Size: Normal)   Pulse 64   Temp 97.6 F (36.4 C) (Oral)   Ht 5' 5.5" (1.664 m)   Wt 199 lb 8 oz (90.5 kg)   SpO2 98%   BMI 32.69 kg/m    CC: L buttock pain Subjective:    Patient ID: Lori Shaw, female    DOB: [birth year], 72 y.o.   MRN: [id number]  HPI: Lori Shaw is a 72 y.o. female presenting on 12/30/2017 for Back Pain (C/o left side low back pain in left buttock. Pain radiates down posterior leg to the knee. States pain is worse when first going to bed, then again around noon. Started about 2 wks ago. Tried Aleve and heating pad, helpful. )   2 wk h/o L leg pain that starts L buttock and travels posteriorly down thigh to knee. Worse at night, worse with prolonged standing.   No inciting trauma/injury, fevers/chills, numbness or weakness of legs, saddle anesthesia, bowel/bladder incontinence.  So far has tried aleve, heating pad with benefit.  No prior back trouble.   [facility]ing [facility].   Relevant past medical, surgical, family and social history reviewed and updated as indicated. Interim medical history since our last visit reviewed. Allergies and medications reviewed and updated. Outpatient Medications Prior to Visit  Medication Sig Dispense Refill  . Cholecalciferol (VITAMIN D) 2000 units CAPS Take 1 capsule by mouth daily.    . Cyanocobalamin (VITAMIN B-12) 2500 MCG SUBL Place 1 tablet (2,500 mcg total) under the tongue daily.  0  . fluticasone (FLONASE) 50 MCG/ACT nasal spray Place 2 sprays into both nostrils daily as needed. 16 g 11  . Vitamin D, Ergocalciferol, (DRISDOL) 1.25 MG (50000 UT) CAPS capsule Take 1 capsule (50,000 Units total) by mouth every 7 (seven) days. 4 capsule 0   No facility-administered medications prior to visit.      Per HPI unless specifically indicated in ROS section below Review of Systems     Objective:    BP 124/72 (BP  Location: Left Arm, Patient Position: Sitting, Cuff Size: Normal)   Pulse 64   Temp 97.6 F (36.4 C) (Oral)   Ht 5' 5.5" (1.664 m)   Wt 199 lb 8 oz (90.5 kg)   SpO2 98%   BMI 32.69 kg/m   Wt Readings from Last 3 Encounters:  12/30/17 199 lb 8 oz (90.5 kg)  12/27/17 198 lb (89.8 kg)  12/14/17 197 lb (89.4 kg)    Physical Exam  Constitutional: She appears well-developed and well-nourished. No distress.  Musculoskeletal: Normal range of motion. She exhibits no edema.  No pain midline spine No paraspinous mm tenderness + SLR on left No pain with int/ext rotation at hip. Neg FABER. No pain at SIJ, GTB bilaterally.  Point tender to palpation at L sciatic notch.  Neurological: She is alert.  5/5 strength BLE  Skin: No rash noted.  Psychiatric: She has a normal mood and affect.  Nursing note and vitals reviewed.      Assessment & Plan:   Problem List Items Addressed This Visit    Piriformis syndrome of left side - Primary    Anticipate L piriformis syndrome/sciatica. Reviewed with patient and husband. rec aleve 440mg  bid x 1 wk with meals, robaxin muscle relaxant, ice/heat. Provided with exercises from [facility] pt advisor. Update if not improving for PT referral. If persistent, low threshold to check imaging study in CLL hx.  Relevant Medications   methocarbamol (ROBAXIN) 500 MG tablet       Meds ordered this encounter  Medications  . methocarbamol (ROBAXIN) 500 MG tablet    Sig: Take 0.5-1 tablets (250-500 mg total) by mouth 2 (two) times daily as needed for muscle spasms.    Dispense:  30 tablet    Refill:  0   No orders of the defined types were placed in this encounter.   Follow up plan: Return if symptoms worsen or fail to improve.  Ria Bush, MD

## 2017-12-30 NOTE — Patient Instructions (Addendum)
I think you have sciatica from piriformis syndrome.  Treat with aleve 2 pills twice daily, muscle relaxant at night time, stretching exercises, continue ice or heat. If not improving with this, let us know for PT referral.

## 2018-01-06 ENCOUNTER — Inpatient Hospital Stay: Payer: Medicare HMO | Attending: Oncology

## 2018-01-06 ENCOUNTER — Encounter: Payer: Self-pay | Admitting: Oncology

## 2018-01-06 ENCOUNTER — Other Ambulatory Visit: Payer: Self-pay

## 2018-01-06 ENCOUNTER — Inpatient Hospital Stay (HOSPITAL_BASED_OUTPATIENT_CLINIC_OR_DEPARTMENT_OTHER): Payer: Medicare HMO | Admitting: Oncology

## 2018-01-06 VITALS — BP 104/67 | HR 63 | Temp 97.5°F | Resp 18 | Ht 65.5 in | Wt 201.4 lb

## 2018-01-06 DIAGNOSIS — Z87891 Personal history of nicotine dependence: Secondary | ICD-10-CM

## 2018-01-06 DIAGNOSIS — C9110 Chronic lymphocytic leukemia of B-cell type not having achieved remission: Secondary | ICD-10-CM | POA: Insufficient documentation

## 2018-01-06 LAB — CBC WITH DIFFERENTIAL/PLATELET
ABS IMMATURE GRANULOCYTES: 0.03 10*3/uL (ref 0.00–0.07)
BASOS ABS: 0.1 10*3/uL (ref 0.0–0.1)
BASOS PCT: 1 %
EOS ABS: 0.3 10*3/uL (ref 0.0–0.5)
EOS PCT: 2 %
HEMATOCRIT: 43.5 % (ref 36.0–46.0)
Hemoglobin: 14.4 g/dL (ref 12.0–15.0)
Immature Granulocytes: 0 %
Lymphocytes Relative: 62 %
Lymphs Abs: 10.5 10*3/uL — ABNORMAL HIGH (ref 0.7–4.0)
MCH: 30.2 pg (ref 26.0–34.0)
MCHC: 33.1 g/dL (ref 30.0–36.0)
MCV: 91.2 fL (ref 80.0–100.0)
MONOS PCT: 5 %
Monocytes Absolute: 0.8 10*3/uL (ref 0.1–1.0)
NEUTROS ABS: 4.9 10*3/uL (ref 1.7–7.7)
NEUTROS PCT: 30 %
Platelets: 189 10*3/uL (ref 150–400)
RBC: 4.77 MIL/uL (ref 3.87–5.11)
RDW: 13.8 % (ref 11.5–15.5)
WBC: 16.6 10*3/uL — AB (ref 4.0–10.5)
nRBC: 0 % (ref 0.0–0.2)

## 2018-01-07 NOTE — Progress Notes (Signed)
Hematology/Oncology Consult note [facility]  Telephone:([phone] Fax:([phone]) [phone]  Patient Care Team: Ria Bush, MD as PCP - General (Family Medicine) Solon Augusta, MD as Attending Physician (Ophthalmology) Elbert Ewings, DDS as Consulting Physician (Dentistry)   Name of the patient: Lori Shaw  [id number]  [birth year]   Date of visit: 01/07/18  Diagnosis- Rai stage 0 CLL  Chief complaint/ Reason for visit- routine f/u of CLL  Heme/Onc history: patient is a 72 year old female with no significant past medical history other than low B12 and low vitamin D levels. Patient was recently noted to have leukocytosis with a white count of 13. Differential mainly showed lymphocytosis. Recent CBC from 07/03/2016 showed white count of 13, H&H of 14.7/44 and a platelet count of 207. Differential showed 63.5% lymphocytes. Parapharyngeal flow cytometry showed CD5 positive, CD23 positive clonal B-cell population CLL/SLL phenotype. She has been referred to [name] for the same. Overall patient is doing well and denies any symptoms of fevers, chills, fatigue, drenching night sweats or unintentional weight loss. Denies any lumps or bumps anywhere or recurrent infections.   Interval history- her appetite is good. No unintentional weight loss or night sweats. Denies any lumps/bumps anywhere. Has mild chronic fatigue  ECOG PS- 1 Pain scale- 0 Opioid associated constipation- no  Review of systems- Review of Systems  Constitutional: Positive for malaise/fatigue. Negative for chills, fever and weight loss.  HENT: Negative for congestion, ear discharge and nosebleeds.   Eyes: Negative for blurred vision.  Respiratory: Negative for cough, hemoptysis, sputum production, shortness of breath and wheezing.   Cardiovascular: Negative for chest pain, palpitations, orthopnea and claudication.  Gastrointestinal: Negative for abdominal pain, blood in stool, constipation,  diarrhea, heartburn, melena, nausea and vomiting.  Genitourinary: Negative for dysuria, flank pain, frequency, hematuria and urgency.  Musculoskeletal: Negative for back pain, joint pain and myalgias.  Skin: Negative for rash.  Neurological: Negative for dizziness, tingling, focal weakness, seizures, weakness and headaches.  Endo/Heme/Allergies: Does not bruise/bleed easily.  Psychiatric/Behavioral: Negative for depression and suicidal ideas. The patient does not have insomnia.       Allergies  Allergen Reactions  . Sulfa Antibiotics Nausea Only     Past Medical History:  Diagnosis Date  . B12 deficiency   . Back pain   . CLL (chronic lymphocytic leukemia) ([place]) 06/21/2016   Rai stage 0, dx 06/2016 (Dr Janese Banks)  . HLD (hyperlipidemia)   . Joint pain   . Knee problem   . OSA on CPAP 2006, 04/2014   AHI of 23.8 and an RDI of 42.9, on CPAP 9 cm H2O (Dohmeier)  . Osteopenia 08/2010   T -1.4 at L femur neck  . Piriformis syndrome 12/14/2014  . Vitamin D deficiency      Past Surgical History:  Procedure Laterality Date  . ABDOMINAL HYSTERECTOMY  01/27/1983   for menometrorrhagia, one ovary remained  . COLONOSCOPY  2012   no records available. per patient normal.   . dexa  08/2010   osteopenia  . TONSILLECTOMY  01/26/1973    Social History   Socioeconomic History  . Marital status: Married    Spouse name: Joselynne Killam  . Number of children: 2  . Years of education: 1.5  . Highest education level: Not on file  Occupational History  . 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 Financial resource strain: Not on file  . Food insecurity:    Worry: Not on file  Inability: Not on file  . Transportation needs:    Medical: Not on file    Non-medical: Not on file  Tobacco Use  . Smoking status: Former Smoker    Packs/day: 2.00    Years: 20.00    Pack years: 40.00    Types: Cigarettes    Last attempt to quit: 01/26/1978    Years since  quitting: 39.9  . Smokeless tobacco: Never Used  Substance and Sexual Activity  . Alcohol use: No  . Drug use: Yes    Comment: occassionally glass of wine  . Sexual activity: Yes  Lifestyle  . Physical activity:    Days per week: Not on file    Minutes per session: Not on file  . Stress: Not on file  Relationships  . Social connections:    Talks on phone: Not on file    Gets together: Not on file    Attends religious service: Not on file    Active member of club or organization: Not on file    Attends meetings of clubs or organizations: Not on file    Relationship status: Not on file  . Intimate partner violence:    Fear of current or ex partner: Not on file    Emotionally abused: Not on file    Physically abused: Not on file    Forced sexual activity: Not on file  Other Topics Concern  . Not on file  Social History Narrative   Lives with daughter, 1 dog   Occupation: retired, has worked in Chief Strategy Officer, also Forensic psychologist   Edu: 1.5 yrs college   Activity: square dancing   Diet: good water, fruits/vegetables daily   Patient drinks two cups of caffeine daily.   Remarried.      Family History  Problem Relation Age of Onset  . Hyperlipidemia Mother   . CAD Mother        CABG  . Hypertension Mother   . Hyperlipidemia Father   . CAD Father        MI  . Sudden death Father   . Arthritis Maternal Grandmother   . Cancer Sister        breast  . Breast cancer Sister 80  . Alzheimer's disease Unknown   . Heart attack Unknown   . Diabetes Neg Hx   . Stroke Neg Hx      Current Outpatient Medications:  .  Cholecalciferol (VITAMIN D) 2000 units CAPS, Take 1 capsule by mouth daily., Disp: , Rfl:  .  Cyanocobalamin (VITAMIN B-12) 2500 MCG SUBL, Place 1 tablet (2,500 mcg total) under the tongue daily., Disp: , Rfl: 0 .  Vitamin D, Ergocalciferol, (DRISDOL) 1.25 MG (50000 UT) CAPS capsule, Take 1 capsule (50,000 Units total) by mouth every 7 (seven) days., Disp: 4  capsule, Rfl: 0 .  fluticasone (FLONASE) 50 MCG/ACT nasal spray, Place 2 sprays into both nostrils daily as needed. (Patient not taking: Reported on 01/06/2018), Disp: 16 g, Rfl: 11 .  methocarbamol (ROBAXIN) 500 MG tablet, Take 0.5-1 tablets (250-500 mg total) by mouth 2 (two) times daily as needed for muscle spasms. (Patient not taking: Reported on 01/06/2018), Disp: 30 tablet, Rfl: 0  Physical exam:  Vitals:   01/06/18 1411  BP: 104/67  Pulse: 63  Resp: 18  Temp: (!) 97.5 F (36.4 C)  TempSrc: Tympanic  SpO2: 96%  Weight: 201 lb 6.4 oz (91.4 kg)  Height: 5' 5.5" (1.664 m)   Physical Exam Constitutional:  General: She is not in acute distress. HENT:     Head: Normocephalic and atraumatic.  Eyes:     Pupils: Pupils are equal, round, and reactive to light.  Neck:     Musculoskeletal: Normal range of motion.  Cardiovascular:     Rate and Rhythm: Normal rate and regular rhythm.     Heart sounds: Normal heart sounds.  Pulmonary:     Effort: Pulmonary effort is normal.     Breath sounds: Normal breath sounds.  Abdominal:     General: Bowel sounds are normal.     Palpations: Abdomen is soft.  Lymphadenopathy:     Comments: No palpable cervical, supraclavicular, axillary or inguinal adenopathy   Skin:    General: Skin is warm and dry.  Neurological:     Mental Status: She is alert and oriented to person, place, and time.      CMP Latest Ref Rng & Units 11/25/2017  Glucose 65 - 99 mg/dL 78  BUN 8 - 27 mg/dL 10  Creatinine 0.57 - 1.00 mg/dL 0.73  Sodium 134 - 144 mmol/L 143  Potassium 3.5 - 5.2 mmol/L 4.5  Chloride 96 - 106 mmol/L 106  CO2 20 - 29 mmol/L 17(L)  Calcium 8.7 - 10.3 mg/dL 9.3  Total Protein 6.0 - 8.5 g/dL 6.3  Total Bilirubin 0.0 - 1.2 mg/dL 0.5  Alkaline Phos 39 - 117 IU/L 74  AST 0 - 40 IU/L 20  ALT 0 - 32 IU/L 22   CBC Latest Ref Rng & Units 01/06/2018  WBC 4.0 - 10.5 K/uL 16.6(H)  Hemoglobin 12.0 - 15.0 g/dL 14.4  Hematocrit 36.0 - 46.0 %  43.5  Platelets 150 - 400 K/uL 189      Assessment and plan- Patient is a 72 y.o. female with Rai Stage 0 CLL currently under observation.  Wbc is stable around 15-19. Absolute lymphocyte count stable around 10. No B symptoms. No palpable adenopathy or significant splenomegaly. Her cll does not require any treatment at this time. Repeat cbc with diff in 6 months and 1 year and I will see her back in 1 year   Visit Diagnosis 1. CLL (chronic lymphocytic leukemia) ([place])      Dr. Randa Evens, MD, MPH [facility] at [facility] [phone] 01/07/2018 10:43 AM

## 2018-01-08 ENCOUNTER — Other Ambulatory Visit (INDEPENDENT_AMBULATORY_CARE_PROVIDER_SITE_OTHER): Payer: Self-pay | Admitting: Family Medicine

## 2018-01-08 DIAGNOSIS — E559 Vitamin D deficiency, unspecified: Secondary | ICD-10-CM

## 2018-01-10 ENCOUNTER — Ambulatory Visit (INDEPENDENT_AMBULATORY_CARE_PROVIDER_SITE_OTHER): Payer: Medicare HMO | Admitting: Family Medicine

## 2018-01-10 ENCOUNTER — Encounter (INDEPENDENT_AMBULATORY_CARE_PROVIDER_SITE_OTHER): Payer: Self-pay | Admitting: Family Medicine

## 2018-01-10 VITALS — BP 116/65 | HR 89 | Temp 97.8°F | Ht 65.0 in | Wt 195.0 lb

## 2018-01-10 DIAGNOSIS — E7849 Other hyperlipidemia: Secondary | ICD-10-CM | POA: Diagnosis not present

## 2018-01-10 DIAGNOSIS — Z6832 Body mass index (BMI) 32.0-32.9, adult: Secondary | ICD-10-CM

## 2018-01-10 DIAGNOSIS — E669 Obesity, unspecified: Secondary | ICD-10-CM | POA: Diagnosis not present

## 2018-01-10 DIAGNOSIS — E559 Vitamin D deficiency, unspecified: Secondary | ICD-10-CM

## 2018-01-10 MED ORDER — VITAMIN D (ERGOCALCIFEROL) 1.25 MG (50000 UNIT) PO CAPS: 50000.0000 [IU] | ORAL_CAPSULE | ORAL | 0 refills | Status: DC

## 2018-01-10 NOTE — Progress Notes (Signed)
Office: [phone]  /  Fax: [phone]   HPI:   Chief Complaint: Lori Shaw is here to discuss her progress with her obesity treatment plan. She is on the Category 2 plan and is following her eating plan approximately 100 % of the time. She states she is exercising 0 minutes 0 times per week. Lori Shaw gets in all of her food on most days. It is a struggle to get in the food. She denies polyphagia.  Her weight is 195 lb (88.5 kg) today and has had a weight loss of 3 pounds over a period of 2 weeks since her last visit. She has lost 8 lbs since starting treatment with [name].  Vitamin D deficiency Lori Shaw has a diagnosis of vitamin D deficiency. She is currently taking vit D and is not at goal. Her last vitamin D level was 24.4 on 11/25/17. She admits fatigue and denies nausea, vomiting, or muscle weakness.  Hyperlipidemia Lori Shaw has hyperlipidemia and has been trying to improve her cholesterol levels with intensive lifestyle modification including a low saturated fat diet, exercise and weight loss. She is not on a statin currently, but has been on one in the past. Her ASCVD risk score is 9.5 %. Her last LDL was 145 on 11/25/17. She denies any chest pain or shortness of breath.  ASSESSMENT AND PLAN:  Vitamin D deficiency - Plan: Vitamin D, Ergocalciferol, (DRISDOL) 1.25 MG (50000 UT) CAPS capsule  Other hyperlipidemia  Class 1 obesity with serious comorbidity and body mass index (BMI) of 32.0 to 32.9 in adult, unspecified obesity type  PLAN:  Vitamin D Deficiency Lori Shaw was informed that low vitamin D levels contributes to fatigue and are associated with obesity, breast, and colon cancer. She agrees to continue to take prescription Vit D @50 ,000 IU every week #4 with no refills and will follow up for routine testing of vitamin D, at least 2-3 times per year. She was informed of the risk of over-replacement of vitamin D and agrees to not increase her dose unless she discusses  this with [name] first. Lori Shaw agrees to follow up in 3 weeks.  Hyperlipidemia Lori Shaw was informed of the American Heart Association Guidelines emphasizing intensive lifestyle modifications as the first line treatment for hyperlipidemia. We discussed many lifestyle modifications today in depth, and Lori Shaw will continue to work on decreasing saturated fats such as fatty red meat, butter and many fried foods. She will also increase vegetables and lean protein in her diet and continue to work on exercise and weight loss efforts.   Obesity Lori Shaw is currently in the action stage of change. As such, her goal is to continue with weight loss efforts. She has agreed to follow the Category 2 plan. We discussed the following Behavioral Modification Strategies today: increasing lean protein intake, work on meal planning and easy cooking plans, holiday eating strategies, and planning for success.   Lori Shaw has agreed to follow up with our clinic in 3 weeks. She was informed of the importance of frequent follow up visits to maximize her success with intensive lifestyle modifications for her multiple health conditions.  ALLERGIES: Allergies  Allergen Reactions  . Sulfa Antibiotics Nausea Only    MEDICATIONS: Current Outpatient Medications on File Prior to Visit  Medication Sig Dispense Refill  . Cholecalciferol (VITAMIN D) 2000 units CAPS Take 1 capsule by mouth daily.    . Cyanocobalamin (VITAMIN B-12) 2500 MCG SUBL Place 1 tablet (2,500 mcg total) under the tongue daily.  0  . fluticasone (FLONASE)  50 MCG/ACT nasal spray Place 2 sprays into both nostrils daily as needed. 16 g 11  . methocarbamol (ROBAXIN) 500 MG tablet Take 0.5-1 tablets (250-500 mg total) by mouth 2 (two) times daily as needed for muscle spasms. 30 tablet 0   No current facility-administered medications on file prior to visit.     PAST MEDICAL HISTORY: Past Medical History:  Diagnosis Date  . B12 deficiency   . Back pain    . CLL (chronic lymphocytic leukemia) ([place]) 06/21/2016   Rai stage 0, dx 06/2016 (Dr Janese Banks)  . HLD (hyperlipidemia)   . Joint pain   . Knee problem   . OSA on CPAP 2006, 04/2014   AHI of 23.8 and an RDI of 42.9, on CPAP 9 cm H2O (Dohmeier)  . Osteopenia 08/2010   T -1.4 at L femur neck  . Piriformis syndrome 12/14/2014  . Vitamin D deficiency     PAST SURGICAL HISTORY: Past Surgical History:  Procedure Laterality Date  . ABDOMINAL HYSTERECTOMY  01/27/1983   for menometrorrhagia, one ovary remained  . COLONOSCOPY  2012   no records available. per patient normal.   . dexa  08/2010   osteopenia  . TONSILLECTOMY  01/26/1973    SOCIAL HISTORY: Social History   Tobacco Use  . Smoking status: Former Smoker    Packs/day: 2.00    Years: 20.00    Pack years: 40.00    Types: Cigarettes    Last attempt to quit: 01/26/1978    Years since quitting: 39.9  . Smokeless tobacco: Never Used  Substance Use Topics  . Alcohol use: No  . Drug use: Yes    Comment: occassionally glass of wine    FAMILY HISTORY: Family History  Problem Relation Age of Onset  . Hyperlipidemia Mother   . CAD Mother        CABG  . Hypertension Mother   . Hyperlipidemia Father   . CAD Father        MI  . Sudden death Father   . Arthritis Maternal Grandmother   . Cancer Sister        breast  . Breast cancer Sister 30  . Alzheimer's disease Unknown   . Heart attack Unknown   . Diabetes Neg Hx   . Stroke Neg Hx     ROS: Review of Systems  Constitutional: Positive for malaise/fatigue and weight loss.  Respiratory: Negative for shortness of breath.   Cardiovascular: Negative for chest pain.  Gastrointestinal: Negative for nausea and vomiting.  Musculoskeletal:       Negative for muscle weakness.  Endo/Heme/Allergies:       Negative for polyphagia.    PHYSICAL EXAM: Blood pressure 116/65, pulse 89, temperature 97.8 F (36.6 C), temperature source Oral, height 5\' 5"  (1.651 m), weight 195 lb (88.5  kg), SpO2 98 %. Body mass index is 32.45 kg/m. Physical Exam Vitals signs reviewed.  Constitutional:      Appearance: Normal appearance. She is obese.  Cardiovascular:     Rate and Rhythm: Normal rate.  Pulmonary:     Effort: Pulmonary effort is normal.  Musculoskeletal: Normal range of motion.  Skin:    General: Skin is warm and dry.  Neurological:     Mental Status: She is alert and oriented to person, place, and time.  Psychiatric:        Mood and Affect: Mood normal.        Behavior: Behavior normal.     RECENT LABS AND TESTS:  BMET    Component Value Date/Time   NA 143 11/25/2017 1208   K 4.5 11/25/2017 1208   CL 106 11/25/2017 1208   CO2 17 (L) 11/25/2017 1208   GLUCOSE 78 11/25/2017 1208   GLUCOSE 96 07/13/2017 1400   BUN 10 11/25/2017 1208   CREATININE 0.73 11/25/2017 1208   CALCIUM 9.3 11/25/2017 1208   GFRNONAA 83 11/25/2017 1208   GFRAA 96 11/25/2017 1208   Lab Results  Component Value Date   HGBA1C 5.5 11/25/2017   Lab Results  Component Value Date   INSULIN 6.6 11/25/2017   CBC    Component Value Date/Time   WBC 16.6 (H) 01/06/2018 1341   RBC 4.77 01/06/2018 1341   HGB 14.4 01/06/2018 1341   HGB 15.1 11/25/2017 1208   HGB 14.5 04/10/2010   HCT 43.5 01/06/2018 1341   HCT 44.8 11/25/2017 1208   PLT 189 01/06/2018 1341   MCV 91.2 01/06/2018 1341   MCV 92 11/25/2017 1208   MCH 30.2 01/06/2018 1341   MCHC 33.1 01/06/2018 1341   RDW 13.8 01/06/2018 1341   RDW 13.5 11/25/2017 1208   LYMPHSABS 10.5 (H) 01/06/2018 1341   LYMPHSABS 8.7 (H) 11/25/2017 1208   MONOABS 0.8 01/06/2018 1341   EOSABS 0.3 01/06/2018 1341   EOSABS 0.3 11/25/2017 1208   BASOSABS 0.1 01/06/2018 1341   BASOSABS 0.1 11/25/2017 1208   Iron/TIBC/Ferritin/ %Sat No results found for: IRON, TIBC, FERRITIN, IRONPCTSAT Lipid Panel     Component Value Date/Time   CHOL 229 (H) 11/25/2017 1208   TRIG 184 (H) 11/25/2017 1208   TRIG 152 04/10/2010   HDL 47 11/25/2017 1208    CHOLHDL 5 07/13/2017 1400   VLDL 46.0 (H) 07/13/2017 1400   LDLCALC 145 (H) 11/25/2017 1208   LDLCALC 67 04/10/2010   LDLDIRECT 141.0 07/13/2017 1400   Hepatic Function Panel     Component Value Date/Time   PROT 6.3 11/25/2017 1208   ALBUMIN 4.2 11/25/2017 1208   AST 20 11/25/2017 1208   AST 18 04/10/2010   ALT 22 11/25/2017 1208   ALKPHOS 74 11/25/2017 1208   ALKPHOS 79 04/10/2010   BILITOT 0.5 11/25/2017 1208      Component Value Date/Time   TSH 2.510 11/25/2017 1208   TSH 3.46 06/19/2016 0808   TSH 1.28 09/24/2015 0831   Results for ENVI, EAGLESON (MRN [id number]) as of 01/10/2018 15:21  Ref. Range 11/25/2017 12:08  Vitamin D, 25-Hydroxy Latest Ref Range: 30.0 - 100.0 ng/mL 24.4 (L)    OBESITY BEHAVIORAL INTERVENTION VISIT  Today's visit was # 4   Starting weight: 203 lbs Starting date: 11/25/17 Today's weight : Weight: 195 lb (88.5 kg)  Today's date: 01/10/2018 Total lbs lost to date: 8 At least 15 minutes were spent on discussing the following behavioral intervention visit.  ASK: We discussed the diagnosis of obesity with Lori Shaw today and Lori Shaw agreed to give [name] permission to discuss obesity behavioral modification therapy today.  ASSESS: Lori Shaw has the diagnosis of obesity and her BMI today is 32.4. Lori Shaw is in the action stage of change.   ADVISE: Lori Shaw was educated on the multiple health risks of obesity as well as the benefit of weight loss to improve her health. She was advised of the need for long term treatment and the importance of lifestyle modifications to improve her current health and to decrease her risk of future health problems.  AGREE: Multiple dietary modification options and treatment options were discussed and Lori Shaw  agreed to follow the recommendations documented in the above note.  ARRANGE: Lori Shaw was educated on the importance of frequent visits to treat obesity as outlined per CMS and USPSTF  guidelines and agreed to schedule her next follow up appointment today.  I, Marcille Blanco, am acting as Location manager for [employer], FNP-C.  I have reviewed the above documentation for accuracy and completeness, and I agree with the above.  - Dawn Whitmire, FNP-C.

## 2018-01-14 DIAGNOSIS — G4733 Obstructive sleep apnea (adult) (pediatric): Secondary | ICD-10-CM | POA: Diagnosis not present

## 2018-01-24 ENCOUNTER — Encounter: Payer: Self-pay | Admitting: Family Medicine

## 2018-01-24 DIAGNOSIS — G5702 Lesion of sciatic nerve, left lower limb: Secondary | ICD-10-CM

## 2018-01-24 DIAGNOSIS — C9110 Chronic lymphocytic leukemia of B-cell type not having achieved remission: Secondary | ICD-10-CM

## 2018-01-24 DIAGNOSIS — M5442 Lumbago with sciatica, left side: Secondary | ICD-10-CM

## 2018-01-27 ENCOUNTER — Ambulatory Visit (INDEPENDENT_AMBULATORY_CARE_PROVIDER_SITE_OTHER)
Admission: RE | Admit: 2018-01-27 | Discharge: 2018-01-27 | Disposition: A | Discharge status: Home / Self Care | Payer: Medicare HMO | Source: Ambulatory Visit | Attending: Family Medicine | Admitting: Family Medicine

## 2018-01-27 DIAGNOSIS — M5442 Lumbago with sciatica, left side: Secondary | ICD-10-CM | POA: Diagnosis not present

## 2018-01-27 DIAGNOSIS — C9110 Chronic lymphocytic leukemia of B-cell type not having achieved remission: Secondary | ICD-10-CM | POA: Diagnosis not present

## 2018-01-27 DIAGNOSIS — M4805 Spinal stenosis, thoracolumbar region: Secondary | ICD-10-CM | POA: Diagnosis not present

## 2018-01-31 ENCOUNTER — Telehealth: Payer: Self-pay

## 2018-01-31 ENCOUNTER — Encounter (INDEPENDENT_AMBULATORY_CARE_PROVIDER_SITE_OTHER): Payer: Self-pay | Admitting: Family Medicine

## 2018-01-31 ENCOUNTER — Ambulatory Visit (INDEPENDENT_AMBULATORY_CARE_PROVIDER_SITE_OTHER): Payer: Medicare HMO | Admitting: Family Medicine

## 2018-01-31 VITALS — BP 106/69 | HR 59 | Temp 97.9°F | Ht 65.0 in | Wt 194.0 lb

## 2018-01-31 DIAGNOSIS — Z6832 Body mass index (BMI) 32.0-32.9, adult: Secondary | ICD-10-CM | POA: Diagnosis not present

## 2018-01-31 DIAGNOSIS — E559 Vitamin D deficiency, unspecified: Secondary | ICD-10-CM | POA: Diagnosis not present

## 2018-01-31 DIAGNOSIS — E669 Obesity, unspecified: Secondary | ICD-10-CM

## 2018-01-31 DIAGNOSIS — E7849 Other hyperlipidemia: Secondary | ICD-10-CM

## 2018-01-31 MED ORDER — VITAMIN D (ERGOCALCIFEROL) 1.25 MG (50000 UNIT) PO CAPS: 50000.0000 [IU] | ORAL_CAPSULE | ORAL | 0 refills | Status: DC

## 2018-01-31 NOTE — Telephone Encounter (Signed)
Results released via mychart.  Ok to proceed with PT.

## 2018-01-31 NOTE — Telephone Encounter (Signed)
Pt is requesting back xray results. Pt is supposed to start PT on 02/02/18 but needs OK from Dr Darnell Level first.pt request cb.

## 2018-02-01 ENCOUNTER — Encounter (INDEPENDENT_AMBULATORY_CARE_PROVIDER_SITE_OTHER): Payer: Self-pay | Admitting: Family Medicine

## 2018-02-01 DIAGNOSIS — Z6831 Body mass index (BMI) 31.0-31.9, adult: Secondary | ICD-10-CM

## 2018-02-01 DIAGNOSIS — E669 Obesity, unspecified: Secondary | ICD-10-CM | POA: Insufficient documentation

## 2018-02-01 NOTE — Progress Notes (Signed)
Office: [phone]  /  Fax: [phone]   HPI:   Chief Complaint: Lori Shaw is here to discuss her progress with her obesity treatment plan. She is on the Category 2 plan and is following her eating plan approximately 95 % of the time. She states she is exercising 0 minutes 0 times per week. [name] denies hunger.  Her weight is 194 lb (88 kg) today and has had a weight loss of 1 pound over a period of 3 weeks since her last visit. She has lost 9 lbs since starting treatment with [name].  Vitamin D deficiency [name] has a diagnosis of vitamin D deficiency. She is currently taking vit D and her last vitamin D level was 24.4 on 11/25/17, which is not at goal. She denies nausea, vomiting, or muscle weakness.  Hyperlipidemia [name] has hyperlipidemia and has been trying to improve her cholesterol levels with intensive lifestyle modification including a low saturated fat diet, exercise and weight loss. She is not on a statin and she denies any chest pain or shortness of breath.  ASSESSMENT AND PLAN:  Vitamin D deficiency - Plan: Vitamin D, Ergocalciferol, (DRISDOL) 1.25 MG (50000 UT) CAPS capsule  Other hyperlipidemia  Class 1 obesity with serious comorbidity and body mass index (BMI) of 32.0 to 32.9 in adult, unspecified obesity type  PLAN:  Vitamin D Deficiency [name] was informed that low vitamin D levels contributes to fatigue and are associated with obesity, breast, and colon cancer. She agrees to continue to take prescription Vit D @50 ,000 IU every week #4 with no refills and will follow up for routine testing of vitamin D, at least 2-3 times per year. She was informed of the risk of over-replacement of vitamin D and agrees to not increase her dose unless she discusses this with [name] first. [name] agreed to follow up in 2 weeks.   Hyperlipidemia [name] was informed of the American Heart Association Guidelines emphasizing intensive lifestyle modifications as the first  line treatment for hyperlipidemia. We discussed many lifestyle modifications today in depth, and [name] will continue to work on decreasing saturated fats such as fatty red meat, butter and many fried foods. She will also increase vegetables and lean protein in her diet and continue to work on exercise and weight loss efforts. [name] agrees to continue with the meal plan and to follow up as directed.  Obesity [name] is currently in the action stage of change. As such, her goal is to continue with weight loss efforts. She has agreed to follow the Category 2 plan. She was provided with the Smart Fruit handout. She may have a banana a few times per week for her fruit choice. We discussed the following Behavioral Modification Strategies today: increasing lean protein intake and planning for success.  [name] has not been prescribed exercise at this time.  [name] has agreed to follow up with our clinic in 2 weeks. She was informed of the importance of frequent follow up visits to maximize her success with intensive lifestyle modifications for her multiple health conditions.  ALLERGIES: Allergies  Allergen Reactions  . Sulfa Antibiotics Nausea Only    MEDICATIONS: Current Outpatient Medications on File Prior to Visit  Medication Sig Dispense Refill  . Cholecalciferol (VITAMIN D) 2000 units CAPS Take 1 capsule by mouth daily.    . Cyanocobalamin (VITAMIN B-12) 2500 MCG SUBL Place 1 tablet (2,500 mcg total) under the tongue daily.  0  . fluticasone (FLONASE) 50 MCG/ACT nasal spray Place 2 sprays into both  nostrils daily as needed. 16 g 11  . methocarbamol (ROBAXIN) 500 MG tablet Take 0.5-1 tablets (250-500 mg total) by mouth 2 (two) times daily as needed for muscle spasms. 30 tablet 0   No current facility-administered medications on file prior to visit.     PAST MEDICAL HISTORY: Past Medical History:  Diagnosis Date  . B12 deficiency   . Back pain   . CLL (chronic lymphocytic  leukemia) ([place]) 06/21/2016   Rai stage 0, dx 06/2016 (Dr Janese Banks)  . HLD (hyperlipidemia)   . Joint pain   . Knee problem   . OSA on CPAP 2006, 04/2014   AHI of 23.8 and an RDI of 42.9, on CPAP 9 cm H2O ([facility])  . Osteopenia 08/2010   T -1.4 at L femur neck  . Piriformis syndrome 12/14/2014  . Vitamin D deficiency     PAST SURGICAL HISTORY: Past Surgical History:  Procedure Laterality Date  . ABDOMINAL HYSTERECTOMY  01/27/1983   for menometrorrhagia, one ovary remained  . COLONOSCOPY  2012   no records available. per patient normal.   . dexa  08/2010   osteopenia  . TONSILLECTOMY  01/26/1973    SOCIAL HISTORY: Social History   Tobacco Use  . Smoking status: Former Smoker    Packs/day: 2.00    Years: 20.00    Pack years: 40.00    Types: Cigarettes    Last attempt to quit: 01/26/1978    Years since quitting: 40.0  . Smokeless tobacco: Never Used  Substance Use Topics  . Alcohol use: No  . Drug use: Yes    Comment: occassionally glass of wine    FAMILY HISTORY: Family History  Problem Relation Age of Onset  . Hyperlipidemia Mother   . CAD Mother        CABG  . Hypertension Mother   . Hyperlipidemia Father   . CAD Father        MI  . Sudden death Father   . Arthritis Maternal Grandmother   . Cancer Sister        breast  . Breast cancer Sister 53  . Alzheimer's disease Unknown   . Heart attack Unknown   . Diabetes Neg Hx   . Stroke Neg Hx     ROS: Review of Systems  Constitutional: Positive for weight loss.  Respiratory: Negative for shortness of breath.   Cardiovascular: Negative for chest pain.  Gastrointestinal: Negative for nausea and vomiting.  Musculoskeletal:       Negative for muscle weakness.   PHYSICAL EXAM: Blood pressure 106/69, pulse (!) 59, temperature 97.9 F (36.6 C), temperature source Oral, height 5\' 5"  (1.651 m), weight 194 lb (88 kg), SpO2 98 %. Body mass index is 32.28 kg/m. Physical Exam Vitals signs reviewed.  Constitutional:        Appearance: Normal appearance. She is obese.  Cardiovascular:     Rate and Rhythm: Normal rate.  Pulmonary:     Effort: Pulmonary effort is normal.  Musculoskeletal: Normal range of motion.  Skin:    General: Skin is warm and dry.  Neurological:     Mental Status: She is alert and oriented to person, place, and time.  Psychiatric:        Mood and Affect: Mood normal.        Behavior: Behavior normal.    RECENT LABS AND TESTS: BMET    Component Value Date/Time   NA 143 11/25/2017 1208   K 4.5 11/25/2017 1208   CL  106 11/25/2017 1208   CO2 17 (L) 11/25/2017 1208   GLUCOSE 78 11/25/2017 1208   GLUCOSE 96 07/13/2017 1400   BUN 10 11/25/2017 1208   CREATININE 0.73 11/25/2017 1208   CALCIUM 9.3 11/25/2017 1208   GFRNONAA 83 11/25/2017 1208   GFRAA 96 11/25/2017 1208   Lab Results  Component Value Date   HGBA1C 5.5 11/25/2017   Lab Results  Component Value Date   INSULIN 6.6 11/25/2017   CBC    Component Value Date/Time   WBC 16.6 (H) 01/06/2018 1341   RBC 4.77 01/06/2018 1341   HGB 14.4 01/06/2018 1341   HGB 15.1 11/25/2017 1208   HGB 14.5 04/10/2010   HCT 43.5 01/06/2018 1341   HCT 44.8 11/25/2017 1208   PLT 189 01/06/2018 1341   MCV 91.2 01/06/2018 1341   MCV 92 11/25/2017 1208   MCH 30.2 01/06/2018 1341   MCHC 33.1 01/06/2018 1341   RDW 13.8 01/06/2018 1341   RDW 13.5 11/25/2017 1208   LYMPHSABS 10.5 (H) 01/06/2018 1341   LYMPHSABS 8.7 (H) 11/25/2017 1208   MONOABS 0.8 01/06/2018 1341   EOSABS 0.3 01/06/2018 1341   EOSABS 0.3 11/25/2017 1208   BASOSABS 0.1 01/06/2018 1341   BASOSABS 0.1 11/25/2017 1208   Iron/TIBC/Ferritin/ %Sat No results found for: IRON, TIBC, FERRITIN, IRONPCTSAT Lipid Panel     Component Value Date/Time   CHOL 229 (H) 11/25/2017 1208   TRIG 184 (H) 11/25/2017 1208   TRIG 152 04/10/2010   HDL 47 11/25/2017 1208   CHOLHDL 5 07/13/2017 1400   VLDL 46.0 (H) 07/13/2017 1400   LDLCALC 145 (H) 11/25/2017 1208   LDLCALC 67  04/10/2010   LDLDIRECT 141.0 07/13/2017 1400   Hepatic Function Panel     Component Value Date/Time   PROT 6.3 11/25/2017 1208   ALBUMIN 4.2 11/25/2017 1208   AST 20 11/25/2017 1208   AST 18 04/10/2010   ALT 22 11/25/2017 1208   ALKPHOS 74 11/25/2017 1208   ALKPHOS 79 04/10/2010   BILITOT 0.5 11/25/2017 1208      Component Value Date/Time   TSH 2.510 11/25/2017 1208   TSH 3.46 06/19/2016 0808   TSH 1.28 09/24/2015 0831   Results for YARENI, CREPS (MRN [id number]) as of 02/01/2018 07:01  Ref. Range 11/25/2017 12:08  Vitamin D, 25-Hydroxy Latest Ref Range: 30.0 - 100.0 ng/mL 24.4 (L)    OBESITY BEHAVIORAL INTERVENTION VISIT  Today's visit was # 5   Starting weight: 203 lbs Starting date: 11/25/17 Today's weight : Weight: 194 lb (88 kg)  Today's date: 01/31/2018 Total lbs lost to date: 9 At least 15 minutes were spent on discussing the following behavioral intervention visit.  ASK: We discussed the diagnosis of obesity with [place] today and [name] agreed to give [name] permission to discuss obesity behavioral modification therapy today.  ASSESS: [name] has the diagnosis of obesity and her BMI today is 32.2. [name] is in the action stage of change.   ADVISE: [name] was educated on the multiple health risks of obesity as well as the benefit of weight loss to improve her health. She was advised of the need for long term treatment and the importance of lifestyle modifications to improve her current health and to decrease her risk of future health problems.  AGREE: Multiple dietary modification options and treatment options were discussed and [name] agreed to follow the recommendations documented in the above note.  ARRANGE: [name] was educated on the importance of frequent visits to  treat obesity as outlined per CMS and USPSTF guidelines and agreed to schedule her next follow up appointment today.  I, Marcille Blanco, am acting as  Location manager for [employer], FNP-C.  I have reviewed the above documentation for accuracy and completeness, and I agree with the above.  - Dawn Whitmire, FNP-C.

## 2018-02-01 NOTE — Telephone Encounter (Signed)
Left message on vm per dpr relaying Dr. G's message.  

## 2018-02-02 DIAGNOSIS — G5701 Lesion of sciatic nerve, right lower limb: Secondary | ICD-10-CM | POA: Diagnosis not present

## 2018-02-02 DIAGNOSIS — M545 Low back pain: Secondary | ICD-10-CM | POA: Diagnosis not present

## 2018-02-02 DIAGNOSIS — S39012D Strain of muscle, fascia and tendon of lower back, subsequent encounter: Secondary | ICD-10-CM | POA: Diagnosis not present

## 2018-02-05 ENCOUNTER — Other Ambulatory Visit (INDEPENDENT_AMBULATORY_CARE_PROVIDER_SITE_OTHER): Payer: Self-pay | Admitting: Family Medicine

## 2018-02-05 DIAGNOSIS — E559 Vitamin D deficiency, unspecified: Secondary | ICD-10-CM

## 2018-02-07 DIAGNOSIS — S39012D Strain of muscle, fascia and tendon of lower back, subsequent encounter: Secondary | ICD-10-CM | POA: Diagnosis not present

## 2018-02-07 DIAGNOSIS — M545 Low back pain: Secondary | ICD-10-CM | POA: Diagnosis not present

## 2018-02-07 DIAGNOSIS — G5701 Lesion of sciatic nerve, right lower limb: Secondary | ICD-10-CM | POA: Diagnosis not present

## 2018-02-11 DIAGNOSIS — S39012D Strain of muscle, fascia and tendon of lower back, subsequent encounter: Secondary | ICD-10-CM | POA: Diagnosis not present

## 2018-02-11 DIAGNOSIS — G5701 Lesion of sciatic nerve, right lower limb: Secondary | ICD-10-CM | POA: Diagnosis not present

## 2018-02-11 DIAGNOSIS — M545 Low back pain: Secondary | ICD-10-CM | POA: Diagnosis not present

## 2018-02-14 DIAGNOSIS — S39012D Strain of muscle, fascia and tendon of lower back, subsequent encounter: Secondary | ICD-10-CM | POA: Diagnosis not present

## 2018-02-14 DIAGNOSIS — G5701 Lesion of sciatic nerve, right lower limb: Secondary | ICD-10-CM | POA: Diagnosis not present

## 2018-02-14 DIAGNOSIS — M545 Low back pain: Secondary | ICD-10-CM | POA: Diagnosis not present

## 2018-02-15 ENCOUNTER — Ambulatory Visit (INDEPENDENT_AMBULATORY_CARE_PROVIDER_SITE_OTHER): Payer: Medicare HMO | Admitting: Family Medicine

## 2018-02-15 ENCOUNTER — Encounter (INDEPENDENT_AMBULATORY_CARE_PROVIDER_SITE_OTHER): Payer: Self-pay | Admitting: Family Medicine

## 2018-02-15 VITALS — BP 108/71 | HR 52 | Temp 97.9°F | Ht 65.0 in | Wt 194.0 lb

## 2018-02-15 DIAGNOSIS — E559 Vitamin D deficiency, unspecified: Secondary | ICD-10-CM | POA: Diagnosis not present

## 2018-02-15 DIAGNOSIS — E669 Obesity, unspecified: Secondary | ICD-10-CM

## 2018-02-15 DIAGNOSIS — Z6832 Body mass index (BMI) 32.0-32.9, adult: Secondary | ICD-10-CM | POA: Diagnosis not present

## 2018-02-15 DIAGNOSIS — Z6834 Body mass index (BMI) 34.0-34.9, adult: Secondary | ICD-10-CM

## 2018-02-15 NOTE — Progress Notes (Signed)
Office: [phone]  /  Fax: [phone]   HPI:   Chief Complaint: Lori Shaw is here to discuss her progress with her obesity treatment plan. She is on the Category 2 plan and is following her eating plan approximately 90 % of the time. She states she is doing Physical Therapy 45 minutes 2 times per week. Lori Shaw skips dinner at times. She struggles with eating all of the food on the p;pan because she is not hungry. She denies cravings.  Her weight is 194 lb (88 kg) today and has not lost weight since her last visit. She has lost 9 lbs since starting treatment with [name].  Vitamin D deficiency Lori Shaw has a diagnosis of vitamin D deficiency. She is currently taking prescription and OTC vit D and is not at goal. Her last vitamin D level was 24.4 on 11/25/17. She denies nausea, vomiting, or muscle weakness.  ASSESSMENT AND PLAN:  Vitamin D deficiency  Class 1 obesity with serious comorbidity and body mass index (BMI) of 32.0 to 32.9 in adult, unspecified obesity type  PLAN:  Vitamin D Deficiency Lori Shaw was informed that low vitamin D levels contributes to fatigue and are associated with obesity, breast, and colon cancer. She agrees to continue to take OTC and prescription Vit D @50 ,000 IU every week and will follow up for routine testing of vitamin D, at least 2-3 times per year. She was informed of the risk of over-replacement of vitamin D and agrees to not increase her dose unless she discusses this with [name] first. We will order a vitamin D level at her next visit in 2 to 3 weeks. Lori Shaw agrees with this plan.  Obesity Lori Shaw is currently in the action stage of change. As such, her goal is to continue with weight loss efforts. She has agreed to follow the Category 2 plan. She was advised that she must eat all of her food. Lori Shaw will continue current exercise regimen for weight loss and overall health benefits. We discussed the following Behavioral Modification  Strategies today: increasing lean protein intake, no skipping meals, and planning for success.  Lori [place] has agreed to follow up with our clinic in 2 to 3 weeks. She was informed of the importance of frequent follow up visits to maximize her success with intensive lifestyle modifications for her multiple health conditions.  ALLERGIES: Allergies  Allergen Reactions  . Sulfa Antibiotics Nausea Only    MEDICATIONS: Current Outpatient Medications on File Prior to Visit  Medication Sig Dispense Refill  . Cholecalciferol (VITAMIN D) 2000 units CAPS Take 1 capsule by mouth daily.    . Cyanocobalamin (VITAMIN B-12) 2500 MCG SUBL Place 1 tablet (2,500 mcg total) under the tongue daily.  0  . fluticasone (FLONASE) 50 MCG/ACT nasal spray Place 2 sprays into both nostrils daily as needed. 16 g 11  . methocarbamol (ROBAXIN) 500 MG tablet Take 0.5-1 tablets (250-500 mg total) by mouth 2 (two) times daily as needed for muscle spasms. 30 tablet 0  . Vitamin D, Ergocalciferol, (DRISDOL) 1.25 MG (50000 UT) CAPS capsule TAKE ONE CAPSULE BY MOUTH ONCE WEEKLY 4 capsule 0   No current facility-administered medications on file prior to visit.     PAST MEDICAL HISTORY: Past Medical History:  Diagnosis Date  . B12 deficiency   . Back pain   . CLL (chronic lymphocytic leukemia) ([place]) 06/21/2016   Rai stage 0, dx 06/2016 (Dr Janese Banks)  . HLD (hyperlipidemia)   . Joint pain   . Knee problem   .  OSA on CPAP 2006, 04/2014   AHI of 23.8 and an RDI of 42.9, on CPAP 9 cm H2O ([facility])  . Osteopenia 08/2010   T -1.4 at L femur neck  . Piriformis syndrome 12/14/2014  . Vitamin D deficiency     PAST SURGICAL HISTORY: Past Surgical History:  Procedure Laterality Date  . ABDOMINAL HYSTERECTOMY  01/27/1983   for menometrorrhagia, one ovary remained  . COLONOSCOPY  2012   no records available. per patient normal.   . dexa  08/2010   osteopenia  . TONSILLECTOMY  01/26/1973    SOCIAL HISTORY: Social History    Tobacco Use  . Smoking status: Former Smoker    Packs/day: 2.00    Years: 20.00    Pack years: 40.00    Types: Cigarettes    Last attempt to quit: 01/26/1978    Years since quitting: 40.0  . Smokeless tobacco: Never Used  Substance Use Topics  . Alcohol use: No  . Drug use: Yes    Comment: occassionally glass of wine    FAMILY HISTORY: Family History  Problem Relation Age of Onset  . Hyperlipidemia Mother   . CAD Mother        CABG  . Hypertension Mother   . Hyperlipidemia Father   . CAD Father        MI  . Sudden death Father   . Arthritis Maternal Grandmother   . Cancer Sister        breast  . Breast cancer Sister 24  . Alzheimer's disease Unknown   . Heart attack Unknown   . Diabetes Neg Hx   . Stroke Neg Hx    ROS: Review of Systems  Constitutional: Negative for weight loss.  Gastrointestinal: Negative for nausea and vomiting.  Musculoskeletal:       Negative for muscle weakness.   PHYSICAL EXAM: Blood pressure 108/71, pulse (!) 52, temperature 97.9 F (36.6 C), temperature source Oral, height 5\' 5"  (1.651 m), weight 194 lb (88 kg), SpO2 100 %. Body mass index is 32.28 kg/m. Physical Exam Vitals signs reviewed.  Constitutional:      Appearance: Normal appearance. She is obese.  Cardiovascular:     Rate and Rhythm: Normal rate.  Pulmonary:     Effort: Pulmonary effort is normal.  Musculoskeletal: Normal range of motion.  Skin:    General: Skin is warm and dry.  Neurological:     Mental Status: She is alert and oriented to person, place, and time.  Psychiatric:        Mood and Affect: Mood normal.        Behavior: Behavior normal.    RECENT LABS AND TESTS: BMET    Component Value Date/Time   NA 143 11/25/2017 1208   K 4.5 11/25/2017 1208   CL 106 11/25/2017 1208   CO2 17 (L) 11/25/2017 1208   GLUCOSE 78 11/25/2017 1208   GLUCOSE 96 07/13/2017 1400   BUN 10 11/25/2017 1208   CREATININE 0.73 11/25/2017 1208   CALCIUM 9.3 11/25/2017 1208    GFRNONAA 83 11/25/2017 1208   GFRAA 96 11/25/2017 1208   Lab Results  Component Value Date   HGBA1C 5.5 11/25/2017   Lab Results  Component Value Date   INSULIN 6.6 11/25/2017   CBC    Component Value Date/Time   WBC 16.6 (H) 01/06/2018 1341   RBC 4.77 01/06/2018 1341   HGB 14.4 01/06/2018 1341   HGB 15.1 11/25/2017 1208   HGB 14.5 04/10/2010  HCT 43.5 01/06/2018 1341   HCT 44.8 11/25/2017 1208   PLT 189 01/06/2018 1341   MCV 91.2 01/06/2018 1341   MCV 92 11/25/2017 1208   MCH 30.2 01/06/2018 1341   MCHC 33.1 01/06/2018 1341   RDW 13.8 01/06/2018 1341   RDW 13.5 11/25/2017 1208   LYMPHSABS 10.5 (H) 01/06/2018 1341   LYMPHSABS 8.7 (H) 11/25/2017 1208   MONOABS 0.8 01/06/2018 1341   EOSABS 0.3 01/06/2018 1341   EOSABS 0.3 11/25/2017 1208   BASOSABS 0.1 01/06/2018 1341   BASOSABS 0.1 11/25/2017 1208   Iron/TIBC/Ferritin/ %Sat No results found for: IRON, TIBC, FERRITIN, IRONPCTSAT Lipid Panel     Component Value Date/Time   CHOL 229 (H) 11/25/2017 1208   TRIG 184 (H) 11/25/2017 1208   TRIG 152 04/10/2010   HDL 47 11/25/2017 1208   CHOLHDL 5 07/13/2017 1400   VLDL 46.0 (H) 07/13/2017 1400   LDLCALC 145 (H) 11/25/2017 1208   LDLCALC 67 04/10/2010   LDLDIRECT 141.0 07/13/2017 1400   Hepatic Function Panel     Component Value Date/Time   PROT 6.3 11/25/2017 1208   ALBUMIN 4.2 11/25/2017 1208   AST 20 11/25/2017 1208   AST 18 04/10/2010   ALT 22 11/25/2017 1208   ALKPHOS 74 11/25/2017 1208   ALKPHOS 79 04/10/2010   BILITOT 0.5 11/25/2017 1208      Component Value Date/Time   TSH 2.510 11/25/2017 1208   TSH 3.46 06/19/2016 0808   TSH 1.28 09/24/2015 0831   Results for LETITA, PRENTISS (MRN [id number]) as of 02/15/2018 12:58  Ref. Range 11/25/2017 12:08  Vitamin D, 25-Hydroxy Latest Ref Range: 30.0 - 100.0 ng/mL 24.4 (L)    OBESITY BEHAVIORAL INTERVENTION VISIT  Today's visit was # 6   Starting weight: 203 lbs Starting date:  11/25/17 Today's weight : Weight: 194 lb (88 kg)  Today's date: 02/15/2018 Total lbs lost to date: 9 At least 15 minutes were spent on discussing the following behavioral intervention visit.  ASK: We discussed the diagnosis of obesity with [facility] today and Lori Shaw agreed to give [name] permission to discuss obesity behavioral modification therapy today.  ASSESS: Lori Shaw has the diagnosis of obesity and her BMI today is 32.2. Lori Shaw is in the action stage of change.   ADVISE: Lori Shaw was educated on the multiple health risks of obesity as well as the benefit of weight loss to improve her health. She was advised of the need for long term treatment and the importance of lifestyle modifications to improve her current health and to decrease her risk of future health problems.  AGREE: Multiple dietary modification options and treatment options were discussed and Lori Shaw agreed to follow the recommendations documented in the above note.  ARRANGE: Lori Shaw was educated on the importance of frequent visits to treat obesity as outlined per CMS and USPSTF guidelines and agreed to schedule her next follow up appointment today.  I, Marcille Blanco, am acting as Location manager for [employer], FNP-C.  I have reviewed the above documentation for accuracy and completeness, and I agree with the above.  - Deirdra Heumann, FNP-C.

## 2018-02-18 DIAGNOSIS — S39012D Strain of muscle, fascia and tendon of lower back, subsequent encounter: Secondary | ICD-10-CM | POA: Diagnosis not present

## 2018-02-18 DIAGNOSIS — M545 Low back pain: Secondary | ICD-10-CM | POA: Diagnosis not present

## 2018-02-18 DIAGNOSIS — G5701 Lesion of sciatic nerve, right lower limb: Secondary | ICD-10-CM | POA: Diagnosis not present

## 2018-02-21 DIAGNOSIS — S39012D Strain of muscle, fascia and tendon of lower back, subsequent encounter: Secondary | ICD-10-CM | POA: Diagnosis not present

## 2018-02-21 DIAGNOSIS — G5701 Lesion of sciatic nerve, right lower limb: Secondary | ICD-10-CM | POA: Diagnosis not present

## 2018-02-21 DIAGNOSIS — M545 Low back pain: Secondary | ICD-10-CM | POA: Diagnosis not present

## 2018-02-25 DIAGNOSIS — M545 Low back pain: Secondary | ICD-10-CM | POA: Diagnosis not present

## 2018-02-25 DIAGNOSIS — S39012D Strain of muscle, fascia and tendon of lower back, subsequent encounter: Secondary | ICD-10-CM | POA: Diagnosis not present

## 2018-02-25 DIAGNOSIS — G5701 Lesion of sciatic nerve, right lower limb: Secondary | ICD-10-CM | POA: Diagnosis not present

## 2018-02-26 ENCOUNTER — Other Ambulatory Visit (INDEPENDENT_AMBULATORY_CARE_PROVIDER_SITE_OTHER): Payer: Self-pay | Admitting: Family Medicine

## 2018-02-26 DIAGNOSIS — E559 Vitamin D deficiency, unspecified: Secondary | ICD-10-CM

## 2018-03-02 DIAGNOSIS — M545 Low back pain: Secondary | ICD-10-CM | POA: Diagnosis not present

## 2018-03-02 DIAGNOSIS — G5701 Lesion of sciatic nerve, right lower limb: Secondary | ICD-10-CM | POA: Diagnosis not present

## 2018-03-02 DIAGNOSIS — S39012D Strain of muscle, fascia and tendon of lower back, subsequent encounter: Secondary | ICD-10-CM | POA: Diagnosis not present

## 2018-03-10 ENCOUNTER — Other Ambulatory Visit (INDEPENDENT_AMBULATORY_CARE_PROVIDER_SITE_OTHER): Payer: Self-pay | Admitting: Family Medicine

## 2018-03-10 DIAGNOSIS — E559 Vitamin D deficiency, unspecified: Secondary | ICD-10-CM

## 2018-03-17 ENCOUNTER — Ambulatory Visit (INDEPENDENT_AMBULATORY_CARE_PROVIDER_SITE_OTHER): Payer: Medicare HMO | Admitting: Family Medicine

## 2018-03-17 ENCOUNTER — Encounter (INDEPENDENT_AMBULATORY_CARE_PROVIDER_SITE_OTHER): Payer: Self-pay | Admitting: Family Medicine

## 2018-03-17 VITALS — BP 113/71 | HR 58 | Temp 94.1°F | Ht 65.0 in | Wt 192.0 lb

## 2018-03-17 DIAGNOSIS — E8881 Metabolic syndrome: Secondary | ICD-10-CM | POA: Diagnosis not present

## 2018-03-17 DIAGNOSIS — Z6831 Body mass index (BMI) 31.0-31.9, adult: Secondary | ICD-10-CM

## 2018-03-17 DIAGNOSIS — E7849 Other hyperlipidemia: Secondary | ICD-10-CM

## 2018-03-17 DIAGNOSIS — Z6832 Body mass index (BMI) 32.0-32.9, adult: Secondary | ICD-10-CM

## 2018-03-17 DIAGNOSIS — E559 Vitamin D deficiency, unspecified: Secondary | ICD-10-CM | POA: Diagnosis not present

## 2018-03-17 DIAGNOSIS — E669 Obesity, unspecified: Secondary | ICD-10-CM

## 2018-03-17 MED ORDER — VITAMIN D (ERGOCALCIFEROL) 1.25 MG (50000 UNIT) PO CAPS: 50000.0000 [IU] | ORAL_CAPSULE | ORAL | 0 refills | Status: DC

## 2018-03-17 NOTE — Progress Notes (Signed)
Office: [phone]  /  Fax: [phone]   HPI:   Chief Complaint: [name] is here to discuss her progress with her obesity treatment plan. She is on the Category 2 plan and is following her eating plan approximately 90 % of the time. She states she walked on vacation 60 minutes 90 minutes 7 times per week. Lori Shaw has been to [place] and got back 4 days ago. She stayed on plan. Her weight is 192 lb (87.1 kg) today and has had a weight loss of 2 pounds over a period of 4 weeks since her last visit. She has lost 11 lbs since starting treatment with [name].  Vitamin D deficiency [name] has a diagnosis of vitamin D deficiency. She is currently taking prescription Vit D and is not at goal. Her last Vit D level was 24.410/31/2019. She denies nausea, vomiting or muscle weakness.  Insulin Resistance Lori Shaw has a diagnosis of insulin resistance based on her elevated fasting insulin level >5. Although Lameka's blood glucose readings are still under good control, insulin resistance puts her at greater risk of metabolic syndrome and diabetes. Lori Shaw has very mild Insulin Resistance. She is not taking metformin currently and continues to work on diet and exercise to decrease risk of diabetes. She denies polyphagia.   Hyperlipidemia Lori Shaw has hyperlipidemia and has been trying to improve her cholesterol levels with intensive lifestyle modification including a low saturated fat diet, exercise and weight loss. She has been on a statin in the past but is not taking one currently. She denies any chest pain or shortness of breath. The 10-year ASCVD risk score Mikey Bussing DC Jr., et al., 2013) is: 8.8%   Values used to calculate the score:     Age: 71 years     Sex: Female     Is Non-Hispanic African American: No     Diabetic: No     Tobacco smoker: No     Systolic Blood Pressure: 092 mmHg     Is BP treated: No     HDL Cholesterol: 45 mg/dL     Total Cholesterol: 215 mg/dL   ASSESSMENT  AND PLAN:  Vitamin D deficiency - Plan: Vitamin D, Ergocalciferol, (DRISDOL) 1.25 MG (50000 UT) CAPS capsule  Insulin resistance - Plan: Comprehensive metabolic panel, Hemoglobin A1c, Insulin, random  Other hyperlipidemia - Plan: Lipid Panel With LDL/HDL Ratio  Class 1 obesity with serious comorbidity and body mass index (BMI) of 31.0 to 31.9 in adult, unspecified obesity type  PLAN:  Vitamin D Deficiency Lori Shaw was informed that low vitamin D levels contributes to fatigue and are associated with obesity, breast, and colon cancer. She agrees to continue to take prescription Vit D 50,000 IU every week #4 with no refills and will follow up for routine testing of vitamin D, at least 2-3 times per year. She was informed of the risk of over-replacement of vitamin D and agrees to not increase her dose unless she discusses this with [name] first. We will check labs today. Lori Shaw agrees to follow up with our clinic in 2 weeks.  Insulin Resistance Lori Shaw will continue to work on weight loss, exercise, and decreasing simple carbohydrates in her diet to help decrease the risk of diabetes.  Lori Shaw agreed to follow up with [name] as directed to monitor her progress. We will check labs today. Lori Shaw agrees to follow up with our clinic in 2 weeks.  Hyperlipidemia Lori Shaw was informed of the American Heart Association Guidelines emphasizing intensive lifestyle modifications as the first  line treatment for hyperlipidemia. We discussed many lifestyle modifications today in depth, and Lori Shaw will continue to work on decreasing saturated fats such as fatty red meat, butter and many fried foods. She will also increase vegetables and lean protein in her diet and continue to work on exercise and weight loss efforts. We will check labs today. Lori Shaw agrees to follow up with our clinic in 2 weeks.  Obesity Lori Shaw is currently in the action stage of change. As such, her goal is to continue with weight loss  efforts She has agreed to follow the Category 2 plan Lori Shaw will contemplate starting to walk for exercise.  We discussed the following Behavioral Modification Strategies today: Planning for success  Lori Shaw has agreed to follow up with our clinic in 2 weeks. She was informed of the importance of frequent follow up visits to maximize her success with intensive lifestyle modifications for her multiple health conditions.  ALLERGIES: Allergies  Allergen Reactions  . Sulfa Antibiotics Nausea Only    MEDICATIONS: Current Outpatient Medications on File Prior to Visit  Medication Sig Dispense Refill  . Cholecalciferol (VITAMIN D) 2000 units CAPS Take 1 capsule by mouth daily.    . Cyanocobalamin (VITAMIN B-12) 2500 MCG SUBL Place 1 tablet (2,500 mcg total) under the tongue daily.  0  . fluticasone (FLONASE) 50 MCG/ACT nasal spray Place 2 sprays into both nostrils daily as needed. 16 g 11  . methocarbamol (ROBAXIN) 500 MG tablet Take 0.5-1 tablets (250-500 mg total) by mouth 2 (two) times daily as needed for muscle spasms. 30 tablet 0   No current facility-administered medications on file prior to visit.     PAST MEDICAL HISTORY: Past Medical History:  Diagnosis Date  . B12 deficiency   . Back pain   . CLL (chronic lymphocytic leukemia) ([place]) 06/21/2016   Rai stage 0, dx 06/2016 (Dr Janese Banks)  . HLD (hyperlipidemia)   . Joint pain   . Knee problem   . OSA on CPAP 2006, 04/2014   AHI of 23.8 and an RDI of 42.9, on CPAP 9 cm H2O (Dohmeier)  . Osteopenia 08/2010   T -1.4 at L femur neck  . Piriformis syndrome 12/14/2014  . Vitamin D deficiency     PAST SURGICAL HISTORY: Past Surgical History:  Procedure Laterality Date  . ABDOMINAL HYSTERECTOMY  01/27/1983   for menometrorrhagia, one ovary remained  . COLONOSCOPY  2012   no records available. per patient normal.   . dexa  08/2010   osteopenia  . TONSILLECTOMY  01/26/1973    SOCIAL HISTORY: Social History   Tobacco Use  .  Smoking status: Former Smoker    Packs/day: 2.00    Years: 20.00    Pack years: 40.00    Types: Cigarettes    Last attempt to quit: 01/26/1978    Years since quitting: 40.1  . Smokeless tobacco: Never Used  Substance Use Topics  . Alcohol use: No  . Drug use: Yes    Comment: occassionally glass of wine    FAMILY HISTORY: Family History  Problem Relation Age of Onset  . Hyperlipidemia Mother   . CAD Mother        CABG  . Hypertension Mother   . Hyperlipidemia Father   . CAD Father        MI  . Sudden death Father   . Arthritis Maternal Grandmother   . Cancer Sister        breast  . Breast cancer Sister 71  .  Alzheimer's disease Unknown   . Heart attack Unknown   . Diabetes Neg Hx   . Stroke Neg Hx     ROS: Review of Systems  Constitutional: Positive for weight loss.  Respiratory: Negative for shortness of breath.   Cardiovascular: Negative for chest pain.  Gastrointestinal: Negative for nausea and vomiting.  Genitourinary:       Negative for polyuria  Musculoskeletal:       Negative for muscle weakness  Endo/Heme/Allergies:       Negative for polyphagia    PHYSICAL EXAM: Blood pressure 113/71, pulse (!) 58, temperature (!) 94.1 F (34.5 C), height 5\' 5"  (1.651 m), weight 192 lb (87.1 kg), SpO2 99 %. Body mass index is 31.95 kg/m. Physical Exam Vitals signs reviewed.  Constitutional:      Appearance: Normal appearance. She is obese.  Cardiovascular:     Rate and Rhythm: Normal rate.     Pulses: Normal pulses.  Pulmonary:     Effort: Pulmonary effort is normal.  Musculoskeletal: Normal range of motion.  Skin:    General: Skin is warm and dry.  Neurological:     Mental Status: She is alert and oriented to person, place, and time.  Psychiatric:        Mood and Affect: Mood normal.        Behavior: Behavior normal.     RECENT LABS AND TESTS: BMET    Component Value Date/Time   NA 142 03/17/2018 1041   K 4.1 03/17/2018 1041   CL 102 03/17/2018  1041   CO2 24 03/17/2018 1041   GLUCOSE 84 03/17/2018 1041   GLUCOSE 96 07/13/2017 1400   BUN 16 03/17/2018 1041   CREATININE 0.78 03/17/2018 1041   CALCIUM 9.3 03/17/2018 1041   GFRNONAA 77 03/17/2018 1041   GFRAA 88 03/17/2018 1041   Lab Results  Component Value Date   HGBA1C 5.4 03/17/2018   HGBA1C 5.5 11/25/2017   Lab Results  Component Value Date   INSULIN 20.9 03/17/2018   INSULIN 6.6 11/25/2017   CBC    Component Value Date/Time   WBC 16.6 (H) 01/06/2018 1341   RBC 4.77 01/06/2018 1341   HGB 14.4 01/06/2018 1341   HGB 15.1 11/25/2017 1208   HGB 14.5 04/10/2010   HCT 43.5 01/06/2018 1341   HCT 44.8 11/25/2017 1208   PLT 189 01/06/2018 1341   MCV 91.2 01/06/2018 1341   MCV 92 11/25/2017 1208   MCH 30.2 01/06/2018 1341   MCHC 33.1 01/06/2018 1341   RDW 13.8 01/06/2018 1341   RDW 13.5 11/25/2017 1208   LYMPHSABS 10.5 (H) 01/06/2018 1341   LYMPHSABS 8.7 (H) 11/25/2017 1208   MONOABS 0.8 01/06/2018 1341   EOSABS 0.3 01/06/2018 1341   EOSABS 0.3 11/25/2017 1208   BASOSABS 0.1 01/06/2018 1341   BASOSABS 0.1 11/25/2017 1208   Iron/TIBC/Ferritin/ %Sat No results found for: IRON, TIBC, FERRITIN, IRONPCTSAT Lipid Panel     Component Value Date/Time   CHOL 215 (H) 03/17/2018 1041   TRIG 191 (H) 03/17/2018 1041   TRIG 152 04/10/2010   HDL 45 03/17/2018 1041   CHOLHDL 5 07/13/2017 1400   VLDL 46.0 (H) 07/13/2017 1400   LDLCALC 132 (H) 03/17/2018 1041   LDLCALC 67 04/10/2010   LDLDIRECT 141.0 07/13/2017 1400   Hepatic Function Panel     Component Value Date/Time   PROT 6.5 03/17/2018 1041   ALBUMIN 4.6 03/17/2018 1041   AST 19 03/17/2018 1041   AST 18 04/10/2010  ALT 18 03/17/2018 1041   ALKPHOS 70 03/17/2018 1041   ALKPHOS 79 04/10/2010   BILITOT 0.5 03/17/2018 1041      Component Value Date/Time   TSH 2.510 11/25/2017 1208   TSH 3.46 06/19/2016 0808   TSH 1.28 09/24/2015 0831    Ref. Range 11/25/2017 12:08  Vitamin D, 25-Hydroxy Latest Ref  Range: 30.0 - 100.0 ng/mL 24.4 (L)     OBESITY BEHAVIORAL INTERVENTION VISIT  Today's visit was # 7   Starting weight: 203 lbs Starting date: 11/25/2017 Today's weight :: 192 lbs Today's date: 03/21/2018 Total lbs lost to date: 11    03/17/2018  BP 113/71  Temp 94.1 F (34.5 C) (A)  Pulse 58 (A)  SpO2 99 %  Weight 192 lb  Height 5\' 5"  (1.651 m)  BMI (Calculated) 31.95    At least 15 minutes were spent on discussing the following behavioral intervention visit.   ASK: We discussed the diagnosis of obesity with [place] today and Lori Shaw agreed to give [name] permission to discuss obesity behavioral modification therapy today.  ASSESS: Lori Shaw has the diagnosis of obesity and her BMI today is 31.95 Lori Shaw is in the action stage of change   ADVISE: Lori Shaw was educated on the multiple health risks of obesity as well as the benefit of weight loss to improve her health. She was advised of the need for long term treatment and the importance of lifestyle modifications to improve her current health and to decrease her risk of future health problems.  AGREE: Multiple dietary modification options and treatment options were discussed and  Lori Shaw agreed to follow the recommendations documented in the above note.  ARRANGE: Lori Shaw was educated on the importance of frequent visits to treat obesity as outlined per CMS and USPSTF guidelines and agreed to schedule her next follow up appointment today.  I, Tammy Wysor, am acting as Location manager for [employer], FNP-C.  I have reviewed the above documentation for accuracy and completeness, and I agree with the above.  - Alette Kataoka, FNP-C.

## 2018-03-18 LAB — HEMOGLOBIN A1C
Est. average glucose Bld gHb Est-mCnc: 108 mg/dL
Hgb A1c MFr Bld: 5.4 % (ref 4.8–5.6)

## 2018-03-18 LAB — COMPREHENSIVE METABOLIC PANEL
ALT: 18 IU/L (ref 0–32)
AST: 19 IU/L (ref 0–40)
Albumin/Globulin Ratio: 2.4 — ABNORMAL HIGH (ref 1.2–2.2)
Albumin: 4.6 g/dL (ref 3.7–4.7)
Alkaline Phosphatase: 70 IU/L (ref 39–117)
BUN/Creatinine Ratio: 21 (ref 12–28)
BUN: 16 mg/dL (ref 8–27)
Bilirubin Total: 0.5 mg/dL (ref 0.0–1.2)
CO2: 24 mmol/L (ref 20–29)
Calcium: 9.3 mg/dL (ref 8.7–10.3)
Chloride: 102 mmol/L (ref 96–106)
Creatinine, Ser: 0.78 mg/dL (ref 0.57–1.00)
GFR calc Af Amer: 88 mL/min/{1.73_m2} (ref >59)
GFR calc non Af Amer: 77 mL/min/{1.73_m2} (ref >59)
Globulin, Total: 1.9 g/dL (ref 1.5–4.5)
Glucose: 84 mg/dL (ref 65–99)
Potassium: 4.1 mmol/L (ref 3.5–5.2)
Sodium: 142 mmol/L (ref 134–144)
Total Protein: 6.5 g/dL (ref 6.0–8.5)

## 2018-03-18 LAB — LIPID PANEL WITH LDL/HDL RATIO
Cholesterol, Total: 215 mg/dL — ABNORMAL HIGH (ref 100–199)
HDL: 45 mg/dL (ref >39)
LDL Calculated: 132 mg/dL — ABNORMAL HIGH (ref 0–99)
LDl/HDL Ratio: 2.9 ratio (ref 0.0–3.2)
Triglycerides: 191 mg/dL — ABNORMAL HIGH (ref 0–149)
VLDL Cholesterol Cal: 38 mg/dL (ref 5–40)

## 2018-03-18 LAB — INSULIN, RANDOM: INSULIN: 20.9 u[IU]/mL (ref 2.6–24.9)

## 2018-03-21 ENCOUNTER — Encounter (INDEPENDENT_AMBULATORY_CARE_PROVIDER_SITE_OTHER): Payer: Self-pay | Admitting: Family Medicine

## 2018-03-21 DIAGNOSIS — E88819 Insulin resistance, unspecified: Secondary | ICD-10-CM | POA: Insufficient documentation

## 2018-03-21 DIAGNOSIS — E8881 Metabolic syndrome: Secondary | ICD-10-CM | POA: Insufficient documentation

## 2018-03-31 ENCOUNTER — Ambulatory Visit (INDEPENDENT_AMBULATORY_CARE_PROVIDER_SITE_OTHER): Payer: Medicare HMO | Admitting: Family Medicine

## 2018-03-31 VITALS — BP 109/70 | HR 66 | Temp 97.4°F | Ht 65.0 in | Wt 188.0 lb

## 2018-03-31 DIAGNOSIS — E8881 Metabolic syndrome: Secondary | ICD-10-CM

## 2018-03-31 DIAGNOSIS — E559 Vitamin D deficiency, unspecified: Secondary | ICD-10-CM

## 2018-03-31 DIAGNOSIS — E669 Obesity, unspecified: Secondary | ICD-10-CM

## 2018-03-31 DIAGNOSIS — Z6831 Body mass index (BMI) 31.0-31.9, adult: Secondary | ICD-10-CM

## 2018-03-31 DIAGNOSIS — Z6836 Body mass index (BMI) 36.0-36.9, adult: Secondary | ICD-10-CM

## 2018-03-31 MED ORDER — VITAMIN D (ERGOCALCIFEROL) 1.25 MG (50000 UNIT) PO CAPS: 50000.0000 [IU] | ORAL_CAPSULE | ORAL | 0 refills | Status: DC

## 2018-04-04 ENCOUNTER — Encounter (INDEPENDENT_AMBULATORY_CARE_PROVIDER_SITE_OTHER): Payer: Self-pay | Admitting: Family Medicine

## 2018-04-04 NOTE — Progress Notes (Signed)
Office: [phone]  /  Fax: [phone]   HPI:   Chief Complaint: Lori Shaw is here to discuss her progress with her obesity treatment plan. She is on the Category 2 plan and is following her eating plan approximately 75-80% of the time. She states she is walking 15-20 minutes 2 times per week. Lori Shaw states she has not been eating all of the food on the plan at times. Her weight is 188 lb (85.3 kg) today and has had a weight loss of 4 pounds over a period of 2 weeks since her last visit. She has lost 15 lbs since starting treatment with [name].  Vitamin D deficiency Lori Shaw has a diagnosis of Vitamin D deficiency which is not at goal. Her last Vitamin D level was reported to be low at 24.4 on 11/25/2017. She is currently taking prescription Vit D and denies nausea, vomiting or muscle weakness.  Insulin Resistance Lori Shaw has a diagnosis of insulin resistance based on her elevated fasting insulin level >5. Although Lori Shaw's blood glucose readings are still under good control, insulin resistance puts her at greater risk of metabolic syndrome and diabetes. She is not taking metformin currently and continues to work on diet and exercise to decrease risk of diabetes. She denies polyphagia. Lab Results  Component Value Date   HGBA1C 5.4 03/17/2018    ASSESSMENT AND PLAN:  Vitamin D deficiency - Plan: Vitamin D, Ergocalciferol, (DRISDOL) 1.25 MG (50000 UT) CAPS capsule  Insulin resistance  Class 1 obesity with serious comorbidity and body mass index (BMI) of 31.0 to 31.9 in adult, unspecified obesity type  PLAN:  Vitamin D Deficiency Lori Shaw was informed that low Vitamin D levels contributes to fatigue and are associated with obesity, breast, and colon cancer. She agrees to continue to take prescription Vit D @ 50,000 IU every week #4 with 0 refills and will follow-up for routine testing of Vitamin D, at least 2-3 times per year. She was informed of the risk of  over-replacement of Vitamin D and agrees to not increase her dose unless she discusses this with [name] first. Lori Shaw agrees to follow-up with our clinic in 3 weeks.  Insulin Resistance Lori Shaw will continue to work on weight loss, exercise, and decreasing simple carbohydrates in her diet to help decrease the risk of diabetes. We dicussed metformin including benefits and risks. She was informed that eating too many simple carbohydrates or too many calories at one sitting increases the likelihood of GI side effects. Lori Shaw is not on metformin for now and prescription was not written today. Lori Shaw agreed to follow-up with [name] as directed to monitor her progress.  Obesity Lori Shaw is currently in the action stage of change. As such, her goal is to continue with weight loss efforts. She has agreed to follow the Category 2 plan. She was advised to eat all of the food on the plan. Lori Shaw has been instructed to continue her exercise regimen as above. We discussed the following Behavioral Modification Strategies today: planning for success.  Lori Shaw has agreed to follow-up with our clinic in 3 weeks. She was informed of the importance of frequent follow up visits to maximize her success with intensive lifestyle modifications for her multiple health conditions.  ALLERGIES: Allergies  Allergen Reactions  . Sulfa Antibiotics Nausea Only    MEDICATIONS: Current Outpatient Medications on File Prior to Visit  Medication Sig Dispense Refill  . Cholecalciferol (VITAMIN D) 2000 units CAPS Take 1 capsule by mouth daily.    . Cyanocobalamin (VITAMIN  B-12) 2500 MCG SUBL Place 1 tablet (2,500 mcg total) under the tongue daily.  0  . fluticasone (FLONASE) 50 MCG/ACT nasal spray Place 2 sprays into both nostrils daily as needed. 16 g 11  . methocarbamol (ROBAXIN) 500 MG tablet Take 0.5-1 tablets (250-500 mg total) by mouth 2 (two) times daily as needed for muscle spasms. 30 tablet 0   No current  facility-administered medications on file prior to visit.     PAST MEDICAL HISTORY: Past Medical History:  Diagnosis Date  . B12 deficiency   . Back pain   . CLL (chronic lymphocytic leukemia) ([place]) 06/21/2016   Rai stage 0, dx 06/2016 (Dr Janese Banks)  . HLD (hyperlipidemia)   . Joint pain   . Knee problem   . OSA on CPAP 2006, 04/2014   AHI of 23.8 and an RDI of 42.9, on CPAP 9 cm H2O (Dohmeier)  . Osteopenia 08/2010   T -1.4 at L femur neck  . Piriformis syndrome 12/14/2014  . Vitamin D deficiency     PAST SURGICAL HISTORY: Past Surgical History:  Procedure Laterality Date  . ABDOMINAL HYSTERECTOMY  01/27/1983   for menometrorrhagia, one ovary remained  . COLONOSCOPY  2012   no records available. per patient normal.   . dexa  08/2010   osteopenia  . TONSILLECTOMY  01/26/1973    SOCIAL HISTORY: Social History   Tobacco Use  . Smoking status: Former Smoker    Packs/day: 2.00    Years: 20.00    Pack years: 40.00    Types: Cigarettes    Last attempt to quit: 01/26/1978    Years since quitting: 40.2  . Smokeless tobacco: Never Used  Substance Use Topics  . Alcohol use: No  . Drug use: Yes    Comment: occassionally glass of wine    FAMILY HISTORY: Family History  Problem Relation Age of Onset  . Hyperlipidemia Mother   . CAD Mother        CABG  . Hypertension Mother   . Hyperlipidemia Father   . CAD Father        MI  . Sudden death Father   . Arthritis Maternal Grandmother   . Cancer Sister        breast  . Breast cancer Sister 64  . Alzheimer's disease Unknown   . Heart attack Unknown   . Diabetes Neg Hx   . Stroke Neg Hx    ROS: Review of Systems  Constitutional: Positive for weight loss.  Gastrointestinal: Negative for nausea and vomiting.  Musculoskeletal:       Negative for muscle weakness.  Endo/Heme/Allergies:       Negative for polyphagia. Negative for hypoglycemia.   PHYSICAL EXAM: Blood pressure 109/70, pulse 66, temperature (!) 97.4 F (36.3  C), temperature source Oral, height 5\' 5"  (1.651 m), weight 188 lb (85.3 kg), SpO2 96 %. Body mass index is 31.28 kg/m. Physical Exam Vitals signs reviewed.  Constitutional:      Appearance: Normal appearance. She is obese.  Cardiovascular:     Rate and Rhythm: Normal rate.     Pulses: Normal pulses.  Pulmonary:     Effort: Pulmonary effort is normal.     Breath sounds: Normal breath sounds.  Musculoskeletal: Normal range of motion.  Skin:    General: Skin is warm and dry.  Neurological:     Mental Status: She is alert and oriented to person, place, and time.  Psychiatric:        Behavior:  Behavior normal.   RECENT LABS AND TESTS: BMET    Component Value Date/Time   NA 142 03/17/2018 1041   K 4.1 03/17/2018 1041   CL 102 03/17/2018 1041   CO2 24 03/17/2018 1041   GLUCOSE 84 03/17/2018 1041   GLUCOSE 96 07/13/2017 1400   BUN 16 03/17/2018 1041   CREATININE 0.78 03/17/2018 1041   CALCIUM 9.3 03/17/2018 1041   GFRNONAA 77 03/17/2018 1041   GFRAA 88 03/17/2018 1041   Lab Results  Component Value Date   HGBA1C 5.4 03/17/2018   HGBA1C 5.5 11/25/2017   Lab Results  Component Value Date   INSULIN 20.9 03/17/2018   INSULIN 6.6 11/25/2017   CBC    Component Value Date/Time   WBC 16.6 (H) 01/06/2018 1341   RBC 4.77 01/06/2018 1341   HGB 14.4 01/06/2018 1341   HGB 15.1 11/25/2017 1208   HGB 14.5 04/10/2010   HCT 43.5 01/06/2018 1341   HCT 44.8 11/25/2017 1208   PLT 189 01/06/2018 1341   MCV 91.2 01/06/2018 1341   MCV 92 11/25/2017 1208   MCH 30.2 01/06/2018 1341   MCHC 33.1 01/06/2018 1341   RDW 13.8 01/06/2018 1341   RDW 13.5 11/25/2017 1208   LYMPHSABS 10.5 (H) 01/06/2018 1341   LYMPHSABS 8.7 (H) 11/25/2017 1208   MONOABS 0.8 01/06/2018 1341   EOSABS 0.3 01/06/2018 1341   EOSABS 0.3 11/25/2017 1208   BASOSABS 0.1 01/06/2018 1341   BASOSABS 0.1 11/25/2017 1208   Iron/TIBC/Ferritin/ %Sat No results found for: IRON, TIBC, FERRITIN, IRONPCTSAT Lipid Panel       Component Value Date/Time   CHOL 215 (H) 03/17/2018 1041   TRIG 191 (H) 03/17/2018 1041   TRIG 152 04/10/2010   HDL 45 03/17/2018 1041   CHOLHDL 5 07/13/2017 1400   VLDL 46.0 (H) 07/13/2017 1400   LDLCALC 132 (H) 03/17/2018 1041   LDLCALC 67 04/10/2010   LDLDIRECT 141.0 07/13/2017 1400   Hepatic Function Panel     Component Value Date/Time   PROT 6.5 03/17/2018 1041   ALBUMIN 4.6 03/17/2018 1041   AST 19 03/17/2018 1041   AST 18 04/10/2010   ALT 18 03/17/2018 1041   ALKPHOS 70 03/17/2018 1041   ALKPHOS 79 04/10/2010   BILITOT 0.5 03/17/2018 1041      Component Value Date/Time   TSH 2.510 11/25/2017 1208   TSH 3.46 06/19/2016 0808   TSH 1.28 09/24/2015 0831    Ref. Range 11/25/2017 12:08  Vitamin D, 25-Hydroxy Latest Ref Range: 30.0 - 100.0 ng/mL 24.4 (L)   OBESITY BEHAVIORAL INTERVENTION VISIT  Today's visit was #8  Starting weight: 203 lbs Starting date: 11/25/2017 Today's weight: 188 lbs  Today's date: 03/31/2018 Total lbs lost to date: 15 At least 15 minutes were spent on discussing the following behavioral intervention visit.    03/31/2018  Height 5\' 5"  (1.651 m)  Weight 188 lb (85.3 kg)  BMI (Calculated) 31.28  BLOOD PRESSURE - SYSTOLIC 518  BLOOD PRESSURE - DIASTOLIC 70   Body Fat % 84.1 %  Total Body Water (lbs) 66.8 lbs   ASK: We discussed the diagnosis of obesity with [place] today and Lori Shaw agreed to give [name] permission to discuss obesity behavioral modification therapy today.  ASSESS: Lori Shaw has the diagnosis of obesity and her BMI today is 31.28. Lori Shaw is in the action stage of change.   ADVISE: Lori Shaw was educated on the multiple health risks of obesity as well as the benefit of weight loss  to improve her health. She was advised of the need for long term treatment and the importance of lifestyle modifications to improve her current health and to decrease her risk of future health problems.  AGREE: Multiple dietary  modification options and treatment options were discussed and  Lori Shaw agreed to follow the recommendations documented in the above note.  ARRANGE: Lori Shaw was educated on the importance of frequent visits to treat obesity as outlined per CMS and USPSTF guidelines and agreed to schedule her next follow up appointment today.  IMichaelene Song, am acting as Location manager for [employer], FNP-C.  I have reviewed the above documentation for accuracy and completeness, and I agree with the above.  - Nakyia Dau, FNP-C.

## 2018-04-08 DIAGNOSIS — G4733 Obstructive sleep apnea (adult) (pediatric): Secondary | ICD-10-CM | POA: Diagnosis not present

## 2018-04-14 ENCOUNTER — Other Ambulatory Visit (INDEPENDENT_AMBULATORY_CARE_PROVIDER_SITE_OTHER): Payer: Self-pay | Admitting: Family Medicine

## 2018-04-14 DIAGNOSIS — E559 Vitamin D deficiency, unspecified: Secondary | ICD-10-CM

## 2018-04-15 ENCOUNTER — Encounter: Payer: Self-pay | Admitting: Family Medicine

## 2018-04-16 MED ORDER — GUAIFENESIN-CODEINE 100-10 MG/5ML PO SYRP: 5.0000 mL | ORAL_SOLUTION | Freq: Two times a day (BID) | ORAL | 0 refills | Status: DC | PRN

## 2018-04-16 NOTE — Telephone Encounter (Signed)
plz call Monday for f/u - would offer phone evaluation if not improving.

## 2018-04-18 NOTE — Telephone Encounter (Signed)
Spoke with pt asking how she was doing. States she is doing a lot better and the cough is improving. Thinks it is her allergies. Fyi to Dr. Darnell Level.

## 2018-04-21 ENCOUNTER — Ambulatory Visit (INDEPENDENT_AMBULATORY_CARE_PROVIDER_SITE_OTHER): Payer: Medicare HMO | Admitting: Family Medicine

## 2018-04-21 ENCOUNTER — Encounter (INDEPENDENT_AMBULATORY_CARE_PROVIDER_SITE_OTHER): Payer: Self-pay

## 2018-04-25 ENCOUNTER — Other Ambulatory Visit (INDEPENDENT_AMBULATORY_CARE_PROVIDER_SITE_OTHER): Payer: Self-pay | Admitting: Family Medicine

## 2018-04-25 DIAGNOSIS — E559 Vitamin D deficiency, unspecified: Secondary | ICD-10-CM

## 2018-04-29 ENCOUNTER — Telehealth: Payer: Self-pay | Admitting: Family Medicine

## 2018-04-29 NOTE — Telephone Encounter (Signed)
Dr. G, do you have this info? 

## 2018-04-29 NOTE — Telephone Encounter (Signed)
If they haven't received it yet, would re fax. I don't have these with me.

## 2018-04-29 NOTE — Telephone Encounter (Signed)
Want to know the status of PT notes that need signatures. Please advise

## 2018-05-05 NOTE — Telephone Encounter (Signed)
Spoke with Dianna of [facility] her we have not received PT notes for pt.  States she will refax today.

## 2018-05-11 ENCOUNTER — Encounter (INDEPENDENT_AMBULATORY_CARE_PROVIDER_SITE_OTHER): Payer: Self-pay | Admitting: Family Medicine

## 2018-05-18 ENCOUNTER — Other Ambulatory Visit: Payer: Self-pay

## 2018-05-18 ENCOUNTER — Encounter (INDEPENDENT_AMBULATORY_CARE_PROVIDER_SITE_OTHER): Payer: Self-pay | Admitting: Family Medicine

## 2018-05-18 ENCOUNTER — Ambulatory Visit (INDEPENDENT_AMBULATORY_CARE_PROVIDER_SITE_OTHER): Payer: Medicare HMO | Admitting: Family Medicine

## 2018-05-18 DIAGNOSIS — E559 Vitamin D deficiency, unspecified: Secondary | ICD-10-CM | POA: Diagnosis not present

## 2018-05-18 DIAGNOSIS — E8881 Metabolic syndrome: Secondary | ICD-10-CM

## 2018-05-18 DIAGNOSIS — Z6831 Body mass index (BMI) 31.0-31.9, adult: Secondary | ICD-10-CM | POA: Diagnosis not present

## 2018-05-18 DIAGNOSIS — E669 Obesity, unspecified: Secondary | ICD-10-CM | POA: Diagnosis not present

## 2018-05-18 MED ORDER — VITAMIN D (ERGOCALCIFEROL) 1.25 MG (50000 UNIT) PO CAPS: 50000.0000 [IU] | ORAL_CAPSULE | ORAL | 0 refills | Status: DC

## 2018-05-18 NOTE — Progress Notes (Signed)
Office: [phone]  /  Fax: [phone] TeleHealth Visit:  [place] has verbally consented to this TeleHealth visit today. The patient is located at home, the provider is located at the [facility] office. The participants in this visit include the listed provider and patient. The visit was conducted today via Webex, but the video did not work.  HPI:   Chief Complaint: OBESITY Lori Shaw is here to discuss her progress with her obesity treatment plan. She is on the Category 2 plan and is following her eating plan approximately 55 to 60 % of the time. She states she is walking and doing yard work. Lori Shaw weighs 189 pounds on her scale. She still does not eat all of the food on her plan but she is eating all of the protein on her plan. Lori Shaw admits to drinking more wine recently.  She asked about COVID19 antibody test. She and her husband feel that they may have had COVID after a trip to [place]. I advised her we are not doing antibody testing.  We were unable to weigh the patient today for this TeleHealth visit. She feels as if she has lost weight since her last visit. She has lost 15 lbs since starting treatment with [name].  Vitamin D Deficiency Lori Shaw has a diagnosis of vitamin D deficiency. She is currently on vit D and her last level was 24.4 on 11/25/17. Lori Shaw denies nausea, vomiting, or muscle weakness.  Insulin Resistance Lori Shaw has a diagnosis of insulin resistance based on her elevated fasting insulin level >5. Although [place]'s blood glucose readings are still under good control, insulin resistance puts her at greater risk of metabolic syndrome and diabetes. She is not taking metformin currently and continues to work on diet and exercise to decrease risk of diabetes. Lori Shaw denies polyphagia. Lab Results  Component Value Date   HGBA1C 5.4 03/17/2018    ASSESSMENT AND PLAN:  Vitamin D deficiency - Plan: Vitamin D, Ergocalciferol,  (DRISDOL) 1.25 MG (50000 UT) CAPS capsule  Insulin resistance  Class 1 obesity with serious comorbidity and body mass index (BMI) of 31.0 to 31.9 in adult, unspecified obesity type  PLAN:  Vitamin D Deficiency Lori Shaw was informed that low vitamin D levels contribute to fatigue and are associated with obesity, breast, and colon cancer. Lori Shaw agrees to continue to take prescription Vit D @50 ,000 IU every week #4 with no refills and will follow up for routine testing of vitamin D, at least 2-3 times per year. She was informed of the risk of over-replacement of vitamin D and agrees to not increase her dose unless she discusses this with [name] first. We will check her vitamin D level in 4 to 6 weeks and Lori Shaw agrees to follow up in 2 weeks as directed.  Insulin Resistance Lori Shaw will continue to work on weight loss, exercise, and decreasing simple carbohydrates in her diet to help decrease the risk of diabetes. Lori Shaw agreed to continue her meal plan and to follow up with [name] as directed to monitor her progress.   Obesity Lori Shaw is currently in the action stage of change. As such, her goal is to continue with weight loss efforts. She has agreed to follow the Category 2 plan. Lori Shaw has been instructed to continue her current regimen of walking and yard work. We discussed the following Behavioral Modification Strategies today: planning for success.  Lori Shaw has agreed to follow up with our clinic in 2 weeks. She was informed of the importance of frequent follow  up visits to maximize her success with intensive lifestyle modifications for her multiple health conditions.  ALLERGIES: Allergies  Allergen Reactions  . Sulfa Antibiotics Nausea Only    MEDICATIONS: Current Outpatient Medications on File Prior to Visit  Medication Sig Dispense Refill  . Cholecalciferol (VITAMIN D) 2000 units CAPS Take 1 capsule by mouth daily.    . Cyanocobalamin (VITAMIN B-12) 2500 MCG SUBL Place 1  tablet (2,500 mcg total) under the tongue daily.  0  . fluticasone (FLONASE) 50 MCG/ACT nasal spray Place 2 sprays into both nostrils daily as needed. 16 g 11  . guaiFENesin-codeine (CHERATUSSIN AC) 100-10 MG/5ML syrup Take 5 mLs by mouth 2 (two) times daily as needed for cough (sedation precautions). 120 mL 0  . methocarbamol (ROBAXIN) 500 MG tablet Take 0.5-1 tablets (250-500 mg total) by mouth 2 (two) times daily as needed for muscle spasms. 30 tablet 0   No current facility-administered medications on file prior to visit.     PAST MEDICAL HISTORY: Past Medical History:  Diagnosis Date  . B12 deficiency   . Back pain   . CLL (chronic lymphocytic leukemia) ([place]) 06/21/2016   Rai stage 0, dx 06/2016 (Dr Janese Banks)  . HLD (hyperlipidemia)   . Joint pain   . Knee problem   . OSA on CPAP 2006, 04/2014   AHI of 23.8 and an RDI of 42.9, on CPAP 9 cm H2O (Dohmeier)  . Osteopenia 08/2010   T -1.4 at L femur neck  . Piriformis syndrome 12/14/2014  . Vitamin D deficiency     PAST SURGICAL HISTORY: Past Surgical History:  Procedure Laterality Date  . ABDOMINAL HYSTERECTOMY  01/27/1983   for menometrorrhagia, one ovary remained  . COLONOSCOPY  2012   no records available. per patient normal.   . dexa  08/2010   osteopenia  . TONSILLECTOMY  01/26/1973    SOCIAL HISTORY: Social History   Tobacco Use  . Smoking status: Former Smoker    Packs/day: 2.00    Years: 20.00    Pack years: 40.00    Types: Cigarettes    Last attempt to quit: 01/26/1978    Years since quitting: 40.3  . Smokeless tobacco: Never Used  Substance Use Topics  . Alcohol use: No  . Drug use: Yes    Comment: occassionally glass of wine    FAMILY HISTORY: Family History  Problem Relation Age of Onset  . Hyperlipidemia Mother   . CAD Mother        CABG  . Hypertension Mother   . Hyperlipidemia Father   . CAD Father        MI  . Sudden death Father   . Arthritis Maternal Grandmother   . Cancer Sister         breast  . Breast cancer Sister 23  . Alzheimer's disease Unknown   . Heart attack Unknown   . Diabetes Neg Hx   . Stroke Neg Hx     ROS: Review of Systems  Gastrointestinal: Negative for nausea and vomiting.  Musculoskeletal:       Negative for muscle weakness.  Endo/Heme/Allergies:       Negative for polyphagia.    PHYSICAL EXAM: Pt in no acute distress  RECENT LABS AND TESTS: BMET    Component Value Date/Time   NA 142 03/17/2018 1041   K 4.1 03/17/2018 1041   CL 102 03/17/2018 1041   CO2 24 03/17/2018 1041   GLUCOSE 84 03/17/2018 1041   GLUCOSE 96  07/13/2017 1400   BUN 16 03/17/2018 1041   CREATININE 0.78 03/17/2018 1041   CALCIUM 9.3 03/17/2018 1041   GFRNONAA 77 03/17/2018 1041   GFRAA 88 03/17/2018 1041   Lab Results  Component Value Date   HGBA1C 5.4 03/17/2018   HGBA1C 5.5 11/25/2017   Lab Results  Component Value Date   INSULIN 20.9 03/17/2018   INSULIN 6.6 11/25/2017   CBC    Component Value Date/Time   WBC 16.6 (H) 01/06/2018 1341   RBC 4.77 01/06/2018 1341   HGB 14.4 01/06/2018 1341   HGB 15.1 11/25/2017 1208   HGB 14.5 04/10/2010   HCT 43.5 01/06/2018 1341   HCT 44.8 11/25/2017 1208   PLT 189 01/06/2018 1341   MCV 91.2 01/06/2018 1341   MCV 92 11/25/2017 1208   MCH 30.2 01/06/2018 1341   MCHC 33.1 01/06/2018 1341   RDW 13.8 01/06/2018 1341   RDW 13.5 11/25/2017 1208   LYMPHSABS 10.5 (H) 01/06/2018 1341   LYMPHSABS 8.7 (H) 11/25/2017 1208   MONOABS 0.8 01/06/2018 1341   EOSABS 0.3 01/06/2018 1341   EOSABS 0.3 11/25/2017 1208   BASOSABS 0.1 01/06/2018 1341   BASOSABS 0.1 11/25/2017 1208   Iron/TIBC/Ferritin/ %Sat No results found for: IRON, TIBC, FERRITIN, IRONPCTSAT Lipid Panel     Component Value Date/Time   CHOL 215 (H) 03/17/2018 1041   TRIG 191 (H) 03/17/2018 1041   TRIG 152 04/10/2010   HDL 45 03/17/2018 1041   CHOLHDL 5 07/13/2017 1400   VLDL 46.0 (H) 07/13/2017 1400   LDLCALC 132 (H) 03/17/2018 1041   LDLCALC 67  04/10/2010   LDLDIRECT 141.0 07/13/2017 1400   Hepatic Function Panel     Component Value Date/Time   PROT 6.5 03/17/2018 1041   ALBUMIN 4.6 03/17/2018 1041   AST 19 03/17/2018 1041   AST 18 04/10/2010   ALT 18 03/17/2018 1041   ALKPHOS 70 03/17/2018 1041   ALKPHOS 79 04/10/2010   BILITOT 0.5 03/17/2018 1041      Component Value Date/Time   TSH 2.510 11/25/2017 1208   TSH 3.46 06/19/2016 0808   TSH 1.28 09/24/2015 0831    Results for RASHI, GRANIER (MRN [id number]) as of 05/18/2018 10:18  Ref. Range 11/25/2017 12:08  Vitamin D, 25-Hydroxy Latest Ref Range: 30.0 - 100.0 ng/mL 24.4 (L)    I, Marcille Blanco, CMA, am acting as Location manager for [employer], FNP-C.  I have reviewed the above documentation for accuracy and completeness, and I agree with the above.  - Chloe Baig, FNP-C.

## 2018-05-28 DIAGNOSIS — Z8249 Family history of ischemic heart disease and other diseases of the circulatory system: Secondary | ICD-10-CM | POA: Diagnosis not present

## 2018-05-28 DIAGNOSIS — Z87891 Personal history of nicotine dependence: Secondary | ICD-10-CM | POA: Diagnosis not present

## 2018-05-28 DIAGNOSIS — E559 Vitamin D deficiency, unspecified: Secondary | ICD-10-CM | POA: Diagnosis not present

## 2018-05-28 DIAGNOSIS — Z836 Family history of other diseases of the respiratory system: Secondary | ICD-10-CM | POA: Diagnosis not present

## 2018-05-28 DIAGNOSIS — C9510 Chronic leukemia of unspecified cell type not having achieved remission: Secondary | ICD-10-CM | POA: Diagnosis not present

## 2018-05-28 DIAGNOSIS — J309 Allergic rhinitis, unspecified: Secondary | ICD-10-CM | POA: Diagnosis not present

## 2018-05-28 DIAGNOSIS — Z882 Allergy status to sulfonamides status: Secondary | ICD-10-CM | POA: Diagnosis not present

## 2018-06-01 ENCOUNTER — Other Ambulatory Visit: Payer: Self-pay

## 2018-06-01 ENCOUNTER — Encounter (INDEPENDENT_AMBULATORY_CARE_PROVIDER_SITE_OTHER): Payer: Self-pay

## 2018-06-01 ENCOUNTER — Ambulatory Visit (INDEPENDENT_AMBULATORY_CARE_PROVIDER_SITE_OTHER): Payer: Medicare HMO | Admitting: Family Medicine

## 2018-06-01 ENCOUNTER — Encounter (INDEPENDENT_AMBULATORY_CARE_PROVIDER_SITE_OTHER): Payer: Self-pay | Admitting: Family Medicine

## 2018-06-01 DIAGNOSIS — E8881 Metabolic syndrome: Secondary | ICD-10-CM | POA: Diagnosis not present

## 2018-06-01 DIAGNOSIS — E669 Obesity, unspecified: Secondary | ICD-10-CM | POA: Diagnosis not present

## 2018-06-01 DIAGNOSIS — Z6831 Body mass index (BMI) 31.0-31.9, adult: Secondary | ICD-10-CM | POA: Diagnosis not present

## 2018-06-01 NOTE — Progress Notes (Signed)
Office: [phone]  /  Fax: [phone] TeleHealth Visit:  [place] has verbally consented to this TeleHealth visit today. The patient is located at home, the provider is located at the [facility] office. The participants in this visit include the listed provider and patient. The visit was conducted today via Webex. The visit was 17 minutes in length.  HPI:   Chief Complaint: OBESITY Lori Shaw is here to discuss her progress with her obesity treatment plan. She is on the Category 2 plan and is following her eating plan approximately 65-70% of the time. She states she is exercising 0 minutes 0 times per week. Lori Shaw states she is eating most of the protein on her plan and is doing well on the plan. She states she weighed 188 lbs today. We were unable to weigh the patient today for this TeleHealth visit. She feels as if she has lost 2 lbs since her last visit. She has lost 15 lbs since starting treatment with [name].  Insulin Resistance Lori Shaw has a diagnosis of insulin resistance based on her elevated fasting insulin level >5. Although [place]'s blood glucose readings are still under good control, insulin resistance puts her at greater risk of metabolic syndrome and diabetes. She is not taking metformin currently and continues to work on diet and exercise to decrease risk of diabetes.  No polyphagia. Lab Results  Component Value Date   HGBA1C 5.4 03/17/2018   ASSESSMENT AND PLAN:  Insulin resistance  Class 1 obesity with serious comorbidity and body mass index (BMI) of 31.0 to 31.9 in adult, unspecified obesity type  PLAN:  Insulin Resistance Lori Shaw will continue to work on weight loss, exercise, and decreasing simple carbohydrates in her diet to help decrease the risk of diabetes. We dicussed metformin including benefits and risks. She was informed that eating too many simple carbohydrates or too many calories at one sitting increases the likelihood  of GI side effects. Lori Shaw will continue her meal plan and follow-up with [name] as directed to monitor her progress.  I spent > than 50% of the 15 minute visit on counseling as documented in the note.  Obesity Lori Shaw is currently in the action stage of change. As such, her goal is to continue with weight loss efforts. She has agreed to follow the Category 2 plan. Lori Shaw has been instructed to continue yard work for [employer] loss and overall health benefits. We discussed the following Behavioral Modification Strategies today: increasing lean protein intake and planning for success.  Lori Shaw has agreed to follow-up with our clinic in 2 weeks. She was informed of the importance of frequent follow-up visits to maximize her success with intensive lifestyle modifications for her multiple health conditions.  ALLERGIES: Allergies  Allergen Reactions  . Sulfa Antibiotics Nausea Only    MEDICATIONS: Current Outpatient Medications on File Prior to Visit  Medication Sig Dispense Refill  . Cholecalciferol (VITAMIN D) 2000 units CAPS Take 1 capsule by mouth daily.    . Cyanocobalamin (VITAMIN B-12) 2500 MCG SUBL Place 1 tablet (2,500 mcg total) under the tongue daily.  0  . fluticasone (FLONASE) 50 MCG/ACT nasal spray Place 2 sprays into both nostrils daily as needed. 16 g 11  . guaiFENesin-codeine (CHERATUSSIN AC) 100-10 MG/5ML syrup Take 5 mLs by mouth 2 (two) times daily as needed for cough (sedation precautions). 120 mL 0  . methocarbamol (ROBAXIN) 500 MG tablet Take 0.5-1 tablets (250-500 mg total) by mouth 2 (two) times daily as needed for muscle spasms.  30 tablet 0  . Vitamin D, Ergocalciferol, (DRISDOL) 1.25 MG (50000 UT) CAPS capsule Take 1 capsule (50,000 Units total) by mouth once a week. 4 capsule 0   No current facility-administered medications on file prior to visit.     PAST MEDICAL HISTORY: Past Medical History:  Diagnosis Date  . B12 deficiency   . Back pain   . CLL (chronic  lymphocytic leukemia) ([place]) 06/21/2016   Rai stage 0, dx 06/2016 (Dr Janese Banks)  . HLD (hyperlipidemia)   . Joint pain   . Knee problem   . OSA on CPAP 2006, 04/2014   AHI of 23.8 and an RDI of 42.9, on CPAP 9 cm H2O (D[facility]meier)  . Osteopenia 08/2010   T -1.4 at L femur neck  . Piriformis syndrome 12/14/2014  . Vitamin D deficiency     PAST SURGICAL HISTORY: Past Surgical History:  Procedure Laterality Date  . ABDOMINAL HYSTERECTOMY  01/27/1983   for menometrorrhagia, one ovary remained  . COLONOSCOPY  2012   no records available. per patient normal.   . dexa  08/2010   osteopenia  . TONSILLECTOMY  01/26/1973    SOCIAL HISTORY: Social History   Tobacco Use  . Smoking status: Former Smoker    Packs/day: 2.00    Years: 20.00    Pack years: 40.00    Types: Cigarettes    Last attempt to quit: 01/26/1978    Years since quitting: 40.3  . Smokeless tobacco: Never Used  Substance Use Topics  . Alcohol use: No  . Drug use: Yes    Comment: occassionally glass of wine    FAMILY HISTORY: Family History  Problem Relation Age of Onset  . Hyperlipidemia Mother   . CAD Mother        CABG  . Hypertension Mother   . Hyperlipidemia Father   . CAD Father        MI  . Sudden death Father   . Arthritis Maternal Grandmother   . Cancer Sister        breast  . Breast cancer Sister 72  . Alzheimer's disease Unknown   . Heart attack Unknown   . Diabetes Neg Hx   . Stroke Neg Hx    ROS: Review of Systems  Endo/Heme/Allergies:       Negative for polyphagia.   PHYSICAL EXAM: Pt in no acute distress  RECENT LABS AND TESTS: BMET    Component Value Date/Time   NA 142 03/17/2018 1041   K 4.1 03/17/2018 1041   CL 102 03/17/2018 1041   CO2 24 03/17/2018 1041   GLUCOSE 84 03/17/2018 1041   GLUCOSE 96 07/13/2017 1400   BUN 16 03/17/2018 1041   CREATININE 0.78 03/17/2018 1041   CALCIUM 9.3 03/17/2018 1041   GFRNONAA 77 03/17/2018 1041   GFRAA 88 03/17/2018 1041   Lab Results   Component Value Date   HGBA1C 5.4 03/17/2018   HGBA1C 5.5 11/25/2017   Lab Results  Component Value Date   INSULIN 20.9 03/17/2018   INSULIN 6.6 11/25/2017   CBC    Component Value Date/Time   WBC 16.6 (H) 01/06/2018 1341   RBC 4.77 01/06/2018 1341   HGB 14.4 01/06/2018 1341   HGB 15.1 11/25/2017 1208   HGB 14.5 04/10/2010   HCT 43.5 01/06/2018 1341   HCT 44.8 11/25/2017 1208   PLT 189 01/06/2018 1341   MCV 91.2 01/06/2018 1341   MCV 92 11/25/2017 1208   MCH 30.2 01/06/2018 1341  MCHC 33.1 01/06/2018 1341   RDW 13.8 01/06/2018 1341   RDW 13.5 11/25/2017 1208   LYMPHSABS 10.5 (H) 01/06/2018 1341   LYMPHSABS 8.7 (H) 11/25/2017 1208   MONOABS 0.8 01/06/2018 1341   EOSABS 0.3 01/06/2018 1341   EOSABS 0.3 11/25/2017 1208   BASOSABS 0.1 01/06/2018 1341   BASOSABS 0.1 11/25/2017 1208   Iron/TIBC/Ferritin/ %Sat No results found for: IRON, TIBC, FERRITIN, IRONPCTSAT Lipid Panel     Component Value Date/Time   CHOL 215 (H) 03/17/2018 1041   TRIG 191 (H) 03/17/2018 1041   TRIG 152 04/10/2010   HDL 45 03/17/2018 1041   CHOLHDL 5 07/13/2017 1400   VLDL 46.0 (H) 07/13/2017 1400   LDLCALC 132 (H) 03/17/2018 1041   LDLCALC 67 04/10/2010   LDLDIRECT 141.0 07/13/2017 1400   Hepatic Function Panel     Component Value Date/Time   PROT 6.5 03/17/2018 1041   ALBUMIN 4.6 03/17/2018 1041   AST 19 03/17/2018 1041   AST 18 04/10/2010   ALT 18 03/17/2018 1041   ALKPHOS 70 03/17/2018 1041   ALKPHOS 79 04/10/2010   BILITOT 0.5 03/17/2018 1041      Component Value Date/Time   TSH 2.510 11/25/2017 1208   TSH 3.46 06/19/2016 0808   TSH 1.28 09/24/2015 0831   Results for JOSSILYN, BENDA (MRN [id number]) as of 06/01/2018 12:06  Ref. Range 11/25/2017 12:08  Vitamin D, 25-Hydroxy Latest Ref Range: 30.0 - 100.0 ng/mL 24.4 (L)   I, Michaelene Song, am acting as Location manager for [employer], FNP-C.  I have reviewed the above documentation for accuracy and completeness, and  I agree with the above.  - Eron Goble, FNP-C.

## 2018-06-13 ENCOUNTER — Other Ambulatory Visit (INDEPENDENT_AMBULATORY_CARE_PROVIDER_SITE_OTHER): Payer: Self-pay | Admitting: Family Medicine

## 2018-06-13 DIAGNOSIS — E559 Vitamin D deficiency, unspecified: Secondary | ICD-10-CM

## 2018-06-15 ENCOUNTER — Encounter (INDEPENDENT_AMBULATORY_CARE_PROVIDER_SITE_OTHER): Payer: Self-pay | Admitting: Family Medicine

## 2018-06-15 ENCOUNTER — Ambulatory Visit (INDEPENDENT_AMBULATORY_CARE_PROVIDER_SITE_OTHER): Payer: Medicare HMO | Admitting: Family Medicine

## 2018-06-15 ENCOUNTER — Other Ambulatory Visit: Payer: Self-pay

## 2018-06-15 DIAGNOSIS — Z6831 Body mass index (BMI) 31.0-31.9, adult: Secondary | ICD-10-CM

## 2018-06-15 DIAGNOSIS — E559 Vitamin D deficiency, unspecified: Secondary | ICD-10-CM | POA: Diagnosis not present

## 2018-06-15 DIAGNOSIS — E669 Obesity, unspecified: Secondary | ICD-10-CM

## 2018-06-15 DIAGNOSIS — E7849 Other hyperlipidemia: Secondary | ICD-10-CM | POA: Diagnosis not present

## 2018-06-15 MED ORDER — VITAMIN D (ERGOCALCIFEROL) 1.25 MG (50000 UNIT) PO CAPS: 50000.0000 [IU] | ORAL_CAPSULE | ORAL | 0 refills | Status: DC

## 2018-06-15 NOTE — Progress Notes (Signed)
Office: [phone]  /  Fax: [phone] TeleHealth Visit:  [place] has verbally consented to this TeleHealth visit today. The patient is located at home, the provider is located at the [facility] office. The participants in this visit include the listed provider and patient. The visit was conducted today via telephone call (Webex technical problem).  HPI:   Chief Complaint: OBESITY Lori Shaw is here to discuss her progress with her obesity treatment plan. She is on the Category 2 plan and is following her eating plan approximately 50% of the time. She states she is doing some yard work on nice days.  Lori Shaw states she weighed 188 lbs which reflects weight maintenance. She does report not getting all of her protein and sometimes has carbs with dinner. We discussed goal weight and  Decided her goal weight is 175 lbs.  We were unable to weigh the patient today for this TeleHealth visit. She feels as if she has maintained her weight since her last visit. She has lost 15 lbs since starting treatment with [name].  Vitamin D deficiency Lori Shaw has a diagnosis of Vitamin D deficiency, which is not at goal. Her last Vitamin D level was reported to be 24.4 on 11/25/2017. She is currently taking prescription Vit D and denies nausea, vomiting or muscle weakness.  Hyperlipidemia Lori Shaw has hyperlipidemia and is not on a statin. She has been trying to improve her cholesterol levels with intensive lifestyle modification including a low saturated fat diet, exercise and weight loss. Her last LDL was elevated at 132, triglycerides elevated at 191, and HDL low at 45 on 03/17/2018. She denies any chest pain or shortness of breath. The 10-year ASCVD risk score Mikey Bussing DC Brooke Bonito., et al., 2013) is: 8.2%   Values used to calculate the score:     Age: 73 years     Sex: Female     Is Non-Hispanic African American: No     Diabetic: No     Tobacco smoker: No     Systolic Blood Pressure:  109 mmHg     Is BP treated: No     HDL Cholesterol: 45 mg/dL     Total Cholesterol: 215 mg/dL   ASSESSMENT AND PLAN:  Vitamin D deficiency - Plan: Vitamin D, Ergocalciferol, (DRISDOL) 1.25 MG (50000 UT) CAPS capsule  Other hyperlipidemia  Class 1 obesity with serious comorbidity and body mass index (BMI) of 31.0 to 31.9 in adult, unspecified obesity type  PLAN:  Vitamin D Deficiency Lori Shaw was informed that low Vitamin D levels contributes to fatigue and are associated with obesity, breast, and colon cancer. She agrees to continue to take prescription Vit D @ 50,000 IU every week #4 with 0 refills and will follow-up for routine testing of Vitamin D in 4-6 weeks when she comes for an in office visit. She was informed of the risk of over-replacement of Vitamin D and agrees to not increase her dose unless she discusses this with [name] first. Lori Shaw agrees to follow-up with our clinic in 2-3 weeks.  Hyperlipidemia Lori Shaw was informed of the American Heart Association Guidelines emphasizing intensive lifestyle modifications as the first line treatment for hyperlipidemia. We discussed many lifestyle modifications today in depth, and Lori Shaw will continue to work on decreasing saturated fats such as fatty red meat, butter and many fried foods. She will also increase vegetables and lean protein in her diet and continue to work on exercise and weight loss efforts. Lori Shaw will have a fasting lipid panel  checked in 4-6 weeks. Statin is indicated because current ASCVD risk score is 8.2% and we will discuses this after FLP obtained.   Obesity Lori Shaw is currently in the action stage of change. As such, her goal is to continue with weight loss efforts. She has agreed to follow the Category 2 plan. Lori Shaw has been instructed to increase her walking to every other day for weight loss and overall health benefits. We discussed the following Behavioral Modification Strategies today: increasing lean  protein intake, decreasing simple carbohydrates, and planning for success.  Lori Shaw has agreed to follow-up with our clinic in 2-3 weeks. She was informed of the importance of frequent follow-up visits to maximize her success with intensive lifestyle modifications for her multiple health conditions.  ALLERGIES: Allergies  Allergen Reactions  . Sulfa Antibiotics Nausea Only    MEDICATIONS: Current Outpatient Medications on File Prior to Visit  Medication Sig Dispense Refill  . Cholecalciferol (VITAMIN D) 2000 units CAPS Take 1 capsule by mouth daily.    . Cyanocobalamin (VITAMIN B-12) 2500 MCG SUBL Place 1 tablet (2,500 mcg total) under the tongue daily.  0  . fluticasone (FLONASE) 50 MCG/ACT nasal spray Place 2 sprays into both nostrils daily as needed. 16 g 11  . guaiFENesin-codeine (CHERATUSSIN AC) 100-10 MG/5ML syrup Take 5 mLs by mouth 2 (two) times daily as needed for cough (sedation precautions). 120 mL 0  . methocarbamol (ROBAXIN) 500 MG tablet Take 0.5-1 tablets (250-500 mg total) by mouth 2 (two) times daily as needed for muscle spasms. 30 tablet 0   No current facility-administered medications on file prior to visit.     PAST MEDICAL HISTORY: Past Medical History:  Diagnosis Date  . B12 deficiency   . Back pain   . CLL (chronic lymphocytic leukemia) ([place]) 06/21/2016   Rai stage 0, dx 06/2016 (Dr Janese Banks)  . HLD (hyperlipidemia)   . Joint pain   . Knee problem   . OSA on CPAP 2006, 04/2014   AHI of 23.8 and an RDI of 42.9, on CPAP 9 cm H2O (D[facility]meier)  . Osteopenia 08/2010   T -1.4 at L femur neck  . Piriformis syndrome 12/14/2014  . Vitamin D deficiency     PAST SURGICAL HISTORY: Past Surgical History:  Procedure Laterality Date  . ABDOMINAL HYSTERECTOMY  01/27/1983   for menometrorrhagia, one ovary remained  . COLONOSCOPY  2012   no records available. per patient normal.   . dexa  08/2010   osteopenia  . TONSILLECTOMY  01/26/1973    SOCIAL HISTORY: Social  History   Tobacco Use  . Smoking status: Former Smoker    Packs/day: 2.00    Years: 20.00    Pack years: 40.00    Types: Cigarettes    Last attempt to quit: 01/26/1978    Years since quitting: 40.4  . Smokeless tobacco: Never Used  Substance Use Topics  . Alcohol use: No  . Drug use: Yes    Comment: occassionally glass of wine    FAMILY HISTORY: Family History  Problem Relation Age of Onset  . Hyperlipidemia Mother   . CAD Mother        CABG  . Hypertension Mother   . Hyperlipidemia Father   . CAD Father        MI  . Sudden death Father   . Arthritis Maternal Grandmother   . Cancer Sister        breast  . Breast cancer Sister 93  . Alzheimer's disease Unknown   .  Heart attack Unknown   . Diabetes Neg Hx   . Stroke Neg Hx    ROS: Review of Systems  Respiratory: Negative for shortness of breath.   Cardiovascular: Negative for chest pain.  Gastrointestinal: Negative for nausea and vomiting.  Musculoskeletal:       Negative for muscle weakness.   PHYSICAL EXAM: Pt in no acute distress  RECENT LABS AND TESTS: BMET    Component Value Date/Time   NA 142 03/17/2018 1041   K 4.1 03/17/2018 1041   CL 102 03/17/2018 1041   CO2 24 03/17/2018 1041   GLUCOSE 84 03/17/2018 1041   GLUCOSE 96 07/13/2017 1400   BUN 16 03/17/2018 1041   CREATININE 0.78 03/17/2018 1041   CALCIUM 9.3 03/17/2018 1041   GFRNONAA 77 03/17/2018 1041   GFRAA 88 03/17/2018 1041   Lab Results  Component Value Date   HGBA1C 5.4 03/17/2018   HGBA1C 5.5 11/25/2017   Lab Results  Component Value Date   INSULIN 20.9 03/17/2018   INSULIN 6.6 11/25/2017   CBC    Component Value Date/Time   WBC 16.6 (H) 01/06/2018 1341   RBC 4.77 01/06/2018 1341   HGB 14.4 01/06/2018 1341   HGB 15.1 11/25/2017 1208   HGB 14.5 04/10/2010   HCT 43.5 01/06/2018 1341   HCT 44.8 11/25/2017 1208   PLT 189 01/06/2018 1341   MCV 91.2 01/06/2018 1341   MCV 92 11/25/2017 1208   MCH 30.2 01/06/2018 1341   MCHC  33.1 01/06/2018 1341   RDW 13.8 01/06/2018 1341   RDW 13.5 11/25/2017 1208   LYMPHSABS 10.5 (H) 01/06/2018 1341   LYMPHSABS 8.7 (H) 11/25/2017 1208   MONOABS 0.8 01/06/2018 1341   EOSABS 0.3 01/06/2018 1341   EOSABS 0.3 11/25/2017 1208   BASOSABS 0.1 01/06/2018 1341   BASOSABS 0.1 11/25/2017 1208   Iron/TIBC/Ferritin/ %Sat No results found for: IRON, TIBC, FERRITIN, IRONPCTSAT Lipid Panel     Component Value Date/Time   CHOL 215 (H) 03/17/2018 1041   TRIG 191 (H) 03/17/2018 1041   TRIG 152 04/10/2010   HDL 45 03/17/2018 1041   CHOLHDL 5 07/13/2017 1400   VLDL 46.0 (H) 07/13/2017 1400   LDLCALC 132 (H) 03/17/2018 1041   LDLCALC 67 04/10/2010   LDLDIRECT 141.0 07/13/2017 1400   Hepatic Function Panel     Component Value Date/Time   PROT 6.5 03/17/2018 1041   ALBUMIN 4.6 03/17/2018 1041   AST 19 03/17/2018 1041   AST 18 04/10/2010   ALT 18 03/17/2018 1041   ALKPHOS 70 03/17/2018 1041   ALKPHOS 79 04/10/2010   BILITOT 0.5 03/17/2018 1041      Component Value Date/Time   TSH 2.510 11/25/2017 1208   TSH 3.46 06/19/2016 0808   TSH 1.28 09/24/2015 0831   Results for LAYLIANA, DEVINS (MRN [id number]) as of 06/15/2018 14:15  Ref. Range 11/25/2017 12:08  Vitamin D, 25-Hydroxy Latest Ref Range: 30.0 - 100.0 ng/mL 24.4 (L)    I, Michaelene Song, am acting as Location manager for [employer], FNP-C.  I have reviewed the above documentation for accuracy and completeness, and I agree with the above.  - Jaren Vanetten, FNP-C.

## 2018-06-16 ENCOUNTER — Encounter (INDEPENDENT_AMBULATORY_CARE_PROVIDER_SITE_OTHER): Payer: Self-pay | Admitting: Family Medicine

## 2018-06-29 ENCOUNTER — Other Ambulatory Visit: Payer: Self-pay

## 2018-06-29 ENCOUNTER — Ambulatory Visit (INDEPENDENT_AMBULATORY_CARE_PROVIDER_SITE_OTHER): Payer: Medicare HMO | Admitting: Family Medicine

## 2018-06-29 DIAGNOSIS — E7849 Other hyperlipidemia: Secondary | ICD-10-CM | POA: Diagnosis not present

## 2018-06-29 DIAGNOSIS — E669 Obesity, unspecified: Secondary | ICD-10-CM | POA: Diagnosis not present

## 2018-06-29 DIAGNOSIS — Z6831 Body mass index (BMI) 31.0-31.9, adult: Secondary | ICD-10-CM | POA: Diagnosis not present

## 2018-06-29 NOTE — Progress Notes (Signed)
Office: [phone]  /  Fax: [phone] TeleHealth Visit:  [place] has verbally consented to this TeleHealth visit today. The patient is located at home, the provider is located at the [employer] [facility] office. The participants in this visit include the listed provider and patient. The visit was conducted today via Webex.  HPI:   Chief Complaint: OBESITY Lori Shaw is here to discuss her progress with her obesity treatment plan. She is on the  follow the Category 2 plan. She states she is exercising 0 minutes 0 times per week. Lori Shaw has maintained her weight at 188 lbs. She reports her blood pressure is 114/62 and pulse is 58 today. Her goal weight is 175 lbs. She is very frustrated with staying at home and currently is out of foods she needs. She states she will go to the store today. She reports skipping meals. Denies polyphagia.  We were unable to weigh the patient today for this TeleHealth visit. She reports her weight to be 188 lbs. She has lost 15 lbs since starting treatment with [name].  Hyperlipidemia Lori Shaw has hyperlipidemia and has been trying to improve her cholesterol levels with intensive lifestyle modification including a low saturated fat diet, exercise and weight loss. Her last LDL was 132. ASCVD score is 8.2%. We discussed a statin and she has been on Crestor without side effects in the past. The 10-year ASCVD risk score Mikey Bussing DC Brooke Bonito., et al., 2013) is: 8.2%   Values used to calculate the score:     Age: 73 years     Sex: Female     Is Non-Hispanic African American: No     Diabetic: No     Tobacco smoker: No     Systolic Blood Pressure: 670 mmHg     Is BP treated: No     HDL Cholesterol: 45 mg/dL     Total Cholesterol: 215 mg/dL   ASSESSMENT AND PLAN:  Other hyperlipidemia  Class 1 obesity with serious comorbidity and body mass index (BMI) of 32.0 to 32.9 in adult, unspecified obesity type  PLAN:  Hyperlipidemia Lori Shaw was informed  of the American Heart Association Guidelines emphasizing intensive lifestyle modifications as the first line treatment for hyperlipidemia. We discussed many lifestyle modifications today in depth, and Lori Shaw will continue to work on decreasing saturated fats such as fatty red meat, butter and many fried foods. She will also increase vegetables and lean protein in her diet and continue to work on exercise and weight loss efforts. Lori Shaw wants to wait until her next fasting lipid panel and then reassess need for statin.  Obesity Lori Shaw is currently in the action stage of change. As such, her goal is to continue with weight loss efforts. She has agreed to follow the Category 2 plan. She was advised to eat a yogurt rather than skipping a meal. She may use extra calories for wine. Lori Shaw has been instructed to walk 2 days a week for weight loss and overall health benefits. We discussed the following Behavioral Modification Strategies today: increasing lean protein intake, no skipping meals, and planning for success.  Lori Shaw has agreed to follow-up with our clinic in 2 weeks. She was informed of the importance of frequent follow-up visits to maximize her success with intensive lifestyle modifications for her multiple health conditions.  ALLERGIES: Allergies  Allergen Reactions  . Sulfa Antibiotics Nausea Only    MEDICATIONS: Current Outpatient Medications on File Prior to Visit  Medication Sig Dispense Refill  . Cholecalciferol (VITAMIN  D) 2000 units CAPS Take 1 capsule by mouth daily.    . Cyanocobalamin (VITAMIN B-12) 2500 MCG SUBL Place 1 tablet (2,500 mcg total) under the tongue daily.  0  . fluticasone (FLONASE) 50 MCG/ACT nasal spray Place 2 sprays into both nostrils daily as needed. 16 g 11  . guaiFENesin-codeine (CHERATUSSIN AC) 100-10 MG/5ML syrup Take 5 mLs by mouth 2 (two) times daily as needed for cough (sedation precautions). 120 mL 0  . methocarbamol (ROBAXIN) 500 MG tablet  Take 0.5-1 tablets (250-500 mg total) by mouth 2 (two) times daily as needed for muscle spasms. 30 tablet 0  . Vitamin D, Ergocalciferol, (DRISDOL) 1.25 MG (50000 UT) CAPS capsule Take 1 capsule (50,000 Units total) by mouth once a week. 4 capsule 0   No current facility-administered medications on file prior to visit.     PAST MEDICAL HISTORY: Past Medical History:  Diagnosis Date  . B12 deficiency   . Back pain   . CLL (chronic lymphocytic leukemia) ([place]) 06/21/2016   Rai stage 0, dx 06/2016 (Dr Janese Banks)  . HLD (hyperlipidemia)   . Joint pain   . Knee problem   . OSA on CPAP 2006, 04/2014   AHI of 23.8 and an RDI of 42.9, on CPAP 9 cm H2O (D[facility])  . Osteopenia 08/2010   T -1.4 at L femur neck  . Piriformis syndrome 12/14/2014  . Vitamin D deficiency     PAST SURGICAL HISTORY: Past Surgical History:  Procedure Laterality Date  . ABDOMINAL HYSTERECTOMY  01/27/1983   for menometrorrhagia, one ovary remained  . COLONOSCOPY  2012   no records available. per patient normal.   . dexa  08/2010   osteopenia  . TONSILLECTOMY  01/26/1973    SOCIAL HISTORY: Social History   Tobacco Use  . Smoking status: Former Smoker    Packs/day: 2.00    Years: 20.00    Pack years: 40.00    Types: Cigarettes    Last attempt to quit: 01/26/1978    Years since quitting: 40.4  . Smokeless tobacco: Never Used  Substance Use Topics  . Alcohol use: No  . Drug use: Yes    Comment: occassionally glass of wine    FAMILY HISTORY: Family History  Problem Relation Age of Onset  . Hyperlipidemia Mother   . CAD Mother        CABG  . Hypertension Mother   . Hyperlipidemia Father   . CAD Father        MI  . Sudden death Father   . Arthritis Maternal Grandmother   . Cancer Sister        breast  . Breast cancer Sister 82  . Alzheimer's disease Unknown   . Heart attack Unknown   . Diabetes Neg Hx   . Stroke Neg Hx    ROS: Review of Systems  Endo/Heme/Allergies:       Negative for polyphagia.    PHYSICAL EXAM: Pt in no acute distress  RECENT LABS AND TESTS: BMET    Component Value Date/Time   NA 142 03/17/2018 1041   K 4.1 03/17/2018 1041   CL 102 03/17/2018 1041   CO2 24 03/17/2018 1041   GLUCOSE 84 03/17/2018 1041   GLUCOSE 96 07/13/2017 1400   BUN 16 03/17/2018 1041   CREATININE 0.78 03/17/2018 1041   CALCIUM 9.3 03/17/2018 1041   GFRNONAA 77 03/17/2018 1041   GFRAA 88 03/17/2018 1041   Lab Results  Component Value Date   HGBA1C  5.4 03/17/2018   HGBA1C 5.5 11/25/2017   Lab Results  Component Value Date   INSULIN 20.9 03/17/2018   INSULIN 6.6 11/25/2017   CBC    Component Value Date/Time   WBC 16.6 (H) 01/06/2018 1341   RBC 4.77 01/06/2018 1341   HGB 14.4 01/06/2018 1341   HGB 15.1 11/25/2017 1208   HGB 14.5 04/10/2010   HCT 43.5 01/06/2018 1341   HCT 44.8 11/25/2017 1208   PLT 189 01/06/2018 1341   MCV 91.2 01/06/2018 1341   MCV 92 11/25/2017 1208   MCH 30.2 01/06/2018 1341   MCHC 33.1 01/06/2018 1341   RDW 13.8 01/06/2018 1341   RDW 13.5 11/25/2017 1208   LYMPHSABS 10.5 (H) 01/06/2018 1341   LYMPHSABS 8.7 (H) 11/25/2017 1208   MONOABS 0.8 01/06/2018 1341   EOSABS 0.3 01/06/2018 1341   EOSABS 0.3 11/25/2017 1208   BASOSABS 0.1 01/06/2018 1341   BASOSABS 0.1 11/25/2017 1208   Iron/TIBC/Ferritin/ %Sat No results found for: IRON, TIBC, FERRITIN, IRONPCTSAT Lipid Panel     Component Value Date/Time   CHOL 215 (H) 03/17/2018 1041   TRIG 191 (H) 03/17/2018 1041   TRIG 152 04/10/2010   HDL 45 03/17/2018 1041   CHOLHDL 5 07/13/2017 1400   VLDL 46.0 (H) 07/13/2017 1400   LDLCALC 132 (H) 03/17/2018 1041   LDLCALC 67 04/10/2010   LDLDIRECT 141.0 07/13/2017 1400   Hepatic Function Panel     Component Value Date/Time   PROT 6.5 03/17/2018 1041   ALBUMIN 4.6 03/17/2018 1041   AST 19 03/17/2018 1041   AST 18 04/10/2010   ALT 18 03/17/2018 1041   ALKPHOS 70 03/17/2018 1041   ALKPHOS 79 04/10/2010   BILITOT 0.5 03/17/2018 1041       Component Value Date/Time   TSH 2.510 11/25/2017 1208   TSH 3.46 06/19/2016 0808   TSH 1.28 09/24/2015 0831   Results for VETRA, SHINALL (MRN [id number]) as of 06/29/2018 14:37  Ref. Range 11/25/2017 12:08  Vitamin D, 25-Hydroxy Latest Ref Range: 30.0 - 100.0 ng/mL 24.4 (L)    I, Michaelene Song, am acting as Location manager for [employer], FNP-C.  I have reviewed the above documentation for accuracy and completeness, and I agree with the above.  - Heide Brossart, FNP-C.

## 2018-06-30 ENCOUNTER — Encounter (INDEPENDENT_AMBULATORY_CARE_PROVIDER_SITE_OTHER): Payer: Self-pay | Admitting: Family Medicine

## 2018-07-08 ENCOUNTER — Other Ambulatory Visit: Payer: Self-pay

## 2018-07-08 ENCOUNTER — Inpatient Hospital Stay: Payer: Medicare HMO | Attending: Oncology

## 2018-07-08 DIAGNOSIS — C9110 Chronic lymphocytic leukemia of B-cell type not having achieved remission: Secondary | ICD-10-CM | POA: Diagnosis not present

## 2018-07-08 LAB — CBC WITH DIFFERENTIAL/PLATELET
Abs Immature Granulocytes: 0.07 10*3/uL (ref 0.00–0.07)
Basophils Absolute: 0.1 10*3/uL (ref 0.0–0.1)
Basophils Relative: 1 %
Eosinophils Absolute: 0.3 10*3/uL (ref 0.0–0.5)
Eosinophils Relative: 1 %
HCT: 46.1 % — ABNORMAL HIGH (ref 36.0–46.0)
Hemoglobin: 15.4 g/dL — ABNORMAL HIGH (ref 12.0–15.0)
Immature Granulocytes: 0 %
Lymphocytes Relative: 62 %
Lymphs Abs: 12.8 10*3/uL — ABNORMAL HIGH (ref 0.7–4.0)
MCH: 31.1 pg (ref 26.0–34.0)
MCHC: 33.4 g/dL (ref 30.0–36.0)
MCV: 93.1 fL (ref 80.0–100.0)
Monocytes Absolute: 2.4 10*3/uL — ABNORMAL HIGH (ref 0.1–1.0)
Monocytes Relative: 11 %
Neutro Abs: 5.1 10*3/uL (ref 1.7–7.7)
Neutrophils Relative %: 25 %
Platelets: 175 10*3/uL (ref 150–400)
RBC: 4.95 MIL/uL (ref 3.87–5.11)
RDW: 14 % (ref 11.5–15.5)
Smear Review: NORMAL
WBC: 20.7 10*3/uL — ABNORMAL HIGH (ref 4.0–10.5)
nRBC: 0 % (ref 0.0–0.2)

## 2018-07-08 LAB — COMPREHENSIVE METABOLIC PANEL
ALT: 17 U/L (ref 0–44)
AST: 17 U/L (ref 15–41)
Albumin: 4.5 g/dL (ref 3.5–5.0)
Alkaline Phosphatase: 63 U/L (ref 38–126)
Anion gap: 9 (ref 5–15)
BUN: 20 mg/dL (ref 8–23)
CO2: 25 mmol/L (ref 22–32)
Calcium: 9.2 mg/dL (ref 8.9–10.3)
Chloride: 106 mmol/L (ref 98–111)
Creatinine, Ser: 0.78 mg/dL (ref 0.44–1.00)
GFR calc Af Amer: >60 mL/min (ref >60)
GFR calc non Af Amer: >60 mL/min (ref >60)
Glucose, Bld: 94 mg/dL (ref 70–99)
Potassium: 4.5 mmol/L (ref 3.5–5.1)
Sodium: 140 mmol/L (ref 135–145)
Total Bilirubin: 0.7 mg/dL (ref 0.3–1.2)
Total Protein: 7 g/dL (ref 6.5–8.1)

## 2018-07-12 ENCOUNTER — Telehealth (INDEPENDENT_AMBULATORY_CARE_PROVIDER_SITE_OTHER): Payer: Medicare HMO | Admitting: Physician Assistant

## 2018-07-12 ENCOUNTER — Encounter (INDEPENDENT_AMBULATORY_CARE_PROVIDER_SITE_OTHER): Payer: Self-pay | Admitting: Physician Assistant

## 2018-07-12 ENCOUNTER — Other Ambulatory Visit: Payer: Self-pay

## 2018-07-12 DIAGNOSIS — E559 Vitamin D deficiency, unspecified: Secondary | ICD-10-CM

## 2018-07-12 DIAGNOSIS — Z6831 Body mass index (BMI) 31.0-31.9, adult: Secondary | ICD-10-CM | POA: Diagnosis not present

## 2018-07-12 DIAGNOSIS — E8881 Metabolic syndrome: Secondary | ICD-10-CM | POA: Diagnosis not present

## 2018-07-12 DIAGNOSIS — E669 Obesity, unspecified: Secondary | ICD-10-CM

## 2018-07-13 NOTE — Progress Notes (Signed)
Office: [phone]  /  Fax: [phone] TeleHealth Visit:  Lori Shaw has verbally consented to this TeleHealth visit today. The patient is located at home, the provider is located at the [employer] [facility] office. The participants in this visit include the listed provider and patient. The visit was conducted today via Webex.  HPI:   Chief Complaint: OBESITY Lori Shaw is here to discuss her progress with her obesity treatment plan. She is on the Category 2 plan and is following her eating plan approximately 60% of the time. She states she is walking 4 blocks 3 days a week. Lori Shaw reports her weight today to be 187 lbs. She reports that sometimes she skips lunch but is doing a better job getting more protein in. She is using fruit for her snack calories.  We were unable to weigh the patient today for this TeleHealth visit. She feels as if she has lost 1 lb since her last visit. She has lost 15 lbs since starting treatment with Lori Shaw.  Vitamin D deficiency Lori Shaw has a diagnosis of Vitamin D deficiency. She is currently taking prescription and OTC Vit D and denies nausea, vomiting or muscle weakness.  Insulin Resistance Lori Shaw has a diagnosis of insulin resistance based on her elevated fasting insulin level >5. Although Lori Shaw's blood glucose readings are still under good control, insulin resistance puts her at greater risk of metabolic syndrome and diabetes. She is on no medication currently and continues to work on diet and exercise to decrease risk of diabetes. No polyphagia.  ASSESSMENT AND PLAN:  No diagnosis found.  PLAN:  Vitamin D Deficiency Lori Shaw was informed that low Vitamin D levels contributes to fatigue and are associated with obesity, breast, and colon cancer. She agrees to continue to take prescription Vit D @ 50,000 IU every week #4 with 0 refills and will follow-up for routine testing of Vitamin D, at least 2-3 times per year. She was informed  of the risk of over-replacement of Vitamin D and agrees to not increase her dose unless she discusses this with Lori Shaw first. Lori Shaw agrees to follow-up with our clinic in 2 weeks.  Insulin Resistance Lori Shaw will continue to work on weight loss, exercise, and decreasing simple carbohydrates in her diet to help decrease the risk of diabetes. We dicussed metformin including benefits and risks. She was informed that eating too many simple carbohydrates or too many calories at one sitting increases the likelihood of GI side effects. Lori Shaw will continue with weight loss and follow-up with Lori Shaw as directed to monitor her progress.  Obesity Lori Shaw is currently in the action stage of change. As such, her goal is to continue with weight loss efforts. She has agreed to follow the Category 2 plan. Lori Shaw has been instructed to work up to a goal of 150 minutes of combined cardio and strengthening exercise per week for weight loss and overall health benefits. We discussed the following Behavioral Modification Strategies today: no skipping meals, work on meal planning and easy cooking plans.  Lori Shaw has agreed to follow-up with our clinic in 2 weeks. She was informed of the importance of frequent follow-up visits to maximize her success with intensive lifestyle modifications for her multiple health conditions.  ALLERGIES: Allergies  Allergen Reactions   Sulfa Antibiotics Nausea Only    MEDICATIONS: Current Outpatient Medications on File Prior to Visit  Medication Sig Dispense Refill   Cholecalciferol (VITAMIN D) 2000 units CAPS Take 1 capsule by mouth daily.     Cyanocobalamin (  VITAMIN B-12) 2500 MCG SUBL Place 1 tablet (2,500 mcg total) under the tongue daily.  0   fluticasone (FLONASE) 50 MCG/ACT nasal spray Place 2 sprays into both nostrils daily as needed. 16 g 11   Vitamin D, Ergocalciferol, (DRISDOL) 1.25 MG (50000 UT) CAPS capsule Take 1 capsule (50,000 Units total) by mouth once a  week. 4 capsule 0   guaiFENesin-codeine (CHERATUSSIN AC) 100-10 MG/5ML syrup Take 5 mLs by mouth 2 (two) times daily as needed for cough (sedation precautions). (Patient not taking: Reported on 07/12/2018) 120 mL 0   methocarbamol (ROBAXIN) 500 MG tablet Take 0.5-1 tablets (250-500 mg total) by mouth 2 (two) times daily as needed for muscle spasms. 30 tablet 0   No current facility-administered medications on file prior to visit.     PAST MEDICAL HISTORY: Past Medical History:  Diagnosis Date   B12 deficiency    Back pain    CLL (chronic lymphocytic leukemia) ([place]) 06/21/2016   Rai stage 0, dx 06/2016 (Dr Janese Banks)   HLD (hyperlipidemia)    Joint pain    Knee problem    OSA on CPAP 2006, 04/2014   AHI of 23.8 and an RDI of 42.9, on CPAP 9 cm H2O ([place])   Osteopenia 08/2010   T -1.4 at L femur neck   Piriformis syndrome 12/14/2014   Vitamin D deficiency     PAST SURGICAL HISTORY: Past Surgical History:  Procedure Laterality Date   ABDOMINAL HYSTERECTOMY  01/27/1983   for menometrorrhagia, one ovary remained   COLONOSCOPY  2012   no records available. per patient normal.    dexa  08/2010   osteopenia   TONSILLECTOMY  01/26/1973    SOCIAL HISTORY: Social History   Tobacco Use   Smoking status: Former Smoker    Packs/day: 2.00    Years: 20.00    Pack years: 40.00    Types: Cigarettes    Quit date: 01/26/1978    Years since quitting: 40.4   Smokeless tobacco: Never Used  Substance Use Topics   Alcohol use: No   Drug use: Yes    Comment: occassionally glass of wine    FAMILY HISTORY: Family History  Problem Relation Age of Onset   Hyperlipidemia Mother    CAD Mother        CABG   Hypertension Mother    Hyperlipidemia Father    CAD Father        MI   Sudden death Father    Arthritis Maternal Grandmother    Cancer Sister        breast   Breast cancer Sister 62   Alzheimer's disease Unknown    Heart attack Unknown    Diabetes Neg  Hx    Stroke Neg Hx    ROS: Review of Systems  Gastrointestinal: Negative for nausea and vomiting.  Musculoskeletal:       Negative for muscle weakness.  Endo/Heme/Allergies:       Negative for polyphagia.   PHYSICAL EXAM: Pt in no acute distress  RECENT LABS AND TESTS: BMET    Component Value Date/Time   NA 140 07/08/2018 1445   NA 142 03/17/2018 1041   K 4.5 07/08/2018 1445   CL 106 07/08/2018 1445   CO2 25 07/08/2018 1445   GLUCOSE 94 07/08/2018 1445   BUN 20 07/08/2018 1445   BUN 16 03/17/2018 1041   CREATININE 0.78 07/08/2018 1445   CALCIUM 9.2 07/08/2018 1445   GFRNONAA >60 07/08/2018 1445   GFRAA >  60 07/08/2018 1445   Lab Results  Component Value Date   HGBA1C 5.4 03/17/2018   HGBA1C 5.5 11/25/2017   Lab Results  Component Value Date   INSULIN 20.9 03/17/2018   INSULIN 6.6 11/25/2017   CBC    Component Value Date/Time   WBC 20.7 (H) 07/08/2018 1445   RBC 4.95 07/08/2018 1445   HGB 15.4 (H) 07/08/2018 1445   HGB 15.1 11/25/2017 1208   HGB 14.5 04/10/2010   HCT 46.1 (H) 07/08/2018 1445   HCT 44.8 11/25/2017 1208   PLT 175 07/08/2018 1445   MCV 93.1 07/08/2018 1445   MCV 92 11/25/2017 1208   MCH 31.1 07/08/2018 1445   MCHC 33.4 07/08/2018 1445   RDW 14.0 07/08/2018 1445   RDW 13.5 11/25/2017 1208   LYMPHSABS 12.8 (H) 07/08/2018 1445   LYMPHSABS 8.7 (H) 11/25/2017 1208   MONOABS 2.4 (H) 07/08/2018 1445   EOSABS 0.3 07/08/2018 1445   EOSABS 0.3 11/25/2017 1208   BASOSABS 0.1 07/08/2018 1445   BASOSABS 0.1 11/25/2017 1208   Iron/TIBC/Ferritin/ %Sat No results found for: IRON, TIBC, FERRITIN, IRONPCTSAT Lipid Panel     Component Value Date/Time   CHOL 215 (H) 03/17/2018 1041   TRIG 191 (H) 03/17/2018 1041   TRIG 152 04/10/2010   HDL 45 03/17/2018 1041   CHOLHDL 5 07/13/2017 1400   VLDL 46.0 (H) 07/13/2017 1400   LDLCALC 132 (H) 03/17/2018 1041   LDLCALC 67 04/10/2010   LDLDIRECT 141.0 07/13/2017 1400   Hepatic Function Panel       Component Value Date/Time   PROT 7.0 07/08/2018 1445   PROT 6.5 03/17/2018 1041   ALBUMIN 4.5 07/08/2018 1445   ALBUMIN 4.6 03/17/2018 1041   AST 17 07/08/2018 1445   AST 18 04/10/2010   ALT 17 07/08/2018 1445   ALKPHOS 63 07/08/2018 1445   ALKPHOS 79 04/10/2010   BILITOT 0.7 07/08/2018 1445   BILITOT 0.5 03/17/2018 1041      Component Value Date/Time   TSH 2.510 11/25/2017 1208   TSH 3.46 06/19/2016 0808   TSH 1.28 09/24/2015 0831   Results for SAMIAH, RICKLEFS (MRN [id number]) as of 07/13/2018 15:25  Ref. Range 11/25/2017 12:08  Vitamin D, 25-Hydroxy Latest Ref Range: 30.0 - 100.0 ng/mL 24.4 (L)    I, Michaelene Song, am acting as Location manager for [employer], PA-C I, Abby Potash, PA-C have reviewed above note and agree with its content

## 2018-07-14 ENCOUNTER — Other Ambulatory Visit (INDEPENDENT_AMBULATORY_CARE_PROVIDER_SITE_OTHER): Payer: Self-pay | Admitting: Family Medicine

## 2018-07-14 DIAGNOSIS — E559 Vitamin D deficiency, unspecified: Secondary | ICD-10-CM

## 2018-07-14 MED ORDER — VITAMIN D (ERGOCALCIFEROL) 1.25 MG (50000 UNIT) PO CAPS: 50000.0000 [IU] | ORAL_CAPSULE | ORAL | 0 refills | Status: DC

## 2018-07-15 ENCOUNTER — Other Ambulatory Visit: Payer: Self-pay

## 2018-07-15 ENCOUNTER — Encounter: Payer: Self-pay | Admitting: Family Medicine

## 2018-07-15 ENCOUNTER — Ambulatory Visit: Payer: Medicare HMO

## 2018-07-15 ENCOUNTER — Ambulatory Visit (INDEPENDENT_AMBULATORY_CARE_PROVIDER_SITE_OTHER): Payer: Medicare HMO | Admitting: Family Medicine

## 2018-07-15 VITALS — BP 114/68 | HR 82 | Temp 98.2°F | Ht 64.25 in | Wt 187.4 lb

## 2018-07-15 DIAGNOSIS — Z0000 Encounter for general adult medical examination without abnormal findings: Secondary | ICD-10-CM | POA: Diagnosis not present

## 2018-07-15 DIAGNOSIS — E669 Obesity, unspecified: Secondary | ICD-10-CM

## 2018-07-15 DIAGNOSIS — M8580 Other specified disorders of bone density and structure, unspecified site: Secondary | ICD-10-CM

## 2018-07-15 DIAGNOSIS — E559 Vitamin D deficiency, unspecified: Secondary | ICD-10-CM

## 2018-07-15 DIAGNOSIS — E538 Deficiency of other specified B group vitamins: Secondary | ICD-10-CM | POA: Diagnosis not present

## 2018-07-15 DIAGNOSIS — E785 Hyperlipidemia, unspecified: Secondary | ICD-10-CM | POA: Diagnosis not present

## 2018-07-15 DIAGNOSIS — C9110 Chronic lymphocytic leukemia of B-cell type not having achieved remission: Secondary | ICD-10-CM

## 2018-07-15 DIAGNOSIS — Z7189 Other specified counseling: Secondary | ICD-10-CM

## 2018-07-15 DIAGNOSIS — Z1239 Encounter for other screening for malignant neoplasm of breast: Secondary | ICD-10-CM

## 2018-07-15 NOTE — Progress Notes (Signed)
This visit was conducted in person.  BP 114/68 (BP Location: Left Arm, Patient Position: Sitting, Cuff Size: Normal)   Pulse 82   Temp 98.2 F (36.8 C) (Tympanic)   Ht 5' 4.25" (1.632 m)   Wt 187 lb 6 oz (85 kg)   SpO2 96%   BMI 31.91 kg/m    CC: AMW/CPE Subjective:    Patient ID: Lori Shaw, female    DOB: 11-20-1945, 73 y.o.   MRN: [id number]  HPI: Lori Shaw is a 73 y.o. female presenting on 07/15/2018 for Medicare Wellness   Did not see Katha Cabal this year. Planning to have whole body donated.  Passes depression and fall screens  Hearing Screening   125Hz  250Hz  500Hz  1000Hz  2000Hz  3000Hz  4000Hz  6000Hz  8000Hz   Right ear:   40 40 40  25    Left ear:   40 40 25  20      Visual Acuity Screening   Right eye Left eye Both eyes  Without correction:     With correction: 20/50 20/25 20/30    Known CLL, B12 deficiency on SL 2551mcg daily. Last B12 shot was 02/2017.   [facility] - 1200cal/day diet, 85 gm protein per day. Satisfied with weight loss to date.   Painful bunion on left as well as hammer toes - requests referral to podiatry.   Preventative: Colon cancer screening - ~2010 per pt normal, rec rpt 10 yrs - done in [place] - no records available.  Well woman exam - s/p hysterectomy 1985 with 1 ovary remaining.No longer receives pap smears. Mammogram - 05/2017 [facility] Dexa Date: 08/2010 osteopenia. Drinks milk and eats yogurt daily.H/o low vit D in past. She does get weight bearing exercise. Recommend DEXA 2020.  Flu shot yearly Pneumovax 10/2012 , prevnar 2017 Tdap 03/2006 zostavax 2013 Shingrix - discussed Advanced directive received and scanned 07/2016 - HCPOA is husband Energy manager then [employer]. Does not want prolonged life support if incurable condition.  Seat belt use discussed Sunscreen use discussed. No changing moles on skin. Ex-smoker quit 1980. Daughter smokes at home.  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 Working on [employer].     Relevant past medical, surgical, family and social history reviewed and updated as indicated. Interim medical history since our last visit reviewed. Allergies and medications reviewed and updated. Outpatient Medications Prior to Visit  Medication Sig Dispense Refill  . Cholecalciferol (VITAMIN D) 2000 units CAPS Take 1 capsule by mouth daily.    . Cyanocobalamin (VITAMIN B-12) 2500 MCG SUBL Place 1 tablet (2,500 mcg total) under the tongue daily.  0  . fluticasone (FLONASE) 50 MCG/ACT nasal spray Place 2 sprays into both nostrils daily as needed. 16 g 11  . Vitamin D, Ergocalciferol, (DRISDOL) 1.25 MG (50000 UT) CAPS capsule Take 1 capsule (50,000 Units total) by mouth once a week. 4 capsule 0  . guaiFENesin-codeine (CHERATUSSIN AC) 100-10 MG/5ML syrup Take 5 mLs by mouth 2 (two) times daily as needed for cough (sedation precautions). 120 mL 0  . methocarbamol (ROBAXIN) 500 MG tablet Take 0.5-1 tablets (250-500 mg total) by mouth 2 (two) times daily as needed for muscle spasms. 30 tablet 0  No facility-administered medications prior to visit.      Per HPI unless specifically indicated in ROS section below Review of Systems  Constitutional: Negative for activity change, appetite change, chills, fatigue, fever and unexpected weight change.  HENT: Negative for hearing loss.   Eyes: Negative for visual disturbance.  Respiratory: Negative for cough, chest tightness, shortness of breath and wheezing.   Cardiovascular: Negative for chest pain, palpitations and leg swelling.  Gastrointestinal: Negative for abdominal distention, abdominal pain, blood in stool, constipation,  diarrhea, nausea and vomiting.  Genitourinary: Negative for difficulty urinating and hematuria.  Musculoskeletal: Negative for arthralgias, myalgias and neck pain.  Skin: Negative for rash.  Neurological: Negative for dizziness, seizures, syncope and headaches.  Hematological: Negative for adenopathy. Does not bruise/bleed easily.  Psychiatric/Behavioral: Negative for dysphoric mood. The patient is not nervous/anxious.    Objective:    BP 114/68 (BP Location: Left Arm, Patient Position: Sitting, Cuff Size: Normal)   Pulse 82   Temp 98.2 F (36.8 C) (Tympanic)   Ht 5' 4.25" (1.632 m)   Wt 187 lb 6 oz (85 kg)   SpO2 96%   BMI 31.91 kg/m   Wt Readings from Last 3 Encounters:  07/15/18 187 lb 6 oz (85 kg)  03/31/18 188 lb (85.3 kg)  03/17/18 192 lb (87.1 kg)    Physical Exam Vitals signs and nursing note reviewed.  Constitutional:      General: She is not in acute distress.    Appearance: Normal appearance. She is well-developed.  HENT:     Head: Normocephalic and atraumatic.     Right Ear: Hearing, tympanic membrane, ear canal and external ear normal.     Left Ear: Hearing, tympanic membrane, ear canal and external ear normal.     Nose: Nose normal.     Mouth/Throat:     Mouth: Mucous membranes are moist.     Pharynx: Uvula midline. No oropharyngeal exudate or posterior oropharyngeal erythema.  Eyes:     General: No scleral icterus.    Extraocular Movements: Extraocular movements intact.     Conjunctiva/sclera: Conjunctivae normal.     Pupils: Pupils are equal, round, and reactive to light.  Neck:     Musculoskeletal: Normal range of motion and neck supple.     Vascular: No carotid bruit.  Cardiovascular:     Rate and Rhythm: Normal rate and regular rhythm.     Pulses: Normal pulses.          Radial pulses are 2+ on the right side and 2+ on the left side.     Heart sounds: Normal heart sounds. No murmur.  Pulmonary:     Effort: Pulmonary effort is normal. No  respiratory distress.     Breath sounds: Normal breath sounds. No wheezing, rhonchi or rales.  Abdominal:     General: Bowel sounds are normal. There is no distension.     Palpations: Abdomen is soft. There is no mass.     Tenderness: There is no abdominal tenderness. There is no guarding or rebound.     Hernia: No hernia is present.  Musculoskeletal: Normal range of motion.     Right lower leg: No edema.     Left lower leg: No edema.  Lymphadenopathy:     Cervical: No cervical adenopathy.  Skin:    General: Skin is warm and dry.     Findings: No rash.  Neurological:     General: No focal deficit present.     Mental Status:  She is alert and oriented to person, place, and time.     Comments: CN grossly intact, station and gait intact Recall 2/3, 3/3 with cue Calculation 5/5 serial 3s  Psychiatric:        Mood and Affect: Mood normal.        Behavior: Behavior normal.        Thought Content: Thought content normal.        Judgment: Judgment normal.       Results for orders placed or performed in visit on 07/15/18  VITAMIN D 25 Hydroxy (Vit-D Deficiency, Fractures)  Result Value Ref Range   Vit D, 25-Hydroxy 52 30 - 100 ng/mL  Vitamin B12  Result Value Ref Range   Vitamin B-12 491 200 - 1,100 pg/mL  Lipid panel  Result Value Ref Range   Cholesterol 247 (H) <200 mg/dL   HDL 53 > OR = 50 mg/dL   Triglycerides 239 (H) <150 mg/dL   LDL Cholesterol (Calc) 153 (H) mg/dL (calc)   Total CHOL/HDL Ratio 4.7 <5.0 (calc)   Non-HDL Cholesterol (Calc) 194 (H) <130 mg/dL (calc)   Assessment & Plan:   Problem List Items Addressed This Visit    Vitamin D deficiency    Continues oral replacement - update labs.       Relevant Orders   VITAMIN D 25 Hydroxy (Vit-D Deficiency, Fractures) (Completed)   Vitamin B12 deficiency    Continues oral replacement. Update labs.       Relevant Orders   Vitamin B12 (Completed)   Osteopenia    Reviewed calcium/vit D dosing and regular weight  bearing exercise.  She is taking both daily and weekly vit D.  Update DEXA.       Relevant Orders   DG Bone Density   Obesity, Class I, BMI 30-34.9    Pt motivated for ongoing weight loss - followed by healthy weight and wellness clinic.      Medicare annual wellness visit, subsequent - Primary    I have personally reviewed the Medicare Annual Wellness questionnaire and have noted 1. The patient's medical and social history 2. Their use of alcohol, tobacco or illicit drugs 3. Their current medications and supplements 4. The patient's functional ability including ADL's, fall risks, home safety risks and hearing or visual impairment. Cognitive function has been assessed and addressed as indicated.  5. Diet and physical activity 6. Evidence for depression or mood disorders The patients weight, height, BMI have been recorded in the chart. I have made referrals, counseling and provided education to the patient based on review of the above and I have provided the pt with a written personalized care plan for preventive services. Provider list updated.. See scanned questionairre as needed for further documentation. Reviewed preventative protocols and updated unless pt declined.       HLD (hyperlipidemia)    Update FLP. The 10-year ASCVD risk score Mikey Bussing DC Brooke Bonito., et al., 2013) is: 9%   Values used to calculate the score:     Age: 33 years     Sex: Female     Is Non-Hispanic African American: No     Diabetic: No     Tobacco smoker: No     Systolic Blood Pressure: 937 mmHg     Is BP treated: No     HDL Cholesterol: 53 mg/dL     Total Cholesterol: 247 mg/dL       Relevant Orders   Lipid panel (Completed)   Health maintenance examination  Preventative protocols reviewed and updated unless pt declined. Discussed healthy diet and lifestyle.       CLL (chronic lymphocytic leukemia) ([place])    Appreciate onc care.      Advanced care planning/counseling discussion    Advanced  directive received and scanned 07/2016 - HCPOA is husband Nadezhda Pollitt then [employer]. Does not want prolonged life support if incurable condition.        Other Visit Diagnoses    Breast cancer screening       Relevant Orders   MM Digital Screening       No orders of the defined types were placed in this encounter.  Orders Placed This Encounter  Procedures  . DG Bone Density    Standing Status:   Future    Standing Expiration Date:   09/15/2019    Order Specific Question:   Reason for Exam (SYMPTOM  OR DIAGNOSIS REQUIRED)    Answer:   f/u osteopenia    Order Specific Question:   Preferred imaging location?    Answer:   [facility]  . MM Digital Screening    Standing Status:   Future    Standing Expiration Date:   09/15/2019    Order Specific Question:   Reason for Exam (SYMPTOM  OR DIAGNOSIS REQUIRED)    Answer:   breast cancer screen    Order Specific Question:   Preferred imaging location?    Answer:   [facility]  . VITAMIN D 25 Hydroxy (Vit-D Deficiency, Fractures)  . Vitamin B12  . Lipid panel    Follow up plan: Return for annual exam, prior fasting for blood work, medicare wellness visit.  Ria Bush, MD

## 2018-07-15 NOTE — Patient Instructions (Addendum)
We will refer you for dexa scan on same day as mammogram Labs today.  If interested, check with pharmacy about new 2 shot shingles series (shingrix).  Schedule follow up with eye doctor.   Health Maintenance After Age 72 After age 57, you are at a higher risk for certain long-term diseases and infections as well as injuries from falls. Falls are a major cause of broken bones and head injuries in people who are older than age 88. Getting regular preventive care can help to keep you healthy and well. Preventive care includes getting regular testing and making lifestyle changes as recommended by your health care provider. Talk with your health care provider about:  Which screenings and tests you should have. A screening is a test that checks for a disease when you have no symptoms.  A diet and exercise plan that is right for you. What should I know about screenings and tests to prevent falls? Screening and testing are the best ways to find a health problem early. Early diagnosis and treatment give you the best chance of managing medical conditions that are common after age 57. Certain conditions and lifestyle choices may make you more likely to have a fall. Your health care provider may recommend:  Regular vision checks. Poor vision and conditions such as cataracts can make you more likely to have a fall. If you wear glasses, make sure to get your prescription updated if your vision changes.  Medicine review. Work with your health care provider to regularly review all of the medicines you are taking, including over-the-counter medicines. Ask your health care provider about any side effects that may make you more likely to have a fall. Tell your health care provider if any medicines that you take make you feel dizzy or sleepy.  Osteoporosis screening. Osteoporosis is a condition that causes the bones to get weaker. This can make the bones weak and cause them to break more easily.  Blood pressure  screening. Blood pressure changes and medicines to control blood pressure can make you feel dizzy.  Strength and balance checks. Your health care provider may recommend certain tests to check your strength and balance while standing, walking, or changing positions.  Foot health exam. Foot pain and numbness, as well as not wearing proper footwear, can make you more likely to have a fall.  Depression screening. You may be more likely to have a fall if you have a fear of falling, feel emotionally low, or feel unable to do activities that you used to do.  Alcohol use screening. Using too much alcohol can affect your balance and may make you more likely to have a fall. What actions can I take to lower my risk of falls? General instructions  Talk with your health care provider about your risks for falling. Tell your health care provider if: ? You fall. Be sure to tell your health care provider about all falls, even ones that seem minor. ? You feel dizzy, sleepy, or off-balance.  Take over-the-counter and prescription medicines only as told by your health care provider. These include any supplements.  Eat a healthy diet and maintain a healthy weight. A healthy diet includes low-fat dairy products, low-fat (lean) meats, and fiber from whole grains, beans, and lots of fruits and vegetables. Home safety  Remove any tripping hazards, such as rugs, cords, and clutter.  Install safety equipment such as grab bars in bathrooms and safety rails on stairs.  Keep rooms and walkways well-lit. Activity  Follow a regular exercise program to stay fit. This will help you maintain your balance. Ask your health care provider what types of exercise are appropriate for you.  If you need a cane or walker, use it as recommended by your health care provider.  Wear supportive shoes that have nonskid soles. Lifestyle  Do not drink alcohol if your health care provider tells you not to drink.  If you drink  alcohol, limit how much you have: ? 0-1 drink a day for women. ? 0-2 drinks a day for men.  Be aware of how much alcohol is in your drink. In the [place] one drink equals one typical bottle of beer (12 oz), one-half glass of wine (5 oz), or one shot of hard liquor (1 oz).  Do not use any products that contain nicotine or tobacco, such as cigarettes and e-cigarettes. If you need help quitting, ask your health care provider. Summary  Having a healthy lifestyle and getting preventive care can help to protect your health and wellness after age 73.  Screening and testing are the best way to find a health problem early and help you avoid having a fall. Early diagnosis and treatment give you the best chance for managing medical conditions that are more common for people who are older than age 29.  Falls are a major cause of broken bones and head injuries in people who are older than age 48. Take precautions to prevent a fall at home.  Work with your health care provider to learn what changes you can make to improve your health and wellness and to prevent falls. This information is not intended to replace advice given to you by your health care provider. Make sure you discuss any questions you have with your health care provider. Document Released: 11/25/2016 Document Revised: 11/25/2016 Document Reviewed: 11/25/2016 Elsevier Interactive Patient Education  2019 [employer].

## 2018-07-16 ENCOUNTER — Encounter: Payer: Self-pay | Admitting: Family Medicine

## 2018-07-16 LAB — LIPID PANEL
Cholesterol: 247 mg/dL — ABNORMAL HIGH (ref <200)
HDL: 53 mg/dL (ref >=50)
LDL Cholesterol (Calc): 153 mg/dL (calc) — ABNORMAL HIGH
Non-HDL Cholesterol (Calc): 194 mg/dL (calc) — ABNORMAL HIGH (ref <130)
Total CHOL/HDL Ratio: 4.7 (calc) (ref <5.0)
Triglycerides: 239 mg/dL — ABNORMAL HIGH (ref <150)

## 2018-07-16 LAB — VITAMIN B12: Vitamin B-12: 491 pg/mL (ref 200–1100)

## 2018-07-16 LAB — VITAMIN D 25 HYDROXY (VIT D DEFICIENCY, FRACTURES): Vit D, 25-Hydroxy: 52 ng/mL (ref 30–100)

## 2018-07-16 NOTE — Assessment & Plan Note (Addendum)
Reviewed calcium/vit D dosing and regular weight bearing exercise.  She is taking both daily and weekly vit D.  Update DEXA.

## 2018-07-16 NOTE — Assessment & Plan Note (Signed)
Update FLP. The 10-year ASCVD risk score Mikey Bussing DC Brooke Bonito., et al., 2013) is: 9%   Values used to calculate the score:     Age: 73 years     Sex: Female     Is Non-Hispanic African American: No     Diabetic: No     Tobacco smoker: No     Systolic Blood Pressure: 371 mmHg     Is BP treated: No     HDL Cholesterol: 53 mg/dL     Total Cholesterol: 247 mg/dL

## 2018-07-16 NOTE — Assessment & Plan Note (Signed)
Appreciate onc care.  

## 2018-07-16 NOTE — Assessment & Plan Note (Addendum)
Continues oral replacement - update labs.

## 2018-07-16 NOTE — Assessment & Plan Note (Signed)

## 2018-07-16 NOTE — Assessment & Plan Note (Signed)
Continues oral replacement. Update labs.

## 2018-07-16 NOTE — Assessment & Plan Note (Signed)
Advanced directive received and scanned 07/2016 - HCPOA is husband Lori Shaw then [employer]. Does not want prolonged life support if incurable condition.

## 2018-07-16 NOTE — Assessment & Plan Note (Signed)
Preventative protocols reviewed and updated unless pt declined. Discussed healthy diet and lifestyle.  

## 2018-07-16 NOTE — Assessment & Plan Note (Signed)
Pt motivated for ongoing weight loss - followed by healthy weight and wellness clinic.

## 2018-07-18 NOTE — Addendum Note (Signed)
Addended by: Ria Bush on: 07/18/2018 01:28 PM   Modules accepted: Orders

## 2018-07-26 ENCOUNTER — Telehealth (INDEPENDENT_AMBULATORY_CARE_PROVIDER_SITE_OTHER): Payer: Medicare HMO | Admitting: Physician Assistant

## 2018-07-26 ENCOUNTER — Other Ambulatory Visit: Payer: Self-pay

## 2018-07-26 DIAGNOSIS — Z6831 Body mass index (BMI) 31.0-31.9, adult: Secondary | ICD-10-CM | POA: Diagnosis not present

## 2018-07-26 DIAGNOSIS — G4733 Obstructive sleep apnea (adult) (pediatric): Secondary | ICD-10-CM | POA: Diagnosis not present

## 2018-07-26 DIAGNOSIS — E559 Vitamin D deficiency, unspecified: Secondary | ICD-10-CM | POA: Diagnosis not present

## 2018-07-26 DIAGNOSIS — E669 Obesity, unspecified: Secondary | ICD-10-CM

## 2018-07-27 ENCOUNTER — Encounter: Payer: Self-pay | Admitting: Family Medicine

## 2018-07-27 NOTE — Progress Notes (Signed)
Office: [phone]  /  Fax: [phone] TeleHealth Visit:  Lori Shaw has verbally consented to this TeleHealth visit today. The patient is located at home, the provider is located at the [facility] office. The participants in this visit include the listed provider and patient. The visit was conducted today via Webex.  HPI:   Chief Complaint: OBESITY Lori Shaw is here to discuss her progress with her obesity treatment plan. She is on the Category 2 plan and is following her eating plan approximately 65-70% of the time. She states she is walking 8 blocks daily.  Lori Shaw reports that her most recent weight was 187 lbs. She indulged in Yum Yum ice cream and hot dogs as well as pizza over the last 2 weeks. She has started to keep track of her protein and calories. We were unable to weigh the patient today for this TeleHealth visit. She feels as if she has maintained her weight since her last visit. She has lost 15 lbs since starting treatment with [name].  Vitamin D deficiency Lori Shaw has a diagnosis of Vitamin D deficiency. She is currently taking OTC Vit D daily and denies nausea, vomiting or muscle weakness.  ASSESSMENT AND PLAN:  Vitamin D deficiency  Class 1 obesity with serious comorbidity and body mass index (BMI) of 31.0 to 31.9 in adult, unspecified obesity type  PLAN:  Vitamin D Deficiency Lori Shaw was informed that low Vitamin D levels contributes to fatigue and are associated with obesity, breast, and colon cancer. She will continue taking OTC Vit D and will change to taking every other week. She will follow-up for routine testing of Vitamin D, at least 2-3 times per year. She was informed of the risk of over-replacement of Vitamin D and agrees to not increase her dose unless she discusses this with [name] first. Lori Shaw agrees to follow-up with our clinic in 2 weeks.  Obesity Lori Shaw is currently in the action stage of change. As such, her goal is to  continue with weight loss efforts. She has agreed to follow the Category 2 plan. Lori Shaw has been instructed to work up to a goal of 150 minutes of combined cardio and strengthening exercise per week for weight loss and overall health benefits. We discussed the following Behavioral Modification Strategies today: decrease eating out, work on meal planning and easy cooking plans.  Lori Shaw has agreed to follow-up with our clinic in 2 weeks. She was informed of the importance of frequent follow-up visits to maximize her success with intensive lifestyle modifications for her multiple health conditions.  ALLERGIES: Allergies  Allergen Reactions   Sulfa Antibiotics Nausea Only    MEDICATIONS: Current Outpatient Medications on File Prior to Visit  Medication Sig Dispense Refill   Cholecalciferol (VITAMIN D) 2000 units CAPS Take 1 capsule by mouth daily.     Cyanocobalamin (VITAMIN B-12) 2500 MCG SUBL Place 1 tablet (2,500 mcg total) under the tongue daily.  0   fluticasone (FLONASE) 50 MCG/ACT nasal spray Place 2 sprays into both nostrils daily as needed. 16 g 11   Vitamin D, Ergocalciferol, (DRISDOL) 1.25 MG (50000 UT) CAPS capsule Take 1 capsule (50,000 Units total) by mouth once a week. 4 capsule 0   No current facility-administered medications on file prior to visit.     PAST MEDICAL HISTORY: Past Medical History:  Diagnosis Date   B12 deficiency    Back pain    CLL (chronic lymphocytic leukemia) ([place]) 06/21/2016   Rai stage 0, dx 06/2016 (Dr  Janese Banks)   HLD (hyperlipidemia)    Joint pain    Knee problem    OSA on CPAP 2006, 04/2014   AHI of 23.8 and an RDI of 42.9, on CPAP 9 cm H2O (Dohmeier)   Osteopenia 08/2010   T -1.4 at L femur neck   Piriformis syndrome 12/14/2014   Vitamin D deficiency     PAST SURGICAL HISTORY: Past Surgical History:  Procedure Laterality Date   ABDOMINAL HYSTERECTOMY  01/27/1983   for menometrorrhagia, one ovary remained   COLONOSCOPY   2012   no records available. per patient normal.    dexa  08/2010   osteopenia   TONSILLECTOMY  01/26/1973    SOCIAL HISTORY: Social History   Tobacco Use   Smoking status: Former Smoker    Packs/day: 2.00    Years: 20.00    Pack years: 40.00    Types: Cigarettes    Quit date: 01/26/1978    Years since quitting: 40.5   Smokeless tobacco: Never Used  Substance Use Topics   Alcohol use: No   Drug use: Yes    Comment: occassionally glass of wine    FAMILY HISTORY: Family History  Problem Relation Age of Onset   Hyperlipidemia Mother    CAD Mother        CABG   Hypertension Mother    Hyperlipidemia Father    CAD Father        MI   Sudden death Father    Arthritis Maternal Grandmother    Cancer Sister        breast   Breast cancer Sister 71   Alzheimer's disease Other    Heart attack Other    Diabetes Neg Hx    Stroke Neg Hx    ROS: Review of Systems  Gastrointestinal: Negative for nausea and vomiting.  Musculoskeletal:       Negative for muscle weakness.   PHYSICAL EXAM: Pt in no acute distress  RECENT LABS AND TESTS: BMET    Component Value Date/Time   NA 140 07/08/2018 1445   NA 142 03/17/2018 1041   K 4.5 07/08/2018 1445   CL 106 07/08/2018 1445   CO2 25 07/08/2018 1445   GLUCOSE 94 07/08/2018 1445   BUN 20 07/08/2018 1445   BUN 16 03/17/2018 1041   CREATININE 0.78 07/08/2018 1445   CALCIUM 9.2 07/08/2018 1445   GFRNONAA >60 07/08/2018 1445   GFRAA >60 07/08/2018 1445   Lab Results  Component Value Date   HGBA1C 5.4 03/17/2018   HGBA1C 5.5 11/25/2017   Lab Results  Component Value Date   INSULIN 20.9 03/17/2018   INSULIN 6.6 11/25/2017   CBC    Component Value Date/Time   WBC 20.7 (H) 07/08/2018 1445   RBC 4.95 07/08/2018 1445   HGB 15.4 (H) 07/08/2018 1445   HGB 15.1 11/25/2017 1208   HGB 14.5 04/10/2010   HCT 46.1 (H) 07/08/2018 1445   HCT 44.8 11/25/2017 1208   PLT 175 07/08/2018 1445   MCV 93.1 07/08/2018  1445   MCV 92 11/25/2017 1208   MCH 31.1 07/08/2018 1445   MCHC 33.4 07/08/2018 1445   RDW 14.0 07/08/2018 1445   RDW 13.5 11/25/2017 1208   LYMPHSABS 12.8 (H) 07/08/2018 1445   LYMPHSABS 8.7 (H) 11/25/2017 1208   MONOABS 2.4 (H) 07/08/2018 1445   EOSABS 0.3 07/08/2018 1445   EOSABS 0.3 11/25/2017 1208   BASOSABS 0.1 07/08/2018 1445   BASOSABS 0.1 11/25/2017 1208   Iron/TIBC/Ferritin/ %Sat  No results found for: IRON, TIBC, FERRITIN, IRONPCTSAT Lipid Panel     Component Value Date/Time   CHOL 247 (H) 07/15/2018 1553   CHOL 215 (H) 03/17/2018 1041   TRIG 239 (H) 07/15/2018 1553   TRIG 152 04/10/2010   HDL 53 07/15/2018 1553   HDL 45 03/17/2018 1041   CHOLHDL 4.7 07/15/2018 1553   VLDL 46.0 (H) 07/13/2017 1400   LDLCALC 153 (H) 07/15/2018 1553   LDLCALC 67 04/10/2010   LDLDIRECT 141.0 07/13/2017 1400   Hepatic Function Panel     Component Value Date/Time   PROT 7.0 07/08/2018 1445   PROT 6.5 03/17/2018 1041   ALBUMIN 4.5 07/08/2018 1445   ALBUMIN 4.6 03/17/2018 1041   AST 17 07/08/2018 1445   AST 18 04/10/2010   ALT 17 07/08/2018 1445   ALKPHOS 63 07/08/2018 1445   ALKPHOS 79 04/10/2010   BILITOT 0.7 07/08/2018 1445   BILITOT 0.5 03/17/2018 1041      Component Value Date/Time   TSH 2.510 11/25/2017 1208   TSH 3.46 06/19/2016 0808   TSH 1.28 09/24/2015 0831   Results for TABATHIA, KNOCHE (MRN [id number]) as of 07/27/2018 11:19  Ref. Range 07/15/2018 15:53  Vitamin D, 25-Hydroxy Latest Ref Range: 30 - 100 ng/mL 52   I, Michaelene Song, am acting as Location manager for [employer], PA-C I, Abby Potash, PA-C have reviewed above note and agree with its content

## 2018-07-28 ENCOUNTER — Inpatient Hospital Stay
Admission: EM | Admit: 2018-07-28 | Discharge: 2018-08-02 | DRG: 418 | Disposition: A | Discharge status: Home / Self Care | Payer: Medicare HMO | Attending: Surgery | Admitting: Surgery

## 2018-07-28 ENCOUNTER — Other Ambulatory Visit: Payer: Self-pay

## 2018-07-28 ENCOUNTER — Telehealth: Payer: Self-pay

## 2018-07-28 ENCOUNTER — Ambulatory Visit
Admission: RE | Admit: 2018-07-28 | Discharge: 2018-07-28 | Disposition: A | Discharge status: Home / Self Care | Payer: Medicare HMO | Source: Ambulatory Visit | Attending: Family Medicine | Admitting: Family Medicine

## 2018-07-28 ENCOUNTER — Encounter: Payer: Self-pay | Admitting: Family Medicine

## 2018-07-28 ENCOUNTER — Telehealth (INDEPENDENT_AMBULATORY_CARE_PROVIDER_SITE_OTHER): Payer: Medicare HMO | Admitting: Family Medicine

## 2018-07-28 ENCOUNTER — Encounter: Payer: Self-pay | Admitting: Emergency Medicine

## 2018-07-28 ENCOUNTER — Telehealth: Payer: Self-pay | Admitting: Family Medicine

## 2018-07-28 ENCOUNTER — Other Ambulatory Visit (INDEPENDENT_AMBULATORY_CARE_PROVIDER_SITE_OTHER): Payer: Medicare HMO

## 2018-07-28 VITALS — BP 127/76 | HR 68 | Temp 98.2°F | Ht 64.25 in | Wt 187.0 lb

## 2018-07-28 DIAGNOSIS — R7989 Other specified abnormal findings of blood chemistry: Secondary | ICD-10-CM | POA: Diagnosis not present

## 2018-07-28 DIAGNOSIS — K8020 Calculus of gallbladder without cholecystitis without obstruction: Secondary | ICD-10-CM | POA: Diagnosis present

## 2018-07-28 DIAGNOSIS — K851 Biliary acute pancreatitis without necrosis or infection: Principal | ICD-10-CM | POA: Diagnosis present

## 2018-07-28 DIAGNOSIS — Z90710 Acquired absence of both cervix and uterus: Secondary | ICD-10-CM | POA: Diagnosis not present

## 2018-07-28 DIAGNOSIS — J309 Allergic rhinitis, unspecified: Secondary | ICD-10-CM | POA: Diagnosis present

## 2018-07-28 DIAGNOSIS — E538 Deficiency of other specified B group vitamins: Secondary | ICD-10-CM | POA: Diagnosis present

## 2018-07-28 DIAGNOSIS — K760 Fatty (change of) liver, not elsewhere classified: Secondary | ICD-10-CM | POA: Diagnosis not present

## 2018-07-28 DIAGNOSIS — C9110 Chronic lymphocytic leukemia of B-cell type not having achieved remission: Secondary | ICD-10-CM | POA: Diagnosis not present

## 2018-07-28 DIAGNOSIS — K8010 Calculus of gallbladder with chronic cholecystitis without obstruction: Secondary | ICD-10-CM | POA: Diagnosis not present

## 2018-07-28 DIAGNOSIS — G4733 Obstructive sleep apnea (adult) (pediatric): Secondary | ICD-10-CM | POA: Diagnosis not present

## 2018-07-28 DIAGNOSIS — R1010 Upper abdominal pain, unspecified: Secondary | ICD-10-CM

## 2018-07-28 DIAGNOSIS — R1013 Epigastric pain: Secondary | ICD-10-CM | POA: Diagnosis not present

## 2018-07-28 DIAGNOSIS — Z79899 Other long term (current) drug therapy: Secondary | ICD-10-CM

## 2018-07-28 DIAGNOSIS — Z1159 Encounter for screening for other viral diseases: Secondary | ICD-10-CM | POA: Diagnosis not present

## 2018-07-28 DIAGNOSIS — E785 Hyperlipidemia, unspecified: Secondary | ICD-10-CM | POA: Diagnosis not present

## 2018-07-28 DIAGNOSIS — Z87891 Personal history of nicotine dependence: Secondary | ICD-10-CM | POA: Diagnosis not present

## 2018-07-28 DIAGNOSIS — Z882 Allergy status to sulfonamides status: Secondary | ICD-10-CM

## 2018-07-28 DIAGNOSIS — E559 Vitamin D deficiency, unspecified: Secondary | ICD-10-CM | POA: Diagnosis present

## 2018-07-28 DIAGNOSIS — Z20828 Contact with and (suspected) exposure to other viral communicable diseases: Secondary | ICD-10-CM | POA: Diagnosis not present

## 2018-07-28 DIAGNOSIS — K859 Acute pancreatitis without necrosis or infection, unspecified: Secondary | ICD-10-CM | POA: Diagnosis not present

## 2018-07-28 DIAGNOSIS — M8580 Other specified disorders of bone density and structure, unspecified site: Secondary | ICD-10-CM | POA: Diagnosis present

## 2018-07-28 LAB — CBC WITH DIFFERENTIAL/PLATELET
Basophils Absolute: 0.1 10*3/uL (ref 0.0–0.1)
Basophils Relative: 0.4 % (ref 0.0–3.0)
Eosinophils Absolute: 0.2 10*3/uL (ref 0.0–0.7)
Eosinophils Relative: 1 % (ref 0.0–5.0)
HCT: 44.7 % (ref 36.0–46.0)
Hemoglobin: 14.7 g/dL (ref 12.0–15.0)
Lymphocytes Relative: 57.6 % — ABNORMAL HIGH (ref 12.0–46.0)
Lymphs Abs: 10.8 10*3/uL — ABNORMAL HIGH (ref 0.7–4.0)
MCHC: 32.8 g/dL (ref 30.0–36.0)
MCV: 94 fl (ref 78.0–100.0)
Monocytes Absolute: 0.9 10*3/uL (ref 0.1–1.0)
Monocytes Relative: 4.9 % (ref 3.0–12.0)
Neutro Abs: 6.8 10*3/uL (ref 1.4–7.7)
Neutrophils Relative %: 36.1 % — ABNORMAL LOW (ref 43.0–77.0)
Platelets: 188 10*3/uL (ref 150.0–400.0)
RBC: 4.75 Mil/uL (ref 3.87–5.11)
RDW: 14.4 % (ref 11.5–15.5)
WBC: 18.7 10*3/uL (ref 4.0–10.5)

## 2018-07-28 LAB — COMPREHENSIVE METABOLIC PANEL
ALT: 319 U/L — ABNORMAL HIGH (ref 0–35)
AST: 219 U/L — ABNORMAL HIGH (ref 0–37)
Albumin: 4.2 g/dL (ref 3.5–5.2)
Alkaline Phosphatase: 150 U/L — ABNORMAL HIGH (ref 39–117)
BUN: 11 mg/dL (ref 6–23)
CO2: 26 mEq/L (ref 19–32)
Calcium: 9.1 mg/dL (ref 8.4–10.5)
Chloride: 104 mEq/L (ref 96–112)
Creatinine, Ser: 0.78 mg/dL (ref 0.40–1.20)
GFR: 72.61 mL/min (ref >60.00)
Glucose, Bld: 89 mg/dL (ref 70–99)
Potassium: 4.2 mEq/L (ref 3.5–5.1)
Sodium: 141 mEq/L (ref 135–145)
Total Bilirubin: 1 mg/dL (ref 0.2–1.2)
Total Protein: 6.5 g/dL (ref 6.0–8.3)

## 2018-07-28 LAB — SARS CORONAVIRUS 2 BY RT PCR (HOSPITAL ORDER, PERFORMED IN ~~LOC~~ HOSPITAL LAB): SARS Coronavirus 2: NEGATIVE

## 2018-07-28 LAB — LIPASE: Lipase: 2227 U/L — ABNORMAL HIGH (ref 11.0–59.0)

## 2018-07-28 MED ORDER — ONDANSETRON HCL 4 MG/2ML IJ SOLN: 4.0000 mg | Freq: Four times a day (QID) | INTRAMUSCULAR | Status: DC | PRN

## 2018-07-28 MED ORDER — ONDANSETRON HCL 4 MG PO TABS: 4.0000 mg | ORAL_TABLET | Freq: Four times a day (QID) | ORAL | Status: DC | PRN

## 2018-07-28 MED ORDER — SODIUM CHLORIDE 0.9 % IV SOLN
INTRAVENOUS | Status: AC
  Administered 2018-07-28 – 2018-07-31 (×8): via INTRAVENOUS

## 2018-07-28 MED ORDER — MORPHINE SULFATE (PF) 4 MG/ML IV SOLN
4.0000 mg | Freq: Once | INTRAVENOUS | Status: AC
  Administered 2018-07-28: 4 mg via INTRAVENOUS
  Filled 2018-07-28: qty 1

## 2018-07-28 MED ORDER — TRAMADOL HCL 50 MG PO TABS
50.0000 mg | ORAL_TABLET | Freq: Four times a day (QID) | ORAL | Status: DC | PRN
  Administered 2018-07-28 – 2018-08-01 (×6): 50 mg via ORAL
  Filled 2018-07-28 (×6): qty 1

## 2018-07-28 MED ORDER — SODIUM CHLORIDE 0.9 % IV SOLN
1000.0000 mL | Freq: Once | INTRAVENOUS | Status: AC
  Administered 2018-07-28: 1000 mL via INTRAVENOUS

## 2018-07-28 MED ORDER — FLUTICASONE PROPIONATE 50 MCG/ACT NA SUSP
2.0000 | Freq: Every day | NASAL | Status: DC | PRN
  Filled 2018-07-28: qty 16

## 2018-07-28 MED ORDER — SODIUM CHLORIDE 0.9% FLUSH: 3.0000 mL | Freq: Once | INTRAVENOUS | Status: DC

## 2018-07-28 MED ORDER — ONDANSETRON HCL 4 MG/2ML IJ SOLN
4.0000 mg | Freq: Once | INTRAMUSCULAR | Status: AC
  Administered 2018-07-28: 4 mg via INTRAVENOUS
  Filled 2018-07-28: qty 2

## 2018-07-28 MED ORDER — ALBUTEROL SULFATE (2.5 MG/3ML) 0.083% IN NEBU: 2.5000 mg | INHALATION_SOLUTION | RESPIRATORY_TRACT | Status: DC | PRN

## 2018-07-28 NOTE — Assessment & Plan Note (Signed)
Chronic leukocytosis from this. Will keep this in mind with interpretation of CBC.

## 2018-07-28 NOTE — ED Triage Notes (Signed)
Pt presents to ED via POV with c/o RUQ abdominal pain, states had [name] earlier today and blood work, was called by PCP and told she had gall stones, was told to come to ED for further evaluation of the gall stones. Pt c/o N/V on Tuesday.

## 2018-07-28 NOTE — ED Notes (Signed)
ED TO INPATIENT HANDOFF REPORT  ED Nurse Name and Phone #:  Charge RN   S Name/Age/Gender Lori Shaw 73 y.o. female Room/Bed: ED33A/ED33A  Code Status   Code Status: Not on file  Home/SNF/Other Home Patient oriented to: self, place, time and situation Is this baseline? Yes   Triage Complete: Triage complete  Chief Complaint Referred by Dr/poss gallstones  Triage Note Pt presents to ED via POV with c/o RUQ abdominal pain, states had [name] earlier today and blood work, was called by PCP and told she had gall stones, was told to come to ED for further evaluation of the gall stones. Pt c/o N/V on Tuesday.    Allergies Allergies  Allergen Reactions  . Sulfa Antibiotics Nausea Only    Level of Care/Admitting Diagnosis ED Disposition    ED Disposition Condition Comment   Admit  The patient appears reasonably stabilized for admission considering the current resources, flow, and capabilities available in the ED at this time, and I doubt any other [facility] requiring further screening and/or treatment in the ED prior to admission is  present.       B Medical/Surgery History Past Medical History:  Diagnosis Date  . B12 deficiency   . Back pain   . CLL (chronic lymphocytic leukemia) ([place]) 06/21/2016   Rai stage 0, dx 06/2016 (Dr Janese Banks)  . HLD (hyperlipidemia)   . Joint pain   . Knee problem   . OSA on CPAP 2006, 04/2014   AHI of 23.8 and an RDI of 42.9, on CPAP 9 cm H2O (Dohmeier)  . Osteopenia 08/2010   T -1.4 at L femur neck  . Piriformis syndrome 12/14/2014  . Vitamin D deficiency    Past Surgical History:  Procedure Laterality Date  . ABDOMINAL HYSTERECTOMY  01/27/1983   for menometrorrhagia, one ovary remained  . COLONOSCOPY  2012   no records available. per patient normal.   . dexa  08/2010   osteopenia  . TONSILLECTOMY  01/26/1973     A IV Location/Drains/Wounds Patient Lines/Drains/Airways Status   Active Line/Drains/Airways    Name:   Placement date:    Placement time:   Site:   Days:   Peripheral IV 07/28/18 Right Hand   07/28/18    1825    Hand   less than 1          Intake/Output Last 24 hours No intake or output data in the 24 hours ending 07/28/18 1855  Labs/Imaging Results for orders placed or performed in visit on 07/28/18 (from the past 48 hour(s))  Comprehensive metabolic panel     Status: Abnormal   Collection Time: 07/28/18  1:23 PM  Result Value Ref Range   Sodium 141 135 - 145 mEq/L   Potassium 4.2 3.5 - 5.1 mEq/L   Chloride 104 96 - 112 mEq/L   CO2 26 19 - 32 mEq/L   Glucose, Bld 89 70 - 99 mg/dL   BUN 11 6 - 23 mg/dL   Creatinine, Ser 0.78 0.40 - 1.20 mg/dL   Total Bilirubin 1.0 0.2 - 1.2 mg/dL   Alkaline Phosphatase 150 (H) 39 - 117 U/L   AST 219 (H) 0 - 37 U/L   ALT 319 (H) 0 - 35 U/L   Total Protein 6.5 6.0 - 8.3 g/dL   Albumin 4.2 3.5 - 5.2 g/dL   Calcium 9.1 8.4 - 10.5 mg/dL   GFR 72.61 >60.00 mL/min  CBC with Differential/Platelet     Status: Abnormal  Collection Time: 07/28/18  1:23 PM  Result Value Ref Range   WBC 18.7 Repeated and verified X2. (HH) 4.0 - 10.5 K/uL   RBC 4.75 3.87 - 5.11 Mil/uL   Hemoglobin 14.7 12.0 - 15.0 g/dL   HCT 44.7 36.0 - 46.0 %   MCV 94.0 78.0 - 100.0 fl   MCHC 32.8 30.0 - 36.0 g/dL   RDW 14.4 11.5 - 15.5 %   Platelets 188.0 150.0 - 400.0 K/uL   Neutrophils Relative % 36.1 (L) 43.0 - 77.0 %   Lymphocytes Relative 57.6 (H) 12.0 - 46.0 %   Monocytes Relative 4.9 3.0 - 12.0 %   Eosinophils Relative 1.0 0.0 - 5.0 %   Basophils Relative 0.4 0.0 - 3.0 %   Neutro Abs 6.8 1.4 - 7.7 K/uL   Lymphs Abs 10.8 (H) 0.7 - 4.0 K/uL   Monocytes Absolute 0.9 0.1 - 1.0 K/uL   Eosinophils Absolute 0.2 0.0 - 0.7 K/uL   Basophils Absolute 0.1 0.0 - 0.1 K/uL  Lipase     Status: Abnormal   Collection Time: 07/28/18  1:23 PM  Result Value Ref Range   Lipase 2,227.0 (H) 11.0 - 59.0 U/L   US Abdomen Complete  Result Date: 07/28/2018 CLINICAL DATA:  Epigastric and right upper quadrant  abdominal pain. EXAM: ABDOMEN ULTRASOUND COMPLETE COMPARISON:  None. FINDINGS: Gallbladder: There is gallbladder wall thickening with the gallbladder measuring approximately 6 mm in thickness. There is gallbladder sludge with multiple gallstones. The sonographic Percell Miller sign is reported as negative. There may be some trace pericholecystic free fluid. Common bile duct: Diameter: 0.4 cm Liver: Diffuse increased echogenicity with slightly heterogeneous liver. Appearance typically secondary to fatty infiltration. Fibrosis secondary consideration. No secondary findings of cirrhosis noted. No focal hepatic lesion or intrahepatic biliary duct dilatation. There is a 1.5 cm cyst in the right hepatic lobe. Portal vein is patent on color Doppler imaging with normal direction of blood flow towards the liver. IVC: No abnormality visualized. Pancreas: Not well evaluated secondary to overlying bowel gas. Spleen: Size and appearance within normal limits. Right Kidney: Length: 10 cm. Echogenicity within normal limits. No mass or hydronephrosis visualized. Left Kidney: Length: 10 cm. Echogenicity within normal limits. No mass or hydronephrosis visualized. Abdominal aorta: No aneurysm visualized. Other findings: None. IMPRESSION: 1. Cholelithiasis with gallbladder wall thickening and pericholecystic free fluid in the absence of a positive sonographic Percell Miller sign is equivocal for acute cholecystitis. Correlation with laboratory studies and physical exam is recommended. 2. Diffuse increased echogenicity with slightly heterogeneous liver. Appearance typically secondary to fatty infiltration. 3. Pancreas poorly evaluated secondary to overlying bowel gas. These results will be called to the ordering clinician or representative by the Radiologist Assistant, and communication documented in the PACS or zVision Dashboard. Electronically Signed   By: Constance Holster M.D.   On: 07/28/2018 14:42    Pending Labs Unresulted Labs (From  admission, onward)    Start     Ordered   07/28/18 1833  SARS Coronavirus 2 (CEPHEID - Performed in [place] hospital lab), [place]  (Asymptomatic Patients Labs)  Once,   STAT    Question:  Rule Out  Answer:  Yes   07/28/18 1832   07/28/18 1538  Urinalysis, Complete w Microscopic  ONCE - STAT,   STAT     07/28/18 1537          Vitals/Pain Today's Vitals   07/28/18 1536 07/28/18 1745 07/28/18 1854  BP: 129/84    Pulse: 67  Resp: 18    Temp: 98.7 F (37.1 C)    TempSrc: Oral    SpO2: 99%    Weight: 84.8 kg    Height: 5\' 4"  (1.626 m)    PainSc: 4  7  2      Isolation Precautions No active isolations  Medications Medications  sodium chloride flush (NS) 0.9 % injection 3 mL (has no administration in time range)  morphine 4 MG/ML injection 4 mg (4 mg Intravenous Given 07/28/18 1826)  ondansetron (ZOFRAN) injection 4 mg (4 mg Intravenous Given 07/28/18 1826)  0.9 %  sodium chloride infusion (1,000 mLs Intravenous New Bag/Given 07/28/18 1826)    Mobility walks Low fall risk   Focused Assessments    R Recommendations: See Admitting Provider Note  Report given to:   Additional Notes:

## 2018-07-28 NOTE — ED Notes (Signed)
Pt had labs drawn at PCP office, per Dr. Corky Downs, no labs at this time, waiting for labs drawn at 1330 at PCP office.

## 2018-07-28 NOTE — Progress Notes (Signed)
Virtual visit completed through [facility]. Due to national recommendations of social distancing due to COVID-19, a virtual visit is felt to be most appropriate for this patient at this time. Reviewed limitations of a virtual visit.   Patient location: home Provider location: [place] [facility], office If any vitals were documented, they were collected by patient at home unless specified below.    BP 127/76   Pulse 68   Temp 98.2 F (36.8 C) (Temporal)   Ht 5' 4.25" (1.632 m)   Wt 187 lb (84.8 kg)   BMI 31.85 kg/m    CC: abd pain Subjective:    Patient ID: Lori Shaw, female    DOB: 1945/02/08, 73 y.o.   MRN: [id number]  HPI: Lori Shaw is a 73 y.o. female presenting on 07/28/2018 for Abdominal Pain (C/o mid abd and back pain. Started 07/26/18. Also, c/o nausea, vomited last night and diarrhea.  Diarrhea on 07/26/18.  Says heating pad helps. )   2 nights ago started having epigastric abdominal pain with radiation to back, described as grabbing pain into R shoulderblade. Severe pain, associated with nausea and some vomiting x4 that evening. Heating pad did seem to help. Felt better the next day but again last night symptoms recurred, but not as bad as Tuesday night. + chills. Had loose stools 2d ago before feeling ill. Tried some tums due to heartburn, as well as aspirin 81mg . Also tried hot peppermint tea.   Dinner last 2 nights consisted of shrimp, chicken breast, salmon. Not too greasy.   No fevers, lower abdominal pain, urinary symptoms (dysuria, urgency, frequency). No dysphagia.       Relevant past medical, surgical, family and social history reviewed and updated as indicated. Interim medical history since our last visit reviewed. Allergies and medications reviewed and updated. Outpatient Medications Prior to Visit  Medication Sig Dispense Refill  . fluticasone (FLONASE) 50 MCG/ACT nasal spray Place 2 sprays into both nostrils daily as needed. 16 g  11  . Cholecalciferol (VITAMIN D) 2000 units CAPS Take 1 capsule by mouth daily.    . Cyanocobalamin (VITAMIN B-12) 2500 MCG SUBL Place 1 tablet (2,500 mcg total) under the tongue daily. (Patient not taking: Reported on 07/28/2018)  0  . Vitamin D, Ergocalciferol, (DRISDOL) 1.25 MG (50000 UT) CAPS capsule Take 1 capsule (50,000 Units total) by mouth once a week. (Patient not taking: Reported on 07/28/2018) 4 capsule 0   No facility-administered medications prior to visit.      Per HPI unless specifically indicated in ROS section below Review of Systems Objective:    BP 127/76   Pulse 68   Temp 98.2 F (36.8 C) (Temporal)   Ht 5' 4.25" (1.632 m)   Wt 187 lb (84.8 kg)   BMI 31.85 kg/m   Wt Readings from Last 3 Encounters:  07/28/18 187 lb (84.8 kg)  07/15/18 187 lb 6 oz (85 kg)  03/31/18 188 lb (85.3 kg)     Physical exam: Gen: alert, NAD, not ill appearing Pulm: speaks in complete sentences without increased work of breathing Psych: normal mood, normal thought content  Abd: tender when she pushes on her epigastric abdomen     Results for orders placed or performed in visit on 07/15/18  VITAMIN D 25 Hydroxy (Vit-D Deficiency, Fractures)  Result Value Ref Range   Vit D, 25-Hydroxy 52 30 - 100 ng/mL  Vitamin B12  Result Value Ref Range   Vitamin B-12 491 200 - 1,100 pg/mL  Lipid panel  Result Value Ref Range   Cholesterol 247 (H) <200 mg/dL   HDL 53 > OR = 50 mg/dL   Triglycerides 239 (H) <150 mg/dL   LDL Cholesterol (Calc) 153 (H) mg/dL (calc)   Total CHOL/HDL Ratio 4.7 <5.0 (calc)   Non-HDL Cholesterol (Calc) 194 (H) <130 mg/dL (calc)   Assessment & Plan:   Problem List Items Addressed This Visit    Upper abdominal pain - Primary    Epigastric and RUQ abd pain with radiation to R scapula. High suspicion for gallstone disease, r/o PUD, gastritis, pancreatitis etc - check CBC, CMP, lipase and abd [name] today. Consider adding PPI. Pt agrees with plan.       Relevant Orders    US Abdomen Complete   Comprehensive metabolic panel   CBC with Differential/Platelet   Lipase   CLL (chronic lymphocytic leukemia) (HCC)    Chronic leukocytosis from this. Will keep this in mind with interpretation of CBC.           No orders of the defined types were placed in this encounter.  Orders Placed This Encounter  Procedures  . US Abdomen Complete    SLOT OK PER ALISA    Standing Status:   Future    Standing Expiration Date:   09/28/2019    Order Specific Question:   Reason for Exam (SYMPTOM  OR DIAGNOSIS REQUIRED)    Answer:   epigastric and RUQ pain with radiation into shoulderblade    Order Specific Question:   Preferred imaging location?    Answer:   [facility]    Order Specific Question:   Call Results- Best Contact Number?    Answer:   [phone]  . Comprehensive metabolic panel  . CBC with Differential/Platelet  . Lipase    I discussed the assessment and treatment plan with the patient. The patient was provided an opportunity to ask questions and all were answered. The patient agreed with the plan and demonstrated an understanding of the instructions. The patient was advised to call back or seek an in-person evaluation if the symptoms worsen or if the condition fails to improve as anticipated.  Follow up plan: No follow-ups on file.  Ria Bush, MD

## 2018-07-28 NOTE — Telephone Encounter (Signed)
Noted. Thanks.    Lori Shaw---->Please call the ER to make sure they have seen this result, along with her other results.   She will need ER eval/tx given the u/s and her other labs.   Routed to PCP as FYI.  Thanks.

## 2018-07-28 NOTE — H&P (Signed)
Lori Shaw at [facility] NAME: [place]    MR#:  [id number]  DATE OF BIRTH:  1945-09-13  DATE OF ADMISSION:  07/28/2018  PRIMARY CARE PHYSICIAN: Ria Bush, MD   REQUESTING/REFERRING PHYSICIAN: Lavonia Drafts  CHIEF COMPLAINT:   Abdominal pain with nausea and vomiting HISTORY OF PRESENT ILLNESS:  Lori Shaw  is a 73 y.o. female with a known history of hyperlipidemia, obstructive sleep apnea, CLL, B12 deficiency and other medical problems is presenting to the ED with a chief complaint of epigastric abdominal pain associated with severe nausea and vomiting for the past 2 days.  Went to her primary care physician who has ordered ultrasound demonstrated possible cholecystitis and multiple gallstones and patient is sent over to the hospital emergency department.  Patient is given pain medication and nausea medicine and resting comfortably during my examination denies any alcohol intake  PAST MEDICAL HISTORY:   Past Medical History:  Diagnosis Date  . B12 deficiency   . Back pain   . CLL (chronic lymphocytic leukemia) ([place]) 06/21/2016   Rai stage 0, dx 06/2016 (Dr Janese Banks)  . HLD (hyperlipidemia)   . Joint pain   . Knee problem   . OSA on CPAP 2006, 04/2014   AHI of 23.8 and an RDI of 42.9, on CPAP 9 cm H2O (Dohmeier)  . Osteopenia 08/2010   T -1.4 at L femur neck  . Piriformis syndrome 12/14/2014  . Vitamin D deficiency     PAST SURGICAL HISTOIRY:   Past Surgical History:  Procedure Laterality Date  . ABDOMINAL HYSTERECTOMY  01/27/1983   for menometrorrhagia, one ovary remained  . COLONOSCOPY  2012   no records available. per patient normal.   . dexa  08/2010   osteopenia  . TONSILLECTOMY  01/26/1973    SOCIAL HISTORY:   Social History   Tobacco Use  . Smoking status: Former Smoker    Packs/day: 2.00    Years: 20.00    Pack years: 40.00    Types: Cigarettes    Quit date: 01/26/1978    Years since quitting:  40.5  . Smokeless tobacco: Never Used  Substance Use Topics  . Alcohol use: No    FAMILY HISTORY:   Family History  Problem Relation Age of Onset  . Hyperlipidemia Mother   . CAD Mother        CABG  . Hypertension Mother   . Hyperlipidemia Father   . CAD Father        MI  . Sudden death Father   . Arthritis Maternal Grandmother   . Cancer Sister        breast  . Breast cancer Sister 58  . Alzheimer's disease Other   . Heart attack Other   . Diabetes Neg Hx   . Stroke Neg Hx     DRUG ALLERGIES:   Allergies  Allergen Reactions  . Sulfa Antibiotics Nausea Only    REVIEW OF SYSTEMS:  CONSTITUTIONAL: No fever, fatigue or weakness.  EYES: No blurred or double vision.  EARS, NOSE, AND THROAT: No tinnitus or ear pain.  RESPIRATORY: No cough, shortness of breath, wheezing or hemoptysis.  CARDIOVASCULAR: No chest pain, orthopnea, edema.  GASTROINTESTINAL: Reports epigastric abdominal pain associated with nausea and vomiting denies any diarrhea  GENITOURINARY: No dysuria, hematuria.  ENDOCRINE: No polyuria, nocturia,  HEMATOLOGY: No anemia, easy bruising or bleeding SKIN: No rash or lesion. MUSCULOSKELETAL: No joint pain or arthritis.   NEUROLOGIC: No tingling,  numbness, weakness.  PSYCHIATRY: No anxiety or depression.   MEDICATIONS AT HOME:   Prior to Admission medications   Medication Sig Start Date End Date Taking? Authorizing Provider  cholecalciferol (VITAMIN D3) 25 MCG (1000 UT) tablet Take 2,000 Units by mouth daily.   Yes [provider]  Cyanocobalamin (VITAMIN B-12) 2500 MCG SUBL Place 1 tablet (2,500 mcg total) under the tongue daily. 02/16/17  Yes Ria Bush, MD  fluticasone [facility]) 50 MCG/ACT nasal spray Place 2 sprays into both nostrils daily as needed. 06/20/15  Yes Ria Bush, MD  Vitamin D, Ergocalciferol, (DRISDOL) 1.25 MG (50000 UT) CAPS capsule Take 1 capsule (50,000 Units total) by mouth once a week. 07/14/18  Yes Abby Potash, PA-C      VITAL SIGNS:  Blood pressure 129/84, pulse 67, temperature 98.7 F (37.1 C), temperature source Oral, resp. rate 18, height 5\' 4"  (1.626 m), weight 84.8 kg, SpO2 99 %.  PHYSICAL EXAMINATION:  GENERAL:  73 y.o.-year-old patient lying in the bed with no acute distress.  EYES: Pupils equal, round, reactive to light and accommodation. No scleral icterus. Extraocular muscles intact.  HEENT: Head atraumatic, normocephalic. Oropharynx and nasopharynx clear.  NECK:  Supple, no jugular venous distention. No thyroid enlargement, no tenderness.  LUNGS: Normal breath sounds bilaterally, no wheezing, rales,rhonchi or crepitation. No use of accessory muscles of respiration.  CARDIOVASCULAR: S1, S2 normal. No murmurs, rubs, or gallops.  ABDOMEN: Soft, positive epigastric abdominal tenderness but no rebound tenderness, nondistended. Bowel sounds present.  EXTREMITIES: No pedal edema, cyanosis, or clubbing.  NEUROLOGIC: Cranial nerves II through XII are intact. Muscle strength 5/5 in all extremities. Sensation intact. Gait not checked.  PSYCHIATRIC: The patient is alert and oriented x 3.  SKIN: No obvious rash, lesion, or ulcer.   LABORATORY PANEL:   CBC Recent Labs  Lab 07/28/18 1323  WBC 18.7 Repeated and verified X2.*  HGB 14.7  HCT 44.7  PLT 188.0   ------------------------------------------------------------------------------------------------------------------  Chemistries  Recent Labs  Lab 07/28/18 1323  NA 141  K 4.2  CL 104  CO2 26  GLUCOSE 89  BUN 11  CREATININE 0.78  CALCIUM 9.1  AST 219*  ALT 319*  ALKPHOS 150*  BILITOT 1.0   ------------------------------------------------------------------------------------------------------------------  Cardiac Enzymes No results for input(s): TROPONINI in the last 168 hours. ------------------------------------------------------------------------------------------------------------------  RADIOLOGY:  US  Abdomen Complete  Result Date: 07/28/2018 CLINICAL DATA:  Epigastric and right upper quadrant abdominal pain. EXAM: ABDOMEN ULTRASOUND COMPLETE COMPARISON:  None. FINDINGS: Gallbladder: There is gallbladder wall thickening with the gallbladder measuring approximately 6 mm in thickness. There is gallbladder sludge with multiple gallstones. The sonographic Percell Miller sign is reported as negative. There may be some trace pericholecystic free fluid. Common bile duct: Diameter: 0.4 cm Liver: Diffuse increased echogenicity with slightly heterogeneous liver. Appearance typically secondary to fatty infiltration. Fibrosis secondary consideration. No secondary findings of cirrhosis noted. No focal hepatic lesion or intrahepatic biliary duct dilatation. There is a 1.5 cm cyst in the right hepatic lobe. Portal vein is patent on color Doppler imaging with normal direction of blood flow towards the liver. IVC: No abnormality visualized. Pancreas: Not well evaluated secondary to overlying bowel gas. Spleen: Size and appearance within normal limits. Right Kidney: Length: 10 cm. Echogenicity within normal limits. No mass or hydronephrosis visualized. Left Kidney: Length: 10 cm. Echogenicity within normal limits. No mass or hydronephrosis visualized. Abdominal aorta: No aneurysm visualized. Other findings: None. IMPRESSION: 1. Cholelithiasis with gallbladder wall thickening and pericholecystic free fluid in the  absence of a positive sonographic Percell Miller sign is equivocal for acute cholecystitis. Correlation with laboratory studies and physical exam is recommended. 2. Diffuse increased echogenicity with slightly heterogeneous liver. Appearance typically secondary to fatty infiltration. 3. Pancreas poorly evaluated secondary to overlying bowel gas. These results will be called to the ordering clinician or representative by the Radiologist Assistant, and communication documented in the PACS or zVision Dashboard. Electronically Signed   By:  Constance Holster M.D.   On: 07/28/2018 14:42    EKG:   Orders placed or performed in visit on 11/25/17  . EKG 12-Lead    IMPRESSION AND PLAN:    #Acute gallstone pancreatitis Admit to MedSurg unit N.p.o., IV fluids, antiemetics and supportive treatment Surgery consult placed and notify Dr. Celine Ahr she is aware of the consult COVID pending  #Obstructive sleep apnea continue CPAP nightly  #Allergic rhinitis continue home medication Flovent  #Vitamin D Sze with deficiency hold p.o. medications at this time  #Hyperlipidemia-holding home medications as patient is n.p.o.  #CLL continue outpatient follow-up with the primary oncologist for surveillance   All the records are reviewed and case discussed with ED provider. Management plans discussed with the patient, family and they are in agreement.  CODE STATUS: Full code  TOTAL TIME TAKING CARE OF THIS PATIENT: 45 minutes.   Note: This dictation was prepared with Dragon dictation along with smaller phrase technology. Any transcriptional errors that result from this process are unintentional.  Nicholes Mango M.D on 07/28/2018 at 7:16 PM  Between 7am to 6pm - Pager - [phone]  After 6pm go to [email or web address] - password [email or web address] [facility]  [facility]  Office  [phone]  CC: Primary care physician; Ria Bush, MD

## 2018-07-28 NOTE — Telephone Encounter (Signed)
Opened in error

## 2018-07-28 NOTE — Telephone Encounter (Signed)
[facility] lab called to report a critical  White count 18.7 Possible variant lymphs

## 2018-07-28 NOTE — Telephone Encounter (Signed)
Rosaria Ferries [facility] said when [name] calls report to call Dr Darnell Level on cell. I spoke with Joy at [facility] [name] and she took Dr Synthia Innocent cell # [phone] and [name] will call report directly to Dr Synthia Innocent cell. Dr Darnell Level will be out of office this afternoon and he said that would be OK.

## 2018-07-28 NOTE — Telephone Encounter (Signed)
Yes plz offer in office or virtual visit. Thank you.

## 2018-07-28 NOTE — Telephone Encounter (Signed)
Scheduled MyChart visit today at 12:00.

## 2018-07-28 NOTE — Progress Notes (Signed)
Family Meeting Note  Advance Directive:yes  Today a meeting took place with the Patient.    The following clinical team members were present during this meeting:MD  The following were discussed:Patient's diagnosis: Acute gallstone pancreatitis, obstructive sleep apnea, hyperlipidemia and other medical problems and plan of care discussed and related with the patient patient verbalized understanding of the plan,   Patient's progosis: Unable to determine and Goals for treatment: Full Code  Additional follow-up to be provided: Hospitalist spouse is the healthcare power of attorney  Time spent during discussion:17 min  Nicholes Mango, MD

## 2018-07-28 NOTE — ED Provider Notes (Signed)
[facility] Emergency Department Provider Note   ____________________________________________    I have reviewed the triage vital signs and the nursing notes.   HISTORY  Chief Complaint Abdominal Pain     HPI Lori Shaw is a 73 y.o. female with a history of CLL who presents with complaints of abdominal pain.  Patient reports on Tuesday she had abrupt onset of severe epigastric pain with nausea, this has subsided somewhat but she continues to have epigastric pain.  She had ultrasound performed by PCP which demonstrated possible cholecystitis, referred to the emergency department.  She describes epigastric abdominal pain which is moderate to severe, worse with eating.  No fevers or chills.  No myalgias.  Has not had this before.  Past Medical History:  Diagnosis Date  . B12 deficiency   . Back pain   . CLL (chronic lymphocytic leukemia) ([place]) 06/21/2016   Rai stage 0, dx 06/2016 (Dr Janese Banks)  . HLD (hyperlipidemia)   . Joint pain   . Knee problem   . OSA on CPAP 2006, 04/2014   AHI of 23.8 and an RDI of 42.9, on CPAP 9 cm H2O (Dohmeier)  . Osteopenia 08/2010   T -1.4 at L femur neck  . Piriformis syndrome 12/14/2014  . Vitamin D deficiency     Patient Active Problem List   Diagnosis Date Noted  . Insulin resistance 03/21/2018  . Class 1 obesity with serious comorbidity and body mass index (BMI) of 31.0 to 31.9 in adult 02/01/2018  . Upper abdominal pain 09/25/2016  . Vitamin D deficiency 06/30/2016  . CLL (chronic lymphocytic leukemia) ([place]) 06/21/2016  . Vitamin B12 deficiency 06/21/2016  . ETD (eustachian tube dysfunction) 04/27/2016  . Rash and nonspecific skin eruption 08/23/2015  . Health maintenance examination 06/20/2015  . Advanced care planning/counseling discussion 06/20/2015  . Piriformis syndrome of left side 12/14/2014  . Hypersomnia, persistent 09/18/2014  . Obesity, Class I, BMI 30-34.9 09/18/2014  . Sleep related  headaches 04/24/2014  . Medicare annual wellness visit, subsequent 05/04/2013  . Seasonal allergic rhinitis 01/17/2013  . HLD (hyperlipidemia)   . OSA on CPAP   . Osteopenia 08/27/2010    Past Surgical History:  Procedure Laterality Date  . ABDOMINAL HYSTERECTOMY  01/27/1983   for menometrorrhagia, one ovary remained  . COLONOSCOPY  2012   no records available. per patient normal.   . dexa  08/2010   osteopenia  . TONSILLECTOMY  01/26/1973    Prior to Admission medications   Medication Sig Start Date End Date Taking? Authorizing Provider  Cholecalciferol (VITAMIN D) 2000 units CAPS Take 1 capsule by mouth daily.    [provider]  Cyanocobalamin (VITAMIN B-12) 2500 MCG SUBL Place 1 tablet (2,500 mcg total) under the tongue daily. Patient not taking: Reported on 07/28/2018 02/16/17   Ria Bush, MD  fluticasone [facility]) 50 MCG/ACT nasal spray Place 2 sprays into both nostrils daily as needed. 06/20/15   Ria Bush, MD  Vitamin D, Ergocalciferol, (DRISDOL) 1.25 MG (50000 UT) CAPS capsule Take 1 capsule (50,000 Units total) by mouth once a week. Patient not taking: Reported on 07/28/2018 07/14/18   Abby Potash, PA-C     Allergies Sulfa antibiotics  Family History  Problem Relation Age of Onset  . Hyperlipidemia Mother   . CAD Mother        CABG  . Hypertension Mother   . Hyperlipidemia Father   . CAD Father        MI  .  Sudden death Father   . Arthritis Maternal Grandmother   . Cancer Sister        breast  . Breast cancer Sister 31  . Alzheimer's disease Other   . Heart attack Other   . Diabetes Neg Hx   . Stroke Neg Hx     Social History Social History   Tobacco Use  . Smoking status: Former Smoker    Packs/day: 2.00    Years: 20.00    Pack years: 40.00    Types: Cigarettes    Quit date: 01/26/1978    Years since quitting: 40.5  . Smokeless tobacco: Never Used  Substance Use Topics  . Alcohol use: No  . Drug use: Yes    Comment:  occassionally glass of wine    Review of Systems  Constitutional: No fever/chills Eyes: No visual changes.  ENT: No sore throat. Cardiovascular: Denies chest pain. Respiratory: Denies shortness of breath. Gastrointestinal: As above Genitourinary: Negative for dysuria. Musculoskeletal: Negative for back pain. Skin: Negative for rash. Neurological: Negative for headaches or weakness   ____________________________________________   PHYSICAL EXAM:  VITAL SIGNS: ED Triage Vitals [07/28/18 1536]  Enc Vitals Group     BP 129/84     Pulse Rate 67     Resp 18     Temp 98.7 F (37.1 C)     Temp Source Oral     SpO2 99 %     Weight 84.8 kg (187 lb)     Height 1.626 m (5\' 4" )     Head Circumference      Peak Flow      Pain Score 4     Pain Loc      Pain Edu?      Excl. in [place]?     Constitutional: Alert and oriented.  Eyes: Conjunctivae are normal.  Head: Atraumatic. Nose: No congestion/rhinnorhea. Mouth/Throat: Mucous membranes are moist.   Neck:  Painless ROM Cardiovascular: Normal rate, regular rhythm. Grossly normal heart sounds.  Good peripheral circulation. Respiratory: Normal respiratory effort.  No retractions. Lungs CTAB. Gastrointestinal: Epigastric tenderness to palpation, no distention, no CVA tenderness  Musculoskeletal: No lower extremity tenderness nor edema.  Warm and well perfused Neurologic:  Normal speech and language. No gross focal neurologic deficits are appreciated.  Skin:  Skin is warm, dry and intact. No rash noted. Psychiatric: Mood and affect are normal. Speech and behavior are normal.  ____________________________________________   LABS (all labs ordered are listed, but only abnormal results are displayed)  Labs Reviewed  URINALYSIS, COMPLETE (UACMP) WITH MICROSCOPIC   ____________________________________________  EKG  None ____________________________________________  RADIOLOGY  Reviewed outpatient ultrasound, cholelithiasis  with gallbladder wall thickening and cholecystic fluid ____________________________________________   PROCEDURES  Procedure(s) performed: No  Procedures   Critical Care performed: No ____________________________________________   INITIAL IMPRESSION / ASSESSMENT AND PLAN / ED COURSE  Pertinent labs & imaging results that were available during my care of the patient were reviewed by me and considered in my medical decision making (see chart for details).  Patient presents with epigastric pain with lipase greater than 2000 consistent with pancreatitis, suspicious for gallstone pancreatitis given cholelithiasis and para cholecystic fluid.  Discussed with Dr. Celine Ahr of surgery recommends admission to medicine, she will see the patient in the morning.  We will treat the patient with IV morphine, IV Zofran, n.p.o.  We will send COVID swab.    ____________________________________________   FINAL CLINICAL IMPRESSION(S) / ED DIAGNOSES  Final diagnoses:  Acute biliary pancreatitis,  unspecified complication status        Note:  This document was prepared using Dragon voice recognition software and may include unintentional dictation errors.   Lavonia Drafts, MD 07/28/18 [id number]

## 2018-07-28 NOTE — Assessment & Plan Note (Addendum)
Epigastric and RUQ abd pain with radiation to R scapula. High suspicion for gallstone disease, r/o PUD, gastritis, pancreatitis etc - check CBC, CMP, lipase and abd [name] today. Consider adding PPI. Pt agrees with plan.

## 2018-07-28 NOTE — Telephone Encounter (Signed)
Should we schedule a virtual visit?

## 2018-07-29 ENCOUNTER — Other Ambulatory Visit: Payer: Self-pay

## 2018-07-29 ENCOUNTER — Encounter: Payer: Self-pay | Admitting: Family Medicine

## 2018-07-29 DIAGNOSIS — K851 Biliary acute pancreatitis without necrosis or infection: Principal | ICD-10-CM

## 2018-07-29 DIAGNOSIS — K760 Fatty (change of) liver, not elsewhere classified: Secondary | ICD-10-CM | POA: Insufficient documentation

## 2018-07-29 LAB — URINALYSIS, COMPLETE (UACMP) WITH MICROSCOPIC
Bacteria, UA: NONE SEEN
Bilirubin Urine: NEGATIVE
Glucose, UA: NEGATIVE mg/dL
Ketones, ur: 20 mg/dL — AB
Nitrite: NEGATIVE
Protein, ur: NEGATIVE mg/dL
Specific Gravity, Urine: 1.016 (ref 1.005–1.030)
pH: 5 (ref 5.0–8.0)

## 2018-07-29 LAB — COMPREHENSIVE METABOLIC PANEL
ALT: 200 U/L — ABNORMAL HIGH (ref 0–44)
AST: 97 U/L — ABNORMAL HIGH (ref 15–41)
Albumin: 3.3 g/dL — ABNORMAL LOW (ref 3.5–5.0)
Alkaline Phosphatase: 100 U/L (ref 38–126)
Anion gap: 6 (ref 5–15)
BUN: 9 mg/dL (ref 8–23)
CO2: 23 mmol/L (ref 22–32)
Calcium: 8 mg/dL — ABNORMAL LOW (ref 8.9–10.3)
Chloride: 111 mmol/L (ref 98–111)
Creatinine, Ser: 0.66 mg/dL (ref 0.44–1.00)
GFR calc Af Amer: >60 mL/min (ref >60)
GFR calc non Af Amer: >60 mL/min (ref >60)
Glucose, Bld: 95 mg/dL (ref 70–99)
Potassium: 3.9 mmol/L (ref 3.5–5.1)
Sodium: 140 mmol/L (ref 135–145)
Total Bilirubin: 0.8 mg/dL (ref 0.3–1.2)
Total Protein: 5.5 g/dL — ABNORMAL LOW (ref 6.5–8.1)

## 2018-07-29 LAB — LIPASE, BLOOD: Lipase: 355 U/L — ABNORMAL HIGH (ref 11–51)

## 2018-07-29 LAB — CBC
HCT: 42.1 % (ref 36.0–46.0)
Hemoglobin: 13.6 g/dL (ref 12.0–15.0)
MCH: 30.7 pg (ref 26.0–34.0)
MCHC: 32.3 g/dL (ref 30.0–36.0)
MCV: 95 fL (ref 80.0–100.0)
Platelets: 182 10*3/uL (ref 150–400)
RBC: 4.43 MIL/uL (ref 3.87–5.11)
RDW: 14.1 % (ref 11.5–15.5)
WBC: 18.7 10*3/uL — ABNORMAL HIGH (ref 4.0–10.5)
nRBC: 0 % (ref 0.0–0.2)

## 2018-07-29 MED ORDER — MORPHINE SULFATE (PF) 2 MG/ML IV SOLN
2.0000 mg | INTRAVENOUS | Status: DC | PRN
  Administered 2018-08-01: 2 mg via INTRAVENOUS
  Filled 2018-07-29: qty 1

## 2018-07-29 NOTE — Progress Notes (Signed)
Patients CPAP inspected and found to be compliant with no frayed wires or cracked unit. Will call for inspection.

## 2018-07-29 NOTE — Plan of Care (Signed)
  Problem: Coping: Goal: Level of anxiety will decrease Outcome: Completed/Met   Problem: Skin Integrity: Goal: Risk for impaired skin integrity will decrease Outcome: Completed/Met   

## 2018-07-29 NOTE — Progress Notes (Signed)
cpap declined. Pt prefers to use her home unit which her husband will bring in.

## 2018-07-29 NOTE — Telephone Encounter (Signed)
Noted. Sent to ER after [name] results returned.

## 2018-07-29 NOTE — H&P (View-Only) (Signed)
Reason for Consult: Gallstone pancreatitis Referring Physician: Gouru, Hospitalist  Lori Shaw is an 74 y.o. female.  HPI: She presented to the emergency department at [facility] at the direction of her primary care provider.  She had had approximately 48 hours of severe mid epigastric abdominal pain.  This was accompanied by retching without productive vomiting.  She contacted her primary care provider's office and had an ultrasound and labs performed.  The ultrasound result suggested cholelithiasis and as a result, she was sent to the emergency room.  While she was waiting, her laboratory values returned.  These were most notable for a lipase greater than 2000.  She was admitted to the hospitalist service for supportive treatment of presumed gallstone pancreatitis.  General surgery was consulted to evaluate for cholecystectomy.  Lori Shaw states that she has never had pain similar to this in the past.  She denies any history of jaundice although she states that currently, her urine is fairly dark and tea colored.  She did not have any fevers or chills.  The pain was localized to the mid epigastrium.  She cannot identify any exacerbating or alleviating factors.  Past Medical History:  Diagnosis Date  . B12 deficiency   . Back pain   . CLL (chronic lymphocytic leukemia) ([place]) 06/21/2016   Rai stage 0, dx 06/2016 (Dr Janese Banks)  . HLD (hyperlipidemia)   . Joint pain   . Knee problem   . OSA on CPAP 2006, 04/2014   AHI of 23.8 and an RDI of 42.9, on CPAP 9 cm H2O ([place])  . Osteopenia 08/2010   T -1.4 at L femur neck  . Piriformis syndrome 12/14/2014  . Vitamin D deficiency     Past Surgical History:  Procedure Laterality Date  . ABDOMINAL HYSTERECTOMY  01/27/1983   for menometrorrhagia, one ovary remained  . COLONOSCOPY  2012   no records available. per patient normal.   . dexa  08/2010   osteopenia  . TONSILLECTOMY  01/26/1973    Family History  Problem  Relation Age of Onset  . Hyperlipidemia Mother   . CAD Mother        CABG  . Hypertension Mother   . Hyperlipidemia Father   . CAD Father        MI  . Sudden death Father   . Arthritis Maternal Grandmother   . Cancer Sister        breast  . Breast cancer Sister 52  . Alzheimer's disease Other   . Heart attack Other   . Diabetes Neg Hx   . Stroke Neg Hx     Social History:  reports that she quit smoking about 40 years ago. Her smoking use included cigarettes. She has a 40.00 pack-year smoking history. She has never used smokeless tobacco. She reports current drug use. She reports that she does not drink alcohol.  Allergies:  Allergies  Allergen Reactions  . Sulfa Antibiotics Nausea Only    Medications: I have reviewed the patient's current medications.  Results for orders placed or performed during the hospital encounter of 07/28/18 (from the past 48 hour(s))  SARS Coronavirus 2 (CEPHEID - Performed in [place] hospital lab), Hosp Order     Status: None   Collection Time: 07/28/18  6:33 PM   Specimen: Nasopharyngeal Swab  Result Value Ref Range   SARS Coronavirus 2 NEGATIVE NEGATIVE    Comment: (NOTE) If result is NEGATIVE SARS-CoV-2 target nucleic acids are NOT DETECTED. The  SARS-CoV-2 RNA is generally detectable in upper and lower  respiratory specimens during the acute phase of infection. The lowest  concentration of SARS-CoV-2 viral copies this assay can detect is 250  copies / mL. A negative result does not preclude SARS-CoV-2 infection  and should not be used as the sole basis for treatment or other  patient management decisions.  A negative result may occur with  improper specimen collection / handling, submission of specimen other  than nasopharyngeal swab, presence of viral mutation(s) within the  areas targeted by this assay, and inadequate number of viral copies  (<250 copies / mL). A negative result must be combined with clinical  observations, patient  history, and epidemiological information. If result is POSITIVE SARS-CoV-2 target nucleic acids are DETECTED. The SARS-CoV-2 RNA is generally detectable in upper and lower  respiratory specimens dur ing the acute phase of infection.  Positive  results are indicative of active infection with SARS-CoV-2.  Clinical  correlation with patient history and other diagnostic information is  necessary to determine patient infection status.  Positive results do  not rule out bacterial infection or co-infection with other viruses. If result is PRESUMPTIVE POSTIVE SARS-CoV-2 nucleic acids MAY BE PRESENT.   A presumptive positive result was obtained on the submitted specimen  and confirmed on repeat testing.  While 2019 novel coronavirus  (SARS-CoV-2) nucleic acids may be present in the submitted sample  additional confirmatory testing may be necessary for epidemiological  and / or clinical management purposes  to differentiate between  SARS-CoV-2 and other Sarbecovirus currently known to infect humans.  If clinically indicated additional testing with an alternate test  methodology [phone]) is advised. The SARS-CoV-2 RNA is generally  detectable in upper and lower respiratory sp ecimens during the acute  phase of infection. The expected result is Negative. Fact Sheet for Patients:  StrictlyIdeas.no Fact Sheet for Healthcare Providers: BankingDealers.co.za This test is not yet approved or cleared by the [place] FDA and has been authorized for detection and/or diagnosis of SARS-CoV-2 by FDA under an Emergency Use Authorization (EUA).  This EUA will remain in effect (meaning this test can be used) for the duration of the COVID-19 declaration under Section 564(b)(1) of the Act, 21 U.S.C. section 360bbb-3(b)(1), unless the authorization is terminated or revoked sooner. Performed at [facility], [address] [place], [place]  [address]   Lipase, blood     Status: Abnormal   Collection Time: 07/29/18  4:58 AM  Result Value Ref Range   Lipase 355 (H) 11 - 51 U/L    Comment: Performed at [facility], [address] [place], [place] [address]  CBC     Status: Abnormal   Collection Time: 07/29/18  4:58 AM  Result Value Ref Range   WBC 18.7 (H) 4.0 - 10.5 K/uL   RBC 4.43 3.87 - 5.11 MIL/uL   Hemoglobin 13.6 12.0 - 15.0 g/dL   HCT 42.1 36.0 - 46.0 %   MCV 95.0 80.0 - 100.0 fL   MCH 30.7 26.0 - 34.0 pg   MCHC 32.3 30.0 - 36.0 g/dL   RDW 14.1 11.5 - 15.5 %   Platelets 182 150 - 400 K/uL   nRBC 0.0 0.0 - 0.2 %    Comment: Performed at [facility], [address], [place], [place] [address]  Comprehensive metabolic panel     Status: Abnormal   Collection Time: 07/29/18  4:58 AM  Result Value Ref Range   Sodium 140  135 - 145 mmol/L   Potassium 3.9 3.5 - 5.1 mmol/L   Chloride 111 98 - 111 mmol/L   CO2 23 22 - 32 mmol/L   Glucose, Bld 95 70 - 99 mg/dL   BUN 9 8 - 23 mg/dL   Creatinine, Ser 0.66 0.44 - 1.00 mg/dL   Calcium 8.0 (L) 8.9 - 10.3 mg/dL   Total Protein 5.5 (L) 6.5 - 8.1 g/dL   Albumin 3.3 (L) 3.5 - 5.0 g/dL   AST 97 (H) 15 - 41 U/L   ALT 200 (H) 0 - 44 U/L   Alkaline Phosphatase 100 38 - 126 U/L   Total Bilirubin 0.8 0.3 - 1.2 mg/dL   GFR calc non Af Amer >60 >60 mL/min   GFR calc Af Amer >60 >60 mL/min   Anion gap 6 5 - 15    Comment: Performed at [facility], [address] [place], [place] [address]  Urinalysis, Complete w Microscopic     Status: Abnormal   Collection Time: 07/29/18  7:00 AM  Result Value Ref Range   Color, Urine YELLOW (A) YELLOW   APPearance CLEAR (A) CLEAR   Specific Gravity, Urine 1.016 1.005 - 1.030   pH 5.0 5.0 - 8.0   Glucose, UA NEGATIVE NEGATIVE mg/dL   Hgb urine dipstick SMALL (A) NEGATIVE   Bilirubin Urine NEGATIVE NEGATIVE   Ketones, ur 20 (A) NEGATIVE mg/dL   Protein, ur NEGATIVE NEGATIVE mg/dL   Nitrite NEGATIVE NEGATIVE    Leukocytes,Ua SMALL (A) NEGATIVE   RBC / HPF 0-5 0 - 5 RBC/hpf   WBC, UA 6-10 0 - 5 WBC/hpf   Bacteria, UA NONE SEEN NONE SEEN   Squamous Epithelial / LPF 0-5 0 - 5   Mucus PRESENT     Comment: Performed at [facility], [address] [place], [place] [address]    US Abdomen Complete  Result Date: 07/28/2018 CLINICAL DATA:  Epigastric and right upper quadrant abdominal pain. EXAM: ABDOMEN ULTRASOUND COMPLETE COMPARISON:  None. FINDINGS: Gallbladder: There is gallbladder wall thickening with the gallbladder measuring approximately 6 mm in thickness. There is gallbladder sludge with multiple gallstones. The sonographic Percell Miller sign is reported as negative. There may be some trace pericholecystic free fluid. Common bile duct: Diameter: 0.4 cm Liver: Diffuse increased echogenicity with slightly heterogeneous liver. Appearance typically secondary to fatty infiltration. Fibrosis secondary consideration. No secondary findings of cirrhosis noted. No focal hepatic lesion or intrahepatic biliary duct dilatation. There is a 1.5 cm cyst in the right hepatic lobe. Portal vein is patent on color Doppler imaging with normal direction of blood flow towards the liver. IVC: No abnormality visualized. Pancreas: Not well evaluated secondary to overlying bowel gas. Spleen: Size and appearance within normal limits. Right Kidney: Length: 10 cm. Echogenicity within normal limits. No mass or hydronephrosis visualized. Left Kidney: Length: 10 cm. Echogenicity within normal limits. No mass or hydronephrosis visualized. Abdominal aorta: No aneurysm visualized. Other findings: None. IMPRESSION: 1. Cholelithiasis with gallbladder wall thickening and pericholecystic free fluid in the absence of a positive sonographic Percell Miller sign is equivocal for acute cholecystitis. Correlation with laboratory studies and physical exam is recommended. 2. Diffuse increased echogenicity with slightly heterogeneous liver. Appearance typically  secondary to fatty infiltration. 3. Pancreas poorly evaluated secondary to overlying bowel gas. These results will be called to the ordering clinician or representative by the Radiologist Assistant, and communication documented in the PACS or zVision Dashboard. Electronically Signed   By: Jamie Kato.D.  On: 07/28/2018 14:42    Review of Systems  Constitutional: Positive for malaise/fatigue.  Gastrointestinal: Positive for abdominal pain.  Skin: Negative for itching.  All other systems reviewed and are negative.  Blood pressure (!) 102/57, pulse (!) 55, temperature 97.6 F (36.4 C), resp. rate 16, height 5\' 4"  (1.626 m), weight 84.8 kg, SpO2 99 %. Physical Exam  Constitutional: She is oriented to person, place, and time. She appears well-developed and well-nourished. No distress.  She is obese.  HENT:  Head: Normocephalic and atraumatic.  Mouth and nose are covered by mask secondary to COVID-19 precautions  Eyes: No scleral icterus.  Neck: No tracheal deviation present.  Cardiovascular: Normal rate and regular rhythm.  Respiratory: Effort normal. No stridor.  GI: Soft. Bowel sounds are normal. She exhibits no distension and no mass. There is no abdominal tenderness. There is no rebound and no guarding.  Genitourinary:    Genitourinary Comments: Deferred   Musculoskeletal:        General: No tenderness.  Neurological: She is alert and oriented to person, place, and time.  Skin: Skin is warm and dry.  Psychiatric: She has a normal mood and affect. Her behavior is normal. Judgment and thought content normal.    Assessment/Plan: This is a 73 year-old woman with multiple medical comorbidities who presented to the emergency department at [facility] with abdominal pain.  She was found to have pancreatitis, presumably gallstone in origin.  She did not have any evidence of retained stone on imaging or labs.  Her lipase was over 2000 on admission; today it is  just over 350.  Her white blood cell count is elevated, but this is lymphocyte predominant and likely related to her chronic lymphocytic leukemia.  I have recommended that she undergo a laparoscopic cholecystectomy during this admission. I discussed the procedure in detail.  We discussed the risks and benefits of a laparoscopic cholecystectomy and possible cholangiogram including, but not limited to: bleeding, infection, injury to surrounding structures such as the intestine or liver, bile leak, retained gallstones, need to convert to an open procedure, prolonged diarrhea, blood clots such as DVT, common bile duct injury, anesthesia risks, and possible need for additional procedures. The patient had the opportunity to ask any questions and these were answered to her satisfaction.  I would like her lipase to be completely normalized prior to proceeding, due to the profound inflammation and potential increased risk of surgery.  We will monitor her labs and tentatively put her on the schedule for either Monday or Tuesday of next week.  Fredirick Maudlin 07/29/2018, 5:18 PM

## 2018-07-29 NOTE — Consult Note (Signed)
Reason for Consult: Gallstone pancreatitis Referring Physician: Gouru, Hospitalist  Lori Shaw is an 73 y.o. female.  HPI: She presented to the emergency department at [facility] at the direction of her primary care provider.  She had had approximately 48 hours of severe mid epigastric abdominal pain.  This was accompanied by retching without productive vomiting.  She contacted her primary care provider's office and had an ultrasound and labs performed.  The ultrasound result suggested cholelithiasis and as a result, she was sent to the emergency room.  While she was waiting, her laboratory values returned.  These were most notable for a lipase greater than 2000.  She was admitted to the hospitalist service for supportive treatment of presumed gallstone pancreatitis.  General surgery was consulted to evaluate for cholecystectomy.  Lori Shaw states that she has never had pain similar to this in the past.  She denies any history of jaundice although she states that currently, her urine is fairly dark and tea colored.  She did not have any fevers or chills.  The pain was localized to the mid epigastrium.  She cannot identify any exacerbating or alleviating factors.  Past Medical History:  Diagnosis Date  . B12 deficiency   . Back pain   . CLL (chronic lymphocytic leukemia) ([place]) 06/21/2016   Rai stage 0, dx 06/2016 (Dr Janese Banks)  . HLD (hyperlipidemia)   . Joint pain   . Knee problem   . OSA on CPAP 2006, 04/2014   AHI of 23.8 and an RDI of 42.9, on CPAP 9 cm H2O ([place])  . Osteopenia 08/2010   T -1.4 at L femur neck  . Piriformis syndrome 12/14/2014  . Vitamin D deficiency     Past Surgical History:  Procedure Laterality Date  . ABDOMINAL HYSTERECTOMY  01/27/1983   for menometrorrhagia, one ovary remained  . COLONOSCOPY  2012   no records available. per patient normal.   . dexa  08/2010   osteopenia  . TONSILLECTOMY  01/26/1973    Family History  Problem  Relation Age of Onset  . Hyperlipidemia Mother   . CAD Mother        CABG  . Hypertension Mother   . Hyperlipidemia Father   . CAD Father        MI  . Sudden death Father   . Arthritis Maternal Grandmother   . Cancer Sister        breast  . Breast cancer Sister 24  . Alzheimer's disease Other   . Heart attack Other   . Diabetes Neg Hx   . Stroke Neg Hx     Social History:  reports that she quit smoking about 40 years ago. Her smoking use included cigarettes. She has a 40.00 pack-year smoking history. She has never used smokeless tobacco. She reports current drug use. She reports that she does not drink alcohol.  Allergies:  Allergies  Allergen Reactions  . Sulfa Antibiotics Nausea Only    Medications: I have reviewed the patient's current medications.  Results for orders placed or performed during the hospital encounter of 07/28/18 (from the past 48 hour(s))  SARS Coronavirus 2 (CEPHEID - Performed in [place] hospital lab), Hosp Order     Status: None   Collection Time: 07/28/18  6:33 PM   Specimen: Nasopharyngeal Swab  Result Value Ref Range   SARS Coronavirus 2 NEGATIVE NEGATIVE    Comment: (NOTE) If result is NEGATIVE SARS-CoV-2 target nucleic acids are NOT DETECTED. The  SARS-CoV-2 RNA is generally detectable in upper and lower  respiratory specimens during the acute phase of infection. The lowest  concentration of SARS-CoV-2 viral copies this assay can detect is 250  copies / mL. A negative result does not preclude SARS-CoV-2 infection  and should not be used as the sole basis for treatment or other  patient management decisions.  A negative result may occur with  improper specimen collection / handling, submission of specimen other  than nasopharyngeal swab, presence of viral mutation(s) within the  areas targeted by this assay, and inadequate number of viral copies  (<250 copies / mL). A negative result must be combined with clinical  observations, patient  history, and epidemiological information. If result is POSITIVE SARS-CoV-2 target nucleic acids are DETECTED. The SARS-CoV-2 RNA is generally detectable in upper and lower  respiratory specimens dur ing the acute phase of infection.  Positive  results are indicative of active infection with SARS-CoV-2.  Clinical  correlation with patient history and other diagnostic information is  necessary to determine patient infection status.  Positive results do  not rule out bacterial infection or co-infection with other viruses. If result is PRESUMPTIVE POSTIVE SARS-CoV-2 nucleic acids MAY BE PRESENT.   A presumptive positive result was obtained on the submitted specimen  and confirmed on repeat testing.  While 2019 novel coronavirus  (SARS-CoV-2) nucleic acids may be present in the submitted sample  additional confirmatory testing may be necessary for epidemiological  and / or clinical management purposes  to differentiate between  SARS-CoV-2 and other Sarbecovirus currently known to infect humans.  If clinically indicated additional testing with an alternate test  methodology [phone]) is advised. The SARS-CoV-2 RNA is generally  detectable in upper and lower respiratory sp ecimens during the acute  phase of infection. The expected result is Negative. Fact Sheet for Patients:  StrictlyIdeas.no Fact Sheet for Healthcare Providers: BankingDealers.co.za This test is not yet approved or cleared by the [place] FDA and has been authorized for detection and/or diagnosis of SARS-CoV-2 by FDA under an Emergency Use Authorization (EUA).  This EUA will remain in effect (meaning this test can be used) for the duration of the COVID-19 declaration under Section 564(b)(1) of the Act, 21 U.S.C. section 360bbb-3(b)(1), unless the authorization is terminated or revoked sooner. Performed at [facility], [address] [place], [place]  [address]   Lipase, blood     Status: Abnormal   Collection Time: 07/29/18  4:58 AM  Result Value Ref Range   Lipase 355 (H) 11 - 51 U/L    Comment: Performed at [facility], [address] [place], [place] [address]  CBC     Status: Abnormal   Collection Time: 07/29/18  4:58 AM  Result Value Ref Range   WBC 18.7 (H) 4.0 - 10.5 K/uL   RBC 4.43 3.87 - 5.11 MIL/uL   Hemoglobin 13.6 12.0 - 15.0 g/dL   HCT 42.1 36.0 - 46.0 %   MCV 95.0 80.0 - 100.0 fL   MCH 30.7 26.0 - 34.0 pg   MCHC 32.3 30.0 - 36.0 g/dL   RDW 14.1 11.5 - 15.5 %   Platelets 182 150 - 400 K/uL   nRBC 0.0 0.0 - 0.2 %    Comment: Performed at [facility], [address], [place],  [address]  Comprehensive metabolic panel     Status: Abnormal   Collection Time: 07/29/18  4:58 AM  Result Value Ref Range   Sodium 140  135 - 145 mmol/L   Potassium 3.9 3.5 - 5.1 mmol/L   Chloride 111 98 - 111 mmol/L   CO2 23 22 - 32 mmol/L   Glucose, Bld 95 70 - 99 mg/dL   BUN 9 8 - 23 mg/dL   Creatinine, Ser 0.66 0.44 - 1.00 mg/dL   Calcium 8.0 (L) 8.9 - 10.3 mg/dL   Total Protein 5.5 (L) 6.5 - 8.1 g/dL   Albumin 3.3 (L) 3.5 - 5.0 g/dL   AST 97 (H) 15 - 41 U/L   ALT 200 (H) 0 - 44 U/L   Alkaline Phosphatase 100 38 - 126 U/L   Total Bilirubin 0.8 0.3 - 1.2 mg/dL   GFR calc non Af Amer >60 >60 mL/min   GFR calc Af Amer >60 >60 mL/min   Anion gap 6 5 - 15    Comment: Performed at [facility], [address] [place], [place] [address]  Urinalysis, Complete w Microscopic     Status: Abnormal   Collection Time: 07/29/18  7:00 AM  Result Value Ref Range   Color, Urine YELLOW (A) YELLOW   APPearance CLEAR (A) CLEAR   Specific Gravity, Urine 1.016 1.005 - 1.030   pH 5.0 5.0 - 8.0   Glucose, UA NEGATIVE NEGATIVE mg/dL   Hgb urine dipstick SMALL (A) NEGATIVE   Bilirubin Urine NEGATIVE NEGATIVE   Ketones, ur 20 (A) NEGATIVE mg/dL   Protein, ur NEGATIVE NEGATIVE mg/dL   Nitrite NEGATIVE NEGATIVE    Leukocytes,Ua SMALL (A) NEGATIVE   RBC / HPF 0-5 0 - 5 RBC/hpf   WBC, UA 6-10 0 - 5 WBC/hpf   Bacteria, UA NONE SEEN NONE SEEN   Squamous Epithelial / LPF 0-5 0 - 5   Mucus PRESENT     Comment: Performed at [facility], [address] [place], [place] [address]    US Abdomen Complete  Result Date: 07/28/2018 CLINICAL DATA:  Epigastric and right upper quadrant abdominal pain. EXAM: ABDOMEN ULTRASOUND COMPLETE COMPARISON:  None. FINDINGS: Gallbladder: There is gallbladder wall thickening with the gallbladder measuring approximately 6 mm in thickness. There is gallbladder sludge with multiple gallstones. The sonographic Percell Miller sign is reported as negative. There may be some trace pericholecystic free fluid. Common bile duct: Diameter: 0.4 cm Liver: Diffuse increased echogenicity with slightly heterogeneous liver. Appearance typically secondary to fatty infiltration. Fibrosis secondary consideration. No secondary findings of cirrhosis noted. No focal hepatic lesion or intrahepatic biliary duct dilatation. There is a 1.5 cm cyst in the right hepatic lobe. Portal vein is patent on color Doppler imaging with normal direction of blood flow towards the liver. IVC: No abnormality visualized. Pancreas: Not well evaluated secondary to overlying bowel gas. Spleen: Size and appearance within normal limits. Right Kidney: Length: 10 cm. Echogenicity within normal limits. No mass or hydronephrosis visualized. Left Kidney: Length: 10 cm. Echogenicity within normal limits. No mass or hydronephrosis visualized. Abdominal aorta: No aneurysm visualized. Other findings: None. IMPRESSION: 1. Cholelithiasis with gallbladder wall thickening and pericholecystic free fluid in the absence of a positive sonographic Percell Miller sign is equivocal for acute cholecystitis. Correlation with laboratory studies and physical exam is recommended. 2. Diffuse increased echogenicity with slightly heterogeneous liver. Appearance typically  secondary to fatty infiltration. 3. Pancreas poorly evaluated secondary to overlying bowel gas. These results will be called to the ordering clinician or representative by the Radiologist Assistant, and communication documented in the PACS or zVision Dashboard. Electronically Signed   By: Jamie Kato.D.  On: 07/28/2018 14:42    Review of Systems  Constitutional: Positive for malaise/fatigue.  Gastrointestinal: Positive for abdominal pain.  Skin: Negative for itching.  All other systems reviewed and are negative.  Blood pressure (!) 102/57, pulse (!) 55, temperature 97.6 F (36.4 C), resp. rate 16, height 5\' 4"  (1.626 m), weight 84.8 kg, SpO2 99 %. Physical Exam  Constitutional: She is oriented to person, place, and time. She appears well-developed and well-nourished. No distress.  She is obese.  HENT:  Head: Normocephalic and atraumatic.  Mouth and nose are covered by mask secondary to COVID-19 precautions  Eyes: No scleral icterus.  Neck: No tracheal deviation present.  Cardiovascular: Normal rate and regular rhythm.  Respiratory: Effort normal. No stridor.  GI: Soft. Bowel sounds are normal. She exhibits no distension and no mass. There is no abdominal tenderness. There is no rebound and no guarding.  Genitourinary:    Genitourinary Comments: Deferred   Musculoskeletal:        General: No tenderness.  Neurological: She is alert and oriented to person, place, and time.  Skin: Skin is warm and dry.  Psychiatric: She has a normal mood and affect. Her behavior is normal. Judgment and thought content normal.    Assessment/Plan: This is a 73 year-old woman with multiple medical comorbidities who presented to the emergency department at [facility] with abdominal pain.  She was found to have pancreatitis, presumably gallstone in origin.  She did not have any evidence of retained stone on imaging or labs.  Her lipase was over 2000 on admission; today it is  just over 350.  Her white blood cell count is elevated, but this is lymphocyte predominant and likely related to her chronic lymphocytic leukemia.  I have recommended that she undergo a laparoscopic cholecystectomy during this admission. I discussed the procedure in detail.  We discussed the risks and benefits of a laparoscopic cholecystectomy and possible cholangiogram including, but not limited to: bleeding, infection, injury to surrounding structures such as the intestine or liver, bile leak, retained gallstones, need to convert to an open procedure, prolonged diarrhea, blood clots such as DVT, common bile duct injury, anesthesia risks, and possible need for additional procedures. The patient had the opportunity to ask any questions and these were answered to her satisfaction.  I would like her lipase to be completely normalized prior to proceeding, due to the profound inflammation and potential increased risk of surgery.  We will monitor her labs and tentatively put her on the schedule for either Monday or Tuesday of next week.  Fredirick Maudlin 07/29/2018, 5:18 PM

## 2018-07-29 NOTE — Progress Notes (Signed)
[facility] NAME: Lori Shaw    MR#:  [id number]  DATE OF BIRTH:  05/11/1945  SUBJECTIVE:  CHIEF COMPLAINT:   Chief Complaint  Patient presents with  . Abdominal Pain   -Much improved abdominal pain this morning.  Admitted for gallstone pancreatitis  REVIEW OF SYSTEMS:  Review of Systems  Constitutional: Positive for malaise/fatigue. Negative for chills and fever.  HENT: Negative for congestion, ear discharge, hearing loss and nosebleeds.   Eyes: Negative for blurred vision and double vision.  Respiratory: Negative for cough, shortness of breath and wheezing.   Cardiovascular: Negative for chest pain and palpitations.  Gastrointestinal: Positive for abdominal pain and nausea. Negative for constipation, diarrhea and vomiting.  Genitourinary: Negative for dysuria.  Musculoskeletal: Negative for myalgias.  Neurological: Negative for dizziness, focal weakness, seizures and headaches.  Psychiatric/Behavioral: Negative for depression.    DRUG ALLERGIES:   Allergies  Allergen Reactions  . Sulfa Antibiotics Nausea Only    VITALS:  Blood pressure 117/62, pulse 64, temperature 98.1 F (36.7 C), resp. rate 18, height 5\' 4"  (1.626 m), weight 84.8 kg, SpO2 98 %.  PHYSICAL EXAMINATION:  Physical Exam   GENERAL:  73 y.o.-year-old patient lying in the bed with no acute distress.  EYES: Pupils equal, round, reactive to light and accommodation. No scleral icterus. Extraocular muscles intact.  HEENT: Head atraumatic, normocephalic. Oropharynx and nasopharynx clear.  NECK:  Supple, no jugular venous distention. No thyroid enlargement, no tenderness.  LUNGS: Normal breath sounds bilaterally, no wheezing, rales,rhonchi or crepitation. No use of accessory muscles of respiration.  Decreased bibasilar breath sounds CARDIOVASCULAR: S1, S2 normal. No  rubs, or gallops.  2/6 systolic murmur is present  ABDOMEN: Soft, minimal tenderness  in the epigastric region, nondistended. Bowel sounds present. No organomegaly or mass.  EXTREMITIES: No pedal edema, cyanosis, or clubbing.  NEUROLOGIC: Cranial nerves II through XII are intact. Muscle strength 5/5 in all extremities. Sensation intact. Gait not checked.  PSYCHIATRIC: The patient is alert and oriented x 3.  SKIN: No obvious rash, lesion, or ulcer.    LABORATORY PANEL:   CBC Recent Labs  Lab 07/29/18 0458  WBC 18.7*  HGB 13.6  HCT 42.1  PLT 182   ------------------------------------------------------------------------------------------------------------------  Chemistries  Recent Labs  Lab 07/29/18 0458  NA 140  K 3.9  CL 111  CO2 23  GLUCOSE 95  BUN 9  CREATININE 0.66  CALCIUM 8.0*  AST 97*  ALT 200*  ALKPHOS 100  BILITOT 0.8   ------------------------------------------------------------------------------------------------------------------  Cardiac Enzymes No results for input(s): TROPONINI in the last 168 hours. ------------------------------------------------------------------------------------------------------------------  RADIOLOGY:  US Abdomen Complete  Result Date: 07/28/2018 CLINICAL DATA:  Epigastric and right upper quadrant abdominal pain. EXAM: ABDOMEN ULTRASOUND COMPLETE COMPARISON:  None. FINDINGS: Gallbladder: There is gallbladder wall thickening with the gallbladder measuring approximately 6 mm in thickness. There is gallbladder sludge with multiple gallstones. The sonographic Percell Miller sign is reported as negative. There may be some trace pericholecystic free fluid. Common bile duct: Diameter: 0.4 cm Liver: Diffuse increased echogenicity with slightly heterogeneous liver. Appearance typically secondary to fatty infiltration. Fibrosis secondary consideration. No secondary findings of cirrhosis noted. No focal hepatic lesion or intrahepatic biliary duct dilatation. There is a 1.5 cm cyst in the right hepatic lobe. Portal vein is patent on color  Doppler imaging with normal direction of blood flow towards the liver. IVC: No abnormality visualized. Pancreas: Not well evaluated secondary to overlying bowel gas. Spleen: Size  and appearance within normal limits. Right Kidney: Length: 10 cm. Echogenicity within normal limits. No mass or hydronephrosis visualized. Left Kidney: Length: 10 cm. Echogenicity within normal limits. No mass or hydronephrosis visualized. Abdominal aorta: No aneurysm visualized. Other findings: None. IMPRESSION: 1. Cholelithiasis with gallbladder wall thickening and pericholecystic free fluid in the absence of a positive sonographic Percell Miller sign is equivocal for acute cholecystitis. Correlation with laboratory studies and physical exam is recommended. 2. Diffuse increased echogenicity with slightly heterogeneous liver. Appearance typically secondary to fatty infiltration. 3. Pancreas poorly evaluated secondary to overlying bowel gas. These results will be called to the ordering clinician or representative by the Radiologist Assistant, and communication documented in the PACS or zVision Dashboard. Electronically Signed   By: Constance Holster M.D.   On: 07/28/2018 14:42    EKG:   Orders placed or performed in visit on 11/25/17  . EKG 12-Lead    ASSESSMENT AND PLAN:   73 year old female who is very independent history of CLL, hyperlipidemia, osteopenia vitamin D deficiency presents to hospital secondary to abdominal pain and nausea vomiting postprandial.  1.  Acute gallstone pancreatitis-ultrasound abdomen showing cholelithiasis and fatty liver.  There is pericholecystic free fluid. -IV fluids, n.p.o. -LFTs are improving -Advance to clear liquid diet -General surgery consult for possible lap chole this admission prior to discharge. -No CBD dilatation was indicated  2.  Transaminitis-secondary to acute gallstone pancreatitis.  Numbers are improving with fluids.  3.  CLL-has chronic leukocytosis.  Continue to monitor at  this time  4.  DVT prophylaxis-we will start Lovenox  Encourage ambulation   All the records are reviewed and case discussed with Care Management/Social Workerr. Management plans discussed with the patient, family and they are in agreement.  CODE STATUS:  Full Code  TOTAL TIME TAKING CARE OF THIS PATIENT: 38 minutes.   POSSIBLE D/C IN 2 DAYS, DEPENDING ON CLINICAL CONDITION.   Gladstone Lighter M.D on 07/29/2018 at 12:22 PM  Between 7am to 6pm - Pager - [phone]  After 6pm go to [email or web address] - password [email or web address] [facility]  Office  [phone]  CC: Primary care physician; Ria Bush, MD

## 2018-07-30 ENCOUNTER — Encounter (INDEPENDENT_AMBULATORY_CARE_PROVIDER_SITE_OTHER): Payer: Self-pay | Admitting: Physician Assistant

## 2018-07-30 LAB — CBC
HCT: 41.7 % (ref 36.0–46.0)
Hemoglobin: 13.5 g/dL (ref 12.0–15.0)
MCH: 31.1 pg (ref 26.0–34.0)
MCHC: 32.4 g/dL (ref 30.0–36.0)
MCV: 96.1 fL (ref 80.0–100.0)
Platelets: 177 10*3/uL (ref 150–400)
RBC: 4.34 MIL/uL (ref 3.87–5.11)
RDW: 14 % (ref 11.5–15.5)
WBC: 15.6 10*3/uL — ABNORMAL HIGH (ref 4.0–10.5)
nRBC: 0 % (ref 0.0–0.2)

## 2018-07-30 LAB — COMPREHENSIVE METABOLIC PANEL
ALT: 137 U/L — ABNORMAL HIGH (ref 0–44)
AST: 47 U/L — ABNORMAL HIGH (ref 15–41)
Albumin: 3.3 g/dL — ABNORMAL LOW (ref 3.5–5.0)
Alkaline Phosphatase: 93 U/L (ref 38–126)
Anion gap: 7 (ref 5–15)
BUN: 6 mg/dL — ABNORMAL LOW (ref 8–23)
CO2: 23 mmol/L (ref 22–32)
Calcium: 8.2 mg/dL — ABNORMAL LOW (ref 8.9–10.3)
Chloride: 111 mmol/L (ref 98–111)
Creatinine, Ser: 0.56 mg/dL (ref 0.44–1.00)
GFR calc Af Amer: >60 mL/min (ref >60)
GFR calc non Af Amer: >60 mL/min (ref >60)
Glucose, Bld: 104 mg/dL — ABNORMAL HIGH (ref 70–99)
Potassium: 4 mmol/L (ref 3.5–5.1)
Sodium: 141 mmol/L (ref 135–145)
Total Bilirubin: 0.6 mg/dL (ref 0.3–1.2)
Total Protein: 5.4 g/dL — ABNORMAL LOW (ref 6.5–8.1)

## 2018-07-30 LAB — LIPASE, BLOOD: Lipase: 174 U/L — ABNORMAL HIGH (ref 11–51)

## 2018-07-30 MED ORDER — ENSURE ENLIVE PO LIQD
237.0000 mL | Freq: Two times a day (BID) | ORAL | Status: AC
  Administered 2018-07-30: 237 mL via ORAL

## 2018-07-30 MED ORDER — ENOXAPARIN SODIUM 40 MG/0.4ML ~~LOC~~ SOLN
40.0000 mg | SUBCUTANEOUS | Status: AC
  Administered 2018-07-30 – 2018-07-31 (×2): 40 mg via SUBCUTANEOUS
  Filled 2018-07-30 (×2): qty 0.4

## 2018-07-30 NOTE — Progress Notes (Signed)
Updated husband over the phone today and yesterday as well.  Answered all questions

## 2018-07-30 NOTE — Progress Notes (Signed)
[facility] [place] NAME: Lori Shaw    MR#:  [id number]  DATE OF BIRTH:  04-May-1945  SUBJECTIVE:  CHIEF COMPLAINT:   Chief Complaint  Patient presents with  . Abdominal Pain   -Other than weakness, denies any complaints.  Currently on a clear liquid diet.  Lipase is improving. -Plans for laparoscopic cholecystectomy this admission after lipase is normalized  REVIEW OF SYSTEMS:  Review of Systems  Constitutional: Positive for malaise/fatigue. Negative for chills and fever.  HENT: Negative for congestion, ear discharge, hearing loss and nosebleeds.   Eyes: Negative for blurred vision and double vision.  Respiratory: Negative for cough, shortness of breath and wheezing.   Cardiovascular: Negative for chest pain and palpitations.  Gastrointestinal: Negative for abdominal pain, constipation, diarrhea, nausea and vomiting.  Genitourinary: Negative for dysuria.  Musculoskeletal: Negative for myalgias.  Neurological: Negative for dizziness, focal weakness, seizures and headaches.  Psychiatric/Behavioral: Negative for depression.    DRUG ALLERGIES:   Allergies  Allergen Reactions  . Sulfa Antibiotics Nausea Only    VITALS:  Blood pressure 120/61, pulse (!) 55, temperature 97.8 F (36.6 C), temperature source Oral, resp. rate 17, height 5\' 4"  (1.626 m), weight 84.8 kg, SpO2 98 %.  PHYSICAL EXAMINATION:  Physical Exam   GENERAL:  73 y.o.-year-old patient lying in the bed with no acute distress.  EYES: Pupils equal, round, reactive to light and accommodation. No scleral icterus. Extraocular muscles intact.  HEENT: Head atraumatic, normocephalic. Oropharynx and nasopharynx clear.  NECK:  Supple, no jugular venous distention. No thyroid enlargement, no tenderness.  LUNGS: Normal breath sounds bilaterally, no wheezing, rales,rhonchi or crepitation. No use of accessory muscles of respiration.  Decreased bibasilar breath sounds  CARDIOVASCULAR: S1, S2 normal. No  rubs, or gallops.  2/6 systolic murmur is present  ABDOMEN: Soft, nontender, nondistended. Bowel sounds present. No organomegaly or mass.  EXTREMITIES: No pedal edema, cyanosis, or clubbing.  NEUROLOGIC: Cranial nerves II through XII are intact. Muscle strength 5/5 in all extremities. Sensation intact. Gait not checked.  PSYCHIATRIC: The patient is alert and oriented x 3.  SKIN: No obvious rash, lesion, or ulcer.    LABORATORY PANEL:   CBC Recent Labs  Lab 07/30/18 0504  WBC 15.6*  HGB 13.5  HCT 41.7  PLT 177   ------------------------------------------------------------------------------------------------------------------  Chemistries  Recent Labs  Lab 07/30/18 0504  NA 141  K 4.0  CL 111  CO2 23  GLUCOSE 104*  BUN 6*  CREATININE 0.56  CALCIUM 8.2*  AST 47*  ALT 137*  ALKPHOS 93  BILITOT 0.6   ------------------------------------------------------------------------------------------------------------------  Cardiac Enzymes No results for input(s): TROPONINI in the last 168 hours. ------------------------------------------------------------------------------------------------------------------  RADIOLOGY:  US Abdomen Complete  Result Date: 07/28/2018 CLINICAL DATA:  Epigastric and right upper quadrant abdominal pain. EXAM: ABDOMEN ULTRASOUND COMPLETE COMPARISON:  None. FINDINGS: Gallbladder: There is gallbladder wall thickening with the gallbladder measuring approximately 6 mm in thickness. There is gallbladder sludge with multiple gallstones. The sonographic Percell Miller sign is reported as negative. There may be some trace pericholecystic free fluid. Common bile duct: Diameter: 0.4 cm Liver: Diffuse increased echogenicity with slightly heterogeneous liver. Appearance typically secondary to fatty infiltration. Fibrosis secondary consideration. No secondary findings of cirrhosis noted. No focal hepatic lesion or intrahepatic biliary duct  dilatation. There is a 1.5 cm cyst in the right hepatic lobe. Portal vein is patent on color Doppler imaging with normal direction of blood flow towards the liver. IVC: No abnormality  visualized. Pancreas: Not well evaluated secondary to overlying bowel gas. Spleen: Size and appearance within normal limits. Right Kidney: Length: 10 cm. Echogenicity within normal limits. No mass or hydronephrosis visualized. Left Kidney: Length: 10 cm. Echogenicity within normal limits. No mass or hydronephrosis visualized. Abdominal aorta: No aneurysm visualized. Other findings: None. IMPRESSION: 1. Cholelithiasis with gallbladder wall thickening and pericholecystic free fluid in the absence of a positive sonographic Percell Miller sign is equivocal for acute cholecystitis. Correlation with laboratory studies and physical exam is recommended. 2. Diffuse increased echogenicity with slightly heterogeneous liver. Appearance typically secondary to fatty infiltration. 3. Pancreas poorly evaluated secondary to overlying bowel gas. These results will be called to the ordering clinician or representative by the Radiologist Assistant, and communication documented in the PACS or zVision Dashboard. Electronically Signed   By: Constance Holster M.D.   On: 07/28/2018 14:42    EKG:   Orders placed or performed in visit on 11/25/17  . EKG 12-Lead    ASSESSMENT AND PLAN:   73 year old female who is very independent history of CLL, hyperlipidemia, osteopenia vitamin D deficiency presents to hospital secondary to abdominal pain and nausea vomiting postprandial.  1.  Acute gallstone pancreatitis-ultrasound abdomen showing cholelithiasis and fatty liver.  There is pericholecystic free fluid. -Improved symptoms.  Continue IV fluids.  Currently tolerating clear liquid diet.  Lipase is much improving -Appreciate general surgery consult.  Will need laparoscopic cholecystectomy prior to discharge.  Waiting for lipase to be much improved. -No CBD  dilatation was indicated  2.  Transaminitis-secondary to acute gallstone pancreatitis.  Numbers are improving,  3.  CLL-has chronic leukocytosis.  Differential with lymphocytosis recently.  Continue to monitor at this time  4.  DVT prophylaxis-on Lovenox  Encourage ambulation.  Patient is independent at baseline. -Updated husband over the phone yesterday   All the records are reviewed and case discussed with Care Management/Social Workerr. Management plans discussed with the patient, family and they are in agreement.  CODE STATUS:  Full Code  TOTAL TIME TAKING CARE OF THIS PATIENT: 37 minutes.   POSSIBLE D/C IN 2 DAYS, DEPENDING ON CLINICAL CONDITION.   Gladstone Lighter M.D on 07/30/2018 at 9:27 AM  Between 7am to 6pm - Pager - [phone]  After 6pm go to [email or web address] - password [email or web address] [facility]  Office  [phone]  CC: Primary care physician; Ria Bush, MD

## 2018-07-30 NOTE — Progress Notes (Signed)
Subjective:  CC: Lori Shaw is a 73 y.o. female  Hospital stay day 2,   gallstone pancreatitis  HPI: No issues overnight.  Tolerating clears  ROS:  General: Denies weight loss, weight gain, fatigue, fevers, chills, and night sweats. Heart: Denies chest pain, palpitations, racing heart, irregular heartbeat, leg pain or swelling, and decreased activity tolerance. Respiratory: Denies breathing difficulty, shortness of breath, wheezing, cough, and sputum. GI: Denies change in appetite, heartburn, nausea, vomiting, constipation, diarrhea, and blood in stool. GU: Denies difficulty urinating, pain with urinating, urgency, frequency, blood in urine.   Objective:   Temp:  [97.6 F (36.4 C)-98.4 F (36.9 C)] 97.8 F (36.6 C) (07/04 0720) Pulse Rate:  [55] 55 (07/04 0720) Resp:  [16-18] 17 (07/04 0720) BP: (102-120)/(57-63) 120/61 (07/04 0720) SpO2:  [98 %-99 %] 98 % (07/04 0720)     Height: 5\' 4"  (162.6 cm) Weight: 84.8 kg BMI (Calculated): 32.08   Intake/Output this shift:   Intake/Output Summary (Last 24 hours) at 07/30/2018 1248 Last data filed at 07/30/2018 0900 Gross per 24 hour  Intake 3848.61 ml  Output 1100 ml  Net 2748.61 ml    Constitutional :  alert, cooperative, appears stated age and no distress  Respiratory:  clear to auscultation bilaterally  Cardiovascular:  regular rate and rhythm  Gastrointestinal: soft, non-tender; bowel sounds normal; no masses,  no organomegaly.   Skin: Cool and moist.   Psychiatric: Normal affect, non-agitated, not confused       LABS:  CMP Latest Ref Rng & Units 07/30/2018 07/29/2018 07/28/2018  Glucose 70 - 99 mg/dL 104(H) 95 89  BUN 8 - 23 mg/dL 6(L) 9 11  Creatinine 0.44 - 1.00 mg/dL 0.56 0.66 0.78  Sodium 135 - 145 mmol/L 141 140 141  Potassium 3.5 - 5.1 mmol/L 4.0 3.9 4.2  Chloride 98 - 111 mmol/L 111 111 104  CO2 22 - 32 mmol/L 23 23 26   Calcium 8.9 - 10.3 mg/dL 8.2(L) 8.0(L) 9.1  Total Protein 6.5 - 8.1 g/dL 5.4(L) 5.5(L)  6.5  Total Bilirubin 0.3 - 1.2 mg/dL 0.6 0.8 1.0  Alkaline Phos 38 - 126 U/L 93 100 150(H)  AST 15 - 41 U/L 47(H) 97(H) 219(H)  ALT 0 - 44 U/L 137(H) 200(H) 319(H)   CBC Latest Ref Rng & Units 07/30/2018 07/29/2018 07/28/2018  WBC 4.0 - 10.5 K/uL 15.6(H) 18.7(H) 18.7 Repeated and verified X2.(HH)  Hemoglobin 12.0 - 15.0 g/dL 13.5 13.6 14.7  Hematocrit 36.0 - 46.0 % 41.7 42.1 44.7  Platelets 150 - 400 K/uL 177 182 188.0    RADS: n/a Assessment:   Gallstone pancreatitis.  Doing well. Labs continue to downtrend.  Continue with clear liquid diet for now.  We will add nutritional supplements in the meantime.  Surgery still scheduled for Monday or Tuesday with Dr. Celine Ahr.  All questions answered with patient.

## 2018-07-30 NOTE — Plan of Care (Signed)
?  Problem: Clinical Measurements: ?Goal: Ability to maintain clinical measurements within normal limits will improve ?Outcome: Progressing ?Goal: Will remain free from infection ?Outcome: Progressing ?Goal: Diagnostic test results will improve ?Outcome: Progressing ?  ?

## 2018-07-31 LAB — COMPREHENSIVE METABOLIC PANEL
ALT: 103 U/L — ABNORMAL HIGH (ref 0–44)
AST: 29 U/L (ref 15–41)
Albumin: 3.5 g/dL (ref 3.5–5.0)
Alkaline Phosphatase: 102 U/L (ref 38–126)
Anion gap: 6 (ref 5–15)
BUN: <5 mg/dL — ABNORMAL LOW (ref 8–23)
CO2: 24 mmol/L (ref 22–32)
Calcium: 8.5 mg/dL — ABNORMAL LOW (ref 8.9–10.3)
Chloride: 112 mmol/L — ABNORMAL HIGH (ref 98–111)
Creatinine, Ser: 0.74 mg/dL (ref 0.44–1.00)
GFR calc Af Amer: >60 mL/min (ref >60)
GFR calc non Af Amer: >60 mL/min (ref >60)
Glucose, Bld: 110 mg/dL — ABNORMAL HIGH (ref 70–99)
Potassium: 3.9 mmol/L (ref 3.5–5.1)
Sodium: 142 mmol/L (ref 135–145)
Total Bilirubin: 0.5 mg/dL (ref 0.3–1.2)
Total Protein: 5.9 g/dL — ABNORMAL LOW (ref 6.5–8.1)

## 2018-07-31 LAB — LIPASE, BLOOD: Lipase: 144 U/L — ABNORMAL HIGH (ref 11–51)

## 2018-07-31 NOTE — Progress Notes (Signed)
[facility] [place] NAME: Nataly Pacifico    MR#:  [id number]  DATE OF BIRTH:  12-24-1945  SUBJECTIVE:  CHIEF COMPLAINT:   Chief Complaint  Patient presents with  . Abdominal Pain   -Complains of some aches and stiffness of both her legs.  Lipase is improving.  Patient is asymptomatic and tolerating clear liquids well  REVIEW OF SYSTEMS:  Review of Systems  Constitutional: Positive for malaise/fatigue. Negative for chills and fever.  HENT: Negative for congestion, ear discharge, hearing loss and nosebleeds.   Eyes: Negative for blurred vision and double vision.  Respiratory: Negative for cough, shortness of breath and wheezing.   Cardiovascular: Negative for chest pain and palpitations.  Gastrointestinal: Negative for abdominal pain, constipation, diarrhea, nausea and vomiting.  Genitourinary: Negative for dysuria.  Musculoskeletal: Negative for myalgias.  Neurological: Negative for dizziness, focal weakness, seizures and headaches.  Psychiatric/Behavioral: Negative for depression.    DRUG ALLERGIES:   Allergies  Allergen Reactions  . Sulfa Antibiotics Nausea Only    VITALS:  Blood pressure (!) 146/69, pulse (!) 54, temperature 97.9 F (36.6 C), temperature source Oral, resp. rate 18, height 5\' 4"  (1.626 m), weight 84.8 kg, SpO2 98 %.  PHYSICAL EXAMINATION:  Physical Exam   GENERAL:  73 y.o.-year-old patient lying in the bed with no acute distress.  EYES: Pupils equal, round, reactive to light and accommodation. No scleral icterus. Extraocular muscles intact.  HEENT: Head atraumatic, normocephalic. Oropharynx and nasopharynx clear.  NECK:  Supple, no jugular venous distention. No thyroid enlargement, no tenderness.  LUNGS: Normal breath sounds bilaterally, no wheezing, rales,rhonchi or crepitation. No use of accessory muscles of respiration.  Decreased bibasilar breath sounds CARDIOVASCULAR: S1, S2 normal. No  rubs, or  gallops.  2/6 systolic murmur is present  ABDOMEN: Soft, nontender, nondistended. Bowel sounds present. No organomegaly or mass.  EXTREMITIES: No pedal edema, cyanosis, or clubbing.  NEUROLOGIC: Cranial nerves II through XII are intact. Muscle strength 5/5 in all extremities. Sensation intact. Gait not checked.  PSYCHIATRIC: The patient is alert and oriented x 3.  SKIN: No obvious rash, lesion, or ulcer.    LABORATORY PANEL:   CBC Recent Labs  Lab 07/30/18 0504  WBC 15.6*  HGB 13.5  HCT 41.7  PLT 177   ------------------------------------------------------------------------------------------------------------------  Chemistries  Recent Labs  Lab 07/31/18 0617  NA 142  K 3.9  CL 112*  CO2 24  GLUCOSE 110*  BUN <5*  CREATININE 0.74  CALCIUM 8.5*  AST 29  ALT 103*  ALKPHOS 102  BILITOT 0.5   ------------------------------------------------------------------------------------------------------------------  Cardiac Enzymes No results for input(s): TROPONINI in the last 168 hours. ------------------------------------------------------------------------------------------------------------------  RADIOLOGY:  No results found.  EKG:   Orders placed or performed in visit on 11/25/17  . EKG 12-Lead    ASSESSMENT AND PLAN:   73 year old female who is very independent history of CLL, hyperlipidemia, osteopenia vitamin D deficiency presents to hospital secondary to abdominal pain and nausea vomiting postprandial.  1.  Acute gallstone pancreatitis-ultrasound abdomen showing cholelithiasis and fatty liver.  There is pericholecystic free fluid. -Improved symptoms.  On IV fluids.  Currently tolerating clear liquid diet.  Lipase is improving-sometimes lipase does lag behind, clinically her pancreatitis seem to be resolved. -Appreciate general surgery consult.  Will need laparoscopic cholecystectomy prior to discharge.   -Likely tomorrow or Monday -No CBD dilatation was  indicated  2.  Transaminitis-secondary to acute gallstone pancreatitis.  Numbers are improving,  3.  CLL-has chronic leukocytosis.  Differential with lymphocytosis recently.  Continue to monitor at this time  4.  DVT prophylaxis-on Lovenox  5.  Obstructive sleep apnea-uses CPAP at bedtime  Encourage ambulation.  Patient is independent at baseline.    All the records are reviewed and case discussed with Care Management/Social Workerr. Management plans discussed with the patient, family and they are in agreement.  CODE STATUS:  Full Code  TOTAL TIME TAKING CARE OF THIS PATIENT: 37 minutes.   POSSIBLE D/C IN 1-2 DAYS, DEPENDING ON CLINICAL CONDITION.   Gladstone Lighter M.D on 07/31/2018 at 10:09 AM  Between 7am to 6pm - Pager - [phone]  After 6pm go to [email or web address] - password [email or web address] [facility]  Office  [phone]  CC: Primary care physician; Ria Bush, MD

## 2018-07-31 NOTE — Plan of Care (Signed)
  Problem: Education: Goal: Knowledge of General Education information will improve Description: Including pain rating scale, medication(s)/side effects and non-pharmacologic comfort measures Outcome: Progressing   Problem: Health Behavior/Discharge Planning: Goal: Ability to manage health-related needs will improve Outcome: Progressing   Problem: Clinical Measurements: Goal: Ability to maintain clinical measurements within normal limits will improve Outcome: Progressing Goal: Will remain free from infection Outcome: Progressing Goal: Diagnostic test results will improve Outcome: Progressing   Problem: Nutrition: Goal: Adequate nutrition will be maintained Outcome: Progressing   Problem: Activity: Goal: Risk for activity intolerance will decrease Outcome: Progressing   Problem: Pain Managment: Goal: General experience of comfort will improve Outcome: Progressing

## 2018-08-01 ENCOUNTER — Inpatient Hospital Stay: Payer: Medicare HMO | Admitting: Anesthesiology

## 2018-08-01 ENCOUNTER — Encounter
Admission: EM | Disposition: A | Discharge status: Home / Self Care | Payer: Self-pay | Source: Home / Self Care | Attending: Internal Medicine

## 2018-08-01 ENCOUNTER — Encounter: Payer: Self-pay | Admitting: Anesthesiology

## 2018-08-01 HISTORY — PX: CHOLECYSTECTOMY: SHX55

## 2018-08-01 LAB — LIPASE, BLOOD: Lipase: 117 U/L — ABNORMAL HIGH (ref 11–51)

## 2018-08-01 LAB — COMPREHENSIVE METABOLIC PANEL
ALT: 81 U/L — ABNORMAL HIGH (ref 0–44)
AST: 24 U/L (ref 15–41)
Albumin: 3.5 g/dL (ref 3.5–5.0)
Alkaline Phosphatase: 95 U/L (ref 38–126)
Anion gap: 7 (ref 5–15)
BUN: <5 mg/dL — ABNORMAL LOW (ref 8–23)
CO2: 27 mmol/L (ref 22–32)
Calcium: 8.8 mg/dL — ABNORMAL LOW (ref 8.9–10.3)
Chloride: 108 mmol/L (ref 98–111)
Creatinine, Ser: 0.75 mg/dL (ref 0.44–1.00)
GFR calc Af Amer: >60 mL/min (ref >60)
GFR calc non Af Amer: >60 mL/min (ref >60)
Glucose, Bld: 108 mg/dL — ABNORMAL HIGH (ref 70–99)
Potassium: 3.8 mmol/L (ref 3.5–5.1)
Sodium: 142 mmol/L (ref 135–145)
Total Bilirubin: 0.3 mg/dL (ref 0.3–1.2)
Total Protein: 5.8 g/dL — ABNORMAL LOW (ref 6.5–8.1)

## 2018-08-01 SURGERY — LAPAROSCOPIC CHOLECYSTECTOMY
Anesthesia: General

## 2018-08-01 MED ORDER — ONDANSETRON HCL 4 MG/2ML IJ SOLN: 4.0000 mg | Freq: Once | INTRAMUSCULAR | Status: DC | PRN

## 2018-08-01 MED ORDER — PROPOFOL 10 MG/ML IV BOLUS
INTRAVENOUS | Status: DC | PRN
  Administered 2018-08-01: 150 mg via INTRAVENOUS

## 2018-08-01 MED ORDER — LIDOCAINE-EPINEPHRINE 1 %-1:100000 IJ SOLN
INTRAMUSCULAR | Status: DC | PRN
  Administered 2018-08-01: 25 mL via SUBCUTANEOUS

## 2018-08-01 MED ORDER — ONDANSETRON HCL 4 MG/2ML IJ SOLN
INTRAMUSCULAR | Status: DC | PRN
  Administered 2018-08-01: 4 mg via INTRAVENOUS

## 2018-08-01 MED ORDER — CEFAZOLIN SODIUM-DEXTROSE 2-4 GM/100ML-% IV SOLN
2.0000 g | INTRAVENOUS | Status: AC
  Administered 2018-08-01: 2 g via INTRAVENOUS
  Filled 2018-08-01: qty 100

## 2018-08-01 MED ORDER — FENTANYL CITRATE (PF) 100 MCG/2ML IJ SOLN
INTRAMUSCULAR | Status: AC
  Filled 2018-08-01: qty 2

## 2018-08-01 MED ORDER — SUGAMMADEX SODIUM 200 MG/2ML IV SOLN
INTRAVENOUS | Status: AC
  Filled 2018-08-01: qty 2

## 2018-08-01 MED ORDER — FENTANYL CITRATE (PF) 100 MCG/2ML IJ SOLN
25.0000 ug | INTRAMUSCULAR | Status: DC | PRN
  Administered 2018-08-01: 25 ug via INTRAVENOUS

## 2018-08-01 MED ORDER — ONDANSETRON HCL 4 MG/2ML IJ SOLN
INTRAMUSCULAR | Status: AC
  Filled 2018-08-01: qty 2

## 2018-08-01 MED ORDER — CEFAZOLIN SODIUM-DEXTROSE 1-4 GM/50ML-% IV SOLN
1.0000 g | INTRAVENOUS | Status: DC
  Filled 2018-08-01: qty 50

## 2018-08-01 MED ORDER — LIDOCAINE HCL (PF) 2 % IJ SOLN
INTRAMUSCULAR | Status: AC
  Filled 2018-08-01: qty 10

## 2018-08-01 MED ORDER — MIDAZOLAM HCL 2 MG/2ML IJ SOLN
INTRAMUSCULAR | Status: DC | PRN
  Administered 2018-08-01: 2 mg via INTRAVENOUS

## 2018-08-01 MED ORDER — DEXAMETHASONE SODIUM PHOSPHATE 10 MG/ML IJ SOLN
INTRAMUSCULAR | Status: DC | PRN
  Administered 2018-08-01: 5 mg via INTRAVENOUS

## 2018-08-01 MED ORDER — ROCURONIUM BROMIDE 100 MG/10ML IV SOLN
INTRAVENOUS | Status: DC | PRN
  Administered 2018-08-01: 40 mg via INTRAVENOUS
  Administered 2018-08-01: 10 mg via INTRAVENOUS

## 2018-08-01 MED ORDER — LIDOCAINE HCL (CARDIAC) PF 100 MG/5ML IV SOSY
PREFILLED_SYRINGE | INTRAVENOUS | Status: DC | PRN
  Administered 2018-08-01: 80 mg via INTRAVENOUS

## 2018-08-01 MED ORDER — LACTATED RINGERS IV SOLN
INTRAVENOUS | Status: DC | PRN
  Administered 2018-08-01: 17:00:00 via INTRAVENOUS

## 2018-08-01 MED ORDER — ROCURONIUM BROMIDE 100 MG/10ML IV SOLN
INTRAVENOUS | Status: AC
  Filled 2018-08-01: qty 1

## 2018-08-01 MED ORDER — ACETAMINOPHEN 10 MG/ML IV SOLN
INTRAVENOUS | Status: AC
  Filled 2018-08-01: qty 100

## 2018-08-01 MED ORDER — FENTANYL CITRATE (PF) 100 MCG/2ML IJ SOLN
INTRAMUSCULAR | Status: DC | PRN
  Administered 2018-08-01 (×4): 50 ug via INTRAVENOUS

## 2018-08-01 MED ORDER — PROPOFOL 10 MG/ML IV BOLUS
INTRAVENOUS | Status: AC
  Filled 2018-08-01: qty 20

## 2018-08-01 MED ORDER — MIDAZOLAM HCL 2 MG/2ML IJ SOLN
INTRAMUSCULAR | Status: AC
  Filled 2018-08-01: qty 2

## 2018-08-01 MED ORDER — EPHEDRINE SULFATE 50 MG/ML IJ SOLN
INTRAMUSCULAR | Status: DC | PRN
  Administered 2018-08-01 (×3): 10 mg via INTRAVENOUS

## 2018-08-01 MED ORDER — SEVOFLURANE IN SOLN
RESPIRATORY_TRACT | Status: AC
  Filled 2018-08-01: qty 250

## 2018-08-01 MED ORDER — FENTANYL CITRATE (PF) 100 MCG/2ML IJ SOLN
INTRAMUSCULAR | Status: AC
  Administered 2018-08-01: 25 ug via INTRAVENOUS
  Filled 2018-08-01: qty 2

## 2018-08-01 MED ORDER — DEXAMETHASONE SODIUM PHOSPHATE 10 MG/ML IJ SOLN
INTRAMUSCULAR | Status: AC
  Filled 2018-08-01: qty 1

## 2018-08-01 MED ORDER — SUGAMMADEX SODIUM 200 MG/2ML IV SOLN
INTRAVENOUS | Status: DC | PRN
  Administered 2018-08-01: 200 mg via INTRAVENOUS

## 2018-08-01 MED ORDER — ACETAMINOPHEN 10 MG/ML IV SOLN
INTRAVENOUS | Status: DC | PRN
  Administered 2018-08-01: 1000 mg via INTRAVENOUS

## 2018-08-01 SURGICAL SUPPLY — 45 items
"PENCIL ELECTRO HAND CTR " (MISCELLANEOUS) ×1 IMPLANT
APPLIER CLIP 5 13 M/L LIGAMAX5 (MISCELLANEOUS) ×2
BLADE SURG SZ11 CARB STEEL (BLADE) ×2 IMPLANT
CANISTER SUCT 1200ML W/VALVE (MISCELLANEOUS) ×2 IMPLANT
CHLORAPREP W/TINT 26 (MISCELLANEOUS) ×2 IMPLANT
CLIP APPLIE 5 13 M/L LIGAMAX5 (MISCELLANEOUS) ×1 IMPLANT
COVER WAND RF STERILE (DRAPES) ×2 IMPLANT
DECANTER SPIKE VIAL GLASS SM (MISCELLANEOUS) ×2 IMPLANT
DEFOGGER SCOPE WARMER CLEARIFY (MISCELLANEOUS) ×2 IMPLANT
DERMABOND ADVANCED (GAUZE/BANDAGES/DRESSINGS) ×1
DERMABOND ADVANCED .7 DNX12 (GAUZE/BANDAGES/DRESSINGS) ×1 IMPLANT
ELECT CAUTERY BLADE TIP 2.5 (TIP) ×2
ELECT REM PT RETURN 9FT ADLT (ELECTROSURGICAL) ×2
ELECTRODE CAUTERY BLDE TIP 2.5 (TIP) ×1 IMPLANT
ELECTRODE REM PT RTRN 9FT ADLT (ELECTROSURGICAL) ×1 IMPLANT
GLOVE BIO SURGEON STRL SZ 6.5 (GLOVE) ×2 IMPLANT
GLOVE INDICATOR 7.0 STRL GRN (GLOVE) ×4 IMPLANT
GOWN STRL REUS W/ TWL LRG LVL3 (GOWN DISPOSABLE) ×3 IMPLANT
GOWN STRL REUS W/TWL LRG LVL3 (GOWN DISPOSABLE) ×3
GRASPER SUT TROCAR 14GX15 (MISCELLANEOUS) IMPLANT
IRRIGATION STRYKERFLOW (MISCELLANEOUS) ×1 IMPLANT
IRRIGATOR STRYKERFLOW (MISCELLANEOUS) ×2
IV NS 1000ML (IV SOLUTION) ×1
IV NS 1000ML BAXH (IV SOLUTION) ×1 IMPLANT
KIT TURNOVER KIT A (KITS) ×2 IMPLANT
LABEL OR SOLS (LABEL) ×2 IMPLANT
NEEDLE HYPO 22GX1.5 SAFETY (NEEDLE) ×2 IMPLANT
NS IRRIG 500ML POUR BTL (IV SOLUTION) ×2 IMPLANT
PACK LAP CHOLECYSTECTOMY (MISCELLANEOUS) ×2 IMPLANT
PENCIL ELECTRO HAND CTR (MISCELLANEOUS) ×2 IMPLANT
POUCH SPECIMEN RETRIEVAL 10MM (ENDOMECHANICALS) ×2 IMPLANT
SCISSORS METZENBAUM CVD 33 (INSTRUMENTS) ×2 IMPLANT
SET TUBE SMOKE EVAC HIGH FLOW (TUBING) ×2 IMPLANT
SLEEVE ADV FIXATION 5X100MM (TROCAR) ×4 IMPLANT
SOLUTION ELECTROLUBE (MISCELLANEOUS) ×1 IMPLANT
STRIP CLOSURE SKIN 1/2X4 (GAUZE/BANDAGES/DRESSINGS) ×2 IMPLANT
SUT MNCRL 4-0 (SUTURE) ×1
SUT MNCRL 4-0 27XMFL (SUTURE) ×1
SUT VIC AB 3-0 SH 27 (SUTURE) ×1
SUT VIC AB 3-0 SH 27X BRD (SUTURE) ×1 IMPLANT
SUT VICRYL 0 AB UR-6 (SUTURE) ×4 IMPLANT
SUTURE MNCRL 4-0 27XMF (SUTURE) ×1 IMPLANT
TROCAR ADV FIXATION 12X100MM (TROCAR) ×2 IMPLANT
TROCAR Z-THREAD OPTICAL 5X100M (TROCAR) ×2 IMPLANT
WATER STERILE IRR 1000ML POUR (IV SOLUTION) ×2 IMPLANT

## 2018-08-01 NOTE — Plan of Care (Signed)
  Problem: Education: Goal: Knowledge of General Education information will improve Description: Including pain rating scale, medication(s)/side effects and non-pharmacologic comfort measures Outcome: Progressing   Problem: Health Behavior/Discharge Planning: Goal: Ability to manage health-related needs will improve Outcome: Progressing   Problem: Clinical Measurements: Goal: Ability to maintain clinical measurements within normal limits will improve Outcome: Progressing Goal: Will remain free from infection Outcome: Progressing Goal: Diagnostic test results will improve Outcome: Progressing   Problem: Activity: Goal: Risk for activity intolerance will decrease Outcome: Progressing   Problem: Nutrition: Goal: Adequate nutrition will be maintained Outcome: Progressing   Problem: Pain Managment: Goal: General experience of comfort will improve Outcome: Progressing   

## 2018-08-01 NOTE — Progress Notes (Signed)
[facility] at [place] NAME: Lori Shaw    MR#:  [id number]  DATE OF BIRTH:  January 20, 1946  SUBJECTIVE:   Patient plan to go to the OR today.  Denies abdominal pain, nausea or vomiting.  REVIEW OF SYSTEMS:     Review of Systems  Constitutional: Negative for fever, chills weight loss HENT: Negative for ear pain, nosebleeds, congestion, facial swelling, rhinorrhea, neck pain, neck stiffness and ear discharge.   Respiratory: Negative for cough, shortness of breath, wheezing  Cardiovascular: Negative for chest pain, palpitations and leg swelling.  Gastrointestinal: Negative for heartburn, abdominal pain, vomiting, diarrhea or consitpation Genitourinary: Negative for dysuria, urgency, frequency, hematuria Musculoskeletal: Negative for back pain or joint pain Neurological: Negative for dizziness, seizures, syncope, focal weakness,  numbness and headaches.  Hematological: Does not bruise/bleed easily.  Psychiatric/Behavioral: Negative for hallucinations, confusion, dysphoric mood    Tolerating Diet:npo      DRUG ALLERGIES:   Allergies  Allergen Reactions  . Sulfa Antibiotics Nausea Only    VITALS:  Blood pressure (!) 154/75, pulse (!) 50, temperature 97.9 F (36.6 C), temperature source Oral, resp. rate 17, height 5\' 4"  (1.626 m), weight 84.8 kg, SpO2 95 %.  PHYSICAL EXAMINATION:  Constitutional: Appears well-developed and well-nourished. No distress. HENT: Normocephalic. Marland Kitchen Oropharynx is clear and moist.  Eyes: Conjunctivae and EOM are normal. PERRLA, no scleral icterus.  Neck: Normal ROM. Neck supple. No JVD. No tracheal deviation. CVS: RRR, S1/S2 +, no murmurs, no gallops, no carotid bruit.  Pulmonary: Effort and breath sounds normal, no stridor, rhonchi, wheezes, rales.  Abdominal: Soft. BS +,  no distension, tenderness, rebound or guarding.  Musculoskeletal: Normal range of motion. No edema and no tenderness.  Neuro: Alert.  CN 2-12 grossly intact. No focal deficits. Skin: Skin is warm and dry. No rash noted. Psychiatric: Normal mood and affect.      LABORATORY PANEL:   CBC Recent Labs  Lab 07/30/18 0504  WBC 15.6*  HGB 13.5  HCT 41.7  PLT 177   ------------------------------------------------------------------------------------------------------------------  Chemistries  Recent Labs  Lab 08/01/18 0414  NA 142  K 3.8  CL 108  CO2 27  GLUCOSE 108*  BUN <5*  CREATININE 0.75  CALCIUM 8.8*  AST 24  ALT 81*  ALKPHOS 95  BILITOT 0.3   ------------------------------------------------------------------------------------------------------------------  Cardiac Enzymes No results for input(s): TROPONINI in the last 168 hours. ------------------------------------------------------------------------------------------------------------------  RADIOLOGY:  No results found.   ASSESSMENT AND PLAN:   72 year old female with history of CLL who presented to the hospital due to abdominal pain and nausea.  1.  Acute gallstone pancreatitis:  Patient plan for laparoscopic cholecystectomy this afternoon.  2.  Elevated LFTs in the setting of problem #1  3.  CLL: Patient can follow-up with her outpatient oncologist.  4.  OSA: Continue CPAP  Patient will be transferred to surgical services after her laparoscopic cholecystectomy today. Discussed with surgery. If surgery goes well then plan will be discharged tomorrow. At the time of discharge patient will continue her outpatient medications with outpatient follow-up with her PCP.  I will sign off.  Please feel free to call me if you have any questions  Management plans discussed with the patient and she is in agreement.  CODE STATUS: full  TOTAL TIME TAKING CARE OF THIS PATIENT: 25 minutes.     POSSIBLE D/C tomorrow, DEPENDING ON CLINICAL CONDITION.   [name] M.D on 08/01/2018 at 11:28 AM  Between 7am to 6pm -  Pager -  [phone] After 6pm go to [email or web address] - password [email or web address] [facility]  Office  [phone]  CC: Primary care physician; Ria Bush, MD  Note: This dictation was prepared with Dragon dictation along with smaller phrase technology. Any transcriptional errors that result from this process are unintentional.

## 2018-08-01 NOTE — Interval H&P Note (Signed)
History and Physical Interval Note:  08/01/2018 4:49 PM  [place]  has presented today for surgery, with the diagnosis of Gallstone Pancreatitis.  The various methods of treatment have been discussed with the patient and family. After consideration of risks, benefits and other options for treatment, the patient has consented to  Procedure(s): LAPAROSCOPIC CHOLECYSTECTOMY (N/A) as a surgical intervention.  The patient's history has been reviewed, patient examined, no change in status, stable for surgery.  I have reviewed the patient's chart and labs.  Questions were answered to the patient's satisfaction.     Fredirick Maudlin

## 2018-08-01 NOTE — Anesthesia Preprocedure Evaluation (Signed)
Anesthesia Evaluation  Patient identified by MRN, date of birth, ID band Patient awake    Reviewed: Allergy & Precautions, H&P , NPO status , Patient's Chart, lab work & pertinent test results, reviewed documented beta blocker date and time   History of Anesthesia Complications Negative for: history of anesthetic complications  Airway Mallampati: III  TM Distance: >3 FB Neck ROM: full    Dental  (+) Dental Advidsory Given, Partial Upper, Partial Lower, Missing   Pulmonary neg shortness of breath, sleep apnea and Continuous Positive Airway Pressure Ventilation , neg COPD, neg recent URI, former smoker,           Cardiovascular Exercise Tolerance: Good negative cardio ROS       Neuro/Psych negative neurological ROS  negative psych ROS   GI/Hepatic negative GI ROS, Neg liver ROS,   Endo/Other  negative endocrine ROS  Renal/GU negative Renal ROS  negative genitourinary   Musculoskeletal   Abdominal   Peds  Hematology negative hematology ROS (+)   Anesthesia Other Findings Past Medical History: No date: B12 deficiency No date: Back pain 06/21/2016: CLL (chronic lymphocytic leukemia) ([place])     Comment:  Rai stage 0, dx 06/2016 (Dr Janese Banks) No date: HLD (hyperlipidemia) No date: Joint pain No date: Knee problem 2006, 04/2014: OSA on CPAP     Comment:  AHI of 23.8 and an RDI of 42.9, on CPAP 9 cm H2O               ([place]) 08/2010: Osteopenia     Comment:  T -1.4 at L femur neck 12/14/2014: Piriformis syndrome No date: Vitamin D deficiency   Reproductive/Obstetrics negative OB ROS                             Anesthesia Physical Anesthesia Plan  ASA: II  Anesthesia Plan: General   Post-op Pain Management:    Induction: Intravenous  PONV Risk Score and Plan: 3 and Ondansetron, Dexamethasone and Treatment may vary due to age or medical condition  Airway Management Planned: Oral  ETT  Additional Equipment:   Intra-op Plan:   Post-operative Plan: Extubation in OR  Informed Consent: I have reviewed the patients History and Physical, chart, labs and discussed the procedure including the risks, benefits and alternatives for the proposed anesthesia with the patient or authorized representative who has indicated his/her understanding and acceptance.     Dental Advisory Given  Plan Discussed with: Anesthesiologist, CRNA and Surgeon  Anesthesia Plan Comments:         Anesthesia Quick Evaluation

## 2018-08-01 NOTE — Anesthesia Procedure Notes (Signed)
Procedure Name: Intubation Date/Time: 08/01/2018 5:18 PM Performed by: Caryl Asp, CRNA Pre-anesthesia Checklist: Patient identified, Patient being monitored, Timeout performed, Emergency Drugs available and Suction available Patient Re-evaluated:Patient Re-evaluated prior to induction Oxygen Delivery Method: Circle system utilized Preoxygenation: Pre-oxygenation with 100% oxygen Induction Type: IV induction Ventilation: Mask ventilation without difficulty Laryngoscope Size: 3 and McGraph Grade View: Grade I Tube type: Oral Tube size: 7.0 mm Number of attempts: 2 Airway Equipment and Method: Stylet Placement Confirmation: ETT inserted through vocal cords under direct vision,  positive ETCO2 and breath sounds checked- equal and bilateral Secured at: 22 cm Tube secured with: Tape Dental Injury: Teeth and Oropharynx as per pre-operative assessment

## 2018-08-01 NOTE — Transfer of Care (Signed)
Immediate Anesthesia Transfer of Care Note  Patient: Lori Shaw  Procedure(s) Performed: LAPAROSCOPIC CHOLECYSTECTOMY (N/A )  Patient Location: PACU  Anesthesia Type:General  Level of Consciousness: awake and alert   Airway & Oxygen Therapy: Patient Spontanous Breathing and Patient connected to face mask oxygen  Post-op Assessment: Report given to RN, Post -op Vital signs reviewed and stable and Patient moving all extremities X 4  Post vital signs: Reviewed and stable  Last Vitals:  Vitals Value Taken Time  BP 148/66 08/01/18 1830  Temp    Pulse 91 08/01/18 1833  Resp 19 08/01/18 1833  SpO2 100 % 08/01/18 1833  Vitals shown include unvalidated device data.  Last Pain:  Vitals:   08/01/18 1615  TempSrc: Oral  PainSc:       Patients Stated Pain Goal: 3 (63/84/66 5993)  Complications: No apparent anesthesia complications

## 2018-08-01 NOTE — Anesthesia Post-op Follow-up Note (Signed)
Anesthesia QCDR form completed.        

## 2018-08-01 NOTE — Care Management Important Message (Signed)
Important Message  Patient Details  Name: Cythia Bachtel MRN: [id number] Date of Birth: Jul 04, 1945   Medicare Important Message Given:  Yes     Juliann Pulse A Braxley Balandran 08/01/2018, 12:18 PM

## 2018-08-01 NOTE — Op Note (Signed)
Laparoscopic Cholecystectomy  Pre-operative Diagnosis: Gallstone pancreatitis  Post-operative Diagnosis: Same  Procedure: Laparoscopic cholecystectomy  Surgeon: Fredirick Maudlin, MD  Anesthesia: GETA  Assistant: None   Findings: The gallbladder was not inflamed.  There were multiple stones within the gallbladder.  There was no evidence of acute cholecystitis.   Estimated Blood Loss: Less than 5 cc         Drains: None         Specimens: Gallbladder           Complications: none   Procedure Details  The patient was seen again in the preoperative holding area. The benefits, complications, treatment options, and expected outcomes were discussed with the patient. The risks of bleeding, infection, recurrence of symptoms, failure to resolve symptoms, bile duct damage, bile duct leak, retained common bile duct stone, bowel injury, any of which could require further surgery and/or ERCP, stent, or papillotomy were reviewed with the patient. The likelihood of improving the patient's symptoms with return to their baseline status is good.  The patient and/or family concurred with the proposed plan, giving informed consent.  The patient was taken to operating room, identified as [facility] and the procedure verified as Laparoscopic Cholecystectomy. A time out was performed and the above information confirmed.  Prior to the induction of general anesthesia, antibiotic prophylaxis was administered. VTE prophylaxis was in place. General endotracheal anesthesia was then administered and tolerated well. After the induction, the abdomen was prepped with Chloraprep and draped in the sterile fashion. The patient was positioned in the supine position.  Optiview technique was used to enter the abdomen in the right upper quadrant.. Pneumoperitoneum was then created with CO2 and tolerated well without any adverse changes in the patient's vital signs.  2 national 5-mm ports were placed in the right  upper quadrant and a 12 mm periumbilical port was placed, all under direct vision. All skin incisions  were infiltrated with a local anesthetic agent before making the incision and placing the trocars.   The patient was positioned  in reverse Trendelenburg, tilted slightly to the patient's left.  The gallbladder was identified, the fundus grasped and retracted cephalad. Adhesions were lysed bluntly. The infundibulum was grasped and retracted laterally, exposing the peritoneum overlying the triangle of Calot. This was then divided and exposed in a blunt fashion. An extended critical view of the cystic duct and cystic artery was obtained.  The cystic duct was clearly identified and bluntly dissected free. Both the cystic artery and duct were double clipped and divided.  The gallbladder was taken from the gallbladder fossa in a retrograde fashion with the electrocautery. The gallbladder was removed and placed in an Endo pouch bag. The liver bed was irrigated and inspected. Hemostasis was achieved with the electrocautery. Copious saline irrigation was utilized and was repeatedly aspirated until clear.  The gallbladder and Endo pouch sac were then removed through a port site.    Inspection of the right upper quadrant was performed. No bleeding, bile duct injury or leak, or bowel injury was noted. Pneumoperitoneum was released.  The periumbilical port site was closed with interrumpted 0 Vicryl sutures. 4-0 subcuticular Monocryl was used to close the skin. Dermabond was applied.  The patient was then extubated and brought to the recovery room in stable condition. Sponge, lap, and needle counts were correct at closure and at the conclusion of the case.               Fredirick Maudlin, MD, FACS

## 2018-08-01 NOTE — Progress Notes (Addendum)
[place] Hospital Day(s): 4.   Post op day(s):  Marland Kitchen   Interval History: Patient seen and examined, no acute events or new complaints overnight. Patient reports that her abdominal pain has resolved. No reports of fever, chills, nausea, or emesis. She has remained NPO this morning. No acute issues. Plan for cholecystectomy this afternoon.    Vital signs in last 24 hours: [min-max] current  Temp:  [97.5 F (36.4 C)-97.9 F (36.6 C)] 97.9 F (36.6 C) (07/05 2349) Pulse Rate:  [52-57] 52 (07/05 2349) Resp:  [17] 17 (07/05 2349) BP: (113-146)/(68-73) 113/73 (07/05 2349) SpO2:  [95 %-99 %] 95 % (07/05 2349)     Height: 5\' 4"  (162.6 cm) Weight: 84.8 kg BMI (Calculated): 32.08   Intake/Output last 2 shifts:  07/05 0701 - 07/06 0700 In: 3529.7 [P.O.:2600; I.V.:929.7] Out: -    Physical Exam:  Constitutional: alert, cooperative and no distress  Respiratory: breathing non-labored at rest  Cardiovascular: regular rate and sinus rhythm  Gastrointestinal: soft, non-tender, and non-distended Integumentary: warm, dry  Labs:  CBC Latest Ref Rng & Units 07/30/2018 07/29/2018 07/28/2018  WBC 4.0 - 10.5 K/uL 15.6(H) 18.7(H) 18.7 Repeated and verified X2.(HH)  Hemoglobin 12.0 - 15.0 g/dL 13.5 13.6 14.7  Hematocrit 36.0 - 46.0 % 41.7 42.1 44.7  Platelets 150 - 400 K/uL 177 182 188.0   CMP Latest Ref Rng & Units 08/01/2018 07/31/2018 07/30/2018  Glucose 70 - 99 mg/dL 108(H) 110(H) 104(H)  BUN 8 - 23 mg/dL <5(L) <5(L) 6(L)  Creatinine 0.44 - 1.00 mg/dL 0.75 0.74 0.56  Sodium 135 - 145 mmol/L 142 142 141  Potassium 3.5 - 5.1 mmol/L 3.8 3.9 4.0  Chloride 98 - 111 mmol/L 108 112(H) 111  CO2 22 - 32 mmol/L 27 24 23   Calcium 8.9 - 10.3 mg/dL 8.8(L) 8.5(L) 8.2(L)  Total Protein 6.5 - 8.1 g/dL 5.8(L) 5.9(L) 5.4(L)  Total Bilirubin 0.3 - 1.2 mg/dL 0.3 0.5 0.6  Alkaline Phos 38 - 126 U/L 95 102 93  AST 15 - 41 U/L 24 29 47(H)  ALT 0 - 44 U/L 81(H) 103(H) 137(H)      Imaging studies: No new pertinent imaging studies   Assessment/Plan: (ICD-10's: K85.10,) 73 y.o. female with resolved abdominal pain secondary to gallstone pancreatitis pending laparoscopic cholecystectomy this afternoon   - NPO + IVF   - ABx on call to OR (Ancef)  - pain control prn; antiemetics prn  - Monitor abdominal examination    - Will plan for laparoscopic cholecystectomy this afternoon with Dr Celine Ahr pending OR/Anesthesia availability.  - All risks, benefits, and alternatives to above procedure(s) were discussed with the patient, all of her questions were answered to her expressed satisfaction, patient expresses she wishes to proceed, and informed consent was obtained.  - Further management per primary service  All of the above findings and recommendations were discussed with the patient, patient's family (husband via telephone), and the medical team, and all of patient's and family's questions were answered to their expressed satisfaction.  -- Edison Simon, PA-C [facility] 08/01/2018, 7:36 AM [phone] M-F: 7am - 4pm  I saw and evaluated the patient.  I agree with the above documentation, exam, and plan, which I have edited where appropriate. Fredirick Maudlin  4:51 PM

## 2018-08-02 ENCOUNTER — Encounter: Payer: Self-pay | Admitting: General Surgery

## 2018-08-02 LAB — LIPASE, BLOOD: Lipase: 87 U/L — ABNORMAL HIGH (ref 11–51)

## 2018-08-02 LAB — COMPREHENSIVE METABOLIC PANEL
ALT: 83 U/L — ABNORMAL HIGH (ref 0–44)
AST: 46 U/L — ABNORMAL HIGH (ref 15–41)
Albumin: 3.8 g/dL (ref 3.5–5.0)
Alkaline Phosphatase: 95 U/L (ref 38–126)
Anion gap: 8 (ref 5–15)
BUN: 7 mg/dL — ABNORMAL LOW (ref 8–23)
CO2: 26 mmol/L (ref 22–32)
Calcium: 8.7 mg/dL — ABNORMAL LOW (ref 8.9–10.3)
Chloride: 105 mmol/L (ref 98–111)
Creatinine, Ser: 0.76 mg/dL (ref 0.44–1.00)
GFR calc Af Amer: >60 mL/min (ref >60)
GFR calc non Af Amer: >60 mL/min (ref >60)
Glucose, Bld: 164 mg/dL — ABNORMAL HIGH (ref 70–99)
Potassium: 4.3 mmol/L (ref 3.5–5.1)
Sodium: 139 mmol/L (ref 135–145)
Total Bilirubin: 0.2 mg/dL — ABNORMAL LOW (ref 0.3–1.2)
Total Protein: 6.3 g/dL — ABNORMAL LOW (ref 6.5–8.1)

## 2018-08-02 MED ORDER — OXYCODONE HCL 5 MG PO TABS: 5.0000 mg | ORAL_TABLET | Freq: Four times a day (QID) | ORAL | 0 refills | Status: DC | PRN

## 2018-08-02 NOTE — Discharge Summary (Addendum)
[facility] SURGICAL DISCHARGE SUMMARY  Patient ID: Lori Shaw MRN: [id number] DOB/AGE: 1945-04-30 73 y.o.  Admit date: 07/28/2018 Discharge date: 08/02/2018  Discharge Diagnoses Gallstone Pancreatitis  Consultants Medicine  Procedures 08/01/2018:  Laparoscopic Cholecystectomy  HPI: Lori Shaw is a 73 y.o. female admitted to [facility] on 07/02 for abdominal pain attributed to gallstone pancreatitis.   Hospital Course: The patient's labs were trended down and her abdominal pain resolved. On 07/06, informed consent was obtained and documented, and patient underwent uneventful laparoscopic cholecystectomy (Dr Celine Ahr, 08/01/2018).  Post-operatively, patient's pain improved/resolved and advancement of patient's diet and ambulation were well-tolerated. The remainder of patient's hospital course was essentially unremarkable, and discharge planning was initiated accordingly with patient safely able to be discharged home with appropriate discharge instructions, pain control, and outpatient follow-up after all of her questions were answered to her expressed satisfaction.  Discharge Condition: Good   Physical Examination:  Constitutional: Well appearing female, NAD HEENT: No scleral icterus Pulmonary: Normal effort, no respiratory distress Gastrointestinal: Soft, expected incisional soreness, non-distended, no rebound/guarding Skin: Laparoscopic incisions are CDI with steri-strips, no erythema or drainage   Allergies as of 08/02/2018      Reactions   Sulfa Antibiotics Nausea Only      Medication List    TAKE these medications   cholecalciferol 25 MCG (1000 UT) tablet Commonly known as: VITAMIN D3 Take 2,000 Units by mouth daily.   fluticasone 50 MCG/ACT nasal spray Commonly known as: FLONASE Place 2 sprays into both nostrils daily as needed.   oxyCODONE 5 MG immediate release tablet Commonly known as: Oxy IR/ROXICODONE Take 1 tablet (5 mg  total) by mouth every 6 (six) hours as needed for severe pain.   Vitamin B-12 2500 MCG Subl Place 1 tablet (2,500 mcg total) under the tongue daily.   Vitamin D (Ergocalciferol) 1.25 MG (50000 UT) Caps capsule Commonly known as: DRISDOL Take 1 capsule (50,000 Units total) by mouth once a week.        Follow-up Information    Lori Maudlin, MD. Schedule an appointment as soon as possible for a visit in 2 week(s).   Specialty: General Surgery Why: s/p lap chole Contact information: [place] [address] [place] [address] [phone]            Time spent on discharge management including discussion of hospital course, clinical condition, outpatient instructions, prescriptions, and follow up with the patient and members of the medical team: >30 minutes  -- Edison Simon , PA-C [facility]  08/02/2018, 8:04 AM [phone] M-F: 7am - 4pm

## 2018-08-02 NOTE — Anesthesia Postprocedure Evaluation (Signed)
Anesthesia Post Note  Patient: Lori Shaw  Procedure(s) Performed: LAPAROSCOPIC CHOLECYSTECTOMY (N/A )  Patient location during evaluation: PACU Anesthesia Type: General Level of consciousness: awake and alert Pain management: pain level controlled Vital Signs Assessment: post-procedure vital signs reviewed and stable Respiratory status: spontaneous breathing, nonlabored ventilation, respiratory function stable and patient connected to nasal cannula oxygen Cardiovascular status: blood pressure returned to baseline and stable Postop Assessment: no apparent nausea or vomiting Anesthetic complications: no     Last Vitals:  Vitals:   08/01/18 2012 08/01/18 2347  BP: 140/66 124/61  Pulse: 65 74  Resp: 18 18  Temp:  36.5 C  SpO2: 96% 91%    Last Pain:  Vitals:   08/01/18 2356  TempSrc:   PainSc: 8                  Martha Clan

## 2018-08-02 NOTE — Discharge Instructions (Signed)
In addition to included general post-operative instructions for laparoscopic cholecystectomy,  Diet: Resume home diet, may consider low fat diet while body adjust to not having gallbladder.   Activity: No heavy lifting >20 pounds (children, pets, laundry, garbage) or strenuous activity until follow-up in 2 weeks, but light activity and walking are encouraged. Do not drive or drink alcohol if taking narcotic pain medications or having pain that might distract from driving.  Wound care: 2 days after surgery (07/08), you may shower/get incision wet with soapy water and pat dry (do not rub incisions), but no baths or submerging incision underwater until follow-up.   Medications: Resume all home medications. For mild to moderate pain: acetaminophen (Tylenol) or ibuprofen/naproxen (if no kidney disease). Combining Tylenol with alcohol can substantially increase your risk of causing liver disease. Narcotic pain medications, if prescribed, can be used for severe pain, though may cause nausea, constipation, and drowsiness. Do not combine Tylenol and Percocet (or similar) within a 6 hour period as Percocet (and similar) contain(s) Tylenol. If you do not need the narcotic pain medication, you do not need to fill the prescription.  Call office [phone] / [phone]) at any time if any questions, worsening pain, fevers/chills, bleeding, drainage from incision site, or other concerns.

## 2018-08-03 ENCOUNTER — Telehealth: Payer: Self-pay | Admitting: *Deleted

## 2018-08-03 LAB — SURGICAL PATHOLOGY

## 2018-08-03 NOTE — Telephone Encounter (Signed)
Pt was on TCM report admitted to [facility] on 07/02 for abdominal pain attributed to gallstone pancreatitis. On 07/06, patient underwent uneventful laparoscopic cholecystectomy by (Dr Celine Ahr).  Post-operatively, patient's pain improved/resolved and advancement of patient's diet and ambulation were well-tolerated. Pt D/C 08/02/18, and will follow-up Dr. Celine Ahr in 2 weeks...Lori Shaw

## 2018-08-07 ENCOUNTER — Encounter: Payer: Self-pay | Admitting: Family Medicine

## 2018-08-08 ENCOUNTER — Other Ambulatory Visit: Payer: Self-pay

## 2018-08-08 ENCOUNTER — Encounter: Payer: Self-pay | Admitting: Family Medicine

## 2018-08-08 NOTE — Patient Outreach (Signed)
[facility]) Care Management  08/08/2018  [place] 1945-12-02 [id number]  EMMI: general discharge red alert Referral date: 08/08/18 Referral reason: Lost interest in things: yes Insurance: [employer] Day # 4  Received voice mail message from patient. Call returned to patient. HIPAA verified. RNCM introduced herself and explained reason for call. Patient states she is not having a lost of interest in things.  Patient states she just feels like she is not getting back to normal fast enough. Patient states she is not experiencing any pain from the surgery. She reports she has some back pain and takes tylenol as needed.  Patient reports her bowel movements are getting back to normal. She states she has been drinking prune juice to help with that.  Patient states she has a follow up scheduled with the surgeon on 08/17/18.  Patient states she sent her primary MD an email this morning asking if she needed to be seen after the surgery.  RNCM discussed signs/ symptoms on infections.  Patient denies any symptoms of infection at surgical sites. Patient states she does  Have some bruising.  Patient denies any further needs or concerns at this time.  RNCM advised patient to notify MD of any changes in condition prior to scheduled appointment. RNCM provided contact name and number:24 hour nurse advise line [phone].  RNCM verified patient aware of 911 services for urgent/ emergent needs./  PLAN: RNCM will close case due to patient being assessed and having no further needs.   Quinn Plowman RN,BSN,CCM [facility] Telephonic  [phone]

## 2018-08-08 NOTE — Patient Outreach (Signed)
[facility] [facility]) Care Management  08/08/2018  [place] May 07, 1945 [id number]  EMMI: general discharge red alert Referral date: 08/08/18 Referral reason: Lost interest in things: yes Insurance: [employer] Day # 4  Attempt #1  Telephone call to patient regarding EMMI general discharge red alert. Unable to reach patient. HIPAA compliant voice message left with call back phone number.    PLAN: RNCM will attempt 2nd telephone call to patient within 4 business days.  RNCM will send outreach letter to attempt contact  Quinn Plowman RN,BSN,CCM [facility] Telephonic  [phone]

## 2018-08-09 ENCOUNTER — Ambulatory Visit: Payer: Medicare HMO

## 2018-08-10 ENCOUNTER — Ambulatory Visit (INDEPENDENT_AMBULATORY_CARE_PROVIDER_SITE_OTHER): Payer: Medicare HMO | Admitting: Physician Assistant

## 2018-08-12 ENCOUNTER — Other Ambulatory Visit (INDEPENDENT_AMBULATORY_CARE_PROVIDER_SITE_OTHER): Payer: Self-pay | Admitting: Physician Assistant

## 2018-08-12 DIAGNOSIS — E559 Vitamin D deficiency, unspecified: Secondary | ICD-10-CM

## 2018-08-17 ENCOUNTER — Other Ambulatory Visit: Payer: Self-pay

## 2018-08-17 ENCOUNTER — Ambulatory Visit (INDEPENDENT_AMBULATORY_CARE_PROVIDER_SITE_OTHER): Payer: Medicare HMO | Admitting: General Surgery

## 2018-08-17 ENCOUNTER — Encounter: Payer: Self-pay | Admitting: General Surgery

## 2018-08-17 VITALS — BP 117/76 | HR 61 | Temp 97.8°F | Ht 64.0 in | Wt 184.0 lb

## 2018-08-17 DIAGNOSIS — Z9049 Acquired absence of other specified parts of digestive tract: Secondary | ICD-10-CM

## 2018-08-17 NOTE — Patient Instructions (Signed)
Return as needed.The patient is aware to call back for any questions or concerns.  

## 2018-08-17 NOTE — Progress Notes (Signed)
Lori Shaw is here today for a postoperative visit.  She underwent an uncomplicated laparoscopic cholecystectomy on August 01, 2018.  She had gallstone pancreatitis, which prompted her admission.  Once her pancreatitis had essentially resolved.  She was taken to the operating room.  She was discharged home the following day.  Since her surgery, she states that her appetite has not been the best, but she is eating adequately.  She has not had any nausea or vomiting.  Her bowels are moving normally.  She has not had significant pain.  She was concerned by the finding of fatty liver on her ultrasound, but she was reassured that this is a common finding.  She should follow-up with her primary care doctor in this regard.  Vitals:   08/17/18 1006  BP: 117/76  Pulse: 61  Temp: 97.8 F (36.6 C)  SpO2: 98%   Focused abdominal exam: Laparoscopic port sites are healing up nicely.  There is still some bruising adjacent to the right most lateral port.  There is no erythema, induration, or drainage present.  Pathology: DIAGNOSIS:  A. GALLBLADDER; CHOLECYSTECTOMY:  - CHRONIC CHOLECYSTITIS WITH CHOLELITHIASIS.  - NEGATIVE FOR DYSPLASIA AND MALIGNANCY.  Impression and plan: This is a 73 year old woman who was admitted to the hospital with gallstone pancreatitis.  She underwent an uncomplicated laparoscopic cholecystectomy.  Although her appetite is still less than normal, she was reassured that this should return over the next couple of weeks.  She should follow-up with her primary care doctor regarding the fatty liver findings.  I will see her on an as-needed basis.

## 2018-08-29 ENCOUNTER — Other Ambulatory Visit: Payer: Self-pay

## 2018-08-29 ENCOUNTER — Ambulatory Visit
Admission: RE | Admit: 2018-08-29 | Discharge: 2018-08-29 | Disposition: A | Discharge status: Home / Self Care | Payer: Medicare HMO | Source: Ambulatory Visit | Attending: Family Medicine | Admitting: Family Medicine

## 2018-08-29 DIAGNOSIS — Z13820 Encounter for screening for osteoporosis: Secondary | ICD-10-CM | POA: Diagnosis not present

## 2018-08-29 DIAGNOSIS — E559 Vitamin D deficiency, unspecified: Secondary | ICD-10-CM | POA: Diagnosis not present

## 2018-08-29 DIAGNOSIS — Z780 Asymptomatic menopausal state: Secondary | ICD-10-CM | POA: Diagnosis not present

## 2018-08-29 DIAGNOSIS — M8580 Other specified disorders of bone density and structure, unspecified site: Secondary | ICD-10-CM | POA: Diagnosis not present

## 2018-08-29 DIAGNOSIS — Z1231 Encounter for screening mammogram for malignant neoplasm of breast: Secondary | ICD-10-CM | POA: Diagnosis not present

## 2018-08-29 DIAGNOSIS — Z1239 Encounter for other screening for malignant neoplasm of breast: Secondary | ICD-10-CM

## 2018-08-29 DIAGNOSIS — Z0000 Encounter for general adult medical examination without abnormal findings: Secondary | ICD-10-CM

## 2018-08-29 DIAGNOSIS — M8589 Other specified disorders of bone density and structure, multiple sites: Secondary | ICD-10-CM | POA: Diagnosis not present

## 2018-08-29 LAB — HM MAMMOGRAPHY

## 2018-08-30 ENCOUNTER — Encounter: Payer: Self-pay | Admitting: Family Medicine

## 2018-09-05 ENCOUNTER — Telehealth (INDEPENDENT_AMBULATORY_CARE_PROVIDER_SITE_OTHER): Payer: Medicare HMO | Admitting: Physician Assistant

## 2018-09-05 ENCOUNTER — Encounter (INDEPENDENT_AMBULATORY_CARE_PROVIDER_SITE_OTHER): Payer: Self-pay | Admitting: Physician Assistant

## 2018-09-05 ENCOUNTER — Other Ambulatory Visit: Payer: Self-pay

## 2018-09-05 DIAGNOSIS — E669 Obesity, unspecified: Secondary | ICD-10-CM | POA: Diagnosis not present

## 2018-09-05 DIAGNOSIS — Z6831 Body mass index (BMI) 31.0-31.9, adult: Secondary | ICD-10-CM

## 2018-09-05 DIAGNOSIS — E559 Vitamin D deficiency, unspecified: Secondary | ICD-10-CM | POA: Diagnosis not present

## 2018-09-05 MED ORDER — VITAMIN D (ERGOCALCIFEROL) 1.25 MG (50000 UNIT) PO CAPS: 50000.0000 [IU] | ORAL_CAPSULE | ORAL | 0 refills | Status: DC

## 2018-09-06 NOTE — Progress Notes (Signed)
Office: [phone]  /  Fax: [phone] TeleHealth Visit:  [name] has verbally consented to this TeleHealth visit today. The patient is located at home, the provider is located at the [employer] [facility] office. The participants in this visit include the listed provider and patient. The visit was conducted today via Webex (25 minutes).  HPI:   Chief Complaint: OBESITY Lori Shaw is here to discuss her progress with her obesity treatment plan. She is on the Category 2 plan and is following her eating plan approximately 40% of the time. She states she is walking 15 minutes 3 times per week. Lori Shaw reports her most recent weight to be 183 lbs. She is status post cholecystectomy and has not been eating on plan due to lack of appetite and quick satiety. She is not eating any fatty foods as directed. We were unable to weigh the patient today for this TeleHealth visit. She feels as if she has maintained her weight since her last visit. She has lost 15 lbs since starting treatment with [name].  Vitamin D deficiency Lori Shaw has a diagnosis of Vitamin D deficiency and her last level was at goal. She is currently taking prescription Vit D and denies nausea, vomiting or muscle weakness.  ASSESSMENT AND PLAN:  Vitamin D deficiency - Plan: Vitamin D, Ergocalciferol, (DRISDOL) 1.25 MG (50000 UT) CAPS capsule  Class 1 obesity with serious comorbidity and body mass index (BMI) of 31.0 to 31.9 in adult, unspecified obesity type  PLAN:  Vitamin D Deficiency Lori Shaw was informed that low Vitamin D levels contributes to fatigue and are associated with obesity, breast, and colon cancer. She will change to taking prescription Vit D @ 50,000 IU every 14 days #2 with 0 refills and will follow-up for routine testing of Vitamin D, at least 2-3 times per year. She was informed of the risk of over-replacement of Vitamin D and agrees to not increase her dose unless she discusses this with [name]  first. Lori Shaw agrees to follow-up with our clinic in 2 weeks.  Obesity Lori Shaw is currently in the action stage of change. As such, her goal is to continue with weight loss efforts. She has agreed to follow the Category 2 plan. Lori Shaw has been instructed to work up to a goal of 150 minutes of combined cardio and strengthening exercise per week for weight loss and overall health benefits. We discussed the following Behavioral Modification Strategies today: increasing lean protein intake, work on meal planning and easy cooking plans.  Lori Shaw has agreed to follow-up with our clinic in 2 weeks. She was informed of the importance of frequent follow-up visits to maximize her success with intensive lifestyle modifications for her multiple health conditions.  I spent > than 50% of the 25 minute visit on counseling as documented in the note.    ALLERGIES: Allergies  Allergen Reactions   Sulfa Antibiotics Nausea Only    MEDICATIONS: Current Outpatient Medications on File Prior to Visit  Medication Sig Dispense Refill   cholecalciferol (VITAMIN D3) 25 MCG (1000 UT) tablet Take 2,000 Units by mouth daily.     Cyanocobalamin (VITAMIN B-12) 2500 MCG SUBL Place 1 tablet (2,500 mcg total) under the tongue daily.  0   fluticasone (FLONASE) 50 MCG/ACT nasal spray Place 2 sprays into both nostrils daily as needed. 16 g 11   No current facility-administered medications on file prior to visit.     PAST MEDICAL HISTORY: Past Medical History:  Diagnosis Date   B12 deficiency  Back pain    CLL (chronic lymphocytic leukemia) ([place]) 06/21/2016   Rai stage 0, dx 06/2016 (Dr Janese Banks)   HLD (hyperlipidemia)    Joint pain    Knee problem    OSA on CPAP 2006, 04/2014   AHI of 23.8 and an RDI of 42.9, on CPAP 9 cm H2O (Dohmeier)   Osteopenia 08/2010   T -1.4 at L femur neck   Piriformis syndrome 12/14/2014   Vitamin D deficiency     PAST SURGICAL HISTORY: Past Surgical History:    Procedure Laterality Date   ABDOMINAL HYSTERECTOMY  01/27/1983   for menometrorrhagia, one ovary remained   CHOLECYSTECTOMY N/A 08/01/2018   LAPAROSCOPIC CHOLECYSTECTOMY; for gallstone pancreatitis  Celine Ahr, Anderson Malta, MD)   COLONOSCOPY  2012   no records available. per patient normal.    dexa  08/2010   osteopenia   TONSILLECTOMY  01/26/1973    SOCIAL HISTORY: Social History   Tobacco Use   Smoking status: Former Smoker    Packs/day: 2.00    Years: 20.00    Pack years: 40.00    Types: Cigarettes    Quit date: 01/26/1978    Years since quitting: 40.6   Smokeless tobacco: Never Used  Substance Use Topics   Alcohol use: No   Drug use: Yes    Comment: occassionally glass of wine    FAMILY HISTORY: Family History  Problem Relation Age of Onset   Hyperlipidemia Mother    CAD Mother        CABG   Hypertension Mother    Hyperlipidemia Father    CAD Father        MI   Sudden death Father    Arthritis Maternal Grandmother    Cancer Sister        breast   Breast cancer Sister 65   Alzheimer's disease Other    Heart attack Other    Diabetes Neg Hx    Stroke Neg Hx    ROS: Review of Systems  Gastrointestinal: Negative for nausea and vomiting.  Musculoskeletal:       Negative for muscle weakness.   PHYSICAL EXAM: Pt in no acute distress  RECENT LABS AND TESTS: BMET    Component Value Date/Time   NA 139 08/02/2018 0513   NA 142 03/17/2018 1041   K 4.3 08/02/2018 0513   CL 105 08/02/2018 0513   CO2 26 08/02/2018 0513   GLUCOSE 164 (H) 08/02/2018 0513   BUN 7 (L) 08/02/2018 0513   BUN 16 03/17/2018 1041   CREATININE 0.76 08/02/2018 0513   CALCIUM 8.7 (L) 08/02/2018 0513   GFRNONAA >60 08/02/2018 0513   GFRAA >60 08/02/2018 0513   Lab Results  Component Value Date   HGBA1C 5.4 03/17/2018   HGBA1C 5.5 11/25/2017   Lab Results  Component Value Date   INSULIN 20.9 03/17/2018   INSULIN 6.6 11/25/2017   CBC    Component Value  Date/Time   WBC 15.6 (H) 07/30/2018 0504   RBC 4.34 07/30/2018 0504   HGB 13.5 07/30/2018 0504   HGB 15.1 11/25/2017 1208   HGB 14.5 04/10/2010   HCT 41.7 07/30/2018 0504   HCT 44.8 11/25/2017 1208   PLT 177 07/30/2018 0504   MCV 96.1 07/30/2018 0504   MCV 92 11/25/2017 1208   MCH 31.1 07/30/2018 0504   MCHC 32.4 07/30/2018 0504   RDW 14.0 07/30/2018 0504   RDW 13.5 11/25/2017 1208   LYMPHSABS 10.8 (H) 07/28/2018 1323   LYMPHSABS 8.7 (H)  11/25/2017 1208   MONOABS 0.9 07/28/2018 1323   EOSABS 0.2 07/28/2018 1323   EOSABS 0.3 11/25/2017 1208   BASOSABS 0.1 07/28/2018 1323   BASOSABS 0.1 11/25/2017 1208   Iron/TIBC/Ferritin/ %Sat No results found for: IRON, TIBC, FERRITIN, IRONPCTSAT Lipid Panel     Component Value Date/Time   CHOL 247 (H) 07/15/2018 1553   CHOL 215 (H) 03/17/2018 1041   TRIG 239 (H) 07/15/2018 1553   TRIG 152 04/10/2010   HDL 53 07/15/2018 1553   HDL 45 03/17/2018 1041   CHOLHDL 4.7 07/15/2018 1553   VLDL 46.0 (H) 07/13/2017 1400   LDLCALC 153 (H) 07/15/2018 1553   LDLCALC 67 04/10/2010   LDLDIRECT 141.0 07/13/2017 1400   Hepatic Function Panel     Component Value Date/Time   PROT 6.3 (L) 08/02/2018 0513   PROT 6.5 03/17/2018 1041   ALBUMIN 3.8 08/02/2018 0513   ALBUMIN 4.6 03/17/2018 1041   AST 46 (H) 08/02/2018 0513   AST 18 04/10/2010   ALT 83 (H) 08/02/2018 0513   ALKPHOS 95 08/02/2018 0513   ALKPHOS 79 04/10/2010   BILITOT 0.2 (L) 08/02/2018 0513   BILITOT 0.5 03/17/2018 1041      Component Value Date/Time   TSH 2.510 11/25/2017 1208   TSH 3.46 06/19/2016 0808   TSH 1.28 09/24/2015 0831   Results for Lori Shaw, Lori Shaw (MRN [id number]) as of 09/06/2018 11:44  Ref. Range 07/15/2018 15:53  Vitamin D, 25-Hydroxy Latest Ref Range: 30 - 100 ng/mL 52   I, Michaelene Song, am acting as Location manager for [employer], PA-C I, Abby Potash, PA-C have reviewed above note and agree with its content

## 2018-09-16 DIAGNOSIS — H5203 Hypermetropia, bilateral: Secondary | ICD-10-CM | POA: Diagnosis not present

## 2018-09-16 DIAGNOSIS — D3131 Benign neoplasm of right choroid: Secondary | ICD-10-CM | POA: Diagnosis not present

## 2018-09-16 DIAGNOSIS — R69 Illness, unspecified: Secondary | ICD-10-CM | POA: Diagnosis not present

## 2018-09-20 ENCOUNTER — Other Ambulatory Visit: Payer: Self-pay

## 2018-09-20 ENCOUNTER — Encounter (INDEPENDENT_AMBULATORY_CARE_PROVIDER_SITE_OTHER): Payer: Self-pay | Admitting: Family Medicine

## 2018-09-20 ENCOUNTER — Telehealth (INDEPENDENT_AMBULATORY_CARE_PROVIDER_SITE_OTHER): Payer: Medicare HMO | Admitting: Family Medicine

## 2018-09-20 DIAGNOSIS — E559 Vitamin D deficiency, unspecified: Secondary | ICD-10-CM | POA: Diagnosis not present

## 2018-09-20 DIAGNOSIS — Z6831 Body mass index (BMI) 31.0-31.9, adult: Secondary | ICD-10-CM | POA: Diagnosis not present

## 2018-09-20 DIAGNOSIS — E669 Obesity, unspecified: Secondary | ICD-10-CM

## 2018-09-20 DIAGNOSIS — E88819 Insulin resistance, unspecified: Secondary | ICD-10-CM

## 2018-09-20 DIAGNOSIS — E8881 Metabolic syndrome: Secondary | ICD-10-CM | POA: Diagnosis not present

## 2018-09-20 MED ORDER — VITAMIN D (ERGOCALCIFEROL) 1.25 MG (50000 UNIT) PO CAPS: 50000.0000 [IU] | ORAL_CAPSULE | ORAL | 0 refills | Status: DC

## 2018-09-20 NOTE — Progress Notes (Signed)
Office: [phone]  /  Fax: [phone] TeleHealth Visit:  [place] has verbally consented to this TeleHealth visit today. The patient is located at home, the provider is located at the [employer] [facility] office. The participants in this visit include the listed provider and patient. The visit was conducted today via Webex.  HPI:   Chief Complaint: OBESITY Lori Shaw is here to discuss her progress with her obesity treatment plan. She is on the Category 2 plan and is following her eating plan approximately 50% of the time. She states she is exercising 0 minutes 0 times per week. Lori Shaw reports her weight today to be 184 lbs. She had a recent cholecystectomy for stones and pancreatitis and is gaining her strength back. We were unable to weigh the patient today for this TeleHealth visit. She feels as if she has gained 1 lb since her last visit. She has lost 15 lbs since starting treatment with [name].  Vitamin D deficiency Lori Shaw has a diagnosis of Vitamin D deficiency. Vitamin D is at goal. She is currently taking prescription Vit D every other week and denies nausea, vomiting or muscle weakness.  Insulin Resistance Lori Shaw has a diagnosis of insulin resistance based on her elevated fasting insulin level >5. Last A1c was 5.4 on 03/17/2018. Although Lori Shaw's blood glucose readings are still under good control, insulin resistance puts her at greater risk of metabolic syndrome and diabetes. She is not taking metformin currently and continues to work on diet and exercise to decrease risk of diabetes. Lab Results  Component Value Date   HGBA1C 5.4 03/17/2018    ASSESSMENT AND PLAN:  Vitamin D deficiency - Plan: Vitamin D, Ergocalciferol, (DRISDOL) 1.25 MG (50000 UT) CAPS capsule  Insulin resistance  Class 1 obesity with serious comorbidity and body mass index (BMI) of 31.0 to 31.9 in adult, unspecified obesity type  PLAN:  Vitamin D Deficiency Lori Shaw was  informed that low Vitamin D levels contributes to fatigue and are associated with obesity, breast, and colon cancer. She agrees to continue to take prescription Vit D @ 50,000 IU every other week #4 with 0 refills and will follow-up for routine testing of Vitamin D, at least 2-3 times per year. She was informed of the risk of over-replacement of Vitamin D and agrees to not increase her dose unless she discusses this with [name] first. Lori Shaw agrees to follow-up with our clinic in 2-3 weeks.  Insulin Resistance Lori Shaw will continue to work on weight loss, exercise, and decreasing simple carbohydrates in her diet to help decrease the risk of diabetes. We dicussed metformin including benefits and risks. She was informed that eating too many simple carbohydrates or too many calories at one sitting increases the likelihood of GI side effects. Lori Shaw will continue her meal plan and follow-up with [name] as directed to monitor her progress.  Obesity Lori Shaw is currently in the action stage of change. As such, her goal is to continue with weight loss efforts. She has agreed to follow the Category 2 plan or journal 1200-1300 calories + 85-90 grams of protein daily. Lori Shaw has been instructed to exercise as tolerated for weight loss and overall health benefits. We discussed the following Behavioral Modification Strategies today: increasing lean protein intake and decreasing simple carbohydrates, planning for success, and keep a strict food journal.  Lori Shaw has agreed to follow-up with our clinic in 2-3 weeks. She was informed of the importance of frequent follow-up visits to maximize her success with intensive lifestyle modifications for  her multiple health conditions.  ALLERGIES: Allergies  Allergen Reactions  . Sulfa Antibiotics Nausea Only    MEDICATIONS: Current Outpatient Medications on File Prior to Visit  Medication Sig Dispense Refill  . cholecalciferol (VITAMIN D3) 25 MCG (1000 UT) tablet  Take 2,000 Units by mouth daily.    . Cyanocobalamin (VITAMIN B-12) 2500 MCG SUBL Place 1 tablet (2,500 mcg total) under the tongue daily.  0  . fluticasone (FLONASE) 50 MCG/ACT nasal spray Place 2 sprays into both nostrils daily as needed. 16 g 11   No current facility-administered medications on file prior to visit.     PAST MEDICAL HISTORY: Past Medical History:  Diagnosis Date  . B12 deficiency   . Back pain   . CLL (chronic lymphocytic leukemia) ([place]) 06/21/2016   Rai stage 0, dx 06/2016 (Dr Janese Banks)  . HLD (hyperlipidemia)   . Joint pain   . Knee problem   . OSA on CPAP 2006, 04/2014   AHI of 23.8 and an RDI of 42.9, on CPAP 9 cm H2O ([facility])  . Osteopenia 08/2010   T -1.4 at L femur neck  . Piriformis syndrome 12/14/2014  . Vitamin D deficiency     PAST SURGICAL HISTORY: Past Surgical History:  Procedure Laterality Date  . ABDOMINAL HYSTERECTOMY  01/27/1983   for menometrorrhagia, one ovary remained  . CHOLECYSTECTOMY N/A 08/01/2018   LAPAROSCOPIC CHOLECYSTECTOMY; for gallstone pancreatitis  Celine Ahr, Anderson Malta, MD)  . COLONOSCOPY  2012   no records available. per patient normal.   . dexa  08/2010   osteopenia  . TONSILLECTOMY  01/26/1973    SOCIAL HISTORY: Social History   Tobacco Use  . Smoking status: Former Smoker    Packs/day: 2.00    Years: 20.00    Pack years: 40.00    Types: Cigarettes    Quit date: 01/26/1978    Years since quitting: 40.6  . Smokeless tobacco: Never Used  Substance Use Topics  . Alcohol use: No  . Drug use: Yes    Comment: occassionally glass of wine    FAMILY HISTORY: Family History  Problem Relation Age of Onset  . Hyperlipidemia Mother   . CAD Mother        CABG  . Hypertension Mother   . Hyperlipidemia Father   . CAD Father        MI  . Sudden death Father   . Arthritis Maternal Grandmother   . Cancer Sister        breast  . Breast cancer Sister 60  . Alzheimer's disease Other   . Heart attack Other   . Diabetes Neg Hx    . Stroke Neg Hx    ROS: Review of Systems  Gastrointestinal: Negative for nausea and vomiting.  Musculoskeletal:       Negative for muscle weakness.,   PHYSICAL EXAM: Pt in no acute distress  RECENT LABS AND TESTS: BMET    Component Value Date/Time   NA 139 08/02/2018 0513   NA 142 03/17/2018 1041   K 4.3 08/02/2018 0513   CL 105 08/02/2018 0513   CO2 26 08/02/2018 0513   GLUCOSE 164 (H) 08/02/2018 0513   BUN 7 (L) 08/02/2018 0513   BUN 16 03/17/2018 1041   CREATININE 0.76 08/02/2018 0513   CALCIUM 8.7 (L) 08/02/2018 0513   GFRNONAA >60 08/02/2018 0513   GFRAA >60 08/02/2018 0513   Lab Results  Component Value Date   HGBA1C 5.4 03/17/2018   HGBA1C 5.5 11/25/2017  Lab Results  Component Value Date   INSULIN 20.9 03/17/2018   INSULIN 6.6 11/25/2017   CBC    Component Value Date/Time   WBC 15.6 (H) 07/30/2018 0504   RBC 4.34 07/30/2018 0504   HGB 13.5 07/30/2018 0504   HGB 15.1 11/25/2017 1208   HGB 14.5 04/10/2010   HCT 41.7 07/30/2018 0504   HCT 44.8 11/25/2017 1208   PLT 177 07/30/2018 0504   MCV 96.1 07/30/2018 0504   MCV 92 11/25/2017 1208   MCH 31.1 07/30/2018 0504   MCHC 32.4 07/30/2018 0504   RDW 14.0 07/30/2018 0504   RDW 13.5 11/25/2017 1208   LYMPHSABS 10.8 (H) 07/28/2018 1323   LYMPHSABS 8.7 (H) 11/25/2017 1208   MONOABS 0.9 07/28/2018 1323   EOSABS 0.2 07/28/2018 1323   EOSABS 0.3 11/25/2017 1208   BASOSABS 0.1 07/28/2018 1323   BASOSABS 0.1 11/25/2017 1208   Iron/TIBC/Ferritin/ %Sat No results found for: IRON, TIBC, FERRITIN, IRONPCTSAT Lipid Panel     Component Value Date/Time   CHOL 247 (H) 07/15/2018 1553   CHOL 215 (H) 03/17/2018 1041   TRIG 239 (H) 07/15/2018 1553   TRIG 152 04/10/2010   HDL 53 07/15/2018 1553   HDL 45 03/17/2018 1041   CHOLHDL 4.7 07/15/2018 1553   VLDL 46.0 (H) 07/13/2017 1400   LDLCALC 153 (H) 07/15/2018 1553   LDLCALC 67 04/10/2010   LDLDIRECT 141.0 07/13/2017 1400   Hepatic Function Panel      Component Value Date/Time   PROT 6.3 (L) 08/02/2018 0513   PROT 6.5 03/17/2018 1041   ALBUMIN 3.8 08/02/2018 0513   ALBUMIN 4.6 03/17/2018 1041   AST 46 (H) 08/02/2018 0513   AST 18 04/10/2010   ALT 83 (H) 08/02/2018 0513   ALKPHOS 95 08/02/2018 0513   ALKPHOS 79 04/10/2010   BILITOT 0.2 (L) 08/02/2018 0513   BILITOT 0.5 03/17/2018 1041      Component Value Date/Time   TSH 2.510 11/25/2017 1208   TSH 3.46 06/19/2016 0808   TSH 1.28 09/24/2015 0831   Results for LADRENA, ERICKSEN (MRN [id number]) as of 09/20/2018 16:11  Ref. Range 07/15/2018 15:53  Vitamin D, 25-Hydroxy Latest Ref Range: 30 - 100 ng/mL 52   I, Michaelene Song, am acting as Location manager for [employer], FNP  I have reviewed the above documentation for accuracy and completeness, and I agree with the above.  - Ronen Bromwell, FNP-C.

## 2018-09-23 DIAGNOSIS — Z0100 Encounter for examination of eyes and vision without abnormal findings: Secondary | ICD-10-CM | POA: Diagnosis not present

## 2018-10-18 ENCOUNTER — Other Ambulatory Visit: Payer: Self-pay

## 2018-10-18 ENCOUNTER — Telehealth (INDEPENDENT_AMBULATORY_CARE_PROVIDER_SITE_OTHER): Payer: Medicare HMO | Admitting: Family Medicine

## 2018-10-18 ENCOUNTER — Encounter (INDEPENDENT_AMBULATORY_CARE_PROVIDER_SITE_OTHER): Payer: Self-pay | Admitting: Family Medicine

## 2018-10-18 ENCOUNTER — Encounter: Payer: Self-pay | Admitting: Family Medicine

## 2018-10-18 DIAGNOSIS — R5383 Other fatigue: Secondary | ICD-10-CM | POA: Diagnosis not present

## 2018-10-18 DIAGNOSIS — E669 Obesity, unspecified: Secondary | ICD-10-CM

## 2018-10-18 DIAGNOSIS — Z6831 Body mass index (BMI) 31.0-31.9, adult: Secondary | ICD-10-CM | POA: Diagnosis not present

## 2018-10-19 NOTE — Progress Notes (Signed)
Office: [phone]  /  Fax: [phone] TeleHealth Visit:  [place] has verbally consented to this TeleHealth visit today. The patient is located at home, the provider is located at the [employer] [facility] office. The participants in this visit include the listed provider and patient and any and all parties involved. The visit was conducted today via WebEx.  HPI:   Chief Complaint: OBESITY Lori Shaw is here to discuss her progress with her obesity treatment plan. She is on the Category 2 plan and is following her eating plan approximately 60 % of the time. She states she is exercising 0 minutes 0 times per week. Lori Shaw reports that she has gained 1 pound. Her weight is 185 pounds today. She is still recovering from a laparoscopic cholecystectomy. She admits fatigue. Lori Shaw is not eating all of her protein. Her appetite is not up to what it was prior to surgery.  We were unable to weigh the patient today for this TeleHealth visit. She feels as if she has gained weight since her last visit. She has lost 15 lbs since starting treatment with [name].  Fatigue Lori Shaw admits to fatigue and she feels she is not yet recovered from her laparoscopic cholecystectomy. She also has the diagnosis of chronic lymphocytic leukemia and her last follow up with oncology was 01/13/18.. She is not sleeping well due to anxiety about an upcoming eye appointment. Something abnormal was spotted in her eye.   ASSESSMENT AND PLAN:  Other fatigue  Class 1 obesity with serious comorbidity and body mass index (BMI) of 31.0 to 31.9 in adult, unspecified obesity type  PLAN:  Fatigue Lori Shaw will increase her protein. She will follow up with oncology if her fatigue does not improve in one month. Lori Shaw agrees to follow up with our clinic in 3 weeks.  Obesity Lori Shaw is currently in the action stage of change. As such, her goal is to continue with weight loss efforts She has agreed to keep  a food journal with 1200 to 1300 calories and 85 to 90 grams of protein daily or follow the Category 2 plan We discussed the following Behavioral Modification Strategies today: increasing lean protein intake, better snacking choices and planning for success Lori Shaw will add a protein snack during the day.  Lori Shaw has agreed to follow up with our clinic in 3 weeks. She was informed of the importance of frequent follow up visits to maximize her success with intensive lifestyle modifications for her multiple health conditions.  ALLERGIES: Allergies  Allergen Reactions  . Sulfa Antibiotics Nausea Only    MEDICATIONS: Current Outpatient Medications on File Prior to Visit  Medication Sig Dispense Refill  . cholecalciferol (VITAMIN D3) 25 MCG (1000 UT) tablet Take 2,000 Units by mouth daily.    . Cyanocobalamin (VITAMIN B-12) 2500 MCG SUBL Place 1 tablet (2,500 mcg total) under the tongue daily.  0  . fluticasone (FLONASE) 50 MCG/ACT nasal spray Place 2 sprays into both nostrils daily as needed. 16 g 11  . Vitamin D, Ergocalciferol, (DRISDOL) 1.25 MG (50000 UT) CAPS capsule Take 1 capsule (50,000 Units total) by mouth every 14 (fourteen) days. 4 capsule 0   No current facility-administered medications on file prior to visit.     PAST MEDICAL HISTORY: Past Medical History:  Diagnosis Date  . B12 deficiency   . Back pain   . CLL (chronic lymphocytic leukemia) ([place]) 06/21/2016   Rai stage 0, dx 06/2016 (Dr Janese Banks)  . HLD (hyperlipidemia)   . Joint  pain   . Knee problem   . OSA on CPAP 2006, 04/2014   AHI of 23.8 and an RDI of 42.9, on CPAP 9 cm H2O (D[facility]meier)  . Osteopenia 08/2010   T -1.4 at L femur neck  . Piriformis syndrome 12/14/2014  . Vitamin D deficiency     PAST SURGICAL HISTORY: Past Surgical History:  Procedure Laterality Date  . ABDOMINAL HYSTERECTOMY  01/27/1983   for menometrorrhagia, one ovary remained  . CHOLECYSTECTOMY N/A 08/01/2018   LAPAROSCOPIC CHOLECYSTECTOMY; for  gallstone pancreatitis  Celine Ahr, Anderson Malta, MD)  . COLONOSCOPY  2012   no records available. per patient normal.   . dexa  08/2010   osteopenia  . TONSILLECTOMY  01/26/1973    SOCIAL HISTORY: Social History   Tobacco Use  . Smoking status: Former Smoker    Packs/day: 2.00    Years: 20.00    Pack years: 40.00    Types: Cigarettes    Quit date: 01/26/1978    Years since quitting: 40.7  . Smokeless tobacco: Never Used  Substance Use Topics  . Alcohol use: No  . Drug use: Yes    Comment: occassionally glass of wine    FAMILY HISTORY: Family History  Problem Relation Age of Onset  . Hyperlipidemia Mother   . CAD Mother        CABG  . Hypertension Mother   . Hyperlipidemia Father   . CAD Father        MI  . Sudden death Father   . Arthritis Maternal Grandmother   . Cancer Sister        breast  . Breast cancer Sister 23  . Alzheimer's disease Other   . Heart attack Other   . Diabetes Neg Hx   . Stroke Neg Hx     ROS: Review of Systems  Constitutional: Positive for malaise/fatigue. Negative for weight loss.  Psychiatric/Behavioral: The patient has insomnia.     PHYSICAL EXAM: Pt in no acute distress  RECENT LABS AND TESTS: BMET    Component Value Date/Time   NA 139 08/02/2018 0513   NA 142 03/17/2018 1041   K 4.3 08/02/2018 0513   CL 105 08/02/2018 0513   CO2 26 08/02/2018 0513   GLUCOSE 164 (H) 08/02/2018 0513   BUN 7 (L) 08/02/2018 0513   BUN 16 03/17/2018 1041   CREATININE 0.76 08/02/2018 0513   CALCIUM 8.7 (L) 08/02/2018 0513   GFRNONAA >60 08/02/2018 0513   GFRAA >60 08/02/2018 0513   Lab Results  Component Value Date   HGBA1C 5.4 03/17/2018   HGBA1C 5.5 11/25/2017   Lab Results  Component Value Date   INSULIN 20.9 03/17/2018   INSULIN 6.6 11/25/2017   CBC    Component Value Date/Time   WBC 15.6 (H) 07/30/2018 0504   RBC 4.34 07/30/2018 0504   HGB 13.5 07/30/2018 0504   HGB 15.1 11/25/2017 1208   HGB 14.5 04/10/2010   HCT 41.7  07/30/2018 0504   HCT 44.8 11/25/2017 1208   PLT 177 07/30/2018 0504   MCV 96.1 07/30/2018 0504   MCV 92 11/25/2017 1208   MCH 31.1 07/30/2018 0504   MCHC 32.4 07/30/2018 0504   RDW 14.0 07/30/2018 0504   RDW 13.5 11/25/2017 1208   LYMPHSABS 10.8 (H) 07/28/2018 1323   LYMPHSABS 8.7 (H) 11/25/2017 1208   MONOABS 0.9 07/28/2018 1323   EOSABS 0.2 07/28/2018 1323   EOSABS 0.3 11/25/2017 1208   BASOSABS 0.1 07/28/2018 1323   BASOSABS 0.1  11/25/2017 1208   Iron/TIBC/Ferritin/ %Sat No results found for: IRON, TIBC, FERRITIN, IRONPCTSAT Lipid Panel     Component Value Date/Time   CHOL 247 (H) 07/15/2018 1553   CHOL 215 (H) 03/17/2018 1041   TRIG 239 (H) 07/15/2018 1553   TRIG 152 04/10/2010   HDL 53 07/15/2018 1553   HDL 45 03/17/2018 1041   CHOLHDL 4.7 07/15/2018 1553   VLDL 46.0 (H) 07/13/2017 1400   LDLCALC 153 (H) 07/15/2018 1553   LDLCALC 67 04/10/2010   LDLDIRECT 141.0 07/13/2017 1400   Hepatic Function Panel     Component Value Date/Time   PROT 6.3 (L) 08/02/2018 0513   PROT 6.5 03/17/2018 1041   ALBUMIN 3.8 08/02/2018 0513   ALBUMIN 4.6 03/17/2018 1041   AST 46 (H) 08/02/2018 0513   AST 18 04/10/2010   ALT 83 (H) 08/02/2018 0513   ALKPHOS 95 08/02/2018 0513   ALKPHOS 79 04/10/2010   BILITOT 0.2 (L) 08/02/2018 0513   BILITOT 0.5 03/17/2018 1041      Component Value Date/Time   TSH 2.510 11/25/2017 1208   TSH 3.46 06/19/2016 0808   TSH 1.28 09/24/2015 0831     Ref. Range 07/15/2018 15:53  Vitamin D, 25-Hydroxy Latest Ref Range: 30 - 100 ng/mL 52    I, Doreene Nest, am acting as Location manager for [employer], FNP-C   I have reviewed the above documentation for accuracy and completeness, and I agree with the above.  - Egidio Lofgren, FNP-C.

## 2018-10-24 MED ORDER — ROSUVASTATIN CALCIUM 10 MG PO TABS: 10.0000 mg | ORAL_TABLET | Freq: Every day | ORAL | 6 refills | Status: DC

## 2018-10-27 DIAGNOSIS — G4733 Obstructive sleep apnea (adult) (pediatric): Secondary | ICD-10-CM | POA: Diagnosis not present

## 2018-11-01 ENCOUNTER — Encounter: Payer: Self-pay | Admitting: Family Medicine

## 2018-11-01 DIAGNOSIS — D3131 Benign neoplasm of right choroid: Secondary | ICD-10-CM | POA: Insufficient documentation

## 2018-11-01 DIAGNOSIS — D3132 Benign neoplasm of left choroid: Secondary | ICD-10-CM | POA: Diagnosis not present

## 2018-11-01 DIAGNOSIS — H353112 Nonexudative age-related macular degeneration, right eye, intermediate dry stage: Secondary | ICD-10-CM | POA: Diagnosis not present

## 2018-11-01 DIAGNOSIS — H25813 Combined forms of age-related cataract, bilateral: Secondary | ICD-10-CM | POA: Diagnosis not present

## 2018-11-01 DIAGNOSIS — H353221 Exudative age-related macular degeneration, left eye, with active choroidal neovascularization: Secondary | ICD-10-CM | POA: Insufficient documentation

## 2018-11-08 ENCOUNTER — Ambulatory Visit: Payer: Medicare HMO | Admitting: Adult Health

## 2018-11-08 ENCOUNTER — Other Ambulatory Visit: Payer: Self-pay

## 2018-11-08 ENCOUNTER — Ambulatory Visit (INDEPENDENT_AMBULATORY_CARE_PROVIDER_SITE_OTHER): Payer: Medicare HMO | Admitting: Family Medicine

## 2018-11-08 VITALS — BP 101/66 | HR 86 | Temp 97.3°F | Wt 190.8 lb

## 2018-11-08 VITALS — BP 101/67 | HR 65 | Temp 97.7°F | Ht 65.0 in | Wt 186.0 lb

## 2018-11-08 DIAGNOSIS — Z6831 Body mass index (BMI) 31.0-31.9, adult: Secondary | ICD-10-CM | POA: Diagnosis not present

## 2018-11-08 DIAGNOSIS — G4733 Obstructive sleep apnea (adult) (pediatric): Secondary | ICD-10-CM

## 2018-11-08 DIAGNOSIS — G479 Sleep disorder, unspecified: Secondary | ICD-10-CM | POA: Diagnosis not present

## 2018-11-08 DIAGNOSIS — E559 Vitamin D deficiency, unspecified: Secondary | ICD-10-CM

## 2018-11-08 DIAGNOSIS — E669 Obesity, unspecified: Secondary | ICD-10-CM

## 2018-11-08 DIAGNOSIS — Z9989 Dependence on other enabling machines and devices: Secondary | ICD-10-CM | POA: Diagnosis not present

## 2018-11-08 DIAGNOSIS — R5383 Other fatigue: Secondary | ICD-10-CM | POA: Diagnosis not present

## 2018-11-08 MED ORDER — VITAMIN D (ERGOCALCIFEROL) 1.25 MG (50000 UNIT) PO CAPS: 50000.0000 [IU] | ORAL_CAPSULE | ORAL | 0 refills | Status: DC

## 2018-11-08 NOTE — Progress Notes (Signed)
PATIENT: [place] Linnemann DOB: 1945-11-21  REASON FOR VISIT: follow up HISTORY FROM: patient  HISTORY OF PRESENT ILLNESS: Today 11/08/18:  Ms. Guerrier is a 73 year old female with a history of obstructive sleep apnea on CPAP.  Her download indicates that she used her machine 30 out of 30 days for compliance of 100%.  She use her machine greater than 4 hours each night.  On average she uses her machine 8 hours and 54 minutes.  Her residual AHI is 1.2 on 9 cm of water with EPR 2.  She reports that the CPAP is working well for her.  She states that she has noticed that she has been having a hard time sleeping.  She does not think it is anything to do with the CPAP.  She feels that it is related to environmental changes specifically Covid.  She was wondering if she could try melatonin.   REVIEW OF SYSTEMS: Out of a complete 14 system review of symptoms, the patient complains only of the following symptoms, and all other reviewed systems are negative.  See HPI  ALLERGIES: Allergies  Allergen Reactions  . Sulfa Antibiotics Nausea Only    HOME MEDICATIONS: Outpatient Medications Prior to Visit  Medication Sig Dispense Refill  . cholecalciferol (VITAMIN D3) 25 MCG (1000 UT) tablet Take 2,000 Units by mouth daily.    . Cyanocobalamin (VITAMIN B-12) 2500 MCG SUBL Place 1 tablet (2,500 mcg total) under the tongue daily.  0  . fluticasone (FLONASE) 50 MCG/ACT nasal spray Place 2 sprays into both nostrils daily as needed. 16 g 11  . rosuvastatin (CRESTOR) 10 MG tablet Take 1 tablet (10 mg total) by mouth daily. (Patient taking differently: Take 5 mg by mouth daily. ) 30 tablet 6  . Vitamin D, Ergocalciferol, (DRISDOL) 1.25 MG (50000 UT) CAPS capsule Take 1 capsule (50,000 Units total) by mouth every 14 (fourteen) days. 4 capsule 0   No facility-administered medications prior to visit.     PAST MEDICAL HISTORY: Past Medical History:  Diagnosis Date  . B12 deficiency   . Back pain    . CLL (chronic lymphocytic leukemia) ([place]) 06/21/2016   Rai stage 0, dx 06/2016 (Dr Janese Banks)  . HLD (hyperlipidemia)   . Joint pain   . Knee problem   . OSA on CPAP 2006, 04/2014   AHI of 23.8 and an RDI of 42.9, on CPAP 9 cm H2O (Dohmeier)  . Osteopenia 08/2010   T -1.4 at L femur neck  . Piriformis syndrome 12/14/2014  . Vitamin D deficiency     PAST SURGICAL HISTORY: Past Surgical History:  Procedure Laterality Date  . ABDOMINAL HYSTERECTOMY  01/27/1983   for menometrorrhagia, one ovary remained  . CHOLECYSTECTOMY N/A 08/01/2018   LAPAROSCOPIC CHOLECYSTECTOMY; for gallstone pancreatitis  Celine Ahr, Anderson Malta, MD)  . COLONOSCOPY  2012   no records available. per patient normal.   . dexa  08/2010   osteopenia  . TONSILLECTOMY  01/26/1973    FAMILY HISTORY: Family History  Problem Relation Age of Onset  . Hyperlipidemia Mother   . CAD Mother        CABG  . Hypertension Mother   . Hyperlipidemia Father   . CAD Father        MI  . Sudden death Father   . Arthritis Maternal Grandmother   . Cancer Sister        breast  . Breast cancer Sister 77  . Alzheimer's disease Other   . Heart attack  Other   . Diabetes Neg Hx   . Stroke Neg Hx     SOCIAL HISTORY: Social History   Socioeconomic History  . Marital status: Married    Spouse name: Aaralyn Pedicini  . Number of children: 2  . Years of education: 1.5  . Highest education level: Not on file  Occupational History  . 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 Financial resource strain: Not on file  . Food insecurity    Worry: Not on file    Inability: Not on file  . Transportation needs    Medical: Not on file    Non-medical: Not on file  Tobacco Use  . Smoking status: Former Smoker    Packs/day: 2.00    Years: 20.00    Pack years: 40.00    Types: Cigarettes    Quit date: 01/26/1978    Years since quitting: 40.8  . Smokeless tobacco: Never Used  Substance and Sexual Activity  .  Alcohol use: No  . Drug use: Yes    Comment: occassionally glass of wine  . Sexual activity: Yes  Lifestyle  . Physical activity    Days per week: Not on file    Minutes per session: Not on file  . Stress: Not on file  Relationships  . Social Herbalist on phone: Not on file    Gets together: Not on file    Attends religious service: Not on file    Active member of club or organization: Not on file    Attends meetings of clubs or organizations: Not on file    Relationship status: Not on file  . Intimate partner violence    Fear of current or ex partner: Not on file    Emotionally abused: Not on file    Physically abused: Not on file    Forced sexual activity: Not on file  Other Topics Concern  . Not on file  Social History Narrative   Lives with daughter, 1 dog   Occupation: retired, has worked in Chief Strategy Officer, also Forensic psychologist   Edu: 1.5 yrs college   Activity: square dancing   Diet: good water, fruits/vegetables daily   Patient drinks two cups of caffeine daily.   Remarried.        PHYSICAL EXAM  Vitals:   11/08/18 1317  BP: 101/66  Pulse: 86  Temp: (!) 97.3 F (36.3 C)  TempSrc: Oral  Weight: 190 lb 12.8 oz (86.5 kg)   Body mass index is 31.75 kg/m.  Generalized: Well developed, in no acute distress  Chest: Lungs clear to auscultation bilaterally  Neurological examination  Mentation: Alert oriented to time, place, history taking. Follows all commands speech and language fluent Cranial nerve II-XII: Extraocular movements were full, visual field were full on confrontational test Head turning and shoulder shrug  were normal and symmetric. Motor: The motor testing reveals 5 over 5 strength of all 4 extremities. Good symmetric motor tone is noted throughout.  Sensory: Sensory testing is intact to soft touch on all 4 extremities. No evidence of extinction is noted.  Gait and station: Gait is normal.    DIAGNOSTIC DATA (LABS, IMAGING, TESTING)  - I reviewed patient records, labs, notes, testing and imaging myself where available.  Lab Results  Component Value Date   WBC 15.6 (H) 07/30/2018   HGB 13.5 07/30/2018   HCT 41.7 07/30/2018   MCV 96.1 07/30/2018   PLT 177 07/30/2018  Component Value Date/Time   NA 139 08/02/2018 0513   NA 142 03/17/2018 1041   K 4.3 08/02/2018 0513   CL 105 08/02/2018 0513   CO2 26 08/02/2018 0513   GLUCOSE 164 (H) 08/02/2018 0513   BUN 7 (L) 08/02/2018 0513   BUN 16 03/17/2018 1041   CREATININE 0.76 08/02/2018 0513   CALCIUM 8.7 (L) 08/02/2018 0513   PROT 6.3 (L) 08/02/2018 0513   PROT 6.5 03/17/2018 1041   ALBUMIN 3.8 08/02/2018 0513   ALBUMIN 4.6 03/17/2018 1041   AST 46 (H) 08/02/2018 0513   AST 18 04/10/2010   ALT 83 (H) 08/02/2018 0513   ALKPHOS 95 08/02/2018 0513   ALKPHOS 79 04/10/2010   BILITOT 0.2 (L) 08/02/2018 0513   BILITOT 0.5 03/17/2018 1041   GFRNONAA >60 08/02/2018 0513   GFRAA >60 08/02/2018 0513   Lab Results  Component Value Date   CHOL 247 (H) 07/15/2018   HDL 53 07/15/2018   LDLCALC 153 (H) 07/15/2018   LDLDIRECT 141.0 07/13/2017   TRIG 239 (H) 07/15/2018   CHOLHDL 4.7 07/15/2018   Lab Results  Component Value Date   HGBA1C 5.4 03/17/2018   Lab Results  Component Value Date   VITAMINB12 491 07/15/2018   Lab Results  Component Value Date   TSH 2.510 11/25/2017      ASSESSMENT AND PLAN 73 y.o. year old female  has a past medical history of B12 deficiency, Back pain, CLL (chronic lymphocytic leukemia) ([place]) (06/21/2016), HLD (hyperlipidemia), Joint pain, Knee problem, OSA on CPAP (2006, 04/2014), Osteopenia (08/2010), Piriformis syndrome (12/14/2014), and Vitamin D deficiency. here with:  1. Obstructive sleep apnea on CPAP 2. Sleep disturbance  Patient CPAP download shows excellent compliance and good treatment of her apnea.  She is encouraged to continue using CPAP nightly and greater than 4 hours each night.  She was advised that she could  take melatonin 1 to 5 mg 2 hours before bedtime.  She is advised that if her symptoms worsen or she develops new symptoms she should let us know.  She will follow-up in 1 year or sooner if needed   I spent 15 minutes with the patient. 50% of this time was spent reviewing CPAP download   Ward Givens, MSN, NP-C 11/08/2018, 1:44 PM [facility] [address], [place], [place] [address] [phone]

## 2018-11-08 NOTE — Patient Instructions (Signed)
Continue using CPAP nightly and greater than 4 hours each night Can try melatonin 1-5 mg 2 hours before bedtime If your symptoms worsen or you develop new symptoms please let us know.

## 2018-11-10 NOTE — Progress Notes (Signed)
Office: [phone]  /  Fax: [phone]   HPI:   Chief Complaint: Lori Shaw is here to discuss her progress with her obesity treatment plan. She is on the Category 2 plan and is following her eating plan approximately 50 % of the time. She states she is exercising 0 minutes 0 times per week. Lori Shaw's appetite is not good and she is skipping meals. Lori Shaw is not able to eat at times because eating makes her feel nauseated. Her weight is 186 lb (84.4 kg) today and has had a weight loss of 2 pounds since her last in-office visit. She has lost 17 lbs since starting treatment with [name].  Fatigue Lori Shaw is very tired and she feels this is related to surgery thispast summer and stress. She does worry it could be a reoccurrence of CLL (chronic lymphocytic leukemia). Lori Shaw is positive for hot flashes.   Vitamin D deficiency Lori Shaw has a diagnosis of vitamin D deficiency and her vitamin D level is not at goal. She is on prescription vit D every other week and she is also on OTC vitamin D 2,000 IU daily. Lori Shaw denies nausea, vomiting or muscle weakness.  ASSESSMENT AND PLAN:  Other fatigue  Vitamin D deficiency - Plan: Vitamin D, Ergocalciferol, (DRISDOL) 1.25 MG (50000 UT) CAPS capsule  Class 1 obesity with serious comorbidity and body mass index (BMI) of 31.0 to 31.9 in adult, unspecified obesity type  PLAN:  Fatigue Lori Shaw was informed that her fatigue may be related to obesity, depression or many other causes. Lori Shaw has an appointment with oncology in December 2020. She will continue to work on diet and weight loss to help with fatigue. Proper sleep hygiene was discussed including the need for 7-8 hours of quality sleep each night.  Vitamin D Deficiency Lori Shaw was informed that low vitamin D levels contributes to fatigue and are associated with obesity, breast, and colon cancer. Lori Shaw agrees to continue to take prescription Vit D @50 ,000 IU every other week  #4 with no refills and OTC vitamin D. She will follow up for routine testing of vitamin D, at least 2-3 times per year. She was informed of the risk of over-replacement of vitamin D and agrees to not increase her dose unless she discusses this with [name] first. Lori Shaw agrees to follow up with our clinic in 4 weeks.  Obesity Lori Shaw is currently in the action stage of change. As such, her goal is to continue with weight loss efforts She has agreed to follow the Category 2 plan We discussed the following Behavioral Modification Strategies today: planning for success, no skipping meals and increasing lean protein intake  Lori Shaw can have a protein shake when she is not able to eat Acupuncturist).  Lori Shaw has agreed to follow up with our clinic in 4 weeks. She was informed of the importance of frequent follow up visits to maximize her success with intensive lifestyle modifications for her multiple health conditions.  ALLERGIES: Allergies  Allergen Reactions  . Sulfa Antibiotics Nausea Only    MEDICATIONS: Current Outpatient Medications on File Prior to Visit  Medication Sig Dispense Refill  . cholecalciferol (VITAMIN D3) 25 MCG (1000 UT) tablet Take 2,000 Units by mouth daily.    . Cyanocobalamin (VITAMIN B-12) 2500 MCG SUBL Place 1 tablet (2,500 mcg total) under the tongue daily.  0  . fluticasone (FLONASE) 50 MCG/ACT nasal spray Place 2 sprays into both nostrils daily as needed. 16 g 11  . rosuvastatin (CRESTOR) 10 MG  tablet Take 1 tablet (10 mg total) by mouth daily. (Patient taking differently: Take 5 mg by mouth daily. ) 30 tablet 6   No current facility-administered medications on file prior to visit.     PAST MEDICAL HISTORY: Past Medical History:  Diagnosis Date  . B12 deficiency   . Back pain   . CLL (chronic lymphocytic leukemia) ([place]) 06/21/2016   Rai stage 0, dx 06/2016 (Dr Janese Banks)  . HLD (hyperlipidemia)   . Joint pain   . Knee problem   . OSA on CPAP 2006,  04/2014   AHI of 23.8 and an RDI of 42.9, on CPAP 9 cm H2O (Dohmeier)  . Osteopenia 08/2010   T -1.4 at L femur neck  . Piriformis syndrome 12/14/2014  . Vitamin D deficiency     PAST SURGICAL HISTORY: Past Surgical History:  Procedure Laterality Date  . ABDOMINAL HYSTERECTOMY  01/27/1983   for menometrorrhagia, one ovary remained  . CHOLECYSTECTOMY N/A 08/01/2018   LAPAROSCOPIC CHOLECYSTECTOMY; for gallstone pancreatitis  Celine Ahr, Anderson Malta, MD)  . COLONOSCOPY  2012   no records available. per patient normal.   . dexa  08/2010   osteopenia  . TONSILLECTOMY  01/26/1973    SOCIAL HISTORY: Social History   Tobacco Use  . Smoking status: Former Smoker    Packs/day: 2.00    Years: 20.00    Pack years: 40.00    Types: Cigarettes    Quit date: 01/26/1978    Years since quitting: 40.8  . Smokeless tobacco: Never Used  Substance Use Topics  . Alcohol use: No  . Drug use: Yes    Comment: occassionally glass of wine    FAMILY HISTORY: Family History  Problem Relation Age of Onset  . Hyperlipidemia Mother   . CAD Mother        CABG  . Hypertension Mother   . Hyperlipidemia Father   . CAD Father        MI  . Sudden death Father   . Arthritis Maternal Grandmother   . Cancer Sister        breast  . Breast cancer Sister 34  . Alzheimer's disease Other   . Heart attack Other   . Diabetes Neg Hx   . Stroke Neg Hx     ROS: Review of Systems  Constitutional: Positive for malaise/fatigue and weight loss.       Positive for hot flashes  Gastrointestinal: Negative for nausea and vomiting.  Musculoskeletal:       Negative for muscle weakness    PHYSICAL EXAM: Blood pressure 101/67, pulse 65, temperature 97.7 F (36.5 C), temperature source Oral, height 5\' 5"  (1.651 m), weight 186 lb (84.4 kg), SpO2 99 %. Body mass index is 30.95 kg/m. Physical Exam Vitals signs reviewed.  Constitutional:      Appearance: Normal appearance. She is well-developed. She is obese.   Cardiovascular:     Rate and Rhythm: Normal rate.  Pulmonary:     Effort: Pulmonary effort is normal.  Musculoskeletal: Normal range of motion.  Skin:    General: Skin is warm and dry.  Neurological:     Mental Status: She is alert and oriented to person, place, and time.  Psychiatric:        Mood and Affect: Mood normal.        Behavior: Behavior normal.     RECENT LABS AND TESTS: BMET    Component Value Date/Time   NA 139 08/02/2018 0513   NA 142 03/17/2018  1041   K 4.3 08/02/2018 0513   CL 105 08/02/2018 0513   CO2 26 08/02/2018 0513   GLUCOSE 164 (H) 08/02/2018 0513   BUN 7 (L) 08/02/2018 0513   BUN 16 03/17/2018 1041   CREATININE 0.76 08/02/2018 0513   CALCIUM 8.7 (L) 08/02/2018 0513   GFRNONAA >60 08/02/2018 0513   GFRAA >60 08/02/2018 0513   Lab Results  Component Value Date   HGBA1C 5.4 03/17/2018   HGBA1C 5.5 11/25/2017   Lab Results  Component Value Date   INSULIN 20.9 03/17/2018   INSULIN 6.6 11/25/2017   CBC    Component Value Date/Time   WBC 15.6 (H) 07/30/2018 0504   RBC 4.34 07/30/2018 0504   HGB 13.5 07/30/2018 0504   HGB 15.1 11/25/2017 1208   HGB 14.5 04/10/2010   HCT 41.7 07/30/2018 0504   HCT 44.8 11/25/2017 1208   PLT 177 07/30/2018 0504   MCV 96.1 07/30/2018 0504   MCV 92 11/25/2017 1208   MCH 31.1 07/30/2018 0504   MCHC 32.4 07/30/2018 0504   RDW 14.0 07/30/2018 0504   RDW 13.5 11/25/2017 1208   LYMPHSABS 10.8 (H) 07/28/2018 1323   LYMPHSABS 8.7 (H) 11/25/2017 1208   MONOABS 0.9 07/28/2018 1323   EOSABS 0.2 07/28/2018 1323   EOSABS 0.3 11/25/2017 1208   BASOSABS 0.1 07/28/2018 1323   BASOSABS 0.1 11/25/2017 1208   Iron/TIBC/Ferritin/ %Sat No results found for: IRON, TIBC, FERRITIN, IRONPCTSAT Lipid Panel     Component Value Date/Time   CHOL 247 (H) 07/15/2018 1553   CHOL 215 (H) 03/17/2018 1041   TRIG 239 (H) 07/15/2018 1553   TRIG 152 04/10/2010   HDL 53 07/15/2018 1553   HDL 45 03/17/2018 1041   CHOLHDL 4.7  07/15/2018 1553   VLDL 46.0 (H) 07/13/2017 1400   LDLCALC 153 (H) 07/15/2018 1553   LDLCALC 67 04/10/2010   LDLDIRECT 141.0 07/13/2017 1400   Hepatic Function Panel     Component Value Date/Time   PROT 6.3 (L) 08/02/2018 0513   PROT 6.5 03/17/2018 1041   ALBUMIN 3.8 08/02/2018 0513   ALBUMIN 4.6 03/17/2018 1041   AST 46 (H) 08/02/2018 0513   AST 18 04/10/2010   ALT 83 (H) 08/02/2018 0513   ALKPHOS 95 08/02/2018 0513   ALKPHOS 79 04/10/2010   BILITOT 0.2 (L) 08/02/2018 0513   BILITOT 0.5 03/17/2018 1041      Component Value Date/Time   TSH 2.510 11/25/2017 1208   TSH 3.46 06/19/2016 0808   TSH 1.28 09/24/2015 0831     Ref. Range 07/15/2018 15:53  Vitamin D, 25-Hydroxy Latest Ref Range: 30 - 100 ng/mL 52    OBESITY BEHAVIORAL INTERVENTION VISIT  Today's visit was # 18   Starting weight: 203 lbs Starting date: 11/25/2017 Today's weight : 186 lbs Today's date: 11/08/2018 Total lbs lost to date: 17    11/08/2018 0800  Height 5\' 5"  (1.651 m)  Weight 186 lb (84.4 kg)  BMI (Calculated) 30.95   Body Fat % 46.5 %  Total Body Water (lbs) 69.4 lbs     11/08/2018  BLOOD PRESSURE - SYSTOLIC [id number]  BLOOD PRESSURE - DIASTOLIC 67    ASK: We discussed the diagnosis of obesity with Lori Shaw today and Lori Shaw agreed to give [name] permission to discuss obesity behavioral modification therapy today.  ASSESS: Lori Shaw has the diagnosis of obesity and her BMI today is 48.95 Lori Shaw is in the action stage of change   ADVISE: Lori Shaw was educated on  the multiple health risks of obesity as well as the benefit of weight loss to improve her health. She was advised of the need for long term treatment and the importance of lifestyle modifications to improve her current health and to decrease her risk of future health problems.  AGREE: Multiple dietary modification options and treatment options were discussed and  Lori Shaw agreed to follow the recommendations documented  in the above note.  ARRANGE: Lori Shaw was educated on the importance of frequent visits to treat obesity as outlined per CMS and USPSTF guidelines and agreed to schedule her next follow up appointment today.  I, Doreene Nest, am acting as transcriptionist for [employer], FNP-C  I have reviewed the above documentation for accuracy and completeness, and I agree with the above.  - Merrianne Mccumbers, FNP-C.

## 2018-11-14 ENCOUNTER — Encounter (INDEPENDENT_AMBULATORY_CARE_PROVIDER_SITE_OTHER): Payer: Self-pay | Admitting: Family Medicine

## 2018-11-27 DIAGNOSIS — G4733 Obstructive sleep apnea (adult) (pediatric): Secondary | ICD-10-CM | POA: Diagnosis not present

## 2018-11-29 DIAGNOSIS — R69 Illness, unspecified: Secondary | ICD-10-CM | POA: Diagnosis not present

## 2018-12-06 ENCOUNTER — Other Ambulatory Visit: Payer: Self-pay

## 2018-12-06 ENCOUNTER — Ambulatory Visit (INDEPENDENT_AMBULATORY_CARE_PROVIDER_SITE_OTHER): Payer: Medicare HMO | Admitting: Family Medicine

## 2018-12-06 ENCOUNTER — Encounter (INDEPENDENT_AMBULATORY_CARE_PROVIDER_SITE_OTHER): Payer: Self-pay | Admitting: Family Medicine

## 2018-12-06 VITALS — BP 120/75 | HR 77 | Temp 98.4°F | Ht 65.0 in | Wt 185.0 lb

## 2018-12-06 DIAGNOSIS — Z6830 Body mass index (BMI) 30.0-30.9, adult: Secondary | ICD-10-CM

## 2018-12-06 DIAGNOSIS — E669 Obesity, unspecified: Secondary | ICD-10-CM | POA: Diagnosis not present

## 2018-12-06 DIAGNOSIS — E559 Vitamin D deficiency, unspecified: Secondary | ICD-10-CM

## 2018-12-06 NOTE — Progress Notes (Signed)
Office: [phone]  /  Fax: [phone]   HPI:   Chief Complaint: Lori Shaw is here to discuss her progress with her obesity treatment plan. She is on the Category 2 plan and is following her eating plan approximately 50 to 60 % of the time. She states she is exercising 0 minutes 0 times per week. [name] has been out to eat at the [employer] one night. She has also had pinto beans for dinner one night. She sticks to the plan well at breakfast and lunch. Her weight is 185 lb (83.9 kg) today and has had a weight loss of 1 pound over a period of 4 weeks since her last visit. She has lost 18 lbs since starting treatment with [name].  Vitamin D deficiency [name] has a diagnosis of vitamin D deficiency. She admits fatigue. [name] is currently taking OTC vit D and prescription Vitamin D every 14 days. Her fatigue is improving however and she recently added melatonin, which is helping. She denies nausea, vomiting or muscle weakness.  ASSESSMENT AND PLAN:  Vitamin D deficiency  Class 1 obesity with serious comorbidity and body mass index (BMI) of 30.0 to 30.9 in adult, unspecified obesity type  PLAN:  Vitamin D Deficiency [name] was informed that low vitamin D levels contributes to fatigue and are associated with obesity, breast, and colon cancer. [name] will continue to take OTC vitamin D 2,000 IU daily and prescription Vit D @50 ,000 IU every other week and she will follow up for routine testing of vitamin D, at least 2-3 times per year. She was informed of the risk of over-replacement of vitamin D and agrees to not increase her dose unless she discusses this with [name] first.  Obesity [name] is currently in the action stage of change. As such, her goal is to continue with weight loss efforts She has agreed to follow the Category 2 plan [name] will walk for 10 minutes, 2 times per day for weight loss and overall health benefits. We discussed the following Behavioral  Modification Strategies today: planning for success  [name] has agreed to follow up with our clinic in 3 weeks. She was informed of the importance of frequent follow up visits to maximize her success with intensive lifestyle modifications for her multiple health conditions.  ALLERGIES: Allergies  Allergen Reactions  . Sulfa Antibiotics Nausea Only    MEDICATIONS: Current Outpatient Medications on File Prior to Visit  Medication Sig Dispense Refill  . cholecalciferol (VITAMIN D3) 25 MCG (1000 UT) tablet Take 2,000 Units by mouth daily.    . Cyanocobalamin (VITAMIN B-12) 2500 MCG SUBL Place 1 tablet (2,500 mcg total) under the tongue daily.  0  . fluticasone (FLONASE) 50 MCG/ACT nasal spray Place 2 sprays into both nostrils daily as needed. 16 g 11  . rosuvastatin (CRESTOR) 10 MG tablet Take 1 tablet (10 mg total) by mouth daily. (Patient taking differently: Take 5 mg by mouth daily. ) 30 tablet 6  . Vitamin D, Ergocalciferol, (DRISDOL) 1.25 MG (50000 UT) CAPS capsule Take 1 capsule (50,000 Units total) by mouth every 14 (fourteen) days. 4 capsule 0   No current facility-administered medications on file prior to visit.     PAST MEDICAL HISTORY: Past Medical History:  Diagnosis Date  . B12 deficiency   . Back pain   . CLL (chronic lymphocytic leukemia) ([place]) 06/21/2016   Rai stage 0, dx 06/2016 (Dr Janese Banks)  . HLD (hyperlipidemia)   . Joint pain   . Knee problem   .  OSA on CPAP 2006, 04/2014   AHI of 23.8 and an RDI of 42.9, on CPAP 9 cm H2O (Dohmeier)  . Osteopenia 08/2010   T -1.4 at L femur neck  . Piriformis syndrome 12/14/2014  . Vitamin D deficiency     PAST SURGICAL HISTORY: Past Surgical History:  Procedure Laterality Date  . ABDOMINAL HYSTERECTOMY  01/27/1983   for menometrorrhagia, one ovary remained  . CHOLECYSTECTOMY N/A 08/01/2018   LAPAROSCOPIC CHOLECYSTECTOMY; for gallstone pancreatitis  Celine Ahr, Anderson Malta, MD)  . COLONOSCOPY  2012   no records available. per  patient normal.   . dexa  08/2010   osteopenia  . TONSILLECTOMY  01/26/1973    SOCIAL HISTORY: Social History   Tobacco Use  . Smoking status: Former Smoker    Packs/day: 2.00    Years: 20.00    Pack years: 40.00    Types: Cigarettes    Quit date: 01/26/1978    Years since quitting: 40.8  . Smokeless tobacco: Never Used  Substance Use Topics  . Alcohol use: No  . Drug use: Yes    Comment: occassionally glass of wine    FAMILY HISTORY: Family History  Problem Relation Age of Onset  . Hyperlipidemia Mother   . CAD Mother        CABG  . Hypertension Mother   . Hyperlipidemia Father   . CAD Father        MI  . Sudden death Father   . Arthritis Maternal Grandmother   . Cancer Sister        breast  . Breast cancer Sister 55  . Alzheimer's disease Other   . Heart attack Other   . Diabetes Neg Hx   . Stroke Neg Hx     ROS: Review of Systems  Constitutional: Positive for malaise/fatigue and weight loss.  Gastrointestinal: Negative for nausea and vomiting.  Musculoskeletal:       Negative for muscle weakness    PHYSICAL EXAM: Blood pressure 120/75, pulse 77, temperature 98.4 F (36.9 C), temperature source Oral, height 5\' 5"  (1.651 m), weight 185 lb (83.9 kg), SpO2 98 %. Body mass index is 30.79 kg/m. Physical Exam Vitals signs reviewed.  Constitutional:      Appearance: Normal appearance. She is well-developed. She is obese.  Cardiovascular:     Rate and Rhythm: Normal rate.  Pulmonary:     Effort: Pulmonary effort is normal.  Musculoskeletal: Normal range of motion.  Skin:    General: Skin is warm and dry.  Neurological:     Mental Status: She is alert and oriented to person, place, and time.  Psychiatric:        Mood and Affect: Mood normal.        Behavior: Behavior normal.     RECENT LABS AND TESTS: BMET    Component Value Date/Time   NA 139 08/02/2018 0513   NA 142 03/17/2018 1041   K 4.3 08/02/2018 0513   CL 105 08/02/2018 0513   CO2 26  08/02/2018 0513   GLUCOSE 164 (H) 08/02/2018 0513   BUN 7 (L) 08/02/2018 0513   BUN 16 03/17/2018 1041   CREATININE 0.76 08/02/2018 0513   CALCIUM 8.7 (L) 08/02/2018 0513   GFRNONAA >60 08/02/2018 0513   GFRAA >60 08/02/2018 0513   Lab Results  Component Value Date   HGBA1C 5.4 03/17/2018   HGBA1C 5.5 11/25/2017   Lab Results  Component Value Date   INSULIN 20.9 03/17/2018   INSULIN 6.6 11/25/2017  CBC    Component Value Date/Time   WBC 15.6 (H) 07/30/2018 0504   RBC 4.34 07/30/2018 0504   HGB 13.5 07/30/2018 0504   HGB 15.1 11/25/2017 1208   HGB 14.5 04/10/2010   HCT 41.7 07/30/2018 0504   HCT 44.8 11/25/2017 1208   PLT 177 07/30/2018 0504   MCV 96.1 07/30/2018 0504   MCV 92 11/25/2017 1208   MCH 31.1 07/30/2018 0504   MCHC 32.4 07/30/2018 0504   RDW 14.0 07/30/2018 0504   RDW 13.5 11/25/2017 1208   LYMPHSABS 10.8 (H) 07/28/2018 1323   LYMPHSABS 8.7 (H) 11/25/2017 1208   MONOABS 0.9 07/28/2018 1323   EOSABS 0.2 07/28/2018 1323   EOSABS 0.3 11/25/2017 1208   BASOSABS 0.1 07/28/2018 1323   BASOSABS 0.1 11/25/2017 1208   Iron/TIBC/Ferritin/ %Sat No results found for: IRON, TIBC, FERRITIN, IRONPCTSAT Lipid Panel     Component Value Date/Time   CHOL 247 (H) 07/15/2018 1553   CHOL 215 (H) 03/17/2018 1041   TRIG 239 (H) 07/15/2018 1553   TRIG 152 04/10/2010   HDL 53 07/15/2018 1553   HDL 45 03/17/2018 1041   CHOLHDL 4.7 07/15/2018 1553   VLDL 46.0 (H) 07/13/2017 1400   LDLCALC 153 (H) 07/15/2018 1553   LDLCALC 67 04/10/2010   LDLDIRECT 141.0 07/13/2017 1400   Hepatic Function Panel     Component Value Date/Time   PROT 6.3 (L) 08/02/2018 0513   PROT 6.5 03/17/2018 1041   ALBUMIN 3.8 08/02/2018 0513   ALBUMIN 4.6 03/17/2018 1041   AST 46 (H) 08/02/2018 0513   AST 18 04/10/2010   ALT 83 (H) 08/02/2018 0513   ALKPHOS 95 08/02/2018 0513   ALKPHOS 79 04/10/2010   BILITOT 0.2 (L) 08/02/2018 0513   BILITOT 0.5 03/17/2018 1041      Component Value  Date/Time   TSH 2.510 11/25/2017 1208   TSH 3.46 06/19/2016 0808   TSH 1.28 09/24/2015 0831    Results for JADEMARIE, BORTON (MRN [id number]) as of 12/06/2018 16:32  Ref. Range 07/15/2018 15:53  Vitamin D, 25-Hydroxy Latest Ref Range: 30 - 100 ng/mL 52    OBESITY BEHAVIORAL INTERVENTION VISIT  Today's visit was # 19   Starting weight: 203 lbs Starting date: 11/25/2017 Today's weight : 185 lbs Today's date: 12/06/2018 Total lbs lost to date: 18    12/06/2018  Height 5\' 5"  (1.651 m)  Weight 185 lb (83.9 kg)  BMI (Calculated) 30.79  BLOOD PRESSURE - SYSTOLIC 123456  BLOOD PRESSURE - DIASTOLIC 75   Body Fat % 123456 %  Total Body Water (lbs) 67.2 lbs    ASK: We discussed the diagnosis of obesity with [place] today and [name] agreed to give [name] permission to discuss obesity behavioral modification therapy today.  ASSESS: [name] has the diagnosis of obesity and her BMI today is 30.79 [name] is in the action stage of change   ADVISE: [name] was educated on the multiple health risks of obesity as well as the benefit of weight loss to improve her health. She was advised of the need for long term treatment and the importance of lifestyle modifications to improve her current health and to decrease her risk of future health problems.  AGREE: Multiple dietary modification options and treatment options were discussed and  [name] agreed to follow the recommendations documented in the above note.  ARRANGE: [name] was educated on the importance of frequent visits to treat obesity as outlined per CMS and USPSTF guidelines and agreed to schedule  her next follow up appointment today.  I, Doreene Nest, am acting as transcriptionist for [employer], FNP-C  I have reviewed the above documentation for accuracy and completeness, and I agree with the above.  -  , FNP-C.

## 2018-12-13 ENCOUNTER — Encounter (INDEPENDENT_AMBULATORY_CARE_PROVIDER_SITE_OTHER): Payer: Self-pay | Admitting: Family Medicine

## 2018-12-15 DIAGNOSIS — H25813 Combined forms of age-related cataract, bilateral: Secondary | ICD-10-CM | POA: Diagnosis not present

## 2018-12-15 DIAGNOSIS — H353221 Exudative age-related macular degeneration, left eye, with active choroidal neovascularization: Secondary | ICD-10-CM | POA: Diagnosis not present

## 2018-12-15 DIAGNOSIS — H353112 Nonexudative age-related macular degeneration, right eye, intermediate dry stage: Secondary | ICD-10-CM | POA: Diagnosis not present

## 2018-12-15 DIAGNOSIS — D3131 Benign neoplasm of right choroid: Secondary | ICD-10-CM | POA: Diagnosis not present

## 2018-12-15 DIAGNOSIS — D3132 Benign neoplasm of left choroid: Secondary | ICD-10-CM | POA: Diagnosis not present

## 2018-12-27 DIAGNOSIS — G4733 Obstructive sleep apnea (adult) (pediatric): Secondary | ICD-10-CM | POA: Diagnosis not present

## 2019-01-03 ENCOUNTER — Ambulatory Visit (INDEPENDENT_AMBULATORY_CARE_PROVIDER_SITE_OTHER): Payer: Medicare HMO | Admitting: Family Medicine

## 2019-01-05 ENCOUNTER — Other Ambulatory Visit: Payer: Self-pay

## 2019-01-05 ENCOUNTER — Inpatient Hospital Stay: Payer: Medicare HMO | Attending: Oncology

## 2019-01-05 DIAGNOSIS — C9110 Chronic lymphocytic leukemia of B-cell type not having achieved remission: Secondary | ICD-10-CM | POA: Diagnosis not present

## 2019-01-05 LAB — CBC WITH DIFFERENTIAL/PLATELET
Abs Immature Granulocytes: 0.05 10*3/uL (ref 0.00–0.07)
Basophils Absolute: 0.1 10*3/uL (ref 0.0–0.1)
Basophils Relative: 1 %
Eosinophils Absolute: 0.2 10*3/uL (ref 0.0–0.5)
Eosinophils Relative: 1 %
HCT: 43.7 % (ref 36.0–46.0)
Hemoglobin: 14.2 g/dL (ref 12.0–15.0)
Immature Granulocytes: 0 %
Lymphocytes Relative: 70 %
Lymphs Abs: 14.4 10*3/uL — ABNORMAL HIGH (ref 0.7–4.0)
MCH: 30.9 pg (ref 26.0–34.0)
MCHC: 32.5 g/dL (ref 30.0–36.0)
MCV: 95.2 fL (ref 80.0–100.0)
Monocytes Absolute: 1.3 10*3/uL — ABNORMAL HIGH (ref 0.1–1.0)
Monocytes Relative: 6 %
Neutro Abs: 4.7 10*3/uL (ref 1.7–7.7)
Neutrophils Relative %: 22 %
Platelets: 205 10*3/uL (ref 150–400)
RBC: 4.59 MIL/uL (ref 3.87–5.11)
RDW: 13.6 % (ref 11.5–15.5)
WBC Morphology: REACTIVE
WBC: 20.7 10*3/uL — ABNORMAL HIGH (ref 4.0–10.5)
nRBC: 0 % (ref 0.0–0.2)

## 2019-01-05 LAB — COMPREHENSIVE METABOLIC PANEL
ALT: 16 U/L (ref 0–44)
AST: 17 U/L (ref 15–41)
Albumin: 4.3 g/dL (ref 3.5–5.0)
Alkaline Phosphatase: 60 U/L (ref 38–126)
Anion gap: 8 (ref 5–15)
BUN: 14 mg/dL (ref 8–23)
CO2: 25 mmol/L (ref 22–32)
Calcium: 9 mg/dL (ref 8.9–10.3)
Chloride: 108 mmol/L (ref 98–111)
Creatinine, Ser: 0.73 mg/dL (ref 0.44–1.00)
GFR calc Af Amer: >60 mL/min (ref >60)
GFR calc non Af Amer: >60 mL/min (ref >60)
Glucose, Bld: 88 mg/dL (ref 70–99)
Potassium: 4.2 mmol/L (ref 3.5–5.1)
Sodium: 141 mmol/L (ref 135–145)
Total Bilirubin: 0.5 mg/dL (ref 0.3–1.2)
Total Protein: 6.8 g/dL (ref 6.5–8.1)

## 2019-01-06 ENCOUNTER — Inpatient Hospital Stay (HOSPITAL_BASED_OUTPATIENT_CLINIC_OR_DEPARTMENT_OTHER): Payer: Medicare HMO | Admitting: Oncology

## 2019-01-06 ENCOUNTER — Inpatient Hospital Stay: Payer: Medicare HMO | Admitting: Oncology

## 2019-01-06 ENCOUNTER — Inpatient Hospital Stay: Payer: Medicare HMO

## 2019-01-06 ENCOUNTER — Other Ambulatory Visit: Payer: Self-pay

## 2019-01-06 ENCOUNTER — Encounter: Payer: Self-pay | Admitting: Oncology

## 2019-01-06 DIAGNOSIS — C9110 Chronic lymphocytic leukemia of B-cell type not having achieved remission: Secondary | ICD-10-CM

## 2019-01-06 NOTE — Progress Notes (Signed)
Patient stated that she is not able to sleep at night and she does not have a great day the following day.

## 2019-01-09 NOTE — Progress Notes (Signed)
I connected with Lori Shaw on 01/09/19 at  9:30 AM EST by video enabled telemedicine visit and verified that I am speaking with the correct person using two identifiers.   I discussed the limitations, risks, security and privacy concerns of performing an evaluation and management service by telemedicine and the availability of in-person appointments. I also discussed with the patient that there may be a patient responsible charge related to this service. The patient expressed understanding and agreed to proceed.  Other persons participating in the visit and their role in the encounter:  none  Patient's location:  home Provider's location:  Work  Diagnosis: Rai stage 0 CLL  Chief Complaint: Routine follow-up of CLL  History of present illness:  patient is a 73 year old female with no significant past medical history other than low B12 and low vitamin D levels. Patient was recently noted to have leukocytosis with a white count of 13. Differential mainly showed lymphocytosis. Recent CBC from 07/03/2016 showed white count of 13, H&H of 14.7/44 and a platelet count of 207. Differential showed 63.5% lymphocytes. Peripheral flow cytometry showed CD5 positive, CD23 positive clonal B-cell population CLL/SLL phenotype.   Interval history reports ongoing chronic fatigue.  Also states she is having difficulty sleeping at night reports having occasional episodes of excessive sweating but denies any drenching night sweats.  Appetite and weight are stable   Review of Systems  Constitutional: Positive for malaise/fatigue. Negative for chills, fever and weight loss.  HENT: Negative for congestion, ear discharge and nosebleeds.   Eyes: Negative for blurred vision.  Respiratory: Negative for cough, hemoptysis, sputum production, shortness of breath and wheezing.   Cardiovascular: Negative for chest pain, palpitations, orthopnea and claudication.  Gastrointestinal: Negative for abdominal pain, blood in  stool, constipation, diarrhea, heartburn, melena, nausea and vomiting.  Genitourinary: Negative for dysuria, flank pain, frequency, hematuria and urgency.  Musculoskeletal: Negative for back pain, joint pain and myalgias.  Skin: Negative for rash.  Neurological: Negative for dizziness, tingling, focal weakness, seizures, weakness and headaches.  Endo/Heme/Allergies: Does not bruise/bleed easily.  Psychiatric/Behavioral: Negative for depression and suicidal ideas. The patient does not have insomnia.     Allergies  Allergen Reactions  . Sulfa Antibiotics Nausea Only and Nausea And Vomiting    Past Medical History:  Diagnosis Date  . B12 deficiency   . Back pain   . CLL (chronic lymphocytic leukemia) ([place]) 06/21/2016   Rai stage 0, dx 06/2016 (Dr Janese Banks)  . HLD (hyperlipidemia)   . Joint pain   . Knee problem   . OSA on CPAP 2006, 04/2014   AHI of 23.8 and an RDI of 42.9, on CPAP 9 cm H2O (Dohmeier)  . Osteopenia 08/2010   T -1.4 at L femur neck  . Piriformis syndrome 12/14/2014  . Vitamin D deficiency     Past Surgical History:  Procedure Laterality Date  . ABDOMINAL HYSTERECTOMY  01/27/1983   for menometrorrhagia, one ovary remained  . CHOLECYSTECTOMY N/A 08/01/2018   LAPAROSCOPIC CHOLECYSTECTOMY; for gallstone pancreatitis  Lori Shaw, Lori Malta, MD)  . COLONOSCOPY  2012   no records available. per patient normal.   . dexa  08/2010   osteopenia  . TONSILLECTOMY  01/26/1973    Social History   Socioeconomic History  . Marital status: Married    Spouse name: Lori Shaw  . Number of children: 2  . Years of education: 1.5  . Highest education level: Not on file  Occupational History  . 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 Smoking status: Former Smoker    Packs/day: 2.00    Years: 20.00    Pack years: 40.00    Types: Cigarettes    Quit date: 01/26/1978    Years since quitting: 40.9  . Smokeless tobacco: Never Used  Substance and Sexual  Activity  . Alcohol use: No  . Drug use: Yes    Comment: occassionally glass of wine  . Sexual activity: Yes  Other Topics Concern  . Not on file  Social History Narrative   Lives with daughter, 1 dog   Occupation: retired, has worked in Chief Strategy Officer, also Forensic psychologist   Edu: 1.5 yrs college   Activity: square dancing   Diet: good water, fruits/vegetables daily   Patient drinks two cups of caffeine daily.   Remarried.     Social Determinants of Health   Financial Resource Strain:   . Difficulty of Paying Living Expenses: Not on file  Food Insecurity:   . Worried About Charity fundraiser in the Last Year: Not on file  . Ran Out of Food in the Last Year: Not on file  Transportation Needs:   . Lack of Transportation (Medical): Not on file  . Lack of Transportation (Non-Medical): Not on file  Physical Activity:   . Days of Exercise per Week: Not on file  . Minutes of Exercise per Session: Not on file  Stress:   . Feeling of Stress : Not on file  Social Connections:   . Frequency of Communication with Friends and Family: Not on file  . Frequency of Social Gatherings with Friends and Family: Not on file  . Attends Religious Services: Not on file  . Active Member of Clubs or Organizations: Not on file  . Attends Archivist Meetings: Not on file  . Marital Status: Not on file  Intimate Partner Violence:   . Fear of Current or Ex-Partner: Not on file  . Emotionally Abused: Not on file  . Physically Abused: Not on file  . Sexually Abused: Not on file    Family History  Problem Relation Age of Onset  . Hyperlipidemia Mother   . CAD Mother        CABG  . Hypertension Mother   . Hyperlipidemia Father   . CAD Father        MI  . Sudden death Father   . Arthritis Maternal Grandmother   . Cancer Sister        breast  . Breast cancer Sister 89  . Alzheimer's disease Other   . Heart attack Other   . Diabetes Neg Hx   . Stroke Neg Hx      Current  Outpatient Medications:  .  B Complex Vitamins (B COMPLEX 1 PO), Take 1 tablet by mouth daily., Disp: , Rfl:  .  cholecalciferol (VITAMIN D3) 25 MCG (1000 UT) tablet, Take 2,000 Units by mouth daily., Disp: , Rfl:  .  Cyanocobalamin (VITAMIN B-12) 2500 MCG SUBL, Place 1 tablet (2,500 mcg total) under the tongue daily., Disp: , Rfl: 0 .  fluticasone (FLONASE) 50 MCG/ACT nasal spray, Place 2 sprays into both nostrils daily as needed., Disp: 16 g, Rfl: 11 .  rosuvastatin (CRESTOR) 10 MG tablet, Take 1 tablet (10 mg total) by mouth daily. (Patient taking differently: Take 5 mg by mouth daily. ), Disp: 30 tablet, Rfl: 6 .  Vitamin D, Ergocalciferol, (DRISDOL) 1.25 MG (50000 UT) CAPS capsule, Take 1 capsule (50,000 Units total)  by mouth every 14 (fourteen) days., Disp: 4 capsule, Rfl: 0  No results found.  No images are attached to the encounter.   CMP Latest Ref Rng & Units 01/05/2019  Glucose 70 - 99 mg/dL 88  BUN 8 - 23 mg/dL 14  Creatinine 0.44 - 1.00 mg/dL 0.73  Sodium 135 - 145 mmol/L 141  Potassium 3.5 - 5.1 mmol/L 4.2  Chloride 98 - 111 mmol/L 108  CO2 22 - 32 mmol/L 25  Calcium 8.9 - 10.3 mg/dL 9.0  Total Protein 6.5 - 8.1 g/dL 6.8  Total Bilirubin 0.3 - 1.2 mg/dL 0.5  Alkaline Phos 38 - 126 U/L 60  AST 15 - 41 U/L 17  ALT 0 - 44 U/L 16   CBC Latest Ref Rng & Units 01/05/2019  WBC 4.0 - 10.5 K/uL 20.7(H)  Hemoglobin 12.0 - 15.0 g/dL 14.2  Hematocrit 36.0 - 46.0 % 43.7  Platelets 150 - 400 K/uL 205     Observation/objective: Appears in no acute distress of a video visit today.  Breathing is nonlabored  Assessment and plan: Patient is a 73 year old female with Rai stage 0 CLL currently under observation  Patient does have baseline leukocytosis/lymphocytosis.  However her lymphocytosis doubling time remains greater than 6 months.  Hemoglobin and platelet counts are normal.  Her weight has remained stable overall.  I do not feel that her symptoms of insomnia are related to CLL.   As such I do not feel that her CLL needs any treatment at this time.  Follow-up instructions: I will see her for in person assessment in 6 months time with labs  I discussed the assessment and treatment plan with the patient. The patient was provided an opportunity to ask questions and all were answered. The patient agreed with the plan and demonstrated an understanding of the instructions.   The patient was advised to call back or seek an in-person evaluation if the symptoms worsen or if the condition fails to improve as anticipated.  Visit Diagnosis: 1. CLL (chronic lymphocytic leukemia) ([place])     Dr. Randa Evens, MD, MPH [facility] [phone] 01/09/2019 8:50 AM

## 2019-01-25 DIAGNOSIS — G4733 Obstructive sleep apnea (adult) (pediatric): Secondary | ICD-10-CM | POA: Diagnosis not present

## 2019-01-29 ENCOUNTER — Other Ambulatory Visit (INDEPENDENT_AMBULATORY_CARE_PROVIDER_SITE_OTHER): Payer: Self-pay | Admitting: Family Medicine

## 2019-01-29 DIAGNOSIS — E559 Vitamin D deficiency, unspecified: Secondary | ICD-10-CM

## 2019-02-09 DIAGNOSIS — H353221 Exudative age-related macular degeneration, left eye, with active choroidal neovascularization: Secondary | ICD-10-CM | POA: Diagnosis not present

## 2019-02-09 DIAGNOSIS — D3131 Benign neoplasm of right choroid: Secondary | ICD-10-CM | POA: Diagnosis not present

## 2019-02-09 DIAGNOSIS — D3132 Benign neoplasm of left choroid: Secondary | ICD-10-CM | POA: Diagnosis not present

## 2019-02-09 DIAGNOSIS — H353112 Nonexudative age-related macular degeneration, right eye, intermediate dry stage: Secondary | ICD-10-CM | POA: Diagnosis not present

## 2019-02-09 DIAGNOSIS — H2512 Age-related nuclear cataract, left eye: Secondary | ICD-10-CM | POA: Diagnosis not present

## 2019-02-09 DIAGNOSIS — H25811 Combined forms of age-related cataract, right eye: Secondary | ICD-10-CM | POA: Diagnosis not present

## 2019-02-24 DIAGNOSIS — G4733 Obstructive sleep apnea (adult) (pediatric): Secondary | ICD-10-CM | POA: Diagnosis not present

## 2019-02-25 DIAGNOSIS — G4733 Obstructive sleep apnea (adult) (pediatric): Secondary | ICD-10-CM | POA: Diagnosis not present

## 2019-03-15 ENCOUNTER — Other Ambulatory Visit (INDEPENDENT_AMBULATORY_CARE_PROVIDER_SITE_OTHER): Payer: Self-pay | Admitting: Family Medicine

## 2019-03-15 DIAGNOSIS — E559 Vitamin D deficiency, unspecified: Secondary | ICD-10-CM

## 2019-03-26 DIAGNOSIS — G4733 Obstructive sleep apnea (adult) (pediatric): Secondary | ICD-10-CM | POA: Diagnosis not present

## 2019-03-27 ENCOUNTER — Encounter: Payer: Self-pay | Admitting: Family Medicine

## 2019-03-27 DIAGNOSIS — E785 Hyperlipidemia, unspecified: Secondary | ICD-10-CM

## 2019-03-27 DIAGNOSIS — E538 Deficiency of other specified B group vitamins: Secondary | ICD-10-CM

## 2019-03-27 DIAGNOSIS — E559 Vitamin D deficiency, unspecified: Secondary | ICD-10-CM

## 2019-03-27 NOTE — Telephone Encounter (Signed)
Per Pt Msg, 10/18/18, Dr. Darnell Level wanted pt to RTC for 3 mo chol f/u with labs.  Spoke with pt relaying this info.  Scheduled OV on 03/31/19 at 9:00 and fasting labs on 03/29/19 at 9:05.  Plz order labs.

## 2019-03-28 NOTE — Telephone Encounter (Signed)
Labs ordered.

## 2019-03-28 NOTE — Addendum Note (Signed)
Addended by: Ria Bush on: 03/28/2019 06:31 PM   Modules accepted: Orders

## 2019-03-29 ENCOUNTER — Other Ambulatory Visit: Payer: Self-pay

## 2019-03-29 ENCOUNTER — Other Ambulatory Visit (INDEPENDENT_AMBULATORY_CARE_PROVIDER_SITE_OTHER): Payer: Medicare HMO

## 2019-03-29 DIAGNOSIS — E785 Hyperlipidemia, unspecified: Secondary | ICD-10-CM | POA: Diagnosis not present

## 2019-03-29 LAB — COMPREHENSIVE METABOLIC PANEL
ALT: 14 U/L (ref 0–35)
AST: 15 U/L (ref 0–37)
Albumin: 4.4 g/dL (ref 3.5–5.2)
Alkaline Phosphatase: 63 U/L (ref 39–117)
BUN: 12 mg/dL (ref 6–23)
CO2: 29 mEq/L (ref 19–32)
Calcium: 9.6 mg/dL (ref 8.4–10.5)
Chloride: 104 mEq/L (ref 96–112)
Creatinine, Ser: 0.77 mg/dL (ref 0.40–1.20)
GFR: 73.56 mL/min (ref >60.00)
Glucose, Bld: 96 mg/dL (ref 70–99)
Potassium: 4.2 mEq/L (ref 3.5–5.1)
Sodium: 141 mEq/L (ref 135–145)
Total Bilirubin: 0.6 mg/dL (ref 0.2–1.2)
Total Protein: 6.6 g/dL (ref 6.0–8.3)

## 2019-03-29 LAB — LIPID PANEL
Cholesterol: 177 mg/dL (ref 0–200)
HDL: 51.4 mg/dL (ref >39.00)
NonHDL: 125.23
Total CHOL/HDL Ratio: 3
Triglycerides: 216 mg/dL — ABNORMAL HIGH (ref 0.0–149.0)
VLDL: 43.2 mg/dL — ABNORMAL HIGH (ref 0.0–40.0)

## 2019-03-29 LAB — LDL CHOLESTEROL, DIRECT: Direct LDL: 94 mg/dL

## 2019-03-29 LAB — TSH: TSH: 2.84 u[IU]/mL (ref 0.35–4.50)

## 2019-03-30 ENCOUNTER — Other Ambulatory Visit (INDEPENDENT_AMBULATORY_CARE_PROVIDER_SITE_OTHER): Payer: Medicare HMO

## 2019-03-30 DIAGNOSIS — E559 Vitamin D deficiency, unspecified: Secondary | ICD-10-CM | POA: Diagnosis not present

## 2019-03-30 LAB — VITAMIN D 25 HYDROXY (VIT D DEFICIENCY, FRACTURES): VITD: 38.65 ng/mL (ref 30.00–100.00)

## 2019-03-30 NOTE — Addendum Note (Signed)
Addended by: Ria Bush on: 03/30/2019 02:25 PM   Modules accepted: Orders

## 2019-03-31 ENCOUNTER — Encounter: Payer: Self-pay | Admitting: Family Medicine

## 2019-03-31 ENCOUNTER — Ambulatory Visit (INDEPENDENT_AMBULATORY_CARE_PROVIDER_SITE_OTHER): Payer: Medicare HMO | Admitting: Family Medicine

## 2019-03-31 ENCOUNTER — Other Ambulatory Visit: Payer: Self-pay

## 2019-03-31 VITALS — BP 124/76 | HR 61 | Temp 97.8°F | Ht 65.0 in | Wt 199.3 lb

## 2019-03-31 DIAGNOSIS — E669 Obesity, unspecified: Secondary | ICD-10-CM

## 2019-03-31 DIAGNOSIS — E559 Vitamin D deficiency, unspecified: Secondary | ICD-10-CM

## 2019-03-31 DIAGNOSIS — C9110 Chronic lymphocytic leukemia of B-cell type not having achieved remission: Secondary | ICD-10-CM | POA: Diagnosis not present

## 2019-03-31 DIAGNOSIS — E785 Hyperlipidemia, unspecified: Secondary | ICD-10-CM | POA: Diagnosis not present

## 2019-03-31 NOTE — Assessment & Plan Note (Addendum)
Stable period with routine monitoring by onc.  Appreciate Onc care.

## 2019-03-31 NOTE — Assessment & Plan Note (Signed)
Chronic, improved on crestor - continue. The 10-year ASCVD risk score Mikey Bussing DC Brooke Bonito., et al., 2013) is: 10.9%   Values used to calculate the score:     Age: 74 years     Sex: Female     Is Non-Hispanic African American: No     Diabetic: No     Tobacco smoker: No     Systolic Blood Pressure: [id number] mmHg     Is BP treated: No     HDL Cholesterol: 51.4 mg/dL     Total Cholesterol: 177 mg/dL

## 2019-03-31 NOTE — Progress Notes (Signed)
This visit was conducted in person.  BP 124/76 (BP Location: Right Arm, Patient Position: Sitting, Cuff Size: Large)   Pulse 61   Temp 97.8 F (36.6 C) (Temporal)   Ht 5\' 5"  (1.651 m)   Wt 199 lb 5 oz (90.4 kg)   SpO2 99%   BMI 33.17 kg/m    CC: 6 mo f/u visit Subjective:    Patient ID: Lori Shaw, female    DOB: May 18, 1945, 74 y.o.   MRN: [id number]  HPI: Lori Shaw is a 74 y.o. female presenting on 03/31/2019 for Hyperlipidemia (Here for 3 mo f/u.)   CLL - stable period stage 0, followed by onc.  Stopping [facility] 01/2019. Weight gain noted.   HLD - rosuvastatin 10mg  daily started 09/2018. Tolerating med well. Initially with some malaise.   Vit D - now off vit D 50k weekly, on 2000 IU daily.  Vit B12 - continues 254mcg daily.      Relevant past medical, surgical, family and social history reviewed and updated as indicated. Interim medical history since our last visit reviewed. Allergies and medications reviewed and updated. Outpatient Medications Prior to Visit  Medication Sig Dispense Refill  . B Complex Vitamins (B COMPLEX 1 PO) Take 1 tablet by mouth daily.    . Cholecalciferol (VITAMIN D3) 50 MCG (2000 UT) TABS Take 1 tablet by mouth daily.    . Cyanocobalamin (VITAMIN B-12) 2500 MCG SUBL Place 1 tablet (2,500 mcg total) under the tongue daily.  0  . fluticasone (FLONASE) 50 MCG/ACT nasal spray Place 2 sprays into both nostrils daily as needed. 16 g 11  . rosuvastatin (CRESTOR) 10 MG tablet Take 1 tablet (10 mg total) by mouth daily. (Patient taking differently: Take 5 mg by mouth daily. ) 30 tablet 6  . cholecalciferol (VITAMIN D3) 25 MCG (1000 UT) tablet Take 2,000 Units by mouth daily.    . Vitamin D, Ergocalciferol, (DRISDOL) 1.25 MG (50000 UT) CAPS capsule Take 1 capsule (50,000 Units total) by mouth every 14 (fourteen) days. 4 capsule 0   No facility-administered medications prior to visit.     Per HPI  unless specifically indicated in ROS section below Review of Systems Objective:    BP 124/76 (BP Location: Right Arm, Patient Position: Sitting, Cuff Size: Large)   Pulse 61   Temp 97.8 F (36.6 C) (Temporal)   Ht 5\' 5"  (1.651 m)   Wt 199 lb 5 oz (90.4 kg)   SpO2 99%   BMI 33.17 kg/m   Wt Readings from Last 3 Encounters:  03/31/19 199 lb 5 oz (90.4 kg)  12/06/18 185 lb (83.9 kg)  11/08/18 190 lb 12.8 oz (86.5 kg)    Physical Exam Vitals and nursing note reviewed.  Constitutional:      Appearance: Normal appearance. She is not ill-appearing.  Cardiovascular:     Rate and Rhythm: Normal rate and regular rhythm.     Pulses: Normal pulses.     Heart sounds: Normal heart sounds. No murmur.  Pulmonary:     Effort: Pulmonary effort is normal. No respiratory distress.     Breath sounds: Normal breath sounds. No wheezing, rhonchi or rales.  Musculoskeletal:     Right lower leg: No edema.     Left lower leg: No edema.  Neurological:     Mental Status: She is alert.  Psychiatric:        Mood and Affect: Mood normal.  Behavior: Behavior normal.       Results for orders placed or performed in visit on 03/30/19  Vitamin D 25 hydroxy  Result Value Ref Range   VITD 38.65 30.00 - 100.00 ng/mL   Assessment & Plan:  This visit occurred during the SARS-CoV-2 public health emergency.  Safety protocols were in place, including screening questions prior to the visit, additional usage of staff PPE, and extensive cleaning of exam room while observing appropriate contact time as indicated for disinfecting solutions.   Problem List Items Addressed This Visit    Vitamin D deficiency    Stable period on current regimen.       Obesity, Class I, BMI 30-34.9    Weight gain noted. Encouraged healthy diet and lifestyle changes for sustainable weight loss.       HLD (hyperlipidemia) - Primary    Chronic, improved on crestor - continue. The 10-year ASCVD risk score Mikey Bussing DC Brooke Bonito., et al.,  2013) is: 10.9%   Values used to calculate the score:     Age: 33 years     Sex: Female     Is Non-Hispanic African American: No     Diabetic: No     Tobacco smoker: No     Systolic Blood Pressure: A999333 mmHg     Is BP treated: No     HDL Cholesterol: 51.4 mg/dL     Total Cholesterol: 177 mg/dL       CLL (chronic lymphocytic leukemia) ([facility])    Stable period with routine monitoring by onc.  Appreciate Onc care.           No orders of the defined types were placed in this encounter.  No orders of the defined types were placed in this encounter.   Patient Instructions  You are doing well today! Continue current medicines and vitamin supplements. Return as needed or in 4 months for wellness visit, physical.    Follow up plan: Return in about 4 months (around 07/31/2019) for annual exam, prior fasting for blood work, medicare wellness visit.  Ria Bush, MD

## 2019-03-31 NOTE — Assessment & Plan Note (Signed)
Stable period on current regimen.  

## 2019-03-31 NOTE — Assessment & Plan Note (Signed)
Weight gain noted. Encouraged healthy diet and lifestyle changes for sustainable weight loss.

## 2019-03-31 NOTE — Patient Instructions (Addendum)
You are doing well today! Continue current medicines and vitamin supplements. Return as needed or in 4 months for wellness visit, physical.

## 2019-04-06 DIAGNOSIS — H35361 Drusen (degenerative) of macula, right eye: Secondary | ICD-10-CM | POA: Diagnosis not present

## 2019-04-06 DIAGNOSIS — D3132 Benign neoplasm of left choroid: Secondary | ICD-10-CM | POA: Diagnosis not present

## 2019-04-06 DIAGNOSIS — H353221 Exudative age-related macular degeneration, left eye, with active choroidal neovascularization: Secondary | ICD-10-CM | POA: Diagnosis not present

## 2019-04-06 DIAGNOSIS — D3131 Benign neoplasm of right choroid: Secondary | ICD-10-CM | POA: Diagnosis not present

## 2019-04-06 DIAGNOSIS — H43821 Vitreomacular adhesion, right eye: Secondary | ICD-10-CM | POA: Diagnosis not present

## 2019-04-06 DIAGNOSIS — H353112 Nonexudative age-related macular degeneration, right eye, intermediate dry stage: Secondary | ICD-10-CM | POA: Diagnosis not present

## 2019-05-11 ENCOUNTER — Other Ambulatory Visit: Payer: Self-pay | Admitting: Family Medicine

## 2019-05-22 DIAGNOSIS — G4733 Obstructive sleep apnea (adult) (pediatric): Secondary | ICD-10-CM | POA: Diagnosis not present

## 2019-05-31 ENCOUNTER — Encounter: Payer: Self-pay | Admitting: Family Medicine

## 2019-05-31 NOTE — Telephone Encounter (Signed)
Lvm asking pt to call back.  Need to get details of sxs.  May need virtual visit.

## 2019-06-01 ENCOUNTER — Encounter: Payer: Self-pay | Admitting: Family Medicine

## 2019-06-01 DIAGNOSIS — J302 Other seasonal allergic rhinitis: Secondary | ICD-10-CM

## 2019-06-01 MED ORDER — FLUTICASONE PROPIONATE 50 MCG/ACT NA SUSP: 2.0000 | Freq: Every day | NASAL | 3 refills | Status: DC | PRN

## 2019-06-01 NOTE — Telephone Encounter (Signed)
Pt scheduled on 06/05/19 at 9:45.

## 2019-06-05 ENCOUNTER — Ambulatory Visit: Payer: Medicare HMO | Admitting: Family Medicine

## 2019-06-08 DIAGNOSIS — D3132 Benign neoplasm of left choroid: Secondary | ICD-10-CM | POA: Diagnosis not present

## 2019-06-08 DIAGNOSIS — H353112 Nonexudative age-related macular degeneration, right eye, intermediate dry stage: Secondary | ICD-10-CM | POA: Diagnosis not present

## 2019-06-08 DIAGNOSIS — H353221 Exudative age-related macular degeneration, left eye, with active choroidal neovascularization: Secondary | ICD-10-CM | POA: Diagnosis not present

## 2019-06-08 DIAGNOSIS — D3131 Benign neoplasm of right choroid: Secondary | ICD-10-CM | POA: Diagnosis not present

## 2019-06-08 DIAGNOSIS — H25813 Combined forms of age-related cataract, bilateral: Secondary | ICD-10-CM | POA: Diagnosis not present

## 2019-06-21 DIAGNOSIS — G4733 Obstructive sleep apnea (adult) (pediatric): Secondary | ICD-10-CM | POA: Diagnosis not present

## 2019-07-05 ENCOUNTER — Other Ambulatory Visit: Payer: Self-pay

## 2019-07-05 DIAGNOSIS — C9110 Chronic lymphocytic leukemia of B-cell type not having achieved remission: Secondary | ICD-10-CM

## 2019-07-07 ENCOUNTER — Other Ambulatory Visit: Payer: Self-pay

## 2019-07-07 ENCOUNTER — Inpatient Hospital Stay: Payer: Medicare HMO | Attending: Oncology

## 2019-07-07 ENCOUNTER — Inpatient Hospital Stay: Payer: Medicare HMO | Admitting: Oncology

## 2019-07-07 ENCOUNTER — Encounter: Payer: Self-pay | Admitting: Oncology

## 2019-07-07 VITALS — BP 141/73 | HR 62 | Temp 96.5°F | Resp 16 | Wt 203.8 lb

## 2019-07-07 DIAGNOSIS — M8580 Other specified disorders of bone density and structure, unspecified site: Secondary | ICD-10-CM | POA: Diagnosis not present

## 2019-07-07 DIAGNOSIS — Z87891 Personal history of nicotine dependence: Secondary | ICD-10-CM | POA: Diagnosis not present

## 2019-07-07 DIAGNOSIS — E785 Hyperlipidemia, unspecified: Secondary | ICD-10-CM | POA: Diagnosis not present

## 2019-07-07 DIAGNOSIS — C9110 Chronic lymphocytic leukemia of B-cell type not having achieved remission: Secondary | ICD-10-CM

## 2019-07-07 LAB — CBC WITH DIFFERENTIAL/PLATELET
Abs Immature Granulocytes: 0 10*3/uL (ref 0.00–0.07)
Basophils Absolute: 0 10*3/uL (ref 0.0–0.1)
Basophils Relative: 0 %
Eosinophils Absolute: 0.4 10*3/uL (ref 0.0–0.5)
Eosinophils Relative: 2 %
HCT: 45.5 % (ref 36.0–46.0)
Hemoglobin: 15.2 g/dL — ABNORMAL HIGH (ref 12.0–15.0)
Lymphocytes Relative: 70 %
Lymphs Abs: 14.5 10*3/uL — ABNORMAL HIGH (ref 0.7–4.0)
MCH: 31 pg (ref 26.0–34.0)
MCHC: 33.4 g/dL (ref 30.0–36.0)
MCV: 92.9 fL (ref 80.0–100.0)
Monocytes Absolute: 0.4 10*3/uL (ref 0.1–1.0)
Monocytes Relative: 2 %
Neutro Abs: 5.4 10*3/uL (ref 1.7–7.7)
Neutrophils Relative %: 26 %
Platelets: 180 10*3/uL (ref 150–400)
RBC: 4.9 MIL/uL (ref 3.87–5.11)
RDW: 13.5 % (ref 11.5–15.5)
Smear Review: NORMAL
WBC: 20.7 10*3/uL — ABNORMAL HIGH (ref 4.0–10.5)
nRBC: 0 % (ref 0.0–0.2)

## 2019-07-07 LAB — COMPREHENSIVE METABOLIC PANEL
ALT: 19 U/L (ref 0–44)
AST: 19 U/L (ref 15–41)
Albumin: 4.3 g/dL (ref 3.5–5.0)
Alkaline Phosphatase: 65 U/L (ref 38–126)
Anion gap: 9 (ref 5–15)
BUN: 14 mg/dL (ref 8–23)
CO2: 26 mmol/L (ref 22–32)
Calcium: 8.8 mg/dL — ABNORMAL LOW (ref 8.9–10.3)
Chloride: 106 mmol/L (ref 98–111)
Creatinine, Ser: 0.81 mg/dL (ref 0.44–1.00)
GFR calc Af Amer: >60 mL/min (ref >60)
GFR calc non Af Amer: >60 mL/min (ref >60)
Glucose, Bld: 98 mg/dL (ref 70–99)
Potassium: 4.2 mmol/L (ref 3.5–5.1)
Sodium: 141 mmol/L (ref 135–145)
Total Bilirubin: 0.8 mg/dL (ref 0.3–1.2)
Total Protein: 6.9 g/dL (ref 6.5–8.1)

## 2019-07-07 NOTE — Progress Notes (Signed)
Patient denies any concerns or pain at appointment today.

## 2019-07-09 NOTE — Progress Notes (Signed)
Hematology/Oncology Consult note [facility]  [phone] [phone]  Patient Care Team: Ria Bush, MD as PCP - General (Family Medicine) Solon Augusta, MD as Attending Physician (Ophthalmology) Elbert Ewings, DDS as Consulting Physician (Dentistry)   Name of the patient: [place]  [id number]  1945/08/19   Date of visit: 07/09/19  Diagnosis-Rai stage 0 CLL  Chief complaint/ Reason for visit-routine follow-up of CLL  Heme/Onc history: patient is a 74 year old female with no significant past medical history other than low B12 and low vitamin D levels. Patient was recently noted to have leukocytosis with a white count of 13. Differential mainly showed lymphocytosis. Recent CBC from 07/03/2016 showed white count of 13, H&H of 14.7/44 and a platelet count of 207. Differential showed 63.5% lymphocytes. Peripheral flow cytometry showed CD5 positive, CD23 positive clonal B-cell population CLL/SLL phenotype.   Interval history-patient reports ongoing fatigue which has been a chronic issue for her.  Denies any changes in her appetite or unintentional weight loss.  Denies any significant night sweats.  She has gained 15 pounds in the last 6 months.  ECOG PS- 1 Pain scale- 0   Review of systems- Review of Systems  Constitutional: Positive for malaise/fatigue. Negative for chills, fever and weight loss.  HENT: Negative for congestion, ear discharge and nosebleeds.   Eyes: Negative for blurred vision.  Respiratory: Negative for cough, hemoptysis, sputum production, shortness of breath and wheezing.   Cardiovascular: Negative for chest pain, palpitations, orthopnea and claudication.  Gastrointestinal: Negative for abdominal pain, blood in stool, constipation, diarrhea, heartburn, melena, nausea and vomiting.  Genitourinary: Negative for dysuria, flank pain, frequency, hematuria and urgency.  Musculoskeletal: Negative for back  pain, joint pain and myalgias.  Skin: Negative for rash.  Neurological: Negative for dizziness, tingling, focal weakness, seizures, weakness and headaches.  Endo/Heme/Allergies: Does not bruise/bleed easily.  Psychiatric/Behavioral: Negative for depression and suicidal ideas. The patient does not have insomnia.        Allergies  Allergen Reactions  . Sulfa Antibiotics Nausea Only and Nausea And Vomiting     Past Medical History:  Diagnosis Date  . B12 deficiency   . Back pain   . CLL (chronic lymphocytic leukemia) ([place]) 06/21/2016   Rai stage 0, dx 06/2016 (Dr Janese Banks)  . HLD (hyperlipidemia)   . Joint pain   . Knee problem   . OSA on CPAP 2006, 04/2014   AHI of 23.8 and an RDI of 42.9, on CPAP 9 cm H2O (Dohmeier)  . Osteopenia 08/2010   T -1.4 at L femur neck  . Piriformis syndrome 12/14/2014  . Vitamin D deficiency      Past Surgical History:  Procedure Laterality Date  . ABDOMINAL HYSTERECTOMY  01/27/1983   for menometrorrhagia, one ovary remained  . CHOLECYSTECTOMY N/A 08/01/2018   LAPAROSCOPIC CHOLECYSTECTOMY; for gallstone pancreatitis  Celine Ahr, Anderson Malta, MD)  . COLONOSCOPY  2012   no records available. per patient normal.   . dexa  08/2010   osteopenia  . TONSILLECTOMY  01/26/1973    Social History   Socioeconomic History  . Marital status: Married    Spouse name: Nicolemarie Wooley  . Number of children: 2  . Years of education: 1.5  . Highest education level: Not on file  Occupational History  . 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 Smoking status: Former Smoker    Packs/day: 2.00    Years: 20.00    Pack years: 40.00  Types: Cigarettes    Quit date: 01/26/1978    Years since quitting: 41.4  . Smokeless tobacco: Never Used  Vaping Use  . Vaping Use: Never used  Substance and Sexual Activity  . Alcohol use: No  . Drug use: Yes    Comment: occassionally glass of wine  . Sexual activity: Yes  Other Topics Concern  . Not  on file  Social History Narrative   Lives with daughter, 1 dog   Occupation: retired, has worked in Chief Strategy Officer, also Forensic psychologist   Edu: 1.5 yrs college   Activity: square dancing   Diet: good water, fruits/vegetables daily   Patient drinks two cups of caffeine daily.   Remarried.     Social Determinants of Health   Financial Resource Strain:   . Difficulty of Paying Living Expenses:   Food Insecurity:   . Worried About Charity fundraiser in the Last Year:   . Arboriculturist in the Last Year:   Transportation Needs:   . Film/video editor (Medical):   Marland Kitchen Lack of Transportation (Non-Medical):   Physical Activity:   . Days of Exercise per Week:   . Minutes of Exercise per Session:   Stress:   . Feeling of Stress :   Social Connections:   . Frequency of Communication with Friends and Family:   . Frequency of Social Gatherings with Friends and Family:   . Attends Religious Services:   . Active Member of Clubs or Organizations:   . Attends Archivist Meetings:   Marland Kitchen Marital Status:   Intimate Partner Violence:   . Fear of Current or Ex-Partner:   . Emotionally Abused:   Marland Kitchen Physically Abused:   . Sexually Abused:     Family History  Problem Relation Age of Onset  . Hyperlipidemia Mother   . CAD Mother        CABG  . Hypertension Mother   . Hyperlipidemia Father   . CAD Father        MI  . Sudden death Father   . Arthritis Maternal Grandmother   . Cancer Sister        breast  . Breast cancer Sister 1  . Alzheimer's disease Other   . Heart attack Other   . Diabetes Neg Hx   . Stroke Neg Hx      Current Outpatient Medications:  .  B Complex Vitamins (B COMPLEX 1 PO), Take 1 tablet by mouth daily., Disp: , Rfl:  .  Cholecalciferol (VITAMIN D3) 50 MCG (2000 UT) TABS, Take 1 tablet by mouth daily., Disp: , Rfl:  .  Cyanocobalamin (VITAMIN B-12) 2500 MCG SUBL, Place 1 tablet (2,500 mcg total) under the tongue daily., Disp: , Rfl: 0 .   fluticasone (FLONASE) 50 MCG/ACT nasal spray, Place 2 sprays into both nostrils daily as needed., Disp: 16 g, Rfl: 3 .  rosuvastatin (CRESTOR) 10 MG tablet, TAKE ONE TABLET BY MOUTH DAILY, Disp: 30 tablet, Rfl: 3  Physical exam:  Vitals:   07/07/19 1054  BP: (!) 141/73  Pulse: 62  Resp: 16  Temp: (!) 96.5 F (35.8 C)  TempSrc: Tympanic  SpO2: 100%  Weight: 203 lb 12.8 oz (92.4 kg)   Physical Exam Constitutional:      General: She is not in acute distress. Cardiovascular:     Rate and Rhythm: Normal rate and regular rhythm.     Heart sounds: Normal heart sounds.  Pulmonary:  Effort: Pulmonary effort is normal.     Breath sounds: Normal breath sounds.  Abdominal:     General: Bowel sounds are normal.     Palpations: Abdomen is soft.     Comments: No palpable hepatosplenomegaly  Lymphadenopathy:     Comments: No palpable cervical, supraclavicular, axillary or inguinal adenopathy   Skin:    General: Skin is warm and dry.  Neurological:     Mental Status: She is alert and oriented to person, place, and time.      CMP Latest Ref Rng & Units 07/07/2019  Glucose 70 - 99 mg/dL 98  BUN 8 - 23 mg/dL 14  Creatinine 0.44 - 1.00 mg/dL 0.81  Sodium 135 - 145 mmol/L 141  Potassium 3.5 - 5.1 mmol/L 4.2  Chloride 98 - 111 mmol/L 106  CO2 22 - 32 mmol/L 26  Calcium 8.9 - 10.3 mg/dL 8.8(L)  Total Protein 6.5 - 8.1 g/dL 6.9  Total Bilirubin 0.3 - 1.2 mg/dL 0.8  Alkaline Phos 38 - 126 U/L 65  AST 15 - 41 U/L 19  ALT 0 - 44 U/L 19   CBC Latest Ref Rng & Units 07/07/2019  WBC 4.0 - 10.5 K/uL 20.7(H)  Hemoglobin 12.0 - 15.0 g/dL 15.2(H)  Hematocrit 36 - 46 % 45.5  Platelets 150 - 400 K/uL 180    Assessment and plan- Patient is a 74 y.o. female with Rai stage 0 CLL for routine follow-up  Patient has baseline leukocytosis with a white count that is currently between 18-24 last 1 year.  Absolute lymphocyte count remained stable between 9-10.  No evidence of anemia or  thrombocytopenia.  No palpable adenopathy or splenomegaly.  Patient does endorse fatigue which has been chronic for her.  But I did not see that as a sole reason to treat her CLL at this time.  Plan is for continued surveillance and I will see her back in 6 months   Visit Diagnosis 1. CLL (chronic lymphocytic leukemia) ([place])      Dr. Randa Evens, MD, MPH [facility] at [facility] [phone] 07/09/2019 8:16 AM

## 2019-07-22 DIAGNOSIS — G4733 Obstructive sleep apnea (adult) (pediatric): Secondary | ICD-10-CM | POA: Diagnosis not present

## 2019-08-04 DIAGNOSIS — L820 Inflamed seborrheic keratosis: Secondary | ICD-10-CM | POA: Diagnosis not present

## 2019-08-04 DIAGNOSIS — L918 Other hypertrophic disorders of the skin: Secondary | ICD-10-CM | POA: Diagnosis not present

## 2019-08-04 DIAGNOSIS — X32XXXD Exposure to sunlight, subsequent encounter: Secondary | ICD-10-CM | POA: Diagnosis not present

## 2019-08-04 DIAGNOSIS — L570 Actinic keratosis: Secondary | ICD-10-CM | POA: Diagnosis not present

## 2019-08-14 ENCOUNTER — Telehealth: Payer: Self-pay | Admitting: Family Medicine

## 2019-08-14 NOTE — Telephone Encounter (Signed)
E-scribed refill.  Plz schedule wellness, cpe and lab visits.  

## 2019-08-19 ENCOUNTER — Ambulatory Visit (INDEPENDENT_AMBULATORY_CARE_PROVIDER_SITE_OTHER): Payer: Medicare HMO

## 2019-08-19 ENCOUNTER — Ambulatory Visit (HOSPITAL_COMMUNITY)
Admission: EM | Admit: 2019-08-19 | Discharge: 2019-08-19 | Disposition: A | Discharge status: Home / Self Care | Payer: Medicare HMO | Attending: Family Medicine | Admitting: Family Medicine

## 2019-08-19 ENCOUNTER — Other Ambulatory Visit: Payer: Self-pay

## 2019-08-19 DIAGNOSIS — R2242 Localized swelling, mass and lump, left lower limb: Secondary | ICD-10-CM | POA: Diagnosis not present

## 2019-08-19 DIAGNOSIS — S8012XA Contusion of left lower leg, initial encounter: Secondary | ICD-10-CM | POA: Diagnosis not present

## 2019-08-19 DIAGNOSIS — M79662 Pain in left lower leg: Secondary | ICD-10-CM

## 2019-08-19 DIAGNOSIS — M7989 Other specified soft tissue disorders: Secondary | ICD-10-CM | POA: Diagnosis not present

## 2019-08-19 MED ORDER — NAPROXEN 500 MG PO TABS: 500.0000 mg | ORAL_TABLET | Freq: Two times a day (BID) | ORAL | 0 refills | Status: DC

## 2019-08-19 NOTE — Discharge Instructions (Signed)
NO fracture Use anti-inflammatories for pain/swelling. You may take up to 800 mg Ibuprofen every 8 hours with food. You may supplement Ibuprofen with Tylenol [phone] mg every 8 hours.  OR  Naprosyn twice daily Ice and elevate May alternate ice and heat  Follow up if developing worsening pain swelling within calf/lower leg

## 2019-08-19 NOTE — ED Triage Notes (Signed)
Pt presents to UC for fall from standing down stairs this morning. Pt now has bruising and swelling to left calf. Pt endorsing pain at site. Pt states pain is not worse with ambulation. Pt states pain is worse with pressure. Pt has been treating with ice, with minimal relief. No meds PTA. Pt denies hitting head or LOC with fall.

## 2019-08-20 NOTE — ED Provider Notes (Signed)
[place]    CSN: [id number] Arrival date & time: 08/19/19  1038      History   Chief Complaint Chief Complaint  Patient presents with  . Leg Pain    HPI [name] is a 74 y.o. female presenting today for evaluation of leg pain after fall.  Patient tripped down stairs of her deck and injured her left lower leg.  She has developed bruising and swelling over her shin.  She denies any ankle or knee pain.  Denies hip pain.  Denies hitting head or loss of consciousness.  She is not on blood thinners.  HPI  Past Medical History:  Diagnosis Date  . B12 deficiency   . Back pain   . CLL (chronic lymphocytic leukemia) ([place]) 06/21/2016   Rai stage 0, dx 06/2016 (Dr Janese Banks)  . HLD (hyperlipidemia)   . Joint pain   . Knee problem   . OSA on CPAP 2006, 04/2014   AHI of 23.8 and an RDI of 42.9, on CPAP 9 cm H2O (Dohmeier)  . Osteopenia 08/2010   T -1.4 at L femur neck  . Piriformis syndrome 12/14/2014  . Vitamin D deficiency     Patient Active Problem List   Diagnosis Date Noted  . Choroidal nevus of both eyes 11/01/2018  . Combined forms of age-related cataract of both eyes 11/01/2018  . Exudative age-related macular degeneration of left eye with active choroidal neovascularization ([place]) 11/01/2018  . S/P laparoscopic cholecystectomy 08/17/2018  . NAFLD (nonalcoholic fatty liver disease) 07/29/2018  . Acute pancreatitis 07/28/2018  . Insulin resistance 03/21/2018  . Vitamin D deficiency 06/30/2016  . CLL (chronic lymphocytic leukemia) ([place]) 06/21/2016  . Vitamin B12 deficiency 06/21/2016  . Other fatigue 06/18/2016  . ETD (eustachian tube dysfunction) 04/27/2016  . Rash and nonspecific skin eruption 08/23/2015  . Health maintenance examination 06/20/2015  . Advanced care planning/counseling discussion 06/20/2015  . Piriformis syndrome of left side 12/14/2014  . Hypersomnia, persistent 09/18/2014  . Obesity, Class I, BMI 30-34.9 09/18/2014  . Sleep  related headaches 04/24/2014  . Medicare annual wellness visit, subsequent 05/04/2013  . Seasonal allergic rhinitis 01/17/2013  . HLD (hyperlipidemia)   . OSA on CPAP   . Osteopenia 08/27/2010    Past Surgical History:  Procedure Laterality Date  . ABDOMINAL HYSTERECTOMY  01/27/1983   for menometrorrhagia, one ovary remained  . CHOLECYSTECTOMY N/A 08/01/2018   LAPAROSCOPIC CHOLECYSTECTOMY; for gallstone pancreatitis  Celine Ahr, Anderson Malta, MD)  . COLONOSCOPY  2012   no records available. per patient normal.   . dexa  08/2010   osteopenia  . TONSILLECTOMY  01/26/1973    OB History    Gravida  2   Para  2   Term      Preterm      AB      Living        SAB      TAB      Ectopic      Multiple      Live Births               Home Medications    Prior to Admission medications   Medication Sig Start Date End Date Taking? Authorizing Provider  B Complex Vitamins (B COMPLEX 1 PO) Take 1 tablet by mouth daily.    [provider]  Cholecalciferol (VITAMIN D3) 50 MCG (2000 UT) TABS Take 1 tablet by mouth daily.    [provider]  Cyanocobalamin (VITAMIN B-12) 2500  MCG SUBL Place 1 tablet (2,500 mcg total) under the tongue daily. 02/16/17   Ria Bush, MD  fluticasone [facility]) 50 MCG/ACT nasal spray Place 2 sprays into both nostrils daily as needed. 06/01/19   Ria Bush, MD  naproxen (NAPROSYN) 500 MG tablet Take 1 tablet (500 mg total) by mouth 2 (two) times daily. 08/19/19   Roza Creamer C, PA-C  rosuvastatin (CRESTOR) 10 MG tablet TAKE ONE TABLET BY MOUTH DAILY 08/14/19   Ria Bush, MD    Family History Family History  Problem Relation Age of Onset  . Hyperlipidemia Mother   . CAD Mother        CABG  . Hypertension Mother   . Hyperlipidemia Father   . CAD Father        MI  . Sudden death Father   . Arthritis Maternal Grandmother   . Cancer Sister        breast  . Breast cancer Sister 56  . Alzheimer's disease Other     . Heart attack Other   . Diabetes Neg Hx   . Stroke Neg Hx     Social History Social History   Tobacco Use  . Smoking status: Former Smoker    Packs/day: 2.00    Years: 20.00    Pack years: 40.00    Types: Cigarettes    Quit date: 01/26/1978    Years since quitting: 41.5  . Smokeless tobacco: Never Used  Vaping Use  . Vaping Use: Never used  Substance Use Topics  . Alcohol use: No  . Drug use: Yes    Comment: occassionally glass of wine     Allergies   Sulfa antibiotics   Review of Systems Review of Systems  Constitutional: Negative for fatigue and fever.  Eyes: Negative for visual disturbance.  Respiratory: Negative for shortness of breath.   Cardiovascular: Negative for chest pain.  Gastrointestinal: Negative for abdominal pain, nausea and vomiting.  Musculoskeletal: Positive for gait problem and joint swelling. Negative for arthralgias.  Skin: Positive for color change. Negative for rash and wound.  Neurological: Negative for dizziness, weakness, light-headedness and headaches.     Physical Exam Triage Vital Signs ED Triage Vitals  Enc Vitals Group     BP 08/19/19 1121 (!) 114/60     Pulse Rate 08/19/19 1121 68     Resp 08/19/19 1121 16     Temp 08/19/19 1121 98.1 F (36.7 C)     Temp Source 08/19/19 1121 Oral     SpO2 08/19/19 1121 100 %     Weight --      Height --      Head Circumference --      Peak Flow --      Pain Score 08/19/19 1123 6     Pain Loc --      Pain Edu? --      Excl. in [place]? --    No data found.  Updated Vital Signs BP (!) 114/60 (BP Location: Left Arm)   Pulse 68   Temp 98.1 F (36.7 C) (Oral)   Resp 16   SpO2 100%   Visual Acuity Right Eye Distance:   Left Eye Distance:   Bilateral Distance:    Right Eye Near:   Left Eye Near:    Bilateral Near:     Physical Exam Vitals and nursing note reviewed.  Constitutional:      Appearance: She is well-developed.     Comments: No acute distress  HENT:  Head:  Normocephalic and atraumatic.     Nose: Nose normal.  Eyes:     Conjunctiva/sclera: Conjunctivae normal.  Cardiovascular:     Rate and Rhythm: Normal rate.  Pulmonary:     Effort: Pulmonary effort is normal. No respiratory distress.  Abdominal:     General: There is no distension.  Musculoskeletal:        General: Normal range of motion.     Cervical back: Neck supple.     Comments: Left lower leg: Bruising and swelling noted to medial aspect of left lower leg, tender to palpation around this area,  Left knee: No swelling or discoloration, nontender to palpation over medial lateral joint line of knee, nontender over patella full active range of motion of knee  Left ankle: No swelling or discoloration, nontender to palpation over medial lateral malleoli extending into dorsum of foot, dorsalis pedis 2+  Skin:    General: Skin is warm and dry.  Neurological:     Mental Status: She is alert and oriented to person, place, and time.      UC Treatments / Results  Labs (all labs ordered are listed, but only abnormal results are displayed) Labs Reviewed - No data to display  EKG   Radiology DG Tibia/Fibula Left  Result Date: 08/19/2019 CLINICAL DATA:  Recent fall, swelling, pain and bruising EXAM: LEFT TIBIA AND FIBULA - 2 VIEW COMPARISON:  None. FINDINGS: Normal alignment without acute osseous finding or fracture. No subluxation or dislocation. Minor mid anterior tibial soft tissue swelling. IMPRESSION: Soft tissue swelling/injury without acute osseous finding Electronically Signed   By: Jerilynn Mages.  Shick M.D.   On: 08/19/2019 11:53    Procedures Procedures (including critical care time)  Medications Ordered in UC Medications - No data to display  Initial Impression / Assessment and Plan / UC Course  I have reviewed the triage vital signs and the nursing notes.  Pertinent labs & imaging results that were available during my care of the patient were reviewed by me and considered in my  medical decision making (see chart for details).     X-ray negative for acute abnormality, suspect likely soft tissue swelling/contusion.  Recommending anti-inflammatories ice and elevation.  Discussed strict return precautions. Patient verbalized understanding and is agreeable with plan.  Final Clinical Impressions(s) / UC Diagnoses   Final diagnoses:  Contusion of left lower extremity, initial encounter     Discharge Instructions     NO fracture Use anti-inflammatories for pain/swelling. You may take up to 800 mg Ibuprofen every 8 hours with food. You may supplement Ibuprofen with Tylenol [phone] mg every 8 hours.  OR  Naprosyn twice daily Ice and elevate May alternate ice and heat  Follow up if developing worsening pain swelling within calf/lower leg   ED Prescriptions    Medication Sig Dispense Auth. Provider   naproxen (NAPROSYN) 500 MG tablet Take 1 tablet (500 mg total) by mouth 2 (two) times daily. 30 tablet [place] Cookson, Jerome C, PA-C     PDMP not reviewed this encounter.   Janith Lima, [name] 08/20/19 [phone]

## 2019-08-23 NOTE — Telephone Encounter (Signed)
Called and got patient scheduled for CPE, AWV and labs. 

## 2019-08-24 DIAGNOSIS — D3132 Benign neoplasm of left choroid: Secondary | ICD-10-CM | POA: Diagnosis not present

## 2019-08-24 DIAGNOSIS — H25813 Combined forms of age-related cataract, bilateral: Secondary | ICD-10-CM | POA: Diagnosis not present

## 2019-08-24 DIAGNOSIS — D3131 Benign neoplasm of right choroid: Secondary | ICD-10-CM | POA: Diagnosis not present

## 2019-08-24 DIAGNOSIS — H353221 Exudative age-related macular degeneration, left eye, with active choroidal neovascularization: Secondary | ICD-10-CM | POA: Diagnosis not present

## 2019-08-24 DIAGNOSIS — H353112 Nonexudative age-related macular degeneration, right eye, intermediate dry stage: Secondary | ICD-10-CM | POA: Diagnosis not present

## 2019-08-26 ENCOUNTER — Other Ambulatory Visit: Payer: Self-pay

## 2019-08-26 ENCOUNTER — Ambulatory Visit (HOSPITAL_COMMUNITY)
Admission: EM | Admit: 2019-08-26 | Discharge: 2019-08-26 | Disposition: A | Discharge status: Home / Self Care | Payer: Medicare HMO

## 2019-08-26 ENCOUNTER — Encounter (HOSPITAL_COMMUNITY): Payer: Self-pay

## 2019-08-26 DIAGNOSIS — M7989 Other specified soft tissue disorders: Secondary | ICD-10-CM

## 2019-08-26 DIAGNOSIS — S8012XD Contusion of left lower leg, subsequent encounter: Secondary | ICD-10-CM

## 2019-08-26 DIAGNOSIS — R58 Hemorrhage, not elsewhere classified: Secondary | ICD-10-CM

## 2019-08-26 NOTE — ED Triage Notes (Signed)
Pt presents with swelling, sore and skin discoloration in the left ankle and foot. States she was seen 6 days ago for bruising and swelling in the left calf after a fall.

## 2019-08-26 NOTE — ED Provider Notes (Signed)
[place]   MRN: [id number] DOB: 09-04-1945  Subjective:   Lori Shaw is a 74 y.o. female presenting for recheck on her left lower leg injury.  Patient has had swelling and bruising that started out at her mid medial left lower leg.  She had imaging done at her last office visit and was negative.  Has been using Tylenol and elevating her legs.  However she has become concerned that the swelling has traveled down to her ankle and bruising has also moved to her ankle and foot.  She has concerns about if this is a blood clot or if she is going to lose her foot.  She states that she has had significant improvement in her pain.  Has had some intermittent tingling of the left lower leg on the medial aspect.  Denies warmth, erythema, history of DVT.  No current facility-administered medications for this encounter.  Current Outpatient Medications:  .  B Complex Vitamins (B COMPLEX 1 PO), Take 1 tablet by mouth daily., Disp: , Rfl:  .  Cholecalciferol (VITAMIN D3) 50 MCG (2000 UT) TABS, Take 1 tablet by mouth daily., Disp: , Rfl:  .  Cyanocobalamin (VITAMIN B-12) 2500 MCG SUBL, Place 1 tablet (2,500 mcg total) under the tongue daily., Disp: , Rfl: 0 .  fluticasone (FLONASE) 50 MCG/ACT nasal spray, Place 2 sprays into both nostrils daily as needed., Disp: 16 g, Rfl: 3 .  naproxen (NAPROSYN) 500 MG tablet, Take 1 tablet (500 mg total) by mouth 2 (two) times daily., Disp: 30 tablet, Rfl: 0 .  rosuvastatin (CRESTOR) 10 MG tablet, TAKE ONE TABLET BY MOUTH DAILY, Disp: 90 tablet, Rfl: 0   Allergies  Allergen Reactions  . Sulfa Antibiotics Nausea Only and Nausea And Vomiting    Past Medical History:  Diagnosis Date  . B12 deficiency   . Back pain   . CLL (chronic lymphocytic leukemia) ([place]) 06/21/2016   Rai stage 0, dx 06/2016 (Dr Janese Banks)  . HLD (hyperlipidemia)   . Joint pain   . Knee problem   . OSA on CPAP 2006, 04/2014   AHI of 23.8 and an RDI of 42.9, on CPAP 9 cm H2O  (Dohmeier)  . Osteopenia 08/2010   T -1.4 at L femur neck  . Piriformis syndrome 12/14/2014  . Vitamin D deficiency      Past Surgical History:  Procedure Laterality Date  . ABDOMINAL HYSTERECTOMY  01/27/1983   for menometrorrhagia, one ovary remained  . CHOLECYSTECTOMY N/A 08/01/2018   LAPAROSCOPIC CHOLECYSTECTOMY; for gallstone pancreatitis  Celine Ahr, Anderson Malta, MD)  . COLONOSCOPY  2012   no records available. per patient normal.   . dexa  08/2010   osteopenia  . TONSILLECTOMY  01/26/1973    Family History  Problem Relation Age of Onset  . Hyperlipidemia Mother   . CAD Mother        CABG  . Hypertension Mother   . Hyperlipidemia Father   . CAD Father        MI  . Sudden death Father   . Arthritis Maternal Grandmother   . Cancer Sister        breast  . Breast cancer Sister 80  . Alzheimer's disease Other   . Heart attack Other   . Diabetes Neg Hx   . Stroke Neg Hx     Social History   Tobacco Use  . Smoking status: Former Smoker    Packs/day: 2.00    Years: 20.00    Pack  years: 40.00    Types: Cigarettes    Quit date: 01/26/1978    Years since quitting: 41.6  . Smokeless tobacco: Never Used  Vaping Use  . Vaping Use: Never used  Substance Use Topics  . Alcohol use: No  . Drug use: Yes    Comment: occassionally glass of wine    ROS   Objective:   Vitals: BP (!) 120/59 (BP Location: Left Arm)   Pulse 68   Temp 97.8 F (36.6 C) (Oral)   Resp 18   SpO2 98%   Physical Exam Constitutional:      General: She is not in acute distress.    Appearance: Normal appearance. She is well-developed. She is not ill-appearing, toxic-appearing or diaphoretic.  HENT:     Head: Normocephalic and atraumatic.     Nose: Nose normal.     Mouth/Throat:     Mouth: Mucous membranes are moist.     Pharynx: Oropharynx is clear.  Eyes:     General: No scleral icterus.    Extraocular Movements: Extraocular movements intact.     Pupils: Pupils are equal, round, and  reactive to light.  Cardiovascular:     Rate and Rhythm: Normal rate.  Pulmonary:     Effort: Pulmonary effort is normal.  Musculoskeletal:        General: Swelling (1+ from mid medial leg extending medially into proximal ankle and also over medial aspect of foot with associated ecchymosis) and tenderness (Mild tenderness on deep palpation of medial mid left lower leg, negative Homans' sign) present.     Left lower leg: No edema.  Skin:    General: Skin is warm and dry.  Neurological:     General: No focal deficit present.     Mental Status: She is alert and oriented to person, place, and time.  Psychiatric:        Mood and Affect: Mood normal.        Behavior: Behavior normal.        Thought Content: Thought content normal.        Judgment: Judgment normal.      Assessment and Plan :   PDMP not reviewed this encounter.  1. Left leg swelling   2. Contusion of left leg, subsequent encounter   3. Ecchymosis     Reassured patient, recommended conservative management using over-the-counter pain medication, compression socks.  Physical exam findings reassuring, vital signs stable for outpatient management. Counseled patient on potential for adverse effects with medications prescribed/recommended today, ER and return-to-clinic precautions discussed, patient verbalized understanding.    Jaynee Eagles, PA-C 08/26/19 1124

## 2019-08-26 NOTE — Discharge Instructions (Signed)
Try to use compression socks for ~2 weeks to help clear swelling and fluid in your left lower leg. Please just use Tylenol at a dose of 500mg -650mg  once every 6 hours as needed for your aches, pains, fevers. Do not use any nonsteroidal anti-inflammatories (NSAIDs) like ibuprofen, Motrin, naproxen, Aleve, etc. which are all available over-the-counter.

## 2019-09-06 DIAGNOSIS — R69 Illness, unspecified: Secondary | ICD-10-CM | POA: Diagnosis not present

## 2019-09-14 DIAGNOSIS — G4733 Obstructive sleep apnea (adult) (pediatric): Secondary | ICD-10-CM | POA: Diagnosis not present

## 2019-09-21 DIAGNOSIS — D3131 Benign neoplasm of right choroid: Secondary | ICD-10-CM | POA: Diagnosis not present

## 2019-09-21 DIAGNOSIS — H524 Presbyopia: Secondary | ICD-10-CM | POA: Diagnosis not present

## 2019-10-06 DIAGNOSIS — Z0100 Encounter for examination of eyes and vision without abnormal findings: Secondary | ICD-10-CM | POA: Diagnosis not present

## 2019-10-15 DIAGNOSIS — G4733 Obstructive sleep apnea (adult) (pediatric): Secondary | ICD-10-CM | POA: Diagnosis not present

## 2019-10-17 DIAGNOSIS — G4733 Obstructive sleep apnea (adult) (pediatric): Secondary | ICD-10-CM | POA: Diagnosis not present

## 2019-10-22 ENCOUNTER — Other Ambulatory Visit: Payer: Self-pay | Admitting: Family Medicine

## 2019-10-22 DIAGNOSIS — E538 Deficiency of other specified B group vitamins: Secondary | ICD-10-CM

## 2019-10-22 DIAGNOSIS — E785 Hyperlipidemia, unspecified: Secondary | ICD-10-CM

## 2019-10-22 DIAGNOSIS — C9110 Chronic lymphocytic leukemia of B-cell type not having achieved remission: Secondary | ICD-10-CM

## 2019-10-22 DIAGNOSIS — E559 Vitamin D deficiency, unspecified: Secondary | ICD-10-CM

## 2019-10-24 ENCOUNTER — Ambulatory Visit (INDEPENDENT_AMBULATORY_CARE_PROVIDER_SITE_OTHER): Payer: Medicare HMO

## 2019-10-24 ENCOUNTER — Telehealth: Payer: Self-pay | Admitting: Radiology

## 2019-10-24 ENCOUNTER — Other Ambulatory Visit (INDEPENDENT_AMBULATORY_CARE_PROVIDER_SITE_OTHER): Payer: Medicare HMO

## 2019-10-24 ENCOUNTER — Other Ambulatory Visit: Payer: Self-pay

## 2019-10-24 DIAGNOSIS — E559 Vitamin D deficiency, unspecified: Secondary | ICD-10-CM

## 2019-10-24 DIAGNOSIS — C9110 Chronic lymphocytic leukemia of B-cell type not having achieved remission: Secondary | ICD-10-CM

## 2019-10-24 DIAGNOSIS — E785 Hyperlipidemia, unspecified: Secondary | ICD-10-CM | POA: Diagnosis not present

## 2019-10-24 DIAGNOSIS — Z0000 Encounter for general adult medical examination without abnormal findings: Secondary | ICD-10-CM | POA: Diagnosis not present

## 2019-10-24 DIAGNOSIS — E538 Deficiency of other specified B group vitamins: Secondary | ICD-10-CM | POA: Diagnosis not present

## 2019-10-24 LAB — CBC WITH DIFFERENTIAL/PLATELET
Basophils Absolute: 0.1 10*3/uL (ref 0.0–0.1)
Basophils Relative: 0.4 % (ref 0.0–3.0)
Eosinophils Absolute: 0.5 10*3/uL (ref 0.0–0.7)
Eosinophils Relative: 2 % (ref 0.0–5.0)
HCT: 43.8 % (ref 36.0–46.0)
Hemoglobin: 14.6 g/dL (ref 12.0–15.0)
Lymphocytes Relative: 74.4 % — ABNORMAL HIGH (ref 12.0–46.0)
Lymphs Abs: 17 10*3/uL — ABNORMAL HIGH (ref 0.7–4.0)
MCHC: 33.3 g/dL (ref 30.0–36.0)
MCV: 92.8 fl (ref 78.0–100.0)
Monocytes Absolute: 0.9 10*3/uL (ref 0.1–1.0)
Monocytes Relative: 4 % (ref 3.0–12.0)
Neutro Abs: 4.4 10*3/uL (ref 1.4–7.7)
Neutrophils Relative %: 19.2 % — ABNORMAL LOW (ref 43.0–77.0)
Platelets: 179 10*3/uL (ref 150.0–400.0)
RBC: 4.72 Mil/uL (ref 3.87–5.11)
RDW: 14.5 % (ref 11.5–15.5)
WBC: 22.8 10*3/uL (ref 4.0–10.5)

## 2019-10-24 LAB — COMPREHENSIVE METABOLIC PANEL
ALT: 17 U/L (ref 0–35)
AST: 16 U/L (ref 0–37)
Albumin: 4.3 g/dL (ref 3.5–5.2)
Alkaline Phosphatase: 64 U/L (ref 39–117)
BUN: 11 mg/dL (ref 6–23)
CO2: 29 mEq/L (ref 19–32)
Calcium: 9 mg/dL (ref 8.4–10.5)
Chloride: 106 mEq/L (ref 96–112)
Creatinine, Ser: 0.82 mg/dL (ref 0.40–1.20)
GFR: 68.3 mL/min (ref >60.00)
Glucose, Bld: 99 mg/dL (ref 70–99)
Potassium: 4.2 mEq/L (ref 3.5–5.1)
Sodium: 141 mEq/L (ref 135–145)
Total Bilirubin: 0.6 mg/dL (ref 0.2–1.2)
Total Protein: 6.5 g/dL (ref 6.0–8.3)

## 2019-10-24 LAB — LIPID PANEL
Cholesterol: 145 mg/dL (ref 0–200)
HDL: 45.2 mg/dL (ref >39.00)
NonHDL: 100.16
Total CHOL/HDL Ratio: 3
Triglycerides: 210 mg/dL — ABNORMAL HIGH (ref 0.0–149.0)
VLDL: 42 mg/dL — ABNORMAL HIGH (ref 0.0–40.0)

## 2019-10-24 LAB — VITAMIN B12: Vitamin B-12: 248 pg/mL (ref 211–911)

## 2019-10-24 LAB — VITAMIN D 25 HYDROXY (VIT D DEFICIENCY, FRACTURES): VITD: 33.48 ng/mL (ref 30.00–100.00)

## 2019-10-24 LAB — LDL CHOLESTEROL, DIRECT: Direct LDL: 71 mg/dL

## 2019-10-24 NOTE — Progress Notes (Signed)
Subjective:   Lori Shaw is a 74 y.o. female who presents for Medicare Annual (Subsequent) preventive examination.  Review of Systems: N/A      I connected with the patient today by telephone and verified that I am speaking with the correct person using two identifiers. Location patient: home Location nurse: work Persons participating in the telephone visit: patient, nurse.   I discussed the limitations, risks, security and privacy concerns of performing an evaluation and management service by telephone and the availability of in person appointments. I also discussed with the patient that there may be a patient responsible charge related to this service. The patient expressed understanding and verbally consented to this telephonic visit.        Cardiac Risk Factors include: advanced age (>63men, >76 women);Other (see comment), Risk factor comments: hyperlipidemia     Objective:    Today's Vitals   There is no height or weight on file to calculate BMI.  Advanced Directives 10/24/2019 07/07/2019 01/06/2019 07/29/2018 07/29/2018 07/28/2018 01/06/2018  Does Patient Have a Medical Advance Directive? Yes Yes Yes Yes Yes No No  Type of Paramedic of [place];Living will Healthcare Power of Attorney Living will Living will [place];Living will - -  Does patient want to make changes to medical advance directive? - No - Patient declined No - Patient declined No - Patient declined - - -  Copy of [place] in Chart? Yes - validated most recent copy scanned in chart (See row information) Yes - validated most recent copy scanned in chart (See row information) - No - copy requested No - copy requested - -  Would patient like information on creating a medical advance directive? - - - No - Patient declined - No - Patient declined No - Patient declined    Current Medications (verified) Outpatient Encounter Medications as of  10/24/2019  Medication Sig  . B Complex Vitamins (B COMPLEX 1 PO) Take 1 tablet by mouth daily.  . Cholecalciferol (VITAMIN D3) 50 MCG (2000 UT) TABS Take 1 tablet by mouth daily.  . Cyanocobalamin (VITAMIN B-12) 2500 MCG SUBL Place 1 tablet (2,500 mcg total) under the tongue daily.  . fluticasone (FLONASE) 50 MCG/ACT nasal spray Place 2 sprays into both nostrils daily as needed.  . Multiple Vitamins-Minerals (PRESERVISION AREDS) TABS Take 2 tablets by mouth daily.  . naproxen (NAPROSYN) 500 MG tablet Take 1 tablet (500 mg total) by mouth 2 (two) times daily.  . rosuvastatin (CRESTOR) 10 MG tablet TAKE ONE TABLET BY MOUTH DAILY   No facility-administered encounter medications on file as of 10/24/2019.    Allergies (verified) Sulfa antibiotics   History: Past Medical History:  Diagnosis Date  . B12 deficiency   . Back pain   . CLL (chronic lymphocytic leukemia) ([place]) 06/21/2016   Rai stage 0, dx 06/2016 (Dr Janese Banks)  . HLD (hyperlipidemia)   . Joint pain   . Knee problem   . OSA on CPAP 2006, 04/2014   AHI of 23.8 and an RDI of 42.9, on CPAP 9 cm H2O ([facility])  . Osteopenia 08/2010   T -1.4 at L femur neck  . Piriformis syndrome 12/14/2014  . Vitamin D deficiency    Past Surgical History:  Procedure Laterality Date  . ABDOMINAL HYSTERECTOMY  01/27/1983   for menometrorrhagia, one ovary remained  . CHOLECYSTECTOMY N/A 08/01/2018   LAPAROSCOPIC CHOLECYSTECTOMY; for gallstone pancreatitis  Celine Ahr, Anderson Malta, MD)  . COLONOSCOPY  2012  no records available. per patient normal.   . dexa  08/2010   osteopenia  . TONSILLECTOMY  01/26/1973   Family History  Problem Relation Age of Onset  . Hyperlipidemia Mother   . CAD Mother        CABG  . Hypertension Mother   . Hyperlipidemia Father   . CAD Father        MI  . Sudden death Father   . Arthritis Maternal Grandmother   . Cancer Sister        breast  . Breast cancer Sister 61  . Alzheimer's disease Other   . Heart attack Other     . Diabetes Neg Hx   . Stroke Neg Hx    Social History   Socioeconomic History  . Marital status: Married    Spouse name: Mieko Kneebone  . Number of children: 2  . Years of education: 1.5  . Highest education level: Not on file  Occupational History  . 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 Smoking status: Former Smoker    Packs/day: 2.00    Years: 20.00    Pack years: 40.00    Types: Cigarettes    Quit date: 01/26/1978    Years since quitting: 41.7  . Smokeless tobacco: Never Used  Vaping Use  . Vaping Use: Never used  Substance and Sexual Activity  . Alcohol use: Yes    Comment: occasionally  . Drug use: Never  . Sexual activity: Yes  Other Topics Concern  . Not on file  Social History Narrative   Lives with daughter, 1 dog   Occupation: retired, has worked in Chief Strategy Officer, also Forensic psychologist   Edu: 1.5 yrs college   Activity: square dancing   Diet: good water, fruits/vegetables daily   Patient drinks two cups of caffeine daily.   Remarried.     Social Determinants of Health   Financial Resource Strain: Low Risk   . Difficulty of Paying Living Expenses: Not hard at all  Food Insecurity: No Food Insecurity  . Worried About Charity fundraiser in the Last Year: Never true  . Ran Out of Food in the Last Year: Never true  Transportation Needs: No Transportation Needs  . Lack of Transportation (Medical): No  . Lack of Transportation (Non-Medical): No  Physical Activity: Inactive  . Days of Exercise per Week: 0 days  . Minutes of Exercise per Session: 0 min  Stress: No Stress Concern Present  . Feeling of Stress : Not at all  Social Connections:   . Frequency of Communication with Friends and Family: Not on file  . Frequency of Social Gatherings with Friends and Family: Not on file  . Attends Religious Services: Not on file  . Active Member of Clubs or Organizations: Not on file  . Attends Archivist Meetings: Not  on file  . Marital Status: Not on file    Tobacco Counseling Counseling given: Not Answered   Clinical Intake:  Pre-visit preparation completed: Yes  Pain : No/denies pain     Nutritional Risks: None Diabetes: No  How often do you need to have someone help you when you read instructions, pamphlets, or other written materials from your doctor or pharmacy?: 1 - Never What is the last grade level you completed in school?: [employer]  Diabetic: No Nutrition Risk Assessment:  Has the patient had any N/V/D within the last 2 months?  No  Does the  patient have any non-healing wounds?  No  Has the patient had any unintentional weight loss or weight gain?  No   Diabetes:  Is the patient diabetic?  No  If diabetic, was a CBG obtained today?  N/A Did the patient bring in their glucometer from home?  N/A How often do you monitor your CBG's? N/A.   Financial Strains and Diabetes Management:  Are you having any financial strains with the device, your supplies or your medication? N/A.  Does the patient want to be seen by Chronic Care Management for management of their diabetes?  N/A Would the patient like to be referred to a Nutritionist or for Diabetic Management?  N/A     Interpreter Needed?: No  Information entered by :: CJohnson, LPN   Activities of Daily Living In your present state of health, do you have any difficulty performing the following activities: 10/24/2019  Hearing? N  Vision? N  Difficulty concentrating or making decisions? N  Walking or climbing stairs? N  Dressing or bathing? N  Doing errands, shopping? N  Preparing Food and eating ? N  Using the Toilet? N  In the past six months, have you accidently leaked urine? N  Do you have problems with loss of bowel control? N  Managing your Medications? N  Managing your Finances? N  Housekeeping or managing your Housekeeping? N  Some recent data might be hidden    Patient Care Team: Ria Bush,  MD as PCP - General (Family Medicine) Solon Augusta, MD as Attending Physician (Ophthalmology) Elbert Ewings, DDS as Consulting Physician (Dentistry)  Indicate any recent Medical Services you may have received from other than [facility] providers in the past year (date may be approximate).     Assessment:   This is a routine wellness examination for [name].  Hearing/Vision screen  Hearing Screening   125Hz  250Hz  500Hz  1000Hz  2000Hz  3000Hz  4000Hz  6000Hz  8000Hz   Right ear:           Left ear:           Vision Screening Comments: Patient gets annual eye exams.  Dietary issues and exercise activities discussed: Current Exercise Habits: The patient does not participate in regular exercise at present, Exercise limited by: None identified  Goals    . DIET - INCREASE WATER INTAKE     Starting 07/13/2017, I will attempt to drink 6-8 glasses of water daily.     . Patient Stated     10/24/2019, I will maintain and continue medications as prescribed.       Depression Screen PHQ 2/9 Scores 10/24/2019 07/15/2018 11/25/2017 07/13/2017 06/30/2016 06/06/2015 05/04/2013  PHQ - 2 Score 0 0 4 0 0 0 0  PHQ- 9 Score 0 - 9 0 - - -    Fall Risk Fall Risk  10/24/2019 07/15/2018 07/15/2018 11/02/2017 07/13/2017  Falls in the past year? 1 0 0 No No  Comment fell down steps on deck - - - -  Number falls in past yr: 0 0 - - -  Injury with Fall? 0 0 - - -  Risk for fall due to : No Fall Risks - - - -  Follow up Falls evaluation completed;Falls prevention discussed - - - -    Any stairs in or around the home? Yes  If so, are there any without handrails? No  Home free of loose throw rugs in walkways, pet beds, electrical cords, etc? Yes  Adequate lighting in your home to reduce risk of  falls? Yes   ASSISTIVE DEVICES UTILIZED TO PREVENT FALLS:  Life alert? No  Use of a cane, walker or w/c? No  Grab bars in the bathroom? No  Shower chair or bench in shower? No  Elevated toilet seat or a handicapped toilet? No     TIMED UP AND GO:  Was the test performed? N/A, telephonic visit .    Cognitive Function: MMSE - Mini Mental State Exam 10/24/2019 07/13/2017 06/06/2015  Orientation to time 5 5 5   Orientation to Place 5 5 5   Registration 3 3 3   Attention/ Calculation 5 0 0  Recall 3 3 3   Language- name 2 objects - 0 0  Language- repeat 1 1 1   Language- follow 3 step command - 3 3  Language- read & follow direction - 0 0  Write a sentence - 0 0  Copy design - 0 0  Total score - 20 20  Mini Cog  Mini-Cog screen was completed. Maximum score is 22. A value of 0 denotes this part of the MMSE was not completed or the patient failed this part of the Mini-Cog screening.       Immunizations Immunization History  Administered Date(s) Administered  . Hepatitis A 05/31/1998, 02/19/2006  . Influenza Whole 10/26/2012  . Influenza, High Dose Seasonal PF 12/01/2017, 09/16/2018  . Influenza,inj,Quad PF,6+ Mos 12/14/2014, 11/10/2016  . Influenza-Unspecified 11/04/2013  . Pneumococcal Conjugate-13 06/06/2015  . Pneumococcal Polysaccharide-23 10/26/2012  . Td 01/26/1998  . Tdap 04/01/2006  . Zoster 01/27/2011    TDAP status: Due, Education has been provided regarding the importance of this vaccine. Advised may receive this vaccine at local pharmacy or Health Dept. Aware to provide a copy of the vaccination record if obtained from local pharmacy or Health Dept. Verbalized acceptance and understanding. Flu Vaccine status: due, will get at upcoming office visit  Pneumococcal vaccine status: Up to date Covid-19 vaccine status: Declined, Education has been provided regarding the importance of this vaccine but patient still declined. Advised may receive this vaccine at local pharmacy or Health Dept.or vaccine clinic. Aware to provide a copy of the vaccination record if obtained from local pharmacy or Health Dept. Verbalized acceptance and understanding.  Qualifies for Shingles Vaccine? Yes   Zostavax completed  Yes   Shingrix Completed?: No.    Education has been provided regarding the importance of this vaccine. Patient has been advised to call insurance company to determine out of pocket expense if they have not yet received this vaccine. Advised may also receive vaccine at local pharmacy or Health Dept. Verbalized acceptance and understanding.  Screening Tests Health Maintenance  Topic Date Due  . INFLUENZA VACCINE  08/27/2019  . MAMMOGRAM  08/29/2019  . COLONOSCOPY  10/23/2020 (Originally 01/26/2018)  . COVID-19 Vaccine (1) 11/08/2020 (Originally 08/17/1957)  . TETANUS/TDAP  10/27/2023 (Originally 03/31/2016)  . DEXA SCAN  Completed  . Hepatitis C Screening  Completed  . PNA vac Low Risk Adult  Completed    Health Maintenance  Health Maintenance Due  Topic Date Due  . INFLUENZA VACCINE  08/27/2019  . MAMMOGRAM  08/29/2019    Colorectal cancer screening: declined Mammogram status: due, will call and schedule appointment soon Bone Density status: Completed 08/29/2018. Results reflect: Bone density results: OSTEOPENIA. Repeat every 2 years.  Lung Cancer Screening: (Low Dose CT Chest recommended if Age 60-80 years, 30 pack-year currently smoking OR have quit w/in 15years.) does not qualify.    Additional Screening:  Hepatitis C Screening: does qualify; Completed  06/06/2015  Vision Screening: Recommended annual ophthalmology exams for early detection of glaucoma and other disorders of the eye. Is the patient up to date with their annual eye exam?  Yes  Who is the provider or what is the name of the office in which the patient attends annual eye exams? Dr. Valetta Close   If pt is not established with a provider, would they like to be referred to a provider to establish care? No .   Dental Screening: Recommended annual dental exams for proper oral hygiene  Community Resource Referral / Chronic Care Management: CRR required this visit?  No   CCM required this visit?  No      Plan:     I  have personally reviewed and noted the following in the patient's chart:   . Medical and social history . Use of alcohol, tobacco or illicit drugs  . Current medications and supplements . Functional ability and status . Nutritional status . Physical activity . Advanced directives . List of other physicians . Hospitalizations, surgeries, and ER visits in previous 12 months . Vitals . Screenings to include cognitive, depression, and falls . Referrals and appointments  In addition, I have reviewed and discussed with patient certain preventive protocols, quality metrics, and best practice recommendations. A written personalized care plan for preventive services as well as general preventive health recommendations were provided to patient.   Due to this being a telephonic visit, the after visit summary with patients personalized plan was offered to patient via office or my-chart. Patient preferred to pick up at office at next visit or via mychart.   Andrez Grime, LPN   02/04/2109

## 2019-10-24 NOTE — Telephone Encounter (Signed)
Noted. Known CLL.

## 2019-10-24 NOTE — Progress Notes (Signed)
PCP notes:  Health Maintenance: Flu- due Tdap- insurance Covid- declined Colonoscopy- declined Mammogram- due, Patient will call and schedule appointment soon   Abnormal Screenings: none   Patient concerns: none   Nurse concerns: none   Next PCP appt.: 10/31/2019 @ 10:30 am

## 2019-10-24 NOTE — Telephone Encounter (Signed)
[facility] called a critical WBC - 22.8. Results given to Dr Danise Mina

## 2019-10-24 NOTE — Patient Instructions (Signed)
Lori Shaw , Thank you for taking time to come for your Medicare Wellness Visit. I appreciate your ongoing commitment to your health goals. Please review the following plan we discussed and let me know if I can assist you in the future.   Screening recommendations/referrals: Colonoscopy: declined Mammogram: due, will call and schedule appointment soon Bone Density: Up to date, completed 08/29/2018, due 08/2020 Recommended yearly ophthalmology/optometry visit for glaucoma screening and checkup Recommended yearly dental visit for hygiene and checkup  Vaccinations: Influenza vaccine: due, will get at upcoming physical Pneumococcal vaccine: Completed series Tdap vaccine: decline- insurance Shingles vaccine: due, check with your insurance regarding coverage if interested   Covid-19:declined  Advanced directives: copy in chart  Conditions/risks identified: hyperlipidemia  Next appointment: Follow up in one year for your annual wellness visit    Preventive Care 65 Years and Older, Female Preventive care refers to lifestyle choices and visits with your health care provider that can promote health and wellness. What does preventive care include?  A yearly physical exam. This is also called an annual well check.  Dental exams once or twice a year.  Routine eye exams. Ask your health care provider how often you should have your eyes checked.  Personal lifestyle choices, including:  Daily care of your teeth and gums.  Regular physical activity.  Eating a healthy diet.  Avoiding tobacco and drug use.  Limiting alcohol use.  Practicing safe sex.  Taking low-dose aspirin every day.  Taking vitamin and mineral supplements as recommended by your health care provider. What happens during an annual well check? The services and screenings done by your health care provider during your annual well check will depend on your age, overall health, lifestyle risk factors, and family history of  disease. Counseling  Your health care provider may ask you questions about your:  Alcohol use.  Tobacco use.  Drug use.  Emotional well-being.  Home and relationship well-being.  Sexual activity.  Eating habits.  History of falls.  Memory and ability to understand (cognition).  Work and work Statistician.  Reproductive health. Screening  You may have the following tests or measurements:  Height, weight, and BMI.  Blood pressure.  Lipid and cholesterol levels. These may be checked every 5 years, or more frequently if you are over 74 years old.  Skin check.  Lung cancer screening. You may have this screening every year starting at age 70 if you have a 30-pack-year history of smoking and currently smoke or have quit within the past 15 years.  Fecal occult blood test (FOBT) of the stool. You may have this test every year starting at age 78.  Flexible sigmoidoscopy or colonoscopy. You may have a sigmoidoscopy every 5 years or a colonoscopy every 10 years starting at age 5.  Hepatitis C blood test.  Hepatitis B blood test.  Sexually transmitted disease (STD) testing.  Diabetes screening. This is done by checking your blood sugar (glucose) after you have not eaten for a while (fasting). You may have this done every 1-3 years.  Bone density scan. This is done to screen for osteoporosis. You may have this done starting at age 33.  Mammogram. This may be done every 1-2 years. Talk to your health care provider about how often you should have regular mammograms. Talk with your health care provider about your test results, treatment options, and if necessary, the need for more tests. Vaccines  Your health care provider may recommend certain vaccines, such as:  Influenza vaccine.  This is recommended every year.  Tetanus, diphtheria, and acellular pertussis (Tdap, Td) vaccine. You may need a Td booster every 10 years.  Zoster vaccine. You may need this after age  40.  Pneumococcal 13-valent conjugate (PCV13) vaccine. One dose is recommended after age 73.  Pneumococcal polysaccharide (PPSV23) vaccine. One dose is recommended after age 31. Talk to your health care provider about which screenings and vaccines you need and how often you need them. This information is not intended to replace advice given to you by your health care provider. Make sure you discuss any questions you have with your health care provider. Document Released: 02/08/2015 Document Revised: 10/02/2015 Document Reviewed: 11/13/2014 Elsevier Interactive Patient Education  2017 [place] Prevention in the Home Falls can cause injuries. They can happen to people of all ages. There are many things you can do to make your home safe and to help prevent falls. What can I do on the outside of my home?  Regularly fix the edges of walkways and driveways and fix any cracks.  Remove anything that might make you trip as you walk through a door, such as a raised step or threshold.  Trim any bushes or trees on the path to your home.  Use bright outdoor lighting.  Clear any walking paths of anything that might make someone trip, such as rocks or tools.  Regularly check to see if handrails are loose or broken. Make sure that both sides of any steps have handrails.  Any raised decks and porches should have guardrails on the edges.  Have any leaves, snow, or ice cleared regularly.  Use sand or salt on walking paths during winter.  Clean up any spills in your garage right away. This includes oil or grease spills. What can I do in the bathroom?  Use night lights.  Install grab bars by the toilet and in the tub and shower. Do not use towel bars as grab bars.  Use non-skid mats or decals in the tub or shower.  If you need to sit down in the shower, use a plastic, non-slip stool.  Keep the floor dry. Clean up any water that spills on the floor as soon as it happens.  Remove  soap buildup in the tub or shower regularly.  Attach bath mats securely with double-sided non-slip rug tape.  Do not have throw rugs and other things on the floor that can make you trip. What can I do in the bedroom?  Use night lights.  Make sure that you have a light by your bed that is easy to reach.  Do not use any sheets or blankets that are too big for your bed. They should not hang down onto the floor.  Have a firm chair that has side arms. You can use this for support while you get dressed.  Do not have throw rugs and other things on the floor that can make you trip. What can I do in the kitchen?  Clean up any spills right away.  Avoid walking on wet floors.  Keep items that you use a lot in easy-to-reach places.  If you need to reach something above you, use a strong step stool that has a grab bar.  Keep electrical cords out of the way.  Do not use floor polish or wax that makes floors slippery. If you must use wax, use non-skid floor wax.  Do not have throw rugs and other things on the floor that can make  you trip. What can I do with my stairs?  Do not leave any items on the stairs.  Make sure that there are handrails on both sides of the stairs and use them. Fix handrails that are broken or loose. Make sure that handrails are as long as the stairways.  Check any carpeting to make sure that it is firmly attached to the stairs. Fix any carpet that is loose or worn.  Avoid having throw rugs at the top or bottom of the stairs. If you do have throw rugs, attach them to the floor with carpet tape.  Make sure that you have a light switch at the top of the stairs and the bottom of the stairs. If you do not have them, ask someone to add them for you. What else can I do to help prevent falls?  Wear shoes that:  Do not have high heels.  Have rubber bottoms.  Are comfortable and fit you well.  Are closed at the toe. Do not wear sandals.  If you use a  stepladder:  Make sure that it is fully opened. Do not climb a closed stepladder.  Make sure that both sides of the stepladder are locked into place.  Ask someone to hold it for you, if possible.  Clearly mark and make sure that you can see:  Any grab bars or handrails.  First and last steps.  Where the edge of each step is.  Use tools that help you move around (mobility aids) if they are needed. These include:  Canes.  Walkers.  Scooters.  Crutches.  Turn on the lights when you go into a dark area. Replace any light bulbs as soon as they burn out.  Set up your furniture so you have a clear path. Avoid moving your furniture around.  If any of your floors are uneven, fix them.  If there are any pets around you, be aware of where they are.  Review your medicines with your doctor. Some medicines can make you feel dizzy. This can increase your chance of falling. Ask your doctor what other things that you can do to help prevent falls. This information is not intended to replace advice given to you by your health care provider. Make sure you discuss any questions you have with your health care provider. Document Released: 11/08/2008 Document Revised: 06/20/2015 Document Reviewed: 02/16/2014 Elsevier Interactive Patient Education  2017 [employer].

## 2019-10-31 ENCOUNTER — Other Ambulatory Visit: Payer: Self-pay

## 2019-10-31 ENCOUNTER — Encounter: Payer: Self-pay | Admitting: Family Medicine

## 2019-10-31 ENCOUNTER — Ambulatory Visit (INDEPENDENT_AMBULATORY_CARE_PROVIDER_SITE_OTHER): Payer: Medicare HMO | Admitting: Family Medicine

## 2019-10-31 VITALS — BP 118/68 | HR 63 | Temp 97.8°F | Ht 65.0 in | Wt 206.5 lb

## 2019-10-31 DIAGNOSIS — E785 Hyperlipidemia, unspecified: Secondary | ICD-10-CM

## 2019-10-31 DIAGNOSIS — J302 Other seasonal allergic rhinitis: Secondary | ICD-10-CM

## 2019-10-31 DIAGNOSIS — E669 Obesity, unspecified: Secondary | ICD-10-CM | POA: Diagnosis not present

## 2019-10-31 DIAGNOSIS — H353221 Exudative age-related macular degeneration, left eye, with active choroidal neovascularization: Secondary | ICD-10-CM | POA: Diagnosis not present

## 2019-10-31 DIAGNOSIS — E538 Deficiency of other specified B group vitamins: Secondary | ICD-10-CM

## 2019-10-31 DIAGNOSIS — E559 Vitamin D deficiency, unspecified: Secondary | ICD-10-CM | POA: Diagnosis not present

## 2019-10-31 DIAGNOSIS — Z0001 Encounter for general adult medical examination with abnormal findings: Secondary | ICD-10-CM | POA: Diagnosis not present

## 2019-10-31 DIAGNOSIS — C9110 Chronic lymphocytic leukemia of B-cell type not having achieved remission: Secondary | ICD-10-CM

## 2019-10-31 DIAGNOSIS — M8580 Other specified disorders of bone density and structure, unspecified site: Secondary | ICD-10-CM

## 2019-10-31 DIAGNOSIS — Z1211 Encounter for screening for malignant neoplasm of colon: Secondary | ICD-10-CM | POA: Diagnosis not present

## 2019-10-31 MED ORDER — FLUTICASONE PROPIONATE 50 MCG/ACT NA SUSP: 2.0000 | Freq: Every day | NASAL | 3 refills | Status: DC | PRN

## 2019-10-31 MED ORDER — VITAMIN D 125 MCG (5000 UT) PO CAPS: 1.0000 | ORAL_CAPSULE | Freq: Every day | ORAL | Status: AC

## 2019-10-31 MED ORDER — ROSUVASTATIN CALCIUM 10 MG PO TABS
10.0000 mg | ORAL_TABLET | Freq: Every day | ORAL | 3 refills | Status: DC
Start: 2019-10-31 — End: 2021-05-22

## 2019-10-31 MED ORDER — CYANOCOBALAMIN 1000 MCG/ML IJ SOLN
1000.0000 ug | Freq: Once | INTRAMUSCULAR | Status: AC
  Administered 2019-10-31: 1000 ug via INTRAMUSCULAR

## 2019-10-31 NOTE — Assessment & Plan Note (Signed)
Borderline low levels despite regular 2563mcg daily oral dosing. Suggested she look for 5083mcg daily dose, will treat with b12 shot today.

## 2019-10-31 NOTE — Assessment & Plan Note (Signed)
Appreciate onc care. WBCs chronically elevated.

## 2019-10-31 NOTE — Assessment & Plan Note (Signed)
She has increased dose to 5000 IU daily.

## 2019-10-31 NOTE — Patient Instructions (Addendum)
Pass by lab to pick up stool kit.  Inquire about higher dose B12 tablets (5020mcg daily) as your levels were low normal. B12 shot today.  If interested, check with pharmacy about new 2 shot shingles series (shingrix).  I do recommend COVID vaccine.  Return as needed or in 1 year for next physical.   Health Maintenance After Age 73 After age 70, you are at a higher risk for certain long-term diseases and infections as well as injuries from falls. Falls are a major cause of broken bones and head injuries in people who are older than age 16. Getting regular preventive care can help to keep you healthy and well. Preventive care includes getting regular testing and making lifestyle changes as recommended by your health care provider. Talk with your health care provider about:  Which screenings and tests you should have. A screening is a test that checks for a disease when you have no symptoms.  A diet and exercise plan that is right for you. What should I know about screenings and tests to prevent falls? Screening and testing are the best ways to find a health problem early. Early diagnosis and treatment give you the best chance of managing medical conditions that are common after age 64. Certain conditions and lifestyle choices may make you more likely to have a fall. Your health care provider may recommend:  Regular vision checks. Poor vision and conditions such as cataracts can make you more likely to have a fall. If you wear glasses, make sure to get your prescription updated if your vision changes.  Medicine review. Work with your health care provider to regularly review all of the medicines you are taking, including over-the-counter medicines. Ask your health care provider about any side effects that may make you more likely to have a fall. Tell your health care provider if any medicines that you take make you feel dizzy or sleepy.  Osteoporosis screening. Osteoporosis is a condition that causes  the bones to get weaker. This can make the bones weak and cause them to break more easily.  Blood pressure screening. Blood pressure changes and medicines to control blood pressure can make you feel dizzy.  Strength and balance checks. Your health care provider may recommend certain tests to check your strength and balance while standing, walking, or changing positions.  Foot health exam. Foot pain and numbness, as well as not wearing proper footwear, can make you more likely to have a fall.  Depression screening. You may be more likely to have a fall if you have a fear of falling, feel emotionally low, or feel unable to do activities that you used to do.  Alcohol use screening. Using too much alcohol can affect your balance and may make you more likely to have a fall. What actions can I take to lower my risk of falls? General instructions  Talk with your health care provider about your risks for falling. Tell your health care provider if: ? You fall. Be sure to tell your health care provider about all falls, even ones that seem minor. ? You feel dizzy, sleepy, or off-balance.  Take over-the-counter and prescription medicines only as told by your health care provider. These include any supplements.  Eat a healthy diet and maintain a healthy weight. A healthy diet includes low-fat dairy products, low-fat (lean) meats, and fiber from whole grains, beans, and lots of fruits and vegetables. Home safety  Remove any tripping hazards, such as rugs, cords, and clutter.  Install safety equipment such as grab bars in bathrooms and safety rails on stairs.  Keep rooms and walkways well-lit. Activity   Follow a regular exercise program to stay fit. This will help you maintain your balance. Ask your health care provider what types of exercise are appropriate for you.  If you need a cane or walker, use it as recommended by your health care provider.  Wear supportive shoes that have nonskid  soles. Lifestyle  Do not drink alcohol if your health care provider tells you not to drink.  If you drink alcohol, limit how much you have: ? 0-1 drink a day for women. ? 0-2 drinks a day for men.  Be aware of how much alcohol is in your drink. In the [place] one drink equals one typical bottle of beer (12 oz), one-half glass of wine (5 oz), or one shot of hard liquor (1 oz).  Do not use any products that contain nicotine or tobacco, such as cigarettes and e-cigarettes. If you need help quitting, ask your health care provider. Summary  Having a healthy lifestyle and getting preventive care can help to protect your health and wellness after age 6.  Screening and testing are the best way to find a health problem early and help you avoid having a fall. Early diagnosis and treatment give you the best chance for managing medical conditions that are more common for people who are older than age 55.  Falls are a major cause of broken bones and head injuries in people who are older than age 30. Take precautions to prevent a fall at home.  Work with your health care provider to learn what changes you can make to improve your health and wellness and to prevent falls. This information is not intended to replace advice given to you by your health care provider. Make sure you discuss any questions you have with your health care provider. Document Revised: 05/05/2018 Document Reviewed: 11/25/2016 Elsevier Patient Education  2020 [employer].

## 2019-10-31 NOTE — Assessment & Plan Note (Signed)
Chronic, stable on crestor - continue The 10-year ASCVD risk score Mikey Bussing DC Jr., et al., 2013) is: 10.8%   Values used to calculate the score:     Age: 74 years     Sex: Female     Is Non-Hispanic African American: No     Diabetic: No     Tobacco smoker: No     Systolic Blood Pressure: 239 mmHg     Is BP treated: No     HDL Cholesterol: 45.2 mg/dL     Total Cholesterol: 145 mg/dL

## 2019-10-31 NOTE — Assessment & Plan Note (Signed)
Continues preservision AREDS vitamins, receiving Q10 wk injection into eyes through eye doctor.

## 2019-10-31 NOTE — Assessment & Plan Note (Addendum)
Preventative protocols reviewed and updated unless pt declined. Discussed healthy diet and lifestyle.  Good portion of visit spent discussing COVID vaccine

## 2019-10-31 NOTE — Assessment & Plan Note (Signed)
Reviewed latest DEXA 2020, consider rpt in 2 yrs given borderline osteoporosis. Reviewed cal, vit d, regular weight bearing exercise.

## 2019-10-31 NOTE — Assessment & Plan Note (Signed)
Weight gain noted. No longer seeing [facility].

## 2019-10-31 NOTE — Progress Notes (Signed)
This visit was conducted in person.  BP 118/68 (BP Location: Left Arm, Patient Position: Sitting, Cuff Size: Large)   Pulse 63   Temp 97.8 F (36.6 C) (Temporal)   Ht _0  (1.651 m)   Wt 206 lb 8 oz (93.7 kg)   SpO2 98%   BMI 34.36 kg/m    CC: CPE Subjective:    Patient ID: Lori Shaw, female    DOB: 1945/12/04, 74 y.o.   MRN: [id number]  HPI: Lori Shaw is a 74 y.o. female presenting on 10/31/2019 for Annual Exam (Prt 2. )   Saw health advisor last week for medicare wellness visit. Note reviewed.   No exam data present    Clinical Support from 10/24/2019 in [place] at [facility] Total Score 0      Fall Risk  10/24/2019 07/15/2018 07/15/2018 11/02/2017 07/13/2017  Falls in the past year? 1 0 0 No No  Comment fell down steps on deck - - - -  Number falls in past yr: 0 0 - - -  Injury with Fall? 0 0 - - -  Risk for fall due to : No Fall Risks - - - -  Follow up Falls evaluation completed;Falls prevention discussed - - - -  Fall in summer slipped on deck with leg injury s/p evaluation at ER.   Known CLL followed by Dr Randa Evens, B12 deficiency on SL 2538mg daily.   Preventative: Colon cancer screening - ~2010 per pt normal, rec rpt 10 yrs- [place]- [place]. Declines repeat colonoscopy, agrees to iFOB today.  Well woman exam - s/p hysterectomy1985 with 1 ovary remaining.No longer receives pap smears. Mammogram -08/2018 [facility] - will call for rpt Dexa Date: 08/2010 osteopenia.DEXA spine -1.7, hip -1.7, forearm -2.4. Drinks milk and eats yogurt daily. h/o low vit D in past. She does get weight bearing exercise.  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 Seat belt use discussed Sunscreen use discussed. No changing moles on skin.  Ex-smoker quit 1980. Daughter smokes at home. Alcohol - abstinent  Dentist Q6 mo  Eye exam Q2 mo - shot in eye for macular degeneration  Bowels - no constipation  Bladder - no incontinence  Lives with daughter, 1 dog  Occupation: retired, has worked in hChief Strategy Officer also mForensic psychologist Edu: 1.5 yrs college  Activity:stays active in yard Diet: good water, fruits/vegetables daily. Working on [employer]     Relevant past medical, surgical, family and social history reviewed and updated as indicated. Interim medical history since our last visit reviewed. Allergies and medications reviewed and updated. Outpatient Medications Prior to Visit  Medication Sig Dispense Refill  . B Complex Vitamins (B COMPLEX 1 PO) Take 1 tablet by mouth daily.    . Cyanocobalamin (VITAMIN B-12) 2500 MCG SUBL Place 1 tablet (2,500 mcg total) under the tongue daily.  0  . Multiple Vitamins-Minerals (PRESERVISION AREDS) TABS Take 2 tablets by mouth daily.    . Cholecalciferol (VITAMIN D3) 50 MCG (2000 UT) TABS Take 1 tablet by mouth daily.    . fluticasone (FLONASE) 50 MCG/ACT nasal spray Place 2 sprays into both nostrils daily as needed. 16 g  3  . rosuvastatin (CRESTOR) 10 MG tablet TAKE ONE TABLET BY MOUTH DAILY 90 tablet 0  . naproxen (NAPROSYN) 500 MG tablet Take 1 tablet (500 mg total) by mouth 2 (two) times daily. 30 tablet 0   No facility-administered medications prior to visit.     Per HPI unless specifically indicated in ROS section below Review of Systems  Constitutional: Negative for activity change, appetite change, chills, fatigue, fever and unexpected weight change.  HENT: Negative for hearing loss.   Eyes: Negative for visual disturbance.  Respiratory: Negative for cough, chest tightness, shortness of breath and wheezing.   Cardiovascular: Negative for chest  pain, palpitations and leg swelling.  Gastrointestinal: Negative for abdominal distention, abdominal pain, blood in stool, constipation, diarrhea, nausea and vomiting.  Genitourinary: Negative for difficulty urinating and hematuria.  Musculoskeletal: Negative for arthralgias, myalgias and neck pain.  Skin: Negative for rash.  Neurological: Negative for dizziness, seizures, syncope and headaches.  Hematological: Negative for adenopathy. Does not bruise/bleed easily.  Psychiatric/Behavioral: Negative for dysphoric mood. The patient is not nervous/anxious.    Objective:  BP 118/68 (BP Location: Left Arm, Patient Position: Sitting, Cuff Size: Large)   Pulse 63   Temp 97.8 F (36.6 C) (Temporal)   Ht _0  (1.651 m)   Wt 206 lb 8 oz (93.7 kg)   SpO2 98%   BMI 34.36 kg/m   Wt Readings from Last 3 Encounters:  10/31/19 206 lb 8 oz (93.7 kg)  07/07/19 203 lb 12.8 oz (92.4 kg)  03/31/19 199 lb 5 oz (90.4 kg)      Physical Exam Vitals and nursing note reviewed.  Constitutional:      General: She is not in acute distress.    Appearance: Normal appearance. She is well-developed. She is obese. She is not ill-appearing.  HENT:     Head: Normocephalic and atraumatic.     Right Ear: Hearing, tympanic membrane, ear canal and external ear normal.     Left Ear: Hearing, tympanic membrane, ear canal and external ear normal.  Eyes:     General: No scleral icterus.    Extraocular Movements: Extraocular movements intact.     Conjunctiva/sclera: Conjunctivae normal.     Pupils: Pupils are equal, round, and reactive to light.  Neck:     Thyroid: No thyroid mass or thyromegaly.     Vascular: No carotid bruit.  Cardiovascular:     Rate and Rhythm: Normal rate and regular rhythm.     Pulses: Normal pulses.          Radial pulses are 2+ on the right side and 2+ on the left side.     Heart sounds: Normal heart sounds. No murmur heard.   Pulmonary:     Effort: Pulmonary effort is normal. No  respiratory distress.     Breath sounds: Normal breath sounds. No wheezing, rhonchi or rales.  Abdominal:     General: Abdomen is flat. Bowel sounds are normal. There is no distension.     Palpations: Abdomen is soft. There is no mass.     Tenderness: There is no abdominal tenderness. There is no guarding or rebound.     Hernia: No hernia is present.  Musculoskeletal:        General: Normal range of motion.     Cervical back: Normal range of motion and neck supple.     Right lower leg: No edema.     Left lower leg: No edema.  Lymphadenopathy:  Cervical: No cervical adenopathy.  Skin:    General: Skin is warm and dry.     Findings: No rash.  Neurological:     General: No focal deficit present.     Mental Status: She is alert and oriented to person, place, and time.     Comments: CN grossly intact, station and gait intact  Psychiatric:        Mood and Affect: Mood normal.        Behavior: Behavior normal.        Thought Content: Thought content normal.        Judgment: Judgment normal.       Results for orders placed or performed in visit on 10/24/19  VITAMIN D 25 Hydroxy (Vit-D Deficiency, Fractures)  Result Value Ref Range   VITD 33.48 30.00 - 100.00 ng/mL  Vitamin B12  Result Value Ref Range   Vitamin B-12 248 211 - 911 pg/mL  CBC with Differential/Platelet  Result Value Ref Range   WBC 22.8 Repeated and verified X2. (HH) 4.0 - 10.5 K/uL   RBC 4.72 3.87 - 5.11 Mil/uL   Hemoglobin 14.6 12.0 - 15.0 g/dL   HCT 43.8 36 - 46 %   MCV 92.8 78.0 - 100.0 fl   MCHC 33.3 30.0 - 36.0 g/dL   RDW 14.5 11.5 - 15.5 %   Platelets 179.0 150 - 400 K/uL   Neutrophils Relative % 19.2 (L) 43 - 77 %   Lymphocytes Relative 74.4 (H) 12 - 46 %   Monocytes Relative 4.0 3 - 12 %   Eosinophils Relative 2.0 0 - 5 %   Basophils Relative 0.4 0 - 3 %   Neutro Abs 4.4 1.4 - 7.7 K/uL   Lymphs Abs 17.0 (H) 0.7 - 4.0 K/uL   Monocytes Absolute 0.9 0 - 1 K/uL   Eosinophils Absolute 0.5 0 - 0 K/uL     Basophils Absolute 0.1 0 - 0 K/uL  Comprehensive metabolic panel  Result Value Ref Range   Sodium 141 135 - 145 mEq/L   Potassium 4.2 3.5 - 5.1 mEq/L   Chloride 106 96 - 112 mEq/L   CO2 29 19 - 32 mEq/L   Glucose, Bld 99 70 - 99 mg/dL   BUN 11 6 - 23 mg/dL   Creatinine, Ser 0.82 0.40 - 1.20 mg/dL   Total Bilirubin 0.6 0.2 - 1.2 mg/dL   Alkaline Phosphatase 64 39 - 117 U/L   AST 16 0 - 37 U/L   ALT 17 0 - 35 U/L   Total Protein 6.5 6.0 - 8.3 g/dL   Albumin 4.3 3.5 - 5.2 g/dL   GFR 68.30 >60.00 mL/min   Calcium 9.0 8.4 - 10.5 mg/dL  Lipid panel  Result Value Ref Range   Cholesterol 145 0 - 200 mg/dL   Triglycerides 210.0 (H) 0 - 149 mg/dL   HDL 45.20 >39.00 mg/dL   VLDL 42.0 (H) 0.0 - 40.0 mg/dL   Total CHOL/HDL Ratio 3    NonHDL 100.16   LDL cholesterol, direct  Result Value Ref Range   Direct LDL 71.0 mg/dL   Assessment & Plan:  This visit occurred during the SARS-CoV-2 public health emergency.  Safety protocols were in place, including screening questions prior to the visit, additional usage of staff PPE, and extensive cleaning of exam room while observing appropriate contact time as indicated for disinfecting solutions.   Problem List Items Addressed This Visit    Vitamin D deficiency  She has increased dose to 5000 IU daily.       Vitamin B12 deficiency    Borderline low levels despite regular 2525mcg daily oral dosing. Suggested she look for 5044mcg daily dose, will treat with b12 shot today.       Seasonal allergic rhinitis   Relevant Medications   fluticasone (FLONASE) 50 MCG/ACT nasal spray   Osteopenia    Reviewed latest DEXA 2020, consider rpt in 2 yrs given borderline osteoporosis. Reviewed cal, vit d, regular weight bearing exercise.       Obesity, Class I, BMI 30-34.9    Weight gain noted. No longer seeing healthy weight and wellness center.       HLD (hyperlipidemia)    Chronic, stable on crestor - continue The 10-year ASCVD risk score Mikey Bussing DC  Jr., et al., 2013) is: 10.8%   Values used to calculate the score:     Age: 56 years     Sex: Female     Is Non-Hispanic African American: No     Diabetic: No     Tobacco smoker: No     Systolic Blood Pressure: 696 mmHg     Is BP treated: No     HDL Cholesterol: 45.2 mg/dL     Total Cholesterol: 145 mg/dL       Relevant Medications   rosuvastatin (CRESTOR) 10 MG tablet   Exudative age-related macular degeneration of left eye with active choroidal neovascularization (HCC)    Continues preservision AREDS vitamins, receiving Q10 wk injection into eyes through eye doctor.       Relevant Medications   rosuvastatin (CRESTOR) 10 MG tablet   Encounter for general adult medical examination with abnormal findings - Primary    Preventative protocols reviewed and updated unless pt declined. Discussed healthy diet and lifestyle.  Good portion of visit spent discussing COVID vaccine       CLL (chronic lymphocytic leukemia) ([place])    Appreciate onc care. WBCs chronically elevated.        Other Visit Diagnoses    Special screening for malignant neoplasms, colon       Relevant Orders   Fecal occult blood, imunochemical       Meds ordered this encounter  Medications  . rosuvastatin (CRESTOR) 10 MG tablet    Sig: Take 1 tablet (10 mg total) by mouth daily.    Dispense:  90 tablet    Refill:  3  . fluticasone (FLONASE) 50 MCG/ACT nasal spray    Sig: Place 2 sprays into both nostrils daily as needed.    Dispense:  16 g    Refill:  3  . Cholecalciferol (VITAMIN D) 125 MCG (5000 UT) CAPS    Sig: Take 1 capsule by mouth daily.    Dispense:  30 capsule  . cyanocobalamin ((VITAMIN B-12)) injection 1,000 mcg   Orders Placed This Encounter  Procedures  . Fecal occult blood, imunochemical    Standing Status:   Future    Standing Expiration Date:   10/30/2020    Patient instructions: Pass by lab to pick up stool kit.  Inquire about higher dose B12 tablets (5033mcg daily) as your levels  were low normal. B12 shot today.  If interested, check with pharmacy about new 2 shot shingles series (shingrix).  I do recommend COVID vaccine.  Return as needed or in 1 year for next physical.   Follow up plan: Return in about 1 year (around 10/30/2020) for annual exam, prior fasting for blood work, Information systems manager  wellness visit.  Ria Bush, MD

## 2019-11-02 ENCOUNTER — Other Ambulatory Visit (INDEPENDENT_AMBULATORY_CARE_PROVIDER_SITE_OTHER): Payer: Medicare HMO

## 2019-11-02 DIAGNOSIS — Z1211 Encounter for screening for malignant neoplasm of colon: Secondary | ICD-10-CM

## 2019-11-02 LAB — FECAL OCCULT BLOOD, IMMUNOCHEMICAL: Fecal Occult Bld: NEGATIVE

## 2019-11-02 LAB — FECAL OCCULT BLOOD, GUAIAC: Fecal Occult Blood: NEGATIVE

## 2019-11-03 ENCOUNTER — Encounter: Payer: Self-pay | Admitting: Family Medicine

## 2019-11-08 ENCOUNTER — Encounter: Payer: Self-pay | Admitting: Adult Health

## 2019-11-08 ENCOUNTER — Other Ambulatory Visit: Payer: Self-pay

## 2019-11-08 ENCOUNTER — Ambulatory Visit: Payer: Medicare HMO | Admitting: Adult Health

## 2019-11-08 VITALS — BP 110/68 | Ht 65.0 in | Wt 208.0 lb

## 2019-11-08 DIAGNOSIS — G4733 Obstructive sleep apnea (adult) (pediatric): Secondary | ICD-10-CM

## 2019-11-08 DIAGNOSIS — Z9989 Dependence on other enabling machines and devices: Secondary | ICD-10-CM

## 2019-11-08 NOTE — Patient Instructions (Signed)
Continue using CPAP nightly and greater than 4 hours each night °If your symptoms worsen or you develop new symptoms please let us know.  ° °

## 2019-11-08 NOTE — Progress Notes (Signed)
PATIENT: [place] Haisley DOB: [birth year]  REASON FOR VISIT: follow up HISTORY FROM: patient  HISTORY OF PRESENT ILLNESS: Today 11/08/19:  Ms. Gaulin is a 73 year old female with a history of obstructive sleep apnea on CPAP.  Her download indicates that she use her machine nightly for compliance of 100%.  She use her machine greater than 4 hours each night.  On average she uses her machine 8 hours and 21 minutes.  Her residual AHI is 1.5 on 9 cm of water with EPR 2.  She does not have a significant leak.  She reports that the CPAP is working well for her.  She returns today for an evaluation.  HISTORY 11/08/18:  Ms. Apt is a 74 year old female with a history of obstructive sleep apnea on CPAP.  Her download indicates that she used her machine 30 out of 30 days for compliance of 100%.  She use her machine greater than 4 hours each night.  On average she uses her machine 8 hours and 54 minutes.  Her residual AHI is 1.2 on 9 cm of water with EPR 2.  She reports that the CPAP is working well for her.  She states that she has noticed that she has been having a hard time sleeping.  She does not think it is anything to do with the CPAP.  She feels that it is related to environmental changes specifically Covid.  She was wondering if she could try melatonin.  REVIEW OF SYSTEMS: Out of a complete 14 system review of symptoms, the patient complains only of the following symptoms, and all other reviewed systems are negative.   ESS 4  ALLERGIES: Allergies  Allergen Reactions  . Sulfa Antibiotics Nausea Only and Nausea And Vomiting    HOME MEDICATIONS: Outpatient Medications Prior to Visit  Medication Sig Dispense Refill  . B Complex Vitamins (B COMPLEX 1 PO) Take 1 tablet by mouth daily.    . Cholecalciferol (VITAMIN D) 125 MCG (5000 UT) CAPS Take 1 capsule by mouth daily. 30 capsule   . Cyanocobalamin (VITAMIN B-12) 2500 MCG SUBL Place 1 tablet (2,500 mcg total) under the tongue  daily.  0  . fluticasone (FLONASE) 50 MCG/ACT nasal spray Place 2 sprays into both nostrils daily as needed. 16 g 3  . Multiple Vitamins-Minerals (PRESERVISION AREDS) TABS Take 2 tablets by mouth daily.    . rosuvastatin (CRESTOR) 10 MG tablet Take 1 tablet (10 mg total) by mouth daily. 90 tablet 3   No facility-administered medications prior to visit.    PAST MEDICAL HISTORY: Past Medical History:  Diagnosis Date  . B12 deficiency   . Back pain   . CLL (chronic lymphocytic leukemia) ([place]) 06/21/2016   Rai stage 0, dx 06/2016 (Dr Janese Banks)  . HLD (hyperlipidemia)   . Joint pain   . Knee problem   . OSA on CPAP 2006, 04/2014   AHI of 23.8 and an RDI of 42.9, on CPAP 9 cm H2O ([place])  . Osteopenia 08/2010   T -1.4 at L femur neck  . Piriformis syndrome 12/14/2014  . Vitamin D deficiency     PAST SURGICAL HISTORY: Past Surgical History:  Procedure Laterality Date  . ABDOMINAL HYSTERECTOMY  01/27/1983   for menometrorrhagia, one ovary remained  . CHOLECYSTECTOMY N/A 08/01/2018   LAPAROSCOPIC CHOLECYSTECTOMY; for gallstone pancreatitis  Celine Ahr, Anderson Malta, MD)  . COLONOSCOPY  2012   no records available. per patient normal.   . dexa  08/2010   osteopenia  .  TONSILLECTOMY  01/26/1973    FAMILY HISTORY: Family History  Problem Relation Age of Onset  . Hyperlipidemia Mother   . CAD Mother        CABG  . Hypertension Mother   . Hyperlipidemia Father   . CAD Father        MI  . Sudden death Father   . Arthritis Maternal Grandmother   . Cancer Sister        breast  . Breast cancer Sister 53  . Alzheimer's disease Other   . Heart attack Other   . Diabetes Neg Hx   . Stroke Neg Hx     SOCIAL HISTORY: Social History   Socioeconomic History  . Marital status: Married    Spouse name: Ruthetta Koopmann  . Number of children: 2  . Years of education: 1.5  . Highest education level: Not on file  Occupational History  . 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 Smoking status: Former Smoker    Packs/day: 2.00    Years: 20.00    Pack years: 40.00    Types: Cigarettes    Quit date: 01/26/1978    Years since quitting: 41.8  . Smokeless tobacco: Never Used  Vaping Use  . Vaping Use: Never used  Substance and Sexual Activity  . Alcohol use: Yes    Comment: occasionally  . Drug use: Never  . Sexual activity: Yes  Other Topics Concern  . Not on file  Social History Narrative   Lives with daughter, 1 dog   Occupation: retired, has worked in Chief Strategy Officer, also Forensic psychologist   Edu: 1.5 yrs college   Activity: square dancing   Diet: good water, fruits/vegetables daily   Patient drinks two cups of caffeine daily.   Remarried.     Social Determinants of Health   Financial Resource Strain: Low Risk   . Difficulty of Paying Living Expenses: Not hard at all  Food Insecurity: No Food Insecurity  . Worried About Charity fundraiser in the Last Year: Never true  . Ran Out of Food in the Last Year: Never true  Transportation Needs: No Transportation Needs  . Lack of Transportation (Medical): No  . Lack of Transportation (Non-Medical): No  Physical Activity: Inactive  . Days of Exercise per Week: 0 days  . Minutes of Exercise per Session: 0 min  Stress: No Stress Concern Present  . Feeling of Stress : Not at all  Social Connections:   . Frequency of Communication with Friends and Family: Not on file  . Frequency of Social Gatherings with Friends and Family: Not on file  . Attends Religious Services: Not on file  . Active Member of Clubs or Organizations: Not on file  . Attends Archivist Meetings: Not on file  . Marital Status: Not on file  Intimate Partner Violence: Not At Risk  . Fear of Current or Ex-Partner: No  . Emotionally Abused: No  . Physically Abused: No  . Sexually Abused: No      PHYSICAL EXAM  Vitals:   11/08/19 1316  BP: 110/68  Weight: 208 lb (94.3 kg)  Height: 5\' 5"  (1.651 m)   Body  mass index is 34.61 kg/m.  Generalized: Well developed, in no acute distress  Chest: Lungs clear to auscultation bilaterally  Neurological examination  Mentation: Alert oriented to time, place, history taking. Follows all commands speech and language fluent Cranial nerve II-XII: Extraocular movements were  full, visual field were full on confrontational test Head turning and shoulder shrug  were normal and symmetric. Motor: The motor testing reveals 5 over 5 strength of all 4 extremities. Good symmetric motor tone is noted throughout.  Sensory: Sensory testing is intact to soft touch on all 4 extremities. No evidence of extinction is noted.  Gait and station: Gait is normal.    DIAGNOSTIC DATA (LABS, IMAGING, TESTING) - I reviewed patient records, labs, notes, testing and imaging myself where available.  Lab Results  Component Value Date   WBC 22.8 Repeated and verified X2. (HH) 10/24/2019   HGB 14.6 10/24/2019   HCT 43.8 10/24/2019   MCV 92.8 10/24/2019   PLT 179.0 10/24/2019      Component Value Date/Time   NA 141 10/24/2019 0824   NA 142 03/17/2018 1041   K 4.2 10/24/2019 0824   CL 106 10/24/2019 0824   CO2 29 10/24/2019 0824   GLUCOSE 99 10/24/2019 0824   BUN 11 10/24/2019 0824   BUN 16 03/17/2018 1041   CREATININE 0.82 10/24/2019 0824   CALCIUM 9.0 10/24/2019 0824   PROT 6.5 10/24/2019 0824   PROT 6.5 03/17/2018 1041   ALBUMIN 4.3 10/24/2019 0824   ALBUMIN 4.6 03/17/2018 1041   AST 16 10/24/2019 0824   AST 18 04/10/2010 0000   ALT 17 10/24/2019 0824   ALKPHOS 64 10/24/2019 0824   ALKPHOS 79 04/10/2010 0000   BILITOT 0.6 10/24/2019 0824   BILITOT 0.5 03/17/2018 1041   GFRNONAA >60 07/07/2019 1037   GFRAA >60 07/07/2019 1037   Lab Results  Component Value Date   CHOL 145 10/24/2019   HDL 45.20 10/24/2019   LDLCALC 153 (H) 07/15/2018   LDLDIRECT 71.0 10/24/2019   TRIG 210.0 (H) 10/24/2019   CHOLHDL 3 10/24/2019   Lab Results  Component Value Date    HGBA1C 5.4 03/17/2018   Lab Results  Component Value Date   VITAMINB12 248 10/24/2019   Lab Results  Component Value Date   TSH 2.84 03/29/2019      ASSESSMENT AND PLAN 74 y.o. year old female  has a past medical history of B12 deficiency, Back pain, CLL (chronic lymphocytic leukemia) ([place]) (06/21/2016), HLD (hyperlipidemia), Joint pain, Knee problem, OSA on CPAP (2006, 04/2014), Osteopenia (08/2010), Piriformis syndrome (12/14/2014), and Vitamin D deficiency. here with:  1. OSA on CPAP  - CPAP compliance excellent - Good treatment of AHI  - Encourage patient to use CPAP nightly and > 4 hours each night - F/U in 1 year or sooner if needed   I spent 25 minutes of face-to-face and non-face-to-face time with patient.  This included previsit chart review, lab review, study review, order entry, electronic health record documentation, patient education.  Ward Givens, MSN, NP-C 11/08/2019, 2:06 PM [facility] [address], [place], [place] [address] [phone]

## 2019-11-14 DIAGNOSIS — G4733 Obstructive sleep apnea (adult) (pediatric): Secondary | ICD-10-CM | POA: Diagnosis not present

## 2019-11-18 ENCOUNTER — Other Ambulatory Visit: Payer: Self-pay | Admitting: Family Medicine

## 2019-11-30 DIAGNOSIS — H353221 Exudative age-related macular degeneration, left eye, with active choroidal neovascularization: Secondary | ICD-10-CM | POA: Diagnosis not present

## 2019-11-30 DIAGNOSIS — D3131 Benign neoplasm of right choroid: Secondary | ICD-10-CM | POA: Diagnosis not present

## 2019-11-30 DIAGNOSIS — D3132 Benign neoplasm of left choroid: Secondary | ICD-10-CM | POA: Diagnosis not present

## 2019-11-30 DIAGNOSIS — H353112 Nonexudative age-related macular degeneration, right eye, intermediate dry stage: Secondary | ICD-10-CM | POA: Diagnosis not present

## 2019-11-30 DIAGNOSIS — H25813 Combined forms of age-related cataract, bilateral: Secondary | ICD-10-CM | POA: Diagnosis not present

## 2020-01-09 ENCOUNTER — Inpatient Hospital Stay (HOSPITAL_BASED_OUTPATIENT_CLINIC_OR_DEPARTMENT_OTHER): Payer: Medicare HMO | Admitting: Oncology

## 2020-01-09 ENCOUNTER — Encounter: Payer: Self-pay | Admitting: Oncology

## 2020-01-09 ENCOUNTER — Inpatient Hospital Stay: Payer: Medicare HMO | Attending: Oncology

## 2020-01-09 VITALS — BP 118/59 | HR 69 | Wt 209.6 lb

## 2020-01-09 DIAGNOSIS — Z79899 Other long term (current) drug therapy: Secondary | ICD-10-CM | POA: Insufficient documentation

## 2020-01-09 DIAGNOSIS — Z87891 Personal history of nicotine dependence: Secondary | ICD-10-CM | POA: Insufficient documentation

## 2020-01-09 DIAGNOSIS — M8580 Other specified disorders of bone density and structure, unspecified site: Secondary | ICD-10-CM | POA: Diagnosis not present

## 2020-01-09 DIAGNOSIS — C9110 Chronic lymphocytic leukemia of B-cell type not having achieved remission: Secondary | ICD-10-CM

## 2020-01-09 LAB — COMPREHENSIVE METABOLIC PANEL
ALT: 21 U/L (ref 0–44)
AST: 22 U/L (ref 15–41)
Albumin: 4.2 g/dL (ref 3.5–5.0)
Alkaline Phosphatase: 71 U/L (ref 38–126)
Anion gap: 12 (ref 5–15)
BUN: 12 mg/dL (ref 8–23)
CO2: 24 mmol/L (ref 22–32)
Calcium: 9.1 mg/dL (ref 8.9–10.3)
Chloride: 104 mmol/L (ref 98–111)
Creatinine, Ser: 0.7 mg/dL (ref 0.44–1.00)
GFR, Estimated: >60 mL/min (ref >60)
Glucose, Bld: 108 mg/dL — ABNORMAL HIGH (ref 70–99)
Potassium: 3.8 mmol/L (ref 3.5–5.1)
Sodium: 140 mmol/L (ref 135–145)
Total Bilirubin: 0.7 mg/dL (ref 0.3–1.2)
Total Protein: 6.7 g/dL (ref 6.5–8.1)

## 2020-01-09 LAB — CBC WITH DIFFERENTIAL/PLATELET
Abs Immature Granulocytes: 0.05 10*3/uL (ref 0.00–0.07)
Basophils Absolute: 0.1 10*3/uL (ref 0.0–0.1)
Basophils Relative: 1 %
Eosinophils Absolute: 0.4 10*3/uL (ref 0.0–0.5)
Eosinophils Relative: 2 %
HCT: 44.7 % (ref 36.0–46.0)
Hemoglobin: 14.8 g/dL (ref 12.0–15.0)
Immature Granulocytes: 0 %
Lymphocytes Relative: 61 %
Lymphs Abs: 12.3 10*3/uL — ABNORMAL HIGH (ref 0.7–4.0)
MCH: 30.9 pg (ref 26.0–34.0)
MCHC: 33.1 g/dL (ref 30.0–36.0)
MCV: 93.3 fL (ref 80.0–100.0)
Monocytes Absolute: 3 10*3/uL — ABNORMAL HIGH (ref 0.1–1.0)
Monocytes Relative: 15 %
Neutro Abs: 4.2 10*3/uL (ref 1.7–7.7)
Neutrophils Relative %: 21 %
Platelets: 174 10*3/uL (ref 150–400)
RBC: 4.79 MIL/uL (ref 3.87–5.11)
RDW: 13.9 % (ref 11.5–15.5)
Smear Review: NORMAL
WBC: 20.1 10*3/uL — ABNORMAL HIGH (ref 4.0–10.5)
nRBC: 0 % (ref 0.0–0.2)

## 2020-01-09 NOTE — Progress Notes (Signed)
Hematology/Oncology Consult note [facility]  Telephone:([phone] [phone]  Patient Care Team: Ria Bush, MD as PCP - General (Family Medicine) Solon Augusta, MD as Attending Physician (Ophthalmology) Elbert Ewings, DDS as Consulting Physician (Dentistry)   Name of the patient: Lori Shaw  [id number]  Jul 09, 1945   Date of visit: 01/09/20  Diagnosis-Rai stage 0 CLL  Chief complaint/ Reason for visit-routine follow-up of CLL  Heme/Onc history:  patient is a 74 year old female with no significant past medical history other than low B12 and low vitamin D levels. Patient was recently noted to have leukocytosis with a white count of 13. Differential mainly showed lymphocytosis. Recent CBC from 07/03/2016 showed white count of 13, H&H of 14.7/44 and a platelet count of 207. Differential showed 63.5% lymphocytes. Peripheralflow cytometry showed CD5 positive, CD23 positive clonal B-cell population CLL/SLL phenotype.    Interval history-patient reports doing well overall.  No recent infections or hospitalizations.  Appetite and weight have remained stable.  She has not received Covid vaccination.  ECOG PS- 1 Pain scale- 0   Review of systems- Review of Systems  Constitutional: Positive for malaise/fatigue. Negative for chills, fever and weight loss.  HENT: Negative for congestion, ear discharge and nosebleeds.   Eyes: Negative for blurred vision.  Respiratory: Negative for cough, hemoptysis, sputum production, shortness of breath and wheezing.   Cardiovascular: Negative for chest pain, palpitations, orthopnea and claudication.  Gastrointestinal: Negative for abdominal pain, blood in stool, constipation, diarrhea, heartburn, melena, nausea and vomiting.  Genitourinary: Negative for dysuria, flank pain, frequency, hematuria and urgency.  Musculoskeletal: Negative for back pain, joint pain and myalgias.  Skin: Negative for rash.   Neurological: Negative for dizziness, tingling, focal weakness, seizures, weakness and headaches.  Endo/Heme/Allergies: Does not bruise/bleed easily.  Psychiatric/Behavioral: Negative for depression and suicidal ideas. The patient does not have insomnia.       Allergies  Allergen Reactions   Sulfa Antibiotics Nausea Only and Nausea And Vomiting     Past Medical History:  Diagnosis Date   B12 deficiency    Back pain    CLL (chronic lymphocytic leukemia) ([place]) 06/21/2016   Rai stage 0, dx 06/2016 (Dr Janese Banks)   HLD (hyperlipidemia)    Joint pain    Knee problem    OSA on CPAP 2006, 04/2014   AHI of 23.8 and an RDI of 42.9, on CPAP 9 cm H2O (Dohmeier)   Osteopenia 08/2010   T -1.4 at L femur neck   Piriformis syndrome 12/14/2014   Vitamin D deficiency      Past Surgical History:  Procedure Laterality Date   ABDOMINAL HYSTERECTOMY  01/27/1983   for menometrorrhagia, one ovary remained   CHOLECYSTECTOMY N/A 08/01/2018   LAPAROSCOPIC CHOLECYSTECTOMY; for gallstone pancreatitis  Celine Ahr, Anderson Malta, MD)   COLONOSCOPY  2012   no records available. per patient normal.    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, fitness trainer  Tobacco Use   Smoking status: Former Smoker    Packs/day: 2.00    Years: 20.00    Pack years: 40.00    Types: Cigarettes    Quit date: 01/26/1978    Years since  quitting: 41.9   Smokeless tobacco: Never Used  Vaping Use   Vaping Use: Never used  Substance and Sexual Activity   Alcohol use: Yes    Comment: occasionally   Drug use: Never   Sexual activity: Yes  Other Topics Concern   Not on file  Social History Narrative   Lives with daughter, 1 dog    Occupation: retired, has worked in Chief Strategy Officer, also Forensic psychologist   Edu: 1.5 yrs college   Activity: square dancing   Diet: good water, fruits/vegetables daily   Patient drinks two cups of caffeine daily.   Remarried.     Social Determinants of Health   Financial Resource Strain: Low Risk    Difficulty of Paying Living Expenses: Not hard at all  Food Insecurity: No Food Insecurity   Worried About Charity fundraiser in the Last Year: Never true   [place] in the Last Year: Never true  Transportation Needs: No Transportation Needs   Lack of Transportation (Medical): No   Lack of Transportation (Non-Medical): No  Physical Activity: Inactive   Days of Exercise per Week: 0 days   Minutes of Exercise per Session: 0 min  Stress: No Stress Concern Present   Feeling of Stress : Not at all  Social Connections: Not on file  Intimate Partner Violence: Not At Risk   Fear of Current or Ex-Partner: No   Emotionally Abused: No   Physically Abused: No   Sexually Abused: No    Family History  Problem Relation Age of Onset   Hyperlipidemia Mother    CAD Mother        CABG   Hypertension Mother    Hyperlipidemia Father    CAD Father        MI   Sudden death Father    Arthritis Maternal Grandmother    Cancer Sister        breast   Breast cancer Sister 39   Alzheimer's disease Other    Heart attack Other    Diabetes Neg Hx    Stroke Neg Hx      Current Outpatient Medications:    Cholecalciferol (VITAMIN D) 125 MCG (5000 UT) CAPS, Take 1 capsule by mouth daily., Disp: 30 capsule, Rfl:    Cyanocobalamin (VITAMIN B-12) 2500 MCG SUBL, Place 1 tablet (2,500 mcg total) under the tongue daily., Disp: , Rfl: 0   fluticasone (FLONASE) 50 MCG/ACT nasal spray, Place 2 sprays into both nostrils daily as needed., Disp: 16 g, Rfl: 3   Multiple Vitamins-Minerals (PRESERVISION AREDS) TABS, Take 2 tablets by mouth daily., Disp: , Rfl:    rosuvastatin  (CRESTOR) 10 MG tablet, Take 1 tablet (10 mg total) by mouth daily., Disp: 90 tablet, Rfl: 3   B Complex Vitamins (B COMPLEX 1 PO), Take 1 tablet by mouth daily. (Patient not taking: Reported on 01/09/2020), Disp: , Rfl:   Physical exam:  Vitals:   01/09/20 1050  BP: (!) 118/59  Pulse: 69  SpO2: 97%  Weight: 209 lb 9.6 oz (95.1 kg)   Physical Exam Eyes:     Extraocular Movements: EOM normal.  Cardiovascular:     Rate and Rhythm: Normal rate and regular rhythm.     Heart sounds: Normal heart sounds.  Pulmonary:     Effort: Pulmonary effort is normal.     Breath sounds: Normal breath sounds.  Abdominal:     General: Bowel sounds are normal.     Palpations: Abdomen is  soft.     Comments: No palpable hepatosplenomegaly  Lymphadenopathy:     Comments: No palpable cervical, supraclavicular, axillary or inguinal adenopathy   Skin:    General: Skin is warm and dry.  Neurological:     Mental Status: She is alert and oriented to person, place, and time.      CMP Latest Ref Rng & Units 01/09/2020  Glucose 70 - 99 mg/dL 108(H)  BUN 8 - 23 mg/dL 12  Creatinine 0.44 - 1.00 mg/dL 0.70  Sodium 135 - 145 mmol/L 140  Potassium 3.5 - 5.1 mmol/L 3.8  Chloride 98 - 111 mmol/L 104  CO2 22 - 32 mmol/L 24  Calcium 8.9 - 10.3 mg/dL 9.1  Total Protein 6.5 - 8.1 g/dL 6.7  Total Bilirubin 0.3 - 1.2 mg/dL 0.7  Alkaline Phos 38 - 126 U/L 71  AST 15 - 41 U/L 22  ALT 0 - 44 U/L 21   CBC Latest Ref Rng & Units 01/09/2020  WBC 4.0 - 10.5 K/uL 20.1(H)  Hemoglobin 12.0 - 15.0 g/dL 14.8  Hematocrit 36.0 - 46.0 % 44.7  Platelets 150 - 400 K/uL 174     Assessment and plan- Patient is a 74 y.o. female with Rai stage 0 CLL here for routine follow-up  Clinically patient is doing well with no concerning B symptoms.  She has no palpable adenopathy.  White cell count is stable between 20-25 with a stable elevated lymphocyte count from her CLL.  No other cytopenias such as anemia or thrombocytopenia.   She does not require any treatment for CLL at this time.  Repeat CBC with differential in 6 months in 1 year and I will see her back in 1 year   Visit Diagnosis 1. CLL (chronic lymphocytic leukemia) ([place])      Dr. Randa Evens, MD, MPH [facility] at [facility] [phone] 01/09/2020 1:27 PM

## 2020-01-10 NOTE — Addendum Note (Signed)
Addended by: Kern Alberta on: 01/10/2020 09:12 AM   Modules accepted: Orders

## 2020-01-11 DIAGNOSIS — R69 Illness, unspecified: Secondary | ICD-10-CM | POA: Diagnosis not present

## 2020-01-15 DIAGNOSIS — G4733 Obstructive sleep apnea (adult) (pediatric): Secondary | ICD-10-CM | POA: Diagnosis not present

## 2020-01-16 DIAGNOSIS — R69 Illness, unspecified: Secondary | ICD-10-CM | POA: Diagnosis not present

## 2020-02-02 ENCOUNTER — Emergency Department
Admission: EM | Admit: 2020-02-02 | Discharge: 2020-02-03 | Disposition: A | Discharge status: Home / Self Care | Payer: Medicare HMO | Attending: Emergency Medicine | Admitting: Emergency Medicine

## 2020-02-02 ENCOUNTER — Encounter: Payer: Self-pay | Admitting: Emergency Medicine

## 2020-02-02 ENCOUNTER — Other Ambulatory Visit: Payer: Self-pay

## 2020-02-02 ENCOUNTER — Ambulatory Visit
Admission: EM | Admit: 2020-02-02 | Discharge: 2020-02-02 | Disposition: A | Discharge status: Home / Self Care | Payer: Medicare HMO | Attending: Emergency Medicine | Admitting: Emergency Medicine

## 2020-02-02 DIAGNOSIS — K760 Fatty (change of) liver, not elsewhere classified: Secondary | ICD-10-CM | POA: Diagnosis not present

## 2020-02-02 DIAGNOSIS — Z20822 Contact with and (suspected) exposure to covid-19: Secondary | ICD-10-CM | POA: Insufficient documentation

## 2020-02-02 DIAGNOSIS — I700 Atherosclerosis of aorta: Secondary | ICD-10-CM | POA: Diagnosis not present

## 2020-02-02 DIAGNOSIS — J189 Pneumonia, unspecified organism: Secondary | ICD-10-CM | POA: Diagnosis not present

## 2020-02-02 DIAGNOSIS — R109 Unspecified abdominal pain: Secondary | ICD-10-CM | POA: Diagnosis not present

## 2020-02-02 DIAGNOSIS — K5730 Diverticulosis of large intestine without perforation or abscess without bleeding: Secondary | ICD-10-CM | POA: Insufficient documentation

## 2020-02-02 DIAGNOSIS — Z87891 Personal history of nicotine dependence: Secondary | ICD-10-CM | POA: Diagnosis not present

## 2020-02-02 DIAGNOSIS — K449 Diaphragmatic hernia without obstruction or gangrene: Secondary | ICD-10-CM | POA: Diagnosis not present

## 2020-02-02 DIAGNOSIS — R0902 Hypoxemia: Secondary | ICD-10-CM | POA: Diagnosis not present

## 2020-02-02 DIAGNOSIS — K429 Umbilical hernia without obstruction or gangrene: Secondary | ICD-10-CM | POA: Diagnosis not present

## 2020-02-02 DIAGNOSIS — R001 Bradycardia, unspecified: Secondary | ICD-10-CM | POA: Diagnosis not present

## 2020-02-02 LAB — COMPREHENSIVE METABOLIC PANEL
ALT: 20 IU/L (ref 0–32)
AST: 16 IU/L (ref 0–40)
Albumin/Globulin Ratio: 2.9 — ABNORMAL HIGH (ref 1.2–2.2)
Albumin: 4.6 g/dL (ref 3.7–4.7)
Alkaline Phosphatase: 105 IU/L (ref 44–121)
BUN/Creatinine Ratio: 15 (ref 12–28)
BUN: 11 mg/dL (ref 8–27)
Bilirubin Total: 0.3 mg/dL (ref 0.0–1.2)
CO2: 26 mmol/L (ref 20–29)
Calcium: 9.9 mg/dL (ref 8.7–10.3)
Chloride: 106 mmol/L (ref 96–106)
Creatinine, Ser: 0.71 mg/dL (ref 0.57–1.00)
GFR calc Af Amer: 98 mL/min/{1.73_m2} (ref >59)
GFR calc non Af Amer: 85 mL/min/{1.73_m2} (ref >59)
Globulin, Total: 1.6 g/dL (ref 1.5–4.5)
Glucose: 101 mg/dL — ABNORMAL HIGH (ref 65–99)
Potassium: 4.3 mmol/L (ref 3.5–5.2)
Sodium: 142 mmol/L (ref 134–144)
Total Protein: 6.2 g/dL (ref 6.0–8.5)

## 2020-02-02 LAB — CBC
Hematocrit: 43.3 % (ref 34.0–46.6)
Hemoglobin: 14.4 g/dL (ref 11.1–15.9)
MCH: 30.4 pg (ref 26.6–33.0)
MCHC: 33.3 g/dL (ref 31.5–35.7)
MCV: 91 fL (ref 79–97)
Platelets: 168 10*3/uL (ref 150–450)
RBC: 4.74 x10E6/uL (ref 3.77–5.28)
RDW: 14 % (ref 11.7–15.4)
WBC: 26.2 10*3/uL (ref 3.4–10.8)

## 2020-02-02 LAB — POCT URINALYSIS DIP (MANUAL ENTRY)
Bilirubin, UA: NEGATIVE
Blood, UA: NEGATIVE
Glucose, UA: NEGATIVE mg/dL
Ketones, POC UA: NEGATIVE mg/dL
Nitrite, UA: NEGATIVE
Protein Ur, POC: NEGATIVE mg/dL
Spec Grav, UA: 1.02 (ref 1.010–1.025)
Urobilinogen, UA: 0.2 E.U./dL
pH, UA: 6 (ref 5.0–8.0)

## 2020-02-02 LAB — LIPASE: Lipase: 114 U/L — ABNORMAL HIGH (ref 14–85)

## 2020-02-02 MED ORDER — KETOROLAC TROMETHAMINE 30 MG/ML IJ SOLN
30.0000 mg | Freq: Once | INTRAMUSCULAR | Status: AC
Start: 2020-02-02 — End: 2020-02-02
  Administered 2020-02-02: 30 mg via INTRAMUSCULAR

## 2020-02-02 MED ORDER — HYDROMORPHONE HCL 1 MG/ML IJ SOLN
0.5000 mg | Freq: Once | INTRAMUSCULAR | Status: AC
  Administered 2020-02-02: 0.5 mg via INTRAVENOUS
  Filled 2020-02-02: qty 1

## 2020-02-02 MED ORDER — TIZANIDINE HCL 4 MG PO TABS: 4.0000 mg | ORAL_TABLET | Freq: Four times a day (QID) | ORAL | 0 refills | Status: DC | PRN

## 2020-02-02 MED ORDER — NAPROXEN 500 MG PO TABS: 500.0000 mg | ORAL_TABLET | Freq: Two times a day (BID) | ORAL | 0 refills | Status: DC

## 2020-02-02 NOTE — Discharge Instructions (Addendum)
We gave you a shot of Toradol, continue with Naprosyn twice daily with food Supplement tizanidine as needed-may cause drowsiness, limit use to at home/bedtime Alternate ice and heat Blood work pending, I will call if abnormal Follow-up if not improving or worsening, symptoms changing

## 2020-02-02 NOTE — ED Provider Notes (Signed)
[facility]    CSN: [id number] Arrival date & time: 02/02/20  1127      History   Chief Complaint Chief Complaint  Patient presents with  . Flank Pain    HPI Lori Shaw is a 75 y.o. female presenting today for evaluation of right flank pain.  Reports over the past 2 days she has had pain on her right side.  Reports started after changing a flagpole and doing some heavy lifting.  Initially thought muscle strain, but concerned as pain is persisted.  Reports pain with lying down as well as with certain movements.  She reports history of prior pancreatic issues and is concerned about this being a cause as well as she reports sister recently diagnosed with pancreatic cancer. Eating and drinking normally, denies any nausea or vomiting.  Bowel movements regular.  Denies any urinary symptoms of dysuria, increased frequency or urgency.  HPI  Past Medical History:  Diagnosis Date  . B12 deficiency   . Back pain   . CLL (chronic lymphocytic leukemia) ([place]) 06/21/2016   Rai stage 0, dx 06/2016 (Dr Janese Banks)  . HLD (hyperlipidemia)   . Joint pain   . Knee problem   . OSA on CPAP 2006, 04/2014   AHI of 23.8 and an RDI of 42.9, on CPAP 9 cm H2O (Dohmeier)  . Osteopenia 08/2010   T -1.4 at L femur neck  . Piriformis syndrome 12/14/2014  . Vitamin D deficiency     Patient Active Problem List   Diagnosis Date Noted  . Choroidal nevus of both eyes 11/01/2018  . Combined forms of age-related cataract of both eyes 11/01/2018  . Exudative age-related macular degeneration of left eye with active choroidal neovascularization ([place]) 11/01/2018  . S/P laparoscopic cholecystectomy 08/17/2018  . NAFLD (nonalcoholic fatty liver disease) 07/29/2018  . Acute pancreatitis 07/28/2018  . Insulin resistance 03/21/2018  . Vitamin D deficiency 06/30/2016  . CLL (chronic lymphocytic leukemia) ([place]) 06/21/2016  . Vitamin B12 deficiency 06/21/2016  . Other fatigue 06/18/2016  . ETD  (eustachian tube dysfunction) 04/27/2016  . Rash and nonspecific skin eruption 08/23/2015  . Encounter for general adult medical examination with abnormal findings 06/20/2015  . Advanced care planning/counseling discussion 06/20/2015  . Piriformis syndrome of left side 12/14/2014  . Hypersomnia, persistent 09/18/2014  . Obesity, Class I, BMI 30-34.9 09/18/2014  . Sleep related headaches 04/24/2014  . Medicare annual wellness visit, subsequent 05/04/2013  . Seasonal allergic rhinitis 01/17/2013  . HLD (hyperlipidemia)   . OSA on CPAP   . Osteopenia 08/27/2010    Past Surgical History:  Procedure Laterality Date  . ABDOMINAL HYSTERECTOMY  01/27/1983   for menometrorrhagia, one ovary remained  . CHOLECYSTECTOMY N/A 08/01/2018   LAPAROSCOPIC CHOLECYSTECTOMY; for gallstone pancreatitis  Celine Ahr, Anderson Malta, MD)  . COLONOSCOPY  2012   no records available. per patient normal.   . dexa  08/2010   osteopenia  . TONSILLECTOMY  01/26/1973    OB History    Gravida  2   Para  2   Term      Preterm      AB      Living        SAB      IAB      Ectopic      Multiple      Live Births               Home Medications    Prior to Admission medications  Medication Sig Start Date End Date Taking? Authorizing Provider  Cholecalciferol (VITAMIN D) 125 MCG (5000 UT) CAPS Take 1 capsule by mouth daily. 10/31/19  Yes Ria Bush, MD  Cyanocobalamin (VITAMIN B-12) 2500 MCG SUBL Place 1 tablet (2,500 mcg total) under the tongue daily. 02/16/17  Yes Ria Bush, MD  Multiple Vitamins-Minerals (PRESERVISION AREDS) TABS Take 2 tablets by mouth daily.   Yes [provider]  naproxen (NAPROSYN) 500 MG tablet Take 1 tablet (500 mg total) by mouth 2 (two) times daily. 02/02/20  Yes Diarra Ceja C, PA-C  rosuvastatin (CRESTOR) 10 MG tablet Take 1 tablet (10 mg total) by mouth daily. 10/31/19  Yes Ria Bush, MD  tiZANidine (ZANAFLEX) 4 MG tablet Take 1 tablet (4 mg  total) by mouth every 6 (six) hours as needed for muscle spasms. 02/02/20  Yes Glendel Jaggers C, PA-C  B Complex Vitamins (B COMPLEX 1 PO) Take 1 tablet by mouth daily. Patient not taking: No sig reported    [provider]  fluticasone (FLONASE) 50 MCG/ACT nasal spray Place 2 sprays into both nostrils daily as needed. 10/31/19   Ria Bush, MD    Family History Family History  Problem Relation Age of Onset  . Hyperlipidemia Mother   . CAD Mother        CABG  . Hypertension Mother   . Hyperlipidemia Father   . CAD Father        MI  . Sudden death Father   . Arthritis Maternal Grandmother   . Cancer Sister        breast  . Breast cancer Sister 76  . Alzheimer's disease Other   . Heart attack Other   . Diabetes Neg Hx   . Stroke Neg Hx     Social History Social History   Tobacco Use  . Smoking status: Former Smoker    Packs/day: 2.00    Years: 20.00    Pack years: 40.00    Types: Cigarettes    Quit date: 01/26/1978    Years since quitting: 42.0  . Smokeless tobacco: Never Used  Vaping Use  . Vaping Use: Never used  Substance Use Topics  . Alcohol use: Yes    Comment: occasionally  . Drug use: Never     Allergies   Sulfa antibiotics   Review of Systems Review of Systems  Constitutional: Negative for fatigue and fever.  HENT: Negative for mouth sores.   Eyes: Negative for visual disturbance.  Respiratory: Negative for shortness of breath.   Cardiovascular: Negative for chest pain.  Gastrointestinal: Negative for abdominal pain, nausea and vomiting.  Genitourinary: Positive for flank pain. Negative for genital sores.  Musculoskeletal: Negative for arthralgias and joint swelling.  Skin: Negative for color change, rash and wound.  Neurological: Negative for dizziness, weakness, light-headedness and headaches.     Physical Exam Triage Vital Signs ED Triage Vitals  Enc Vitals Group     BP 02/02/20 1343 130/82     Pulse Rate 02/02/20 1343 71      Resp 02/02/20 1343 16     Temp 02/02/20 1343 97.7 F (36.5 C)     Temp Source 02/02/20 1343 Oral     SpO2 02/02/20 1343 98 %     Weight 02/02/20 1151 206 lb (93.4 kg)     Height 02/02/20 1151 5' 5.5" (1.664 m)     Head Circumference --      Peak Flow --      Pain Score 02/02/20 1150  5     Pain Loc --      Pain Edu? --      Excl. in GC? --    No data found.  Updated Vital Signs BP 130/82 (BP Location: Left Arm)   Pulse 71   Temp 97.7 F (36.5 C) (Oral)   Resp 16   Ht 5\' 2"  (1.575 m)   Wt 230 lb (104.3 kg)   SpO2 98%   BMI 42.07 kg/m   Visual Acuity Right Eye Distance:   Left Eye Distance:   Bilateral Distance:    Right Eye Near:   Left Eye Near:    Bilateral Near:     Physical Exam Vitals and nursing note reviewed.  Constitutional:      Appearance: She is well-developed and well-nourished.     Comments: No acute distress, standing for comfort  HENT:     Head: Normocephalic and atraumatic.     Nose: Nose normal.  Eyes:     Conjunctiva/sclera: Conjunctivae normal.  Cardiovascular:     Rate and Rhythm: Normal rate.  Pulmonary:     Effort: Pulmonary effort is normal. No respiratory distress.     Comments: Breathing comfortably at rest, CTABL, no wheezing, rales or other adventitious sounds auscultated Abdominal:     General: There is no distension.     Comments: Soft, mildly distended, nontender to palpation throughout entire abdomen  Musculoskeletal:        General: Normal range of motion.     Cervical back: Neck supple.     Comments: Tender to palpation along right flank, does not extend into right lower thoracic or lumbar areas  Skin:    General: Skin is warm and dry.  Neurological:     Mental Status: She is alert and oriented to person, place, and time.  Psychiatric:        Mood and Affect: Mood and affect normal.      UC Treatments / Results  Labs (all labs ordered are listed, but only abnormal results are displayed) Labs Reviewed  POCT  URINALYSIS DIP (MANUAL ENTRY) - Abnormal; Notable for the following components:      Result Value   Leukocytes, UA Trace (*)    All other components within normal limits  CBC  COMPREHENSIVE METABOLIC PANEL  LIPASE    EKG   Radiology No results found.  Procedures Procedures (including critical care time)  Medications Ordered in UC Medications  ketorolac (TORADOL) 30 MG/ML injection 30 mg (30 mg Intramuscular Given 02/02/20 1429)    Initial Impression / Assessment and Plan / UC Course  I have reviewed the triage vital signs and the nursing notes.  Pertinent labs & imaging results that were available during my care of the patient were reviewed by me and considered in my medical decision making (see chart for details).     UA not consistent with UTI, negative hemoglobin, do not suspect underlying stone.  Suspect most likely muscular etiology, seems less likely GI related given normal oral intake without affecting pain.  Will obtain basic labs given patient's concern regarding her pancreas and will call if abnormal/changing plan.  In the meantime providing Toradol and recommending to continue with anti-inflammatories and muscle relaxers.  Ice and heat. Discussed strict return precautions. Patient verbalized understanding and is agreeable with plan.   Final Clinical Impressions(s) / UC Diagnoses   Final diagnoses:  Right flank pain     Discharge Instructions     We gave you a shot  of Toradol, continue with Naprosyn twice daily with food Supplement tizanidine as needed-may cause drowsiness, limit use to at home/bedtime Alternate ice and heat Blood work pending, I will call if abnormal Follow-up if not improving or worsening, symptoms changing   ED Prescriptions    Medication Sig Dispense Auth. Provider   naproxen (NAPROSYN) 500 MG tablet Take 1 tablet (500 mg total) by mouth 2 (two) times daily. 30 tablet Kimberley Speece C, PA-C   tiZANidine (ZANAFLEX) 4 MG tablet Take 1  tablet (4 mg total) by mouth every 6 (six) hours as needed for muscle spasms. 30 tablet Seletha Zimmermann, [place] C, PA-C     PDMP not reviewed this encounter.   Janith Lima, Lori 02/02/20 1555

## 2020-02-02 NOTE — ED Triage Notes (Signed)
Pt to ED via POV, pt states that she went to [facility] earlier today for pain in her right side, pt thought she may have pulled a muscle. Pt states that they took blood work and told her that she needed to come to ED to be evaluated because 2 of her blood levels were elevated. Pts WBC 26.2 with Hx/o CLL. Pt is in NAD.

## 2020-02-02 NOTE — ED Provider Notes (Signed)
Lori Shaw  ____________________________________________   Event Date/Time   First MD Initiated Contact with Patient 02/02/20 2307     (approximate)  I have reviewed the triage vital signs and the nursing notes.   HISTORY  Chief Complaint Abdominal Pain   HPI Lori Shaw is a 75 y.o. female with past medical history of OSA, HDL, and CLL currently under surveillance not undergoing treatment who presents for assessment approximately 3 days of right-sided flank pain. Patient states she thought it was from doing some heavy lifting as she switched to flag unremarkable in her yard the day it began and lifted some heavy bags in a store. She states that she has had persistent pain in the right side since then but no other acute symptoms including fevers, chills, cough, shortness of breath, chest pain, abdominal pain, urinary symptoms, diarrhea, constipation, rash, extremity pain, or any other acute sick symptoms. No prior similar episodes. No clear alleviating aggravating factors. She has been taking Tylenol but this is not helped significantly. She notes she was seen in urgent care earlier today but was referred to the ED after white blood cell count was noted to be elevated.         Past Medical History:  Diagnosis Date  . B12 deficiency   . Back pain   . CLL (chronic lymphocytic leukemia) ([place]) 06/21/2016   Rai stage 0, dx 06/2016 (Dr Janese Banks)  . HLD (hyperlipidemia)   . Joint pain   . Knee problem   . OSA on CPAP 2006, 04/2014   AHI of 23.8 and an RDI of 42.9, on CPAP 9 cm H2O ([place])  . Osteopenia 08/2010   T -1.4 at L femur neck  . Piriformis syndrome 12/14/2014  . Vitamin D deficiency     Patient Active Problem List   Diagnosis Date Noted  . Choroidal nevus of both eyes 11/01/2018  . Combined forms of age-related cataract of both eyes 11/01/2018  . Exudative age-related macular degeneration of left eye  with active choroidal neovascularization ([place]) 11/01/2018  . S/P laparoscopic cholecystectomy 08/17/2018  . NAFLD (nonalcoholic fatty liver disease) 07/29/2018  . Acute pancreatitis 07/28/2018  . Insulin resistance 03/21/2018  . Vitamin D deficiency 06/30/2016  . CLL (chronic lymphocytic leukemia) ([place]) 06/21/2016  . Vitamin B12 deficiency 06/21/2016  . Other fatigue 06/18/2016  . ETD (eustachian tube dysfunction) 04/27/2016  . Rash and nonspecific skin eruption 08/23/2015  . Encounter for general adult medical examination with abnormal findings 06/20/2015  . Advanced care planning/counseling discussion 06/20/2015  . Piriformis syndrome of left side 12/14/2014  . Hypersomnia, persistent 09/18/2014  . Obesity, Class I, BMI 30-34.9 09/18/2014  . Sleep related headaches 04/24/2014  . Medicare annual wellness visit, subsequent 05/04/2013  . Seasonal allergic rhinitis 01/17/2013  . HLD (hyperlipidemia)   . OSA on CPAP   . Osteopenia 08/27/2010    Past Surgical History:  Procedure Laterality Date  . ABDOMINAL HYSTERECTOMY  01/27/1983   for menometrorrhagia, one ovary remained  . CHOLECYSTECTOMY N/A 08/01/2018   LAPAROSCOPIC CHOLECYSTECTOMY; for gallstone pancreatitis  Celine Ahr, Anderson Malta, MD)  . COLONOSCOPY  2012   no records available. per patient normal.   . dexa  08/2010   osteopenia  . TONSILLECTOMY  01/26/1973    Prior to Admission medications   Medication Sig Start Date End Date Taking? Authorizing Provider  doxycycline (VIBRAMYCIN) 100 MG capsule Take 1 capsule (100 mg total) by mouth 2 (two) times daily for 7  days. 02/03/20 02/10/20 Yes Lucrezia Starch, MD  B Complex Vitamins (B COMPLEX 1 PO) Take 1 tablet by mouth daily. Patient not taking: No sig reported    [provider]  Cholecalciferol (VITAMIN D) 125 MCG (5000 UT) CAPS Take 1 capsule by mouth daily. 10/31/19   Ria Bush, MD  Cyanocobalamin (VITAMIN B-12) 2500 MCG SUBL Place 1 tablet (2,500 mcg total)  under the tongue daily. 02/16/17   Ria Bush, MD  fluticasone [facility]) 50 MCG/ACT nasal spray Place 2 sprays into both nostrils daily as needed. 10/31/19   Ria Bush, MD  Multiple Vitamins-Minerals (PRESERVISION AREDS) TABS Take 2 tablets by mouth daily.    [provider]  naproxen (NAPROSYN) 500 MG tablet Take 1 tablet (500 mg total) by mouth 2 (two) times daily. 02/02/20   Wieters, Hallie C, PA-C  rosuvastatin (CRESTOR) 10 MG tablet Take 1 tablet (10 mg total) by mouth daily. 10/31/19   Ria Bush, MD  tiZANidine (ZANAFLEX) 4 MG tablet Take 1 tablet (4 mg total) by mouth every 6 (six) hours as needed for muscle spasms. 02/02/20   Wieters, Hallie C, PA-C    Allergies Sulfa antibiotics  Family History  Problem Relation Age of Onset  . Hyperlipidemia Mother   . CAD Mother        CABG  . Hypertension Mother   . Hyperlipidemia Father   . CAD Father        MI  . Sudden death Father   . Arthritis Maternal Grandmother   . Cancer Sister        breast  . Breast cancer Sister 64  . Alzheimer's disease Other   . Heart attack Other   . Diabetes Neg Hx   . Stroke Neg Hx     Social History Social History   Tobacco Use  . Smoking status: Former Smoker    Packs/day: 2.00    Years: 20.00    Pack years: 40.00    Types: Cigarettes    Quit date: 01/26/1978    Years since quitting: 42.0  . Smokeless tobacco: Never Used  Vaping Use  . Vaping Use: Never used  Substance Use Topics  . Alcohol use: Yes    Comment: occasionally  . Drug use: Never    Review of Systems  Review of Systems  Constitutional: Negative for chills and fever.  HENT: Negative for sore throat.   Eyes: Negative for pain.  Respiratory: Negative for cough and stridor.   Cardiovascular: Negative for chest pain.  Gastrointestinal: Negative for vomiting.  Genitourinary: Positive for flank pain.  Musculoskeletal: Negative for myalgias.  Skin: Negative for rash.  Neurological: Negative for  seizures, loss of consciousness and headaches.  Psychiatric/Behavioral: Negative for suicidal ideas.  All other systems reviewed and are negative.     ____________________________________________   PHYSICAL EXAM:  VITAL SIGNS: ED Triage Vitals  Enc Vitals Group     BP 02/02/20 1834 111/70     Pulse Rate 02/02/20 1834 67     Resp 02/02/20 1834 16     Temp 02/02/20 1834 98 F (36.7 C)     Temp Source 02/02/20 1834 Oral     SpO2 02/02/20 1834 97 %     Weight --      Height --      Head Circumference --      Peak Flow --      Pain Score 02/02/20 1833 3     Pain Loc --  Pain Edu? --      Excl. in [place]? --    Vitals:   02/03/20 0000 02/03/20 0100  BP: 133/73 114/70  Pulse: 65 63  Resp:    Temp:    SpO2: 95% 94%   Physical Exam Vitals and nursing Shaw reviewed.  Constitutional:      General: She is not in acute distress.    Appearance: She is well-developed and well-nourished. She is obese.  HENT:     Head: Normocephalic and atraumatic.     Right Ear: External ear normal.     Left Ear: External ear normal.  Eyes:     Conjunctiva/sclera: Conjunctivae normal.  Cardiovascular:     Rate and Rhythm: Normal rate and regular rhythm.     Heart sounds: No murmur heard.   Pulmonary:     Effort: Pulmonary effort is normal. No respiratory distress.     Breath sounds: Normal breath sounds.  Abdominal:     Palpations: Abdomen is soft.     Tenderness: There is no abdominal tenderness. There is no right CVA tenderness or left CVA tenderness.  Musculoskeletal:        General: No edema.     Cervical back: Neck supple.     Right lower leg: No edema.     Left lower leg: No edema.  Skin:    General: Skin is warm and dry.     Capillary Refill: Capillary refill takes less than 2 seconds.  Neurological:     Mental Status: She is alert and oriented to person, place, and time.  Psychiatric:        Mood and Affect: Mood and affect and mood normal.     Mild tenderness in the  right flank. No overlying skin changes. ____________________________________________   LABS (all labs ordered are listed, but only abnormal results are displayed)  Labs Reviewed  RESP PANEL BY RT-PCR (FLU A&B, COVID) ARPGX2  LACTIC ACID, PLASMA  PROCALCITONIN   ____________________________________________  EKG  Sinus bradycardia with a ventricular rate of 56, normal axis, unremarkable intervals, no evidence of acute ischemia or other underlying arrhythmia. ____________________________________________  RADIOLOGY  ED MD interpretation: CT abdomen pelvis remarkable for some possible groundglass opacities versus atelectasis in the right lung base with no other acute intra-abdominal pelvic pathology.  Patient is noted to have steatosis and a small umbilical hernia. Chest x-ray with possible atypical pneumonia in the right lung base with atelectasis. No pneumothorax, effusion, overt edema, or other clear acute intrathoracic process. Official radiology report(s): DG Chest 2 View  Result Date: 02/03/2020 CLINICAL DATA:  Hypoxia.  Question pneumonia. EXAM: CHEST - 2 VIEW COMPARISON:  Lung bases from abdominal CT earlier today. FINDINGS: The heart is normal in size. Normal mediastinal contours. Aortic atherosclerosis. Ill-defined opacities at both lung bases. No pleural fluid or pneumothorax. No acute osseous abnormalities are seen. IMPRESSION: Ill-defined opacities at both lung bases, atelectasis versus atypical pneumonia. Electronically Signed   By: Keith Rake M.D.   On: 02/03/2020 01:54   CT ABDOMEN PELVIS W CONTRAST  Result Date: 02/03/2020 CLINICAL DATA:  Acute abdominal pain. Patient reports right flank pain. EXAM: CT ABDOMEN AND PELVIS WITH CONTRAST TECHNIQUE: Multidetector CT imaging of the abdomen and pelvis was performed using the standard protocol following bolus administration of intravenous contrast. CONTRAST:  176mL OMNIPAQUE IOHEXOL 300 MG/ML  SOLN COMPARISON:  None. FINDINGS:  Lower chest: Subpleural ground-glass opacities within both lung bases, with additional linear atelectasis. No pleural fluid. Heart is upper normal  in size. Hepatobiliary: Diffusely decreased hepatic density typical of steatosis. There is a 13 mm low-density lesion in the right dome of the liver, likely cyst. Clips in the gallbladder fossa postcholecystectomy. No biliary dilatation. Pancreas: No ductal dilatation or inflammation. Spleen: Calcified granuloma. Normal in size. Greatest splenic dimension 10.3 cm. Adrenals/Urinary Tract: Normal adrenal glands. Homogeneous renal enhancement. There is no hydronephrosis. Absent renal excretion on delayed phase imaging. No focal renal abnormality. No renal stone. Urinary bladder is partially distended. No bladder wall thickening. Stomach/Bowel: Small hiatal hernia. Stomach is decompressed. There is a periampullary duodenal diverticulum without inflammation. Decompressed small bowel without obstruction or inflammatory change. Occasional fecalization of distal small bowel contents. No terminal ileal inflammation. Diminutive appendix tentatively visualized, series 2, image 79. No appendicitis. Moderate stool burden in the colon. Mild sigmoid diverticulosis without diverticulitis. There is no colonic wall thickening or inflammation. Vascular/Lymphatic: Minimal aortic atherosclerosis. No aortic aneurysm. Patent portal vein. Small retroperitoneal lymph nodes are not enlarged by size criteria. There are no enlarged lymph nodes in the abdomen or pelvis. Reproductive: Status post hysterectomy. No adnexal masses. Other: Fat in both inguinal canals. Small fat containing umbilical hernia. No free air, free fluid, or intra-abdominal fluid collection. Musculoskeletal: There are no acute or suspicious osseous abnormalities. IMPRESSION: 1. No acute abnormality in the abdomen/pelvis. 2. Subpleural ground-glass opacities in the lung bases may represent atelectasis or interstitial lung disease,  however recommend correlation for symptoms of and testing for COVID-19. 3. Hepatic steatosis. 4. Absent renal excretion on delayed phase imaging suggesting underlying renal dysfunction. 5. Mild sigmoid diverticulosis without diverticulitis. 6. Small hiatal hernia. 7. Small fat containing umbilical hernia. Fat in both inguinal canals. Aortic Atherosclerosis (ICD10-I70.0). Electronically Signed   By: Keith Rake M.D.   On: 02/03/2020 00:56    ____________________________________________   PROCEDURES  Procedure(s) performed (including Critical Care):  .1-3 Lead EKG Interpretation Performed by: Lucrezia Starch, MD Authorized by: Lucrezia Starch, MD     Interpretation: normal     ECG rate assessment: normal     Rhythm: sinus rhythm     Ectopy: none     Conduction: normal       ____________________________________________   INITIAL IMPRESSION / ASSESSMENT AND PLAN / ED COURSE        Patient presents with above-stated exam for assessment of 2 days of right-sided flank pain that she thinks began after she did some heavy lifting 2 days ago. No associated symptoms or prior similar episodes. She was sent to emergency room for assessment after initial work-up in urgent care showed WBC count of 26,000. On arrival patient is afebrile and hemodynamically stable.  I reviewed patient's labs as well as her CBC which showed WBC count of 26,000 compared to 20,003 weeks ago. Patient is chronically elevated due to her CLL. Her CMP showed no evidence of significant ocular metabolic derangements. No evidence of cholestasis or hepatitis. UA showed no evidence of infection. Lipase of 114 is not consistent with acute pancreatitis.  Differential includes ACS,kidney stone, cholecystitis, colitis, and MSK.  ECG shows no evidence of ischemia patient denies any chest pain.  Low suspicion for ACS at this time.  CT and pelvis shows no evidence of acute intra-abdominal process but this is possible  groundglass opacities in the right lung base.  We will plan to follow this up with chest x-ray.  No other acute intra-abdominal process including evidence of cholecystitis, pancreatitis, cholangitis, colitis, kidney stone or other clear acute process.  Patient was noted  to briefly desatted to 86% with complete 2 L nasal cannula after receiving her Dilaudid. However on my reassessment she is noted to have an SPO2 of 94% on room air. She also states that is not abnormal for her while she is dozing to have her oxygen dropped slightly as she does not currently have her CPAP and has OSA requiring CPAP at night. I did review with her her x-rays and CT concerning for possible atypical pneumonia. Low suspicion for bacterial pneumonia or sepsis at this time although will write short course of doxy to cover for possible atypical pathologies. Advised patient that while she is being discharged she has a pending Covid testing should consider herself infectious until she is able to see the results online on my chart. Discharge stable condition. Strict return precautions advised and discussed.   ____________________________________________   FINAL CLINICAL IMPRESSION(S) / ED DIAGNOSES  Final diagnoses:  Atypical pneumonia  Person under investigation for COVID-19    Medications  lidocaine (LIDODERM) 5 % 1 patch (has no administration in time range)  acetaminophen (TYLENOL) tablet 1,000 mg (has no administration in time range)  HYDROmorphone (DILAUDID) injection 0.5 mg (0.5 mg Intravenous Given 02/02/20 2358)  iohexol (OMNIPAQUE) 300 MG/ML solution 100 mL (100 mLs Intravenous Contrast Given 02/03/20 0028)     ED Discharge Orders         Ordered    doxycycline (VIBRAMYCIN) 100 MG capsule  2 times daily        02/03/20 0212           Shaw:  This document was prepared using Dragon voice recognition software and may include unintentional dictation errors.   Lucrezia Starch, MD 02/03/20 [phone]

## 2020-02-02 NOTE — ED Notes (Signed)
Patient was notified on lab test results. Patient was told to go to the Emergency Department.

## 2020-02-02 NOTE — ED Triage Notes (Signed)
Patient c/o RT sided flank pain  x 2 days.    Patient denies any urinary problems.   Patient endorses laying down and "when taking bra" off makes pain worst.    History of "pancreatic problems" and non-alcoholic fatty liver disease.

## 2020-02-03 ENCOUNTER — Emergency Department: Payer: Medicare HMO

## 2020-02-03 ENCOUNTER — Encounter: Payer: Self-pay | Admitting: Radiology

## 2020-02-03 DIAGNOSIS — R0902 Hypoxemia: Secondary | ICD-10-CM | POA: Diagnosis not present

## 2020-02-03 DIAGNOSIS — K760 Fatty (change of) liver, not elsewhere classified: Secondary | ICD-10-CM | POA: Diagnosis not present

## 2020-02-03 DIAGNOSIS — J189 Pneumonia, unspecified organism: Secondary | ICD-10-CM | POA: Diagnosis not present

## 2020-02-03 DIAGNOSIS — R109 Unspecified abdominal pain: Secondary | ICD-10-CM | POA: Diagnosis not present

## 2020-02-03 LAB — LACTIC ACID, PLASMA: Lactic Acid, Venous: 0.8 mmol/L (ref 0.5–1.9)

## 2020-02-03 LAB — RESP PANEL BY RT-PCR (FLU A&B, COVID) ARPGX2
Influenza A by PCR: NEGATIVE
Influenza B by PCR: NEGATIVE
SARS Coronavirus 2 by RT PCR: NEGATIVE

## 2020-02-03 LAB — PROCALCITONIN: Procalcitonin: <0.1 ng/mL

## 2020-02-03 MED ORDER — LIDOCAINE 5 % EX PTCH
1.0000 | MEDICATED_PATCH | CUTANEOUS | Status: DC
  Administered 2020-02-03: 1 via TRANSDERMAL
  Filled 2020-02-03: qty 1

## 2020-02-03 MED ORDER — IOHEXOL 300 MG/ML  SOLN
100.0000 mL | Freq: Once | INTRAMUSCULAR | Status: AC | PRN
  Administered 2020-02-03: 100 mL via INTRAVENOUS

## 2020-02-03 MED ORDER — ACETAMINOPHEN 500 MG PO TABS: 1000.0000 mg | ORAL_TABLET | Freq: Once | ORAL | Status: DC

## 2020-02-03 MED ORDER — DOXYCYCLINE HYCLATE 100 MG PO CAPS: 100.0000 mg | ORAL_CAPSULE | Freq: Two times a day (BID) | ORAL | 0 refills | Status: DC

## 2020-02-03 NOTE — ED Notes (Signed)
O2 sat noted to be 86% with patient on RA. Patient placed on 2L [place] for supplemental O2.  EDP notified, new orders placed.

## 2020-02-04 ENCOUNTER — Encounter: Payer: Self-pay | Admitting: Family Medicine

## 2020-02-05 ENCOUNTER — Encounter: Payer: Self-pay | Admitting: Family Medicine

## 2020-02-05 ENCOUNTER — Other Ambulatory Visit: Payer: Self-pay

## 2020-02-05 ENCOUNTER — Ambulatory Visit (INDEPENDENT_AMBULATORY_CARE_PROVIDER_SITE_OTHER): Payer: Medicare HMO | Admitting: Family Medicine

## 2020-02-05 VITALS — BP 140/70 | HR 66 | Temp 96.4°F | Ht 65.0 in | Wt 208.1 lb

## 2020-02-05 DIAGNOSIS — B029 Zoster without complications: Secondary | ICD-10-CM

## 2020-02-05 DIAGNOSIS — I700 Atherosclerosis of aorta: Secondary | ICD-10-CM | POA: Insufficient documentation

## 2020-02-05 MED ORDER — GABAPENTIN 300 MG PO CAPS: ORAL_CAPSULE | ORAL | 1 refills | Status: DC

## 2020-02-05 MED ORDER — VALACYCLOVIR HCL 1 G PO TABS: 1000.0000 mg | ORAL_TABLET | Freq: Three times a day (TID) | ORAL | 0 refills | Status: DC

## 2020-02-05 NOTE — Assessment & Plan Note (Signed)
Story/exam consistent with shingles.  Stop doxy and muscle relaxant.  Rx valtrex 1000mg  TID x 7 days, gabapentin for neuropathy with sedation precautions - start 300mg  nightly for 2 days then increase to BID for 2 days then TID if needed.  Update with effect. Discussed possible PHN complication - will monitor. Consider shingrix series in 1 year.

## 2020-02-05 NOTE — Assessment & Plan Note (Signed)
Incidental finding on imaging study.

## 2020-02-05 NOTE — Telephone Encounter (Signed)
I've asked pt to come in today at 4:30pm. plz see if able and schedule.

## 2020-02-05 NOTE — Progress Notes (Signed)
Patient ID: Lori Shaw, female    DOB: 29-Jun-1945, 75 y.o.   MRN: [id number]  This visit was conducted in person.  BP 140/70 (BP Location: Right Arm, Patient Position: Sitting, Cuff Size: Normal)   Pulse 66   Temp (!) 96.4 F (35.8 C) (Temporal)   Ht 5\' 5"  (1.651 m)   Wt 208 lb 1.6 oz (94.4 kg)   SpO2 95%   BMI 34.63 kg/m    CC: check rash  Subjective:   HPI: Lori Shaw is a 75 y.o. female presenting on 02/05/2020 for Medication Reaction (Pt stated that she feels like she had an reaction to one of her medication. The rash is on her back and on her Rt side )   ER and urgent care records reviewed.  Recent UCC then ER visit 02/02/2020 for R lateral side pain (may have started after changing large flag in flag pole and/or lifting 40 lb bags of salt), found to have elevated WBC at [facility] so referred to ER. However in h/o CLL she has chronic leukocytosis. ER workup reviewed - CT abd/pelvis showed no acute intra abdominal pathology but possible ground glass opacities of BLL - atx vs ILD. CXR showed Ill-defined opacities at both lung bases, atelectasis versus atypical pneumonia  - treated for possible atypical pneumonia with doxycycline course. However she didn't have significant respiratory symptoms of dyspnea, cough or fever. COVID test returned negative.   zostavax 2013 Has not had shingrix .     Relevant past medical, surgical, family and social history reviewed and updated as indicated. Interim medical history since our last visit reviewed. Allergies and medications reviewed and updated. Outpatient Medications Prior to Visit  Medication Sig Dispense Refill  . B Complex Vitamins (B COMPLEX 1 PO) Take 1 tablet by mouth daily.    . Cholecalciferol (VITAMIN D) 125 MCG (5000 UT) CAPS Take 1 capsule by mouth daily. 30 capsule   . Cyanocobalamin (VITAMIN B-12) 2500 MCG SUBL Place 1 tablet (2,500 mcg total) under the tongue daily.  0  . fluticasone (FLONASE) 50 MCG/ACT  nasal spray Place 2 sprays into both nostrils daily as needed. 16 g 3  . Multiple Vitamins-Minerals (PRESERVISION AREDS) TABS Take 2 tablets by mouth daily.    . naproxen (NAPROSYN) 500 MG tablet Take 1 tablet (500 mg total) by mouth 2 (two) times daily. 30 tablet 0  . rosuvastatin (CRESTOR) 10 MG tablet Take 1 tablet (10 mg total) by mouth daily. 90 tablet 3  . valACYclovir (VALTREX) 1000 MG tablet Take 1 tablet (1,000 mg total) by mouth 3 (three) times daily. 21 tablet 0  . doxycycline (VIBRAMYCIN) 100 MG capsule Take 1 capsule (100 mg total) by mouth 2 (two) times daily for 7 days. 14 capsule 0  . tiZANidine (ZANAFLEX) 4 MG tablet Take 1 tablet (4 mg total) by mouth every 6 (six) hours as needed for muscle spasms. 30 tablet 0   No facility-administered medications prior to visit.     Per HPI unless specifically indicated in ROS section below Review of Systems Objective:  BP 140/70 (BP Location: Right Arm, Patient Position: Sitting, Cuff Size: Normal)   Pulse 66   Temp (!) 96.4 F (35.8 C) (Temporal)   Ht 5\' 5"  (1.651 m)   Wt 208 lb 1.6 oz (94.4 kg)   SpO2 95%   BMI 34.63 kg/m   Wt Readings from Last 3 Encounters:  02/05/20 208 lb 1.6 oz (94.4 kg)  02/02/20 230 lb (104.3  kg)  01/09/20 209 lb 9.6 oz (95.1 kg)      Physical Exam Vitals and nursing note reviewed.  Constitutional:      Appearance: Normal appearance. She is not ill-appearing.  Cardiovascular:     Rate and Rhythm: Normal rate and regular rhythm.     Pulses: Normal pulses.     Heart sounds: Normal heart sounds. No murmur heard.   Pulmonary:     Effort: Pulmonary effort is normal. No respiratory distress.     Breath sounds: Normal breath sounds. No wheezing, rhonchi or rales.  Musculoskeletal:     Right lower leg: No edema.     Left lower leg: No edema.  Skin:    General: Skin is dry.     Findings: Rash present. Rash is vesicular.          Comments: Erythematous patchy rash with clusters of blisters in T10  dermatome along right side of body  Neurological:     Mental Status: She is alert.       Results for orders placed or performed during the hospital encounter of 02/02/20  Resp Panel by RT-PCR (Flu A&B, Covid) Nasopharyngeal Swab   Specimen: Nasopharyngeal Swab; Nasopharyngeal(NP) swabs in vial transport medium  Result Value Ref Range   SARS Coronavirus 2 by RT PCR NEGATIVE NEGATIVE   Influenza A by PCR NEGATIVE NEGATIVE   Influenza B by PCR NEGATIVE NEGATIVE  Lactic acid, plasma  Result Value Ref Range   Lactic Acid, Venous 0.8 0.5 - 1.9 mmol/L  Procalcitonin - Baseline  Result Value Ref Range   Procalcitonin <0.10 ng/mL   Assessment & Plan:  This visit occurred during the SARS-CoV-2 public health emergency.  Safety protocols were in place, including screening questions prior to the visit, additional usage of staff PPE, and extensive cleaning of exam room while observing appropriate contact time as indicated for disinfecting solutions.   Problem List Items Addressed This Visit    Shingles - Primary    Story/exam consistent with shingles.  Stop doxy and muscle relaxant.  Rx valtrex 1000mg  TID x 7 days, gabapentin for neuropathy with sedation precautions - start 300mg  nightly for 2 days then increase to BID for 2 days then TID if needed.  Update with effect. Discussed possible PHN complication - will monitor. Consider shingrix series in 1 year.       Atherosclerosis of aorta ([place])    Incidental finding on imaging study.           Meds ordered this encounter  Medications  . gabapentin (NEURONTIN) 300 MG capsule    Sig: Take 1 capsule (300 mg total) by mouth at bedtime for 2 days, THEN 1 capsule (300 mg total) 2 (two) times daily.    Dispense:  60 capsule    Refill:  1   No orders of the defined types were placed in this encounter.   Patient instructions: You have shingles Stop doxycycline and tizanidine.  Start valtrex antiviral.  Start gabapentin 300mg  nerve pain  medicine - nightly for 2 nights to ensure tolerated well then may increase dose as discussed. Let us know if gabapentin dose to strong (to drop dose to 100mg )  May use tylenol and/or naprosyn for pain.  Let us know if pain not controlled despite above.  Follow up plan: Return if symptoms worsen or fail to improve.  Ria Bush, MD

## 2020-02-05 NOTE — Patient Instructions (Addendum)
You have shingles Stop doxycycline and tizanidine.  Start valtrex antiviral.  Start gabapentin 300mg  nerve pain medicine - nightly for 2 nights to ensure tolerated well then may increase dose as discussed. Let us know if gabapentin dose to strong (to drop dose to 100mg )  May use tylenol and/or naprosyn for pain.  Let us know if pain not controlled despite above.  Shingles  Shingles, which is also known as herpes zoster, is an infection that causes a painful skin rash and fluid-filled blisters. It is caused by a virus. Shingles only develops in people who:  Have had chickenpox.  Have been given a medicine to protect against chickenpox (have been vaccinated). Shingles is rare in this group. What are the causes? Shingles is caused by varicella-zoster virus (VZV). This is the same virus that causes chickenpox. After a person is exposed to VZV, the virus stays in the body in an inactive (dormant) state. Shingles develops if the virus is reactivated. This can happen many years after the first (initial) exposure to VZV. It is not known what causes this virus to be reactivated. What increases the risk? People who have had chickenpox or received the chickenpox vaccine are at risk for shingles. Shingles infection is more common in people who:  Are older than age 56.  Have a weakened disease-fighting system (immune system), such as people with: ? HIV. ? AIDS. ? Cancer.  Are taking medicines that weaken the immune system, such as transplant medicines.  Are experiencing a lot of stress. What are the signs or symptoms? Early symptoms of this condition include itching, tingling, and pain in an area on your skin. Pain may be described as burning, stabbing, or throbbing. A few days or weeks after early symptoms start, a painful red rash appears. The rash is usually on one side of the body and has a band-like or belt-like pattern. The rash eventually turns into fluid-filled blisters that break open,  change into scabs, and dry up in about 2-3 weeks. At any time during the infection, you may also develop:  A fever.  Chills.  A headache.  An upset stomach. How is this diagnosed? This condition is diagnosed with a skin exam. Skin or fluid samples may be taken from the blisters before a diagnosis is made. These samples are examined under a microscope or sent to a lab for testing. How is this treated? The rash may last for several weeks. There is not a specific cure for this condition. Your health care provider will probably prescribe medicines to help you manage pain, recover more quickly, and avoid long-term problems. Medicines may include:  Antiviral drugs.  Anti-inflammatory drugs.  Pain medicines.  Anti-itching medicines (antihistamines). If the area involved is on your face, you may be referred to a specialist, such as an eye doctor (ophthalmologist) or an ear, nose, and throat (ENT) doctor (otolaryngologist) to help you avoid eye problems, chronic pain, or disability. Follow these instructions at home: Medicines  Take over-the-counter and prescription medicines only as told by your health care provider.  Apply an anti-itch cream or numbing cream to the affected area as told by your health care provider. Relieving itching and discomfort  Apply cold, wet cloths (cold compresses) to the area of the rash or blisters as told by your health care provider.  Cool baths can be soothing. Try adding baking soda or dry oatmeal to the water to reduce itching. Do not bathe in hot water.   Blister and rash care  Keep  your rash covered with a loose bandage (dressing). Wear loose-fitting clothing to help ease the pain of material rubbing against the rash.  Keep your rash and blisters clean by washing the area with mild soap and cool water as told by your health care provider.  Check your rash every day for signs of infection. Check for: ? More redness, swelling, or pain. ? Fluid or  blood. ? Warmth. ? Pus or a bad smell.  Do not scratch your rash or pick at your blisters. To help avoid scratching: ? Keep your fingernails clean and cut short. ? Wear gloves or mittens while you sleep, if scratching is a problem. General instructions  Rest as told by your health care provider.  Keep all follow-up visits as told by your health care provider. This is important.  Wash your hands often with soap and water. If soap and water are not available, use hand sanitizer. Doing this lowers your chance of getting a bacterial skin infection.  Before your blisters change into scabs, your shingles infection can cause chickenpox in people who have never had it or have never been vaccinated against it. To prevent this from happening, avoid contact with other people, especially: ? Babies. ? Pregnant women. ? Children who have eczema. ? Elderly people who have transplants. ? People who have chronic illnesses, such as cancer or AIDS. Contact a health care provider if:  Your pain is not relieved with prescribed medicines.  Your pain does not get better after the rash heals.  You have signs of infection in the rash area, such as: ? More redness, swelling, or pain around the rash. ? Fluid or blood coming from the rash. ? The rash area feeling warm to the touch. ? Pus or a bad smell coming from the rash. Get help right away if:  The rash is on your face or nose.  You have facial pain, pain around your eye area, or loss of feeling on one side of your face.  You have difficulty seeing.  You have ear pain or have ringing in your ear.  You have a loss of taste.  Your condition gets worse. Summary  Shingles, which is also known as herpes zoster, is an infection that causes a painful skin rash and fluid-filled blisters.  This condition is diagnosed with a skin exam. Skin or fluid samples may be taken from the blisters and examined before the diagnosis is made.  Keep your rash  covered with a loose bandage (dressing). Wear loose-fitting clothing to help ease the pain of material rubbing against the rash.  Before your blisters change into scabs, your shingles infection can cause chickenpox in people who have never had it or have never been vaccinated against it. This information is not intended to replace advice given to you by your health care provider. Make sure you discuss any questions you have with your health care provider. Document Revised: 05/06/2018 Document Reviewed: 09/16/2016 Elsevier Patient Education  2021 [employer].

## 2020-02-07 ENCOUNTER — Ambulatory Visit: Payer: Medicare HMO | Admitting: Family Medicine

## 2020-02-12 ENCOUNTER — Encounter: Payer: Self-pay | Admitting: Family Medicine

## 2020-02-13 MED ORDER — TRAMADOL HCL 50 MG PO TABS: 50.0000 mg | ORAL_TABLET | Freq: Two times a day (BID) | ORAL | 0 refills | Status: DC | PRN

## 2020-02-13 MED ORDER — GABAPENTIN 300 MG PO CAPS: 600.0000 mg | ORAL_CAPSULE | Freq: Two times a day (BID) | ORAL | 1 refills | Status: DC

## 2020-02-13 NOTE — Addendum Note (Signed)
Addended by: Ria Bush on: 02/13/2020 11:36 AM   Modules accepted: Orders

## 2020-02-15 DIAGNOSIS — G4733 Obstructive sleep apnea (adult) (pediatric): Secondary | ICD-10-CM | POA: Diagnosis not present

## 2020-02-15 IMAGING — MG DIGITAL SCREENING BILATERAL MAMMOGRAM WITH TOMO AND CAD
6 of 10 series · 6 of 30 positions shown · non-contrast
Comparison: Previous exam(s).

CLINICAL DATA: Screening.

EXAM:
DIGITAL SCREENING BILATERAL MAMMOGRAM WITH TOMO AND CAD

[R CV synth-2D]
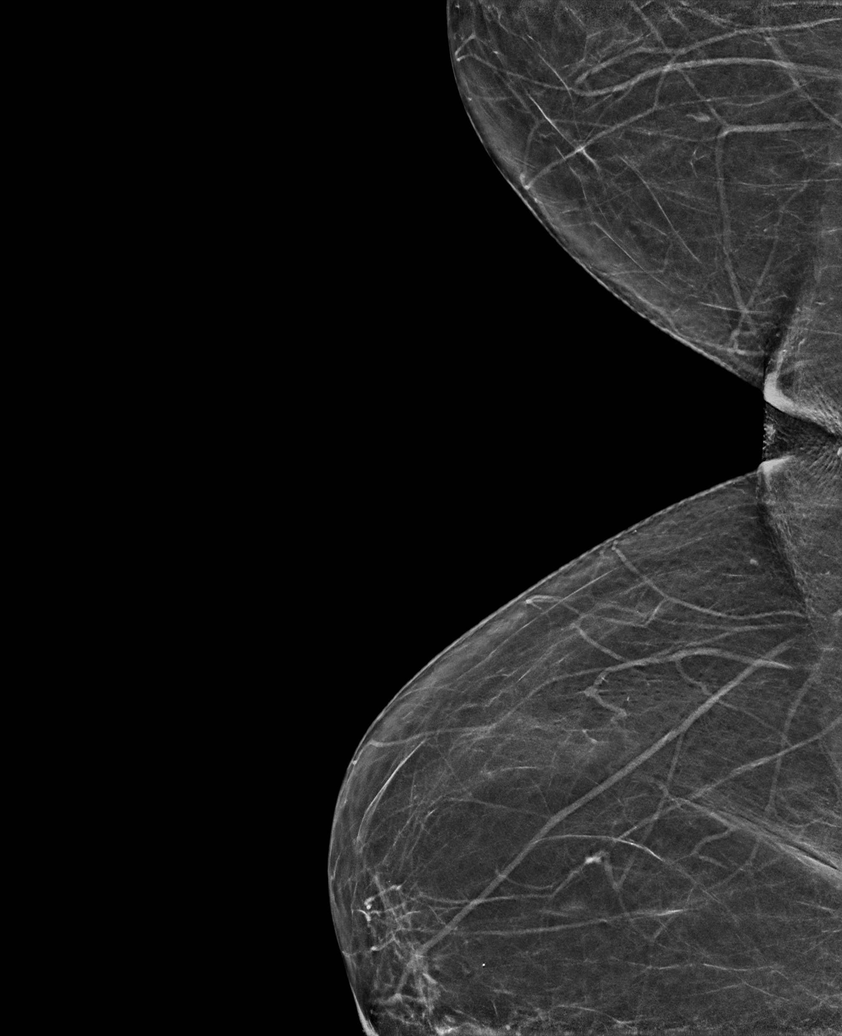

[L MLO synth-2D]
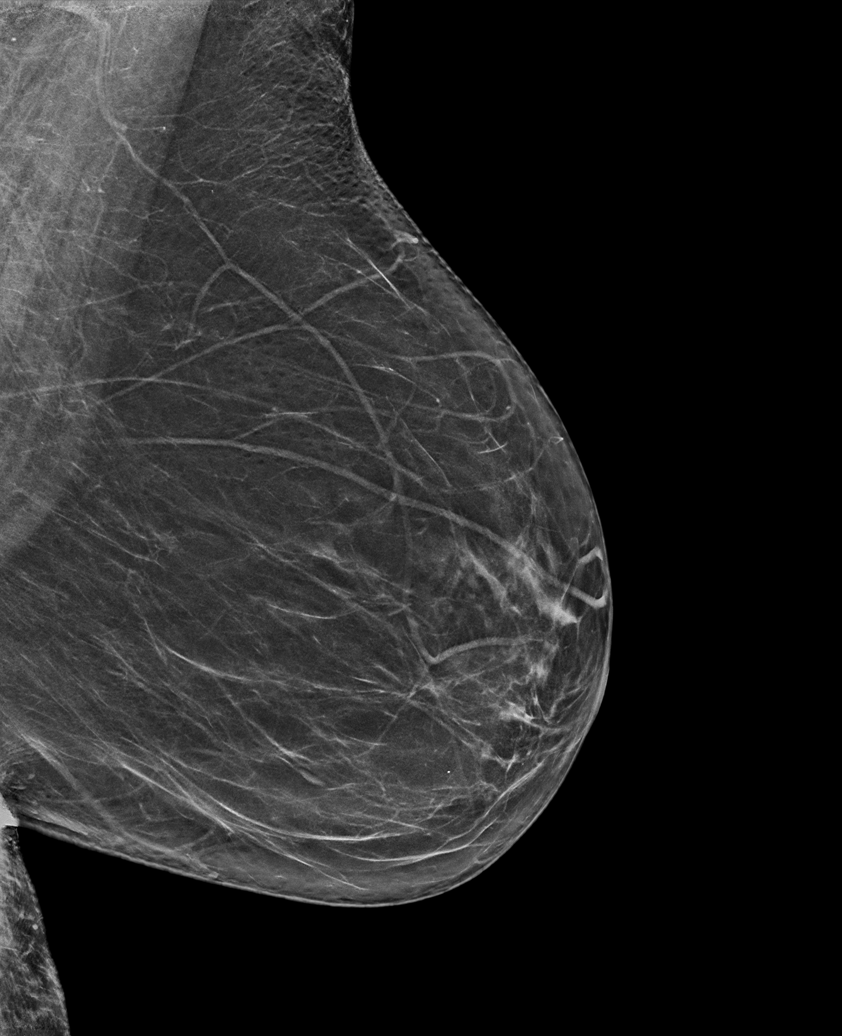

[R CC synth-2D]
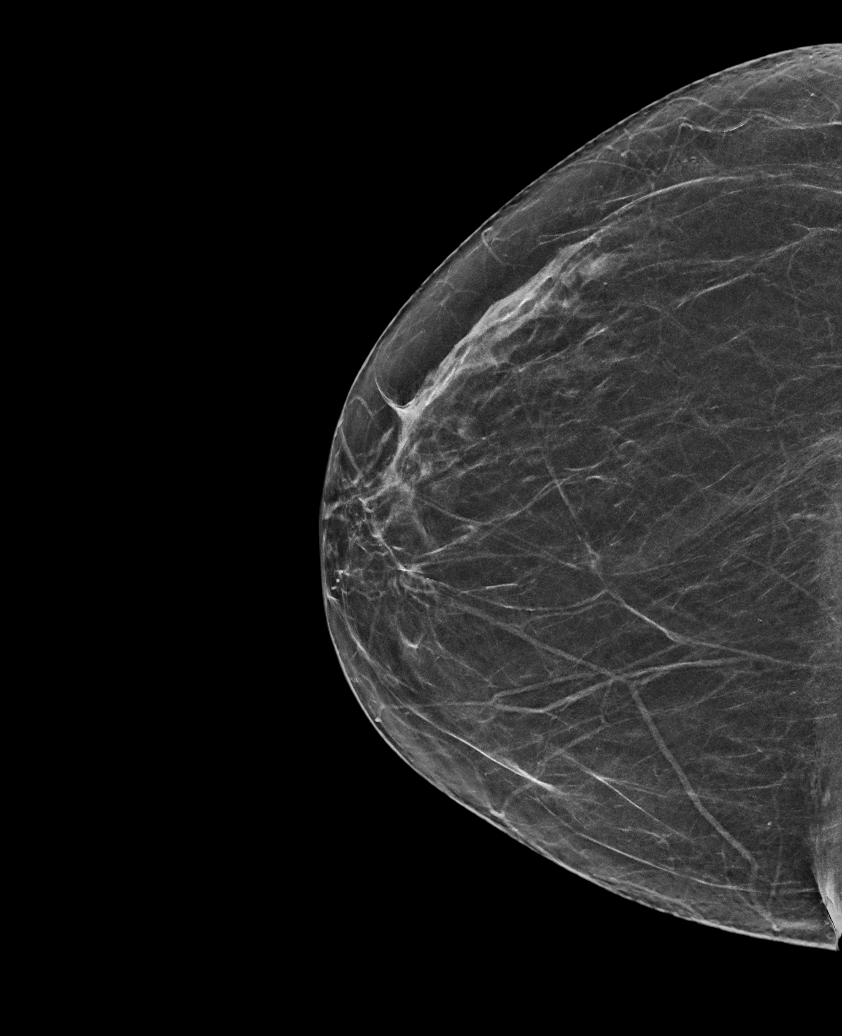

[R MLO synth-2D]
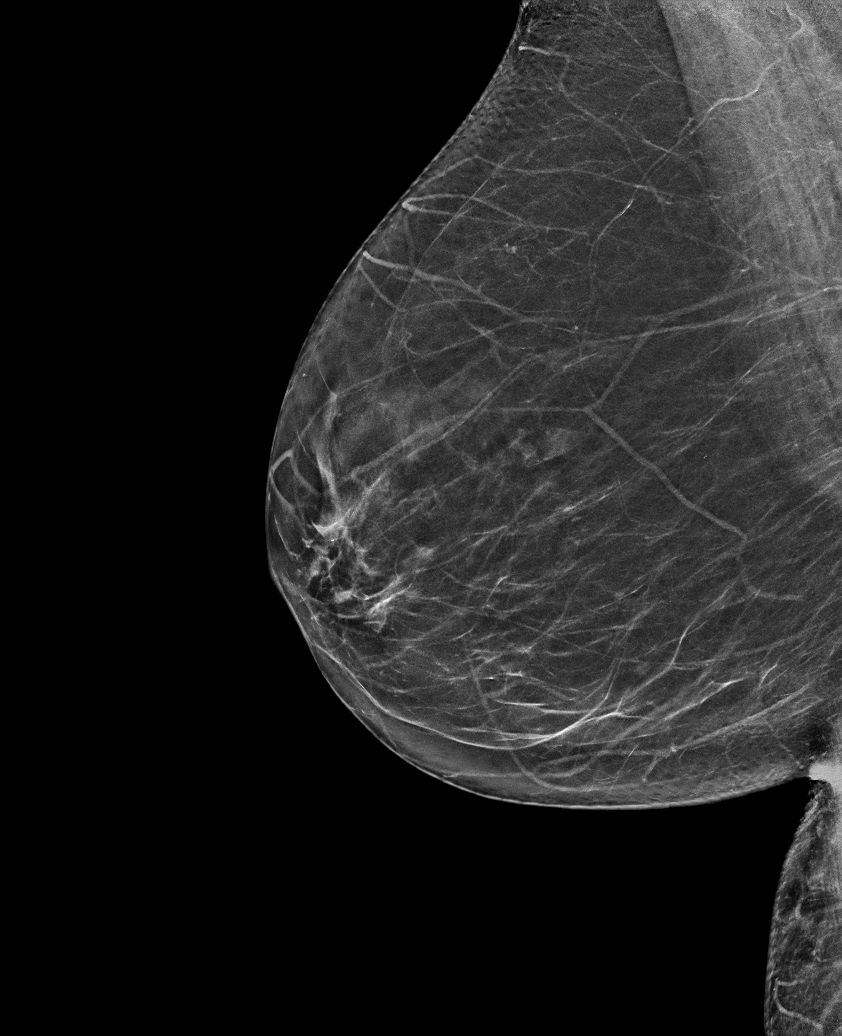

[L CC synth-2D]
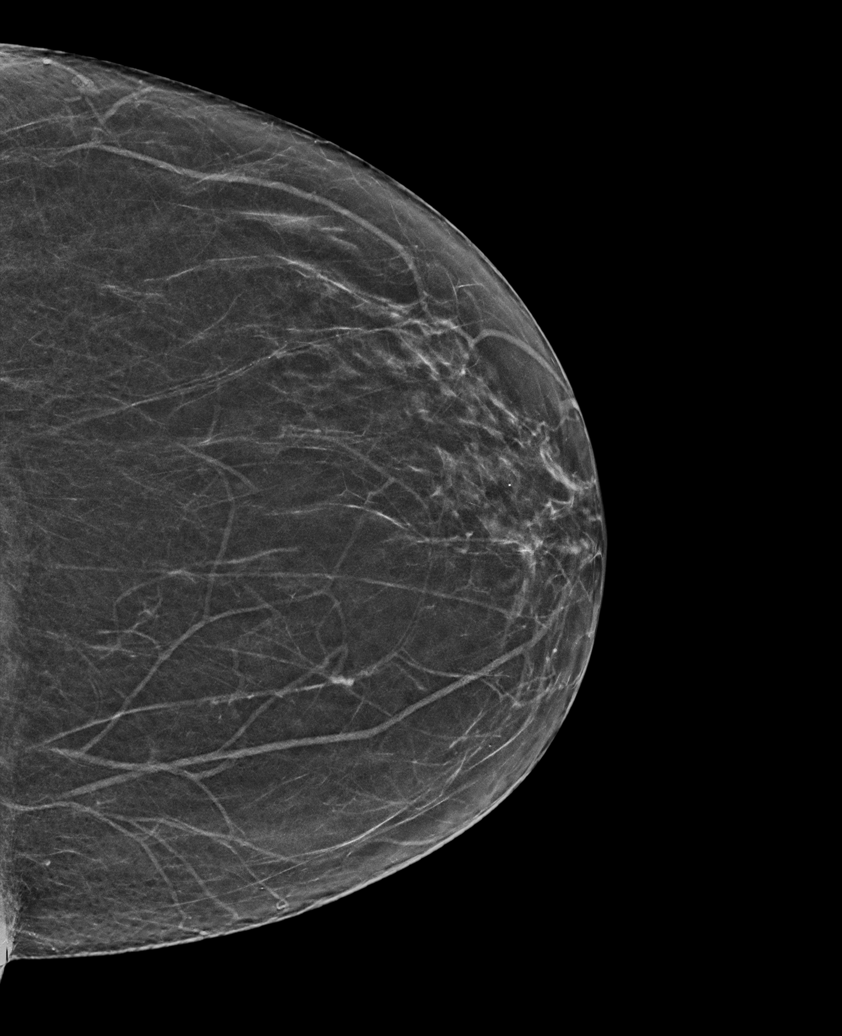

[R CC tomo · tomo slice 31/62.0]
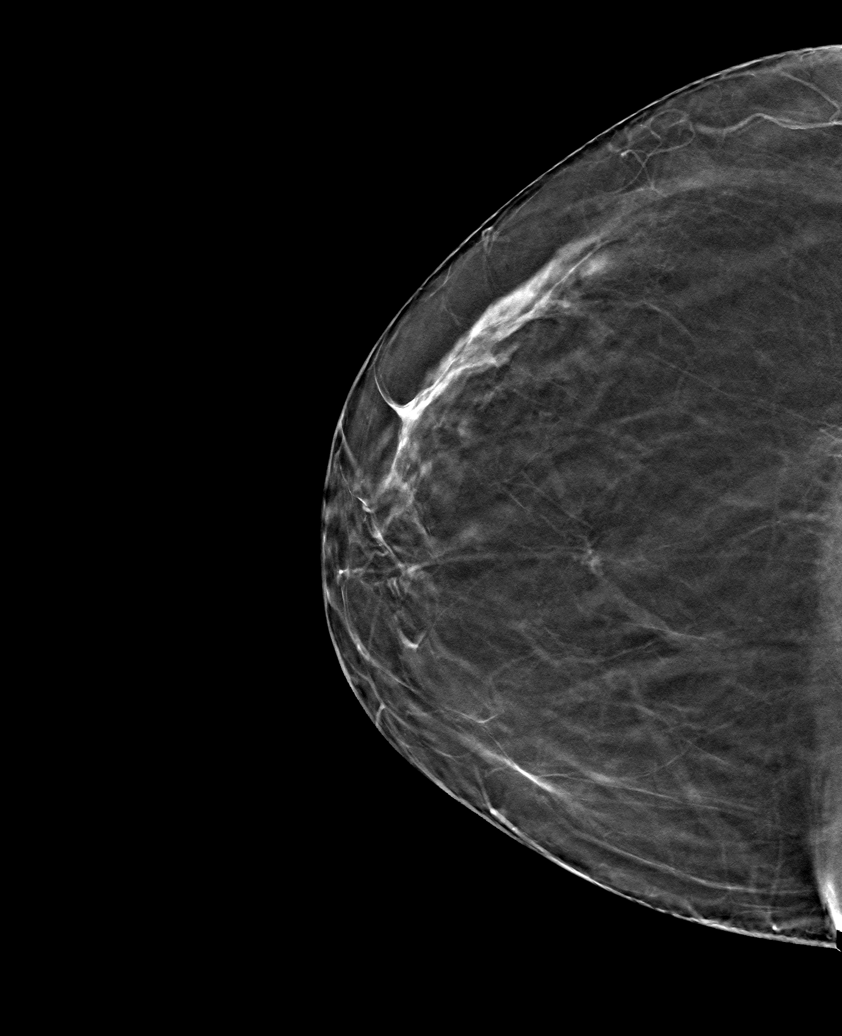

[6 of 30 positions shown; findings below may reference images not displayed]

ACR Breast Density Category b: There are scattered areas of
fibroglandular density.
FINDINGS: There are no findings suspicious for malignancy. Images were
processed with CAD.
IMPRESSION: No mammographic evidence of malignancy. A result letter of this
screening mammogram will be mailed directly to the patient.

RECOMMENDATION:
Screening mammogram in one year. (Code:[id number])

BI-RADS CATEGORY  1: Negative.

## 2020-02-19 MED ORDER — PREDNISONE 20 MG PO TABS: 40.0000 mg | ORAL_TABLET | Freq: Every day | ORAL | 0 refills | Status: DC

## 2020-02-19 MED ORDER — TRAMADOL HCL 50 MG PO TABS: 50.0000 mg | ORAL_TABLET | Freq: Two times a day (BID) | ORAL | 0 refills | Status: DC | PRN

## 2020-02-19 NOTE — Addendum Note (Signed)
Addended by: Ria Bush on: 02/19/2020 09:57 AM   Modules accepted: Orders

## 2020-02-19 NOTE — Addendum Note (Signed)
Addended by: Ria Bush on: 02/19/2020 04:53 PM   Modules accepted: Orders

## 2020-02-23 ENCOUNTER — Telehealth: Payer: Self-pay

## 2020-02-23 ENCOUNTER — Ambulatory Visit (INDEPENDENT_AMBULATORY_CARE_PROVIDER_SITE_OTHER)
Admission: RE | Admit: 2020-02-23 | Discharge: 2020-02-23 | Disposition: A | Discharge status: Home / Self Care | Payer: Medicare HMO | Source: Ambulatory Visit | Attending: Family Medicine | Admitting: Family Medicine

## 2020-02-23 ENCOUNTER — Other Ambulatory Visit: Payer: Self-pay

## 2020-02-23 ENCOUNTER — Ambulatory Visit (INDEPENDENT_AMBULATORY_CARE_PROVIDER_SITE_OTHER): Payer: Medicare HMO | Admitting: Family Medicine

## 2020-02-23 ENCOUNTER — Encounter: Payer: Self-pay | Admitting: Family Medicine

## 2020-02-23 VITALS — BP 122/64 | HR 91 | Temp 97.5°F | Ht 65.0 in | Wt 211.0 lb

## 2020-02-23 DIAGNOSIS — B029 Zoster without complications: Secondary | ICD-10-CM | POA: Diagnosis not present

## 2020-02-23 DIAGNOSIS — C9110 Chronic lymphocytic leukemia of B-cell type not having achieved remission: Secondary | ICD-10-CM

## 2020-02-23 DIAGNOSIS — M25571 Pain in right ankle and joints of right foot: Secondary | ICD-10-CM | POA: Diagnosis not present

## 2020-02-23 DIAGNOSIS — M25471 Effusion, right ankle: Secondary | ICD-10-CM | POA: Diagnosis not present

## 2020-02-23 LAB — CBC WITH DIFFERENTIAL/PLATELET
Absolute Monocytes: 3277 cells/uL — ABNORMAL HIGH (ref 200–950)
Basophils Absolute: 103 cells/uL (ref 0–200)
Basophils Relative: 0.4 %
Eosinophils Absolute: 0 cells/uL — ABNORMAL LOW (ref 15–500)
Eosinophils Relative: 0 %
HCT: 43.9 % (ref 35.0–45.0)
Hemoglobin: 14.8 g/dL (ref 11.7–15.5)
Lymphs Abs: 13932 cells/uL — ABNORMAL HIGH (ref 850–3900)
MCH: 31.2 pg (ref 27.0–33.0)
MCHC: 33.7 g/dL (ref 32.0–36.0)
MCV: 92.4 fL (ref 80.0–100.0)
MPV: 11 fL (ref 7.5–12.5)
Monocytes Relative: 12.7 %
Neutro Abs: 8488 cells/uL — ABNORMAL HIGH (ref 1500–7800)
Neutrophils Relative %: 32.9 %
Platelets: 257 10*3/uL (ref 140–400)
RBC: 4.75 10*6/uL (ref 3.80–5.10)
RDW: 13.1 % (ref 11.0–15.0)
Total Lymphocyte: 54 %
WBC: 25.8 10*3/uL — ABNORMAL HIGH (ref 3.8–10.8)

## 2020-02-23 NOTE — Assessment & Plan Note (Addendum)
Rash resolving well.  Post herpetic neuralgia sequelae.  Discussed gabapentin use.

## 2020-02-23 NOTE — Telephone Encounter (Signed)
Emily front office mgr got note that pt had a knot appear on inside of rt ankle and pts husband thinks could be blood clot. I spoke with pt;pt said on 02/22/20 pt had pain in rt ankle and when she looked there was a knot the size of a golf ball on rt ankle, red and purple in color, painful when touch knot or moves ankle and tries to lay on that side. Today knot size of 50 cent piece and still painful and discolored; no red streaks from knot; no CP or SOB. pts husband concerned could be blood clot. No covid symptoms or known exposure to + covid. I spoke with Dr Darnell Level who will see pt today at 4 PM. UC & D precautions given and pt voiced understanding.

## 2020-02-23 NOTE — Patient Instructions (Addendum)
Looks like hematoma under the skin, but not blood clot.  Labs today, xray today.  Elevate leg, warm compress to leg, may use topical rub like voltaren gel.  Let us know if not improving with this.   Hematoma A hematoma is a collection of blood under the skin, in an organ, in a body space, in a joint space, or in other tissue. The blood can thicken (clot) to form a lump that you can see and feel. The lump is often firm and may become sore and tender. Most hematomas get better in a few days to weeks. However, some hematomas may be serious and require medical care. Hematomas can range from very small to very large. What are the causes? This condition is caused by:  A blunt or penetrating injury.  A leakage from a blood vessel under the skin.  Some medical procedures, including surgeries, such as oral surgery, face lifts, and surgeries on the joints.  Some medical conditions that cause bleeding or bruising. There may be multiple hematomas that appear in different areas of the body. What increases the risk? You are more likely to develop this condition if:  You are an older adult.  You use blood thinners. What are the signs or symptoms? Symptoms of this condition depend on where the hematoma is located.  Common symptoms of a hematoma that is under the skin include:  A firm lump on the body.  Pain and tenderness in the area.  Bruising. Blue, dark blue, purple-red, or yellowish skin (discoloration) may appear at the site of the hematoma if the hematoma is close to the surface of the skin. Common symptoms of a hematoma that is deep in the tissues or body spaces may be less obvious. They include:  A collection of blood in the stomach (intra-abdominal hematoma). This may cause pain in the abdomen, weakness, fainting, and shortness of breath.  A collection of blood in the head (intracranial hematoma). This may cause a headache or symptoms such as weakness, trouble speaking or understanding,  or a change in consciousness.   How is this diagnosed? This condition is diagnosed based on:  Your medical history.  A physical exam.  Imaging tests, such as an ultrasound or CT scan. These may be needed if your health care provider suspects a hematoma in deeper tissues or body spaces.  Blood tests. These may be needed if your health care provider believes that the hematoma is caused by a medical condition. How is this treated? Treatment for this condition depends on the cause, size, and location of the hematoma. Treatment may include:  Doing nothing. The majority of hematomas do not need treatment as many of them go away on their own over time.  Surgery or close monitoring. This may be needed for large hematomas or hematomas that affect vital organs.  Medicines. Medicines may be given if there is an underlying medical cause for the hematoma. Follow these instructions at home: Managing pain, stiffness, and swelling  If directed, put ice on the affected area. ? Put ice in a plastic bag. ? Place a towel between your skin and the bag. ? Leave the ice on for 20 minutes, 2-3 times a day for the first couple of days.  If directed, apply heat to the affected area after applying ice for a couple of days. Use the heat source that your health care provider recommends, such as a moist heat pack or a heating pad. ? Place a towel between your skin and  the heat source. ? Leave the heat on for 20-30 minutes. ? Remove the heat if your skin turns bright red. This is especially important if you are unable to feel pain, heat, or cold. You may have a greater risk of getting burned.  Raise (elevate) the affected area above the level of your heart while you are sitting or lying down.  If told, wrap the affected area with an elastic bandage. The bandage applies pressure (compression) to the area, which may help to reduce swelling and promote healing. Do not wrap the bandage too tightly around the  affected area.  If your hematoma is on a leg or foot (lower extremity) and is painful, your health care provider may recommend crutches. Use them as told by your health care provider.   General instructions  Take over-the-counter and prescription medicines only as told by your health care provider.  Keep all follow-up visits as told by your health care provider. This is important. Contact a health care provider if:  You have a fever.  The swelling or discoloration gets worse.  You develop more hematomas. Get help right away if:  Your pain is worse or your pain is not controlled with medicine.  Your skin over the hematoma breaks or starts bleeding.  Your hematoma is in your chest or abdomen and you have weakness, shortness of breath, or a change in consciousness.  You have a hematoma on your scalp that is caused by a fall or injury, and you also have: ? A headache that gets worse. ? Trouble speaking or understanding speech. ? Weakness. ? Change in alertness or consciousness. Summary  A hematoma is a collection of blood under the skin, in an organ, in a body space, in a joint space, or in other tissue.  This condition usually does not need treatment because many hematomas go away on their own over time.  Large hematomas, or those that may affect vital organs, may need surgical drainage or monitoring. If the hematoma is caused by a medical condition, medicines may be prescribed.  Get help right away if your hematoma breaks or starts to bleed, you have shortness of breath, or you have a headache or trouble speaking after a fall. This information is not intended to replace advice given to you by your health care provider. Make sure you discuss any questions you have with your health care provider. Document Revised: 06/08/2018 Document Reviewed: 06/17/2017 Elsevier Patient Education  2021 [employer].

## 2020-02-23 NOTE — Progress Notes (Signed)
Patient ID: Lori Shaw, female    DOB: 03-Aug-1945, 75 y.o.   MRN: [id number]  This visit was conducted in person.  BP 122/64 (BP Location: Left Arm, Patient Position: Sitting, Cuff Size: Large)   Pulse 91   Temp (!) 97.5 F (36.4 C) (Temporal)   Ht 5\' 5"  (1.651 m)   Wt 211 lb (95.7 kg)   SpO2 97%   BMI 35.11 kg/m    CC: check knot on ankles Subjective:   HPI: Lori Shaw is a 75 y.o. female presenting on 02/23/2020 for Mass (C/o knot medial side of bilateral ankles.  Noticed last night.  Areas are tender to touch, worse on right ankle. b)   Last night noticed knot on medial side of R ankle. Area tender to touch, swollen. Swelling has improved some. She treated with ice then heat as well as elevation and compression.  Denies inciting trauma/injury or falls.  Thinks L ankle starting to get sore as well.      Relevant past medical, surgical, family and social history reviewed and updated as indicated. Interim medical history since our last visit reviewed. Allergies and medications reviewed and updated. Outpatient Medications Prior to Visit  Medication Sig Dispense Refill  . Cholecalciferol (VITAMIN D) 125 MCG (5000 UT) CAPS Take 1 capsule by mouth daily. 30 capsule   . Cyanocobalamin (VITAMIN B-12) 2500 MCG SUBL Place 1 tablet (2,500 mcg total) under the tongue daily.  0  . fluticasone (FLONASE) 50 MCG/ACT nasal spray Place 2 sprays into both nostrils daily as needed. 16 g 3  . gabapentin (NEURONTIN) 300 MG capsule Take 2 capsules (600 mg total) by mouth 2 (two) times daily. 120 capsule 1  . Multiple Vitamins-Minerals (PRESERVISION AREDS) TABS Take 2 tablets by mouth daily.    . naproxen (NAPROSYN) 500 MG tablet Take 1 tablet (500 mg total) by mouth 2 (two) times daily. 30 tablet 0  . rosuvastatin (CRESTOR) 10 MG tablet Take 1 tablet (10 mg total) by mouth daily. 90 tablet 3  . traMADol (ULTRAM) 50 MG tablet Take 1 tablet (50 mg total) by mouth 2 (two)  times daily as needed for up to 5 days for moderate pain (sedation precautions). 15 tablet 0  . B Complex Vitamins (B COMPLEX 1 PO) Take 1 tablet by mouth daily.    . predniSONE (DELTASONE) 20 MG tablet Take 2 tablets (40 mg total) by mouth daily with breakfast. 8 tablet 0  . valACYclovir (VALTREX) 1000 MG tablet Take 1 tablet (1,000 mg total) by mouth 3 (three) times daily. 21 tablet 0   No facility-administered medications prior to visit.     Per HPI unless specifically indicated in ROS section below Review of Systems Objective:  BP 122/64 (BP Location: Left Arm, Patient Position: Sitting, Cuff Size: Large)   Pulse 91   Temp (!) 97.5 F (36.4 C) (Temporal)   Ht 5\' 5"  (1.651 m)   Wt 211 lb (95.7 kg)   SpO2 97%   BMI 35.11 kg/m   Wt Readings from Last 3 Encounters:  02/23/20 211 lb (95.7 kg)  02/05/20 208 lb 1.6 oz (94.4 kg)  02/02/20 230 lb (104.3 kg)      Physical Exam Vitals and nursing note reviewed.  Constitutional:      Appearance: Normal appearance. She is not ill-appearing.  Musculoskeletal:        General: Swelling and tenderness present. Normal range of motion.     Right lower leg: No edema.  Left lower leg: No edema.       Legs:     Comments: Tender soft tissue swelling just above R medial ankle without erythema or warmth  Skin:    General: Skin is warm and dry.     Findings: Bruising present. No erythema or rash.  Neurological:     Mental Status: She is alert.       Assessment & Plan:  This visit occurred during the SARS-CoV-2 public health emergency.  Safety protocols were in place, including screening questions prior to the visit, additional usage of staff PPE, and extensive cleaning of exam room while observing appropriate contact time as indicated for disinfecting solutions.   Problem List Items Addressed This Visit    Shingles    Rash resolving well.  Post herpetic neuralgia sequelae.  Discussed gabapentin use.       Right ankle swelling -  Primary    Exam most consistent with nontraumatic spontaneous hematoma given no inciting trauma. Not consistent with gabapentin related periph edema. Not consistent with DVT or cellulitis.  Reviewed with patient. Supportive care as per instructions In CLL hx, check CBC.  Check ankle xray.       Relevant Orders   DG Ankle Complete Right   CBC with Differential/Platelet   CLL (chronic lymphocytic leukemia) ([place])    Update CBC           No orders of the defined types were placed in this encounter.  Orders Placed This Encounter  Procedures  . DG Ankle Complete Right    Standing Status:   Future    Number of Occurrences:   1    Standing Expiration Date:   02/22/2021    Order Specific Question:   Reason for Exam (SYMPTOM  OR DIAGNOSIS REQUIRED)    Answer:   R medial ankle soft tissue swelling x 1 days    Order Specific Question:   Preferred imaging location?    Answer:   [facility]  . CBC with Differential/Platelet    Patient instructions: Looks like hematoma under the skin, but not blood clot.  Labs today, xray today.  Elevate leg, warm compress to leg, may use topical rub like voltaren gel.  Let us know if not improving with this.   Follow up plan: Return if symptoms worsen or fail to improve.  Ria Bush, MD

## 2020-02-23 NOTE — Assessment & Plan Note (Signed)
Update CBC. 

## 2020-02-23 NOTE — Telephone Encounter (Signed)
Pt seen today

## 2020-02-23 NOTE — Assessment & Plan Note (Addendum)
Exam most consistent with nontraumatic spontaneous hematoma given no inciting trauma. Not consistent with gabapentin related periph edema. Not consistent with DVT or cellulitis.  Reviewed with patient. Supportive care as per instructions In CLL hx, check CBC.  Check ankle xray.

## 2020-03-01 ENCOUNTER — Encounter: Payer: Self-pay | Admitting: Family Medicine

## 2020-03-01 MED ORDER — TRAMADOL HCL 50 MG PO TABS: 50.0000 mg | ORAL_TABLET | Freq: Two times a day (BID) | ORAL | 0 refills | Status: DC | PRN

## 2020-03-01 NOTE — Addendum Note (Signed)
Addended by: Ria Bush on: 03/01/2020 05:19 PM   Modules accepted: Orders

## 2020-03-04 ENCOUNTER — Ambulatory Visit: Payer: Medicare HMO | Admitting: Family Medicine

## 2020-03-07 DIAGNOSIS — H353112 Nonexudative age-related macular degeneration, right eye, intermediate dry stage: Secondary | ICD-10-CM | POA: Diagnosis not present

## 2020-03-07 DIAGNOSIS — H353221 Exudative age-related macular degeneration, left eye, with active choroidal neovascularization: Secondary | ICD-10-CM | POA: Diagnosis not present

## 2020-03-07 DIAGNOSIS — D3131 Benign neoplasm of right choroid: Secondary | ICD-10-CM | POA: Diagnosis not present

## 2020-03-07 DIAGNOSIS — D3132 Benign neoplasm of left choroid: Secondary | ICD-10-CM | POA: Diagnosis not present

## 2020-03-07 DIAGNOSIS — H25813 Combined forms of age-related cataract, bilateral: Secondary | ICD-10-CM | POA: Diagnosis not present

## 2020-03-07 MED ORDER — TEMAZEPAM 15 MG PO CAPS: 15.0000 mg | ORAL_CAPSULE | Freq: Every evening | ORAL | 0 refills | Status: DC | PRN

## 2020-03-07 NOTE — Addendum Note (Signed)
Addended by: Ria Bush on: 03/07/2020 07:53 PM   Modules accepted: Orders

## 2020-03-11 MED ORDER — HYDROCODONE-ACETAMINOPHEN 5-325 MG PO TABS: 1.0000 | ORAL_TABLET | Freq: Three times a day (TID) | ORAL | 0 refills | Status: DC | PRN

## 2020-03-11 MED ORDER — CAPSAICIN 0.05 % EX CREA: TOPICAL_CREAM | CUTANEOUS | 0 refills | Status: DC

## 2020-03-11 MED ORDER — NAPROXEN 500 MG PO TABS: 500.0000 mg | ORAL_TABLET | Freq: Two times a day (BID) | ORAL | 0 refills | Status: DC

## 2020-03-11 NOTE — Addendum Note (Signed)
Addended by: Ria Bush on: 03/11/2020 05:18 PM   Modules accepted: Orders

## 2020-03-14 ENCOUNTER — Telehealth: Payer: Self-pay

## 2020-03-14 DIAGNOSIS — G4701 Insomnia due to medical condition: Secondary | ICD-10-CM

## 2020-03-14 NOTE — Telephone Encounter (Signed)
Received faxed PA form from [facility] for temazepam 15 mg cap.  Placed form in Dr. Synthia Innocent box.

## 2020-03-15 NOTE — Telephone Encounter (Signed)
Spoke with pt asking if she is taking temazepam.  Says she is not.  I will discard PA form.

## 2020-03-15 NOTE — Telephone Encounter (Addendum)
Filled and in Lisa's box.  Anticipate may be denied.  Would also first check with patient prior to submitting - is she still using this? I believe in a mychart message she said it was ineffective to help for sleep.

## 2020-03-17 DIAGNOSIS — G4733 Obstructive sleep apnea (adult) (pediatric): Secondary | ICD-10-CM | POA: Diagnosis not present

## 2020-03-23 ENCOUNTER — Other Ambulatory Visit: Payer: Self-pay | Admitting: Family Medicine

## 2020-04-02 NOTE — Telephone Encounter (Signed)
Recommend OV for new breaking out. plz call pt to schedule appt with available provider tomorrow. If unavailable let me know to add on to my day at 1pm or 4:30pm.

## 2020-04-03 ENCOUNTER — Ambulatory Visit: Payer: Medicare HMO | Admitting: Primary Care

## 2020-04-04 ENCOUNTER — Encounter: Payer: Self-pay | Admitting: Primary Care

## 2020-04-04 ENCOUNTER — Other Ambulatory Visit: Payer: Self-pay

## 2020-04-04 ENCOUNTER — Ambulatory Visit (INDEPENDENT_AMBULATORY_CARE_PROVIDER_SITE_OTHER): Payer: Medicare HMO | Admitting: Primary Care

## 2020-04-04 VITALS — BP 122/68 | HR 68 | Temp 95.8°F | Ht 65.0 in | Wt 210.8 lb

## 2020-04-04 DIAGNOSIS — B029 Zoster without complications: Secondary | ICD-10-CM

## 2020-04-04 MED ORDER — VALACYCLOVIR HCL 1 G PO TABS: 1000.0000 mg | ORAL_TABLET | Freq: Three times a day (TID) | ORAL | 0 refills | Status: DC

## 2020-04-04 NOTE — Assessment & Plan Note (Signed)
Very odd case, does represent herpes zoster on exam, see pictures from visit today.  She does endorse complete resolve of rash in January/February after Valtrex treatment. Unclear why her rash has returned so quickly after recent diagnosis and treatment.  We did discuss the importance of obtaining the Shingrix vaccine once her PCP deems that this is reasonable.  Will re-treat with Valtrex 1000 mg 3 times daily x7 days.  She is using hydrocodone and temazepam sparingly, separately, only if needed.  Discussed use of naproxen and gabapentin.  She will follow up with PCP if no improvement after valacyclovir treatment today

## 2020-04-04 NOTE — Progress Notes (Signed)
Subjective:    Patient ID: Lori Shaw, female    DOB: Dec 13, 1945, 75 y.o.   MRN: [id number]  HPI  Lori Shaw is a very pleasant 75 y.o. female patient of Dr. Danise Mina with a history of seasonal allergic rhinitis, fatty liver disease, CLL, herpes zoster, osteopenia who presents today to discuss herpes zoster.  She has completed Zostavax in 2013, has not completed Shingrix regimen. Diagnosed with herpes zoster on 02/05/2020 per PCP, treated with valacyclovir, gabapentin.    Unfortunately, since then she has struggled with neuropathic pain to the site of her herpes zoster, has been treated with tramadol, Naprosyn, temazepam, hydrocodone.  Today she endorses her pain is improving overall. About five days ago she began to notice a rash to her right lateral trunk extending to the right flank, the same site as her prior herpes zoster rash. The rash has improved since then, less blistering and less erythema but still bothersome with irritation. She is having to wear loose fitting clothing.    Review of Systems  Constitutional: Negative for fever.  Skin: Positive for color change and rash.         Past Medical History:  Diagnosis Date  . B12 deficiency   . Back pain   . CLL (chronic lymphocytic leukemia) ([place]) 06/21/2016   Rai stage 0, dx 06/2016 (Dr Janese Banks)  . HLD (hyperlipidemia)   . Joint pain   . Knee problem   . OSA on CPAP 2006, 04/2014   AHI of 23.8 and an RDI of 42.9, on CPAP 9 cm H2O (D[facility]meier)  . Osteopenia 08/2010   T -1.4 at L femur neck  . Piriformis syndrome 12/14/2014  . Vitamin D deficiency     Social History   Socioeconomic History  . Marital status: Married    Spouse name: Marka Treloar  . Number of children: 2  . Years of education: 1.5  . Highest education level: Not on file  Occupational History  . 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 Smoking status: Former Smoker    Packs/day: 2.00    Years:  20.00    Pack years: 40.00    Types: Cigarettes    Quit date: 01/26/1978    Years since quitting: 42.2  . Smokeless tobacco: Never Used  Vaping Use  . Vaping Use: Never used  Substance and Sexual Activity  . Alcohol use: Yes    Comment: occasionally  . Drug use: Never  . Sexual activity: Yes  Other Topics Concern  . Not on file  Social History Narrative   Lives with daughter, 1 dog   Occupation: retired, has worked in Chief Strategy Officer, also Forensic psychologist   Edu: 1.5 yrs college   Activity: square dancing   Diet: good water, fruits/vegetables daily   Patient drinks two cups of caffeine daily.   Remarried.     Social Determinants of Health   Financial Resource Strain: Low Risk   . Difficulty of Paying Living Expenses: Not hard at all  Food Insecurity: No Food Insecurity  . Worried About Charity fundraiser in the Last Year: Never true  . Ran Out of Food in the Last Year: Never true  Transportation Needs: No Transportation Needs  . Lack of Transportation (Medical): No  . Lack of Transportation (Non-Medical): No  Physical Activity: Inactive  . Days of Exercise per Week: 0 days  . Minutes of Exercise per Session: 0 min  Stress: No Stress Concern  Present  . Feeling of Stress : Not at all  Social Connections: Not on file  Intimate Partner Violence: Not At Risk  . Fear of Current or Ex-Partner: No  . Emotionally Abused: No  . Physically Abused: No  . Sexually Abused: No    Past Surgical History:  Procedure Laterality Date  . ABDOMINAL HYSTERECTOMY  01/27/1983   for menometrorrhagia, one ovary remained  . CHOLECYSTECTOMY N/A 08/01/2018   LAPAROSCOPIC CHOLECYSTECTOMY; for gallstone pancreatitis  Celine Ahr, Anderson Malta, MD)  . COLONOSCOPY  2012   no records available. per patient normal.   . dexa  08/2010   osteopenia  . TONSILLECTOMY  01/26/1973    Family History  Problem Relation Age of Onset  . Hyperlipidemia Mother   . CAD Mother        CABG  . Hypertension Mother   .  Hyperlipidemia Father   . CAD Father        MI  . Sudden death Father   . Arthritis Maternal Grandmother   . Cancer Sister        breast  . Breast cancer Sister 40  . Alzheimer's disease Other   . Heart attack Other   . Diabetes Neg Hx   . Stroke Neg Hx     Allergies  Allergen Reactions  . Sulfa Antibiotics Nausea Only and Nausea And Vomiting    Current Outpatient Medications on File Prior to Visit  Medication Sig Dispense Refill  . Cholecalciferol (VITAMIN D) 125 MCG (5000 UT) CAPS Take 1 capsule by mouth daily. 30 capsule   . Cyanocobalamin (VITAMIN B-12) 2500 MCG SUBL Place 1 tablet (2,500 mcg total) under the tongue daily.  0  . fluticasone (FLONASE) 50 MCG/ACT nasal spray Place 2 sprays into both nostrils daily as needed. 16 g 3  . gabapentin (NEURONTIN) 300 MG capsule Take 2 capsules (600 mg total) by mouth 2 (two) times daily. 120 capsule 1  . Multiple Vitamins-Minerals (PRESERVISION AREDS) TABS Take 2 tablets by mouth daily.    . naproxen (NAPROSYN) 500 MG tablet TAKE ONE TABLET BY MOUTH TWICE A DAY 30 tablet 0  . rosuvastatin (CRESTOR) 10 MG tablet Take 1 tablet (10 mg total) by mouth daily. 90 tablet 3  . temazepam (RESTORIL) 15 MG capsule Take 1 capsule (15 mg total) by mouth at bedtime as needed for sleep. 20 capsule 0   No current facility-administered medications on file prior to visit.    There were no vitals taken for this visit. Objective:   Physical Exam Cardiovascular:     Rate and Rhythm: Normal rate and regular rhythm.  Skin:    General: Skin is warm and dry.     Findings: Rash present.          Comments: Vesicular type rash to right flank and right lateral trunk. Mild erythema.  Neurological:     Mental Status: She is alert.             Assessment & Plan:      This visit occurred during the SARS-CoV-2 public health emergency.  Safety protocols were in place, including screening questions prior to the visit, additional usage of staff  PPE, and extensive cleaning of exam room while observing appropriate contact time as indicated for disinfecting solutions.

## 2020-04-04 NOTE — Patient Instructions (Signed)
Start valacyclovir 1000 mg. Take 1 tablet by mouth three times daily for 7 days.   Please follow up with Dr. Danise Mina if no improvement.  It was a pleasure meeting you!

## 2020-04-06 ENCOUNTER — Other Ambulatory Visit: Payer: Self-pay | Admitting: Family Medicine

## 2020-04-08 NOTE — Telephone Encounter (Signed)
Naproxen Last filled:  03/25/20, #30 Last OV:  02/23/20, R ankle swelling, shingles Next OV:  11/08/20, AWV prt 2

## 2020-04-09 DIAGNOSIS — G4733 Obstructive sleep apnea (adult) (pediatric): Secondary | ICD-10-CM | POA: Diagnosis not present

## 2020-04-14 DIAGNOSIS — G4733 Obstructive sleep apnea (adult) (pediatric): Secondary | ICD-10-CM | POA: Diagnosis not present

## 2020-04-17 ENCOUNTER — Ambulatory Visit: Payer: Medicare HMO | Admitting: Family Medicine

## 2020-04-25 DIAGNOSIS — H353221 Exudative age-related macular degeneration, left eye, with active choroidal neovascularization: Secondary | ICD-10-CM | POA: Diagnosis not present

## 2020-04-25 DIAGNOSIS — H353112 Nonexudative age-related macular degeneration, right eye, intermediate dry stage: Secondary | ICD-10-CM | POA: Diagnosis not present

## 2020-04-25 DIAGNOSIS — D3132 Benign neoplasm of left choroid: Secondary | ICD-10-CM | POA: Diagnosis not present

## 2020-04-25 DIAGNOSIS — D3131 Benign neoplasm of right choroid: Secondary | ICD-10-CM | POA: Diagnosis not present

## 2020-04-25 DIAGNOSIS — H25813 Combined forms of age-related cataract, bilateral: Secondary | ICD-10-CM | POA: Diagnosis not present

## 2020-04-26 ENCOUNTER — Other Ambulatory Visit: Payer: Self-pay | Admitting: Family Medicine

## 2020-04-26 NOTE — Telephone Encounter (Signed)
Naproxen Last filled:  04/12/20, #30 Last OV:  02/23/20, R ankle swelling Next OV:  11/08/20, AWV prt 2

## 2020-05-10 DIAGNOSIS — G4733 Obstructive sleep apnea (adult) (pediatric): Secondary | ICD-10-CM | POA: Diagnosis not present

## 2020-05-13 ENCOUNTER — Other Ambulatory Visit: Payer: Self-pay | Admitting: Family Medicine

## 2020-05-13 NOTE — Telephone Encounter (Signed)
Refill request Naprosyn Last refill 04/26/20 #30 Last office visit 04/04/20 acute

## 2020-05-27 ENCOUNTER — Other Ambulatory Visit: Payer: Self-pay | Admitting: Family Medicine

## 2020-05-27 DIAGNOSIS — Z1231 Encounter for screening mammogram for malignant neoplasm of breast: Secondary | ICD-10-CM

## 2020-06-01 ENCOUNTER — Other Ambulatory Visit: Payer: Self-pay | Admitting: Family Medicine

## 2020-06-01 ENCOUNTER — Ambulatory Visit
Admission: RE | Admit: 2020-06-01 | Discharge: 2020-06-01 | Disposition: A | Discharge status: Home / Self Care | Payer: Medicare HMO | Source: Ambulatory Visit | Attending: Family Medicine | Admitting: Family Medicine

## 2020-06-01 DIAGNOSIS — Z1231 Encounter for screening mammogram for malignant neoplasm of breast: Secondary | ICD-10-CM | POA: Diagnosis not present

## 2020-06-03 NOTE — Telephone Encounter (Signed)
Refill request Naproxen Last refill 05/15/20 #30 Last office visit 04/04/20 acute visit

## 2020-06-09 DIAGNOSIS — G4733 Obstructive sleep apnea (adult) (pediatric): Secondary | ICD-10-CM | POA: Diagnosis not present

## 2020-06-25 ENCOUNTER — Other Ambulatory Visit: Payer: Self-pay | Admitting: Family Medicine

## 2020-06-27 DIAGNOSIS — D3132 Benign neoplasm of left choroid: Secondary | ICD-10-CM | POA: Diagnosis not present

## 2020-06-27 DIAGNOSIS — H353221 Exudative age-related macular degeneration, left eye, with active choroidal neovascularization: Secondary | ICD-10-CM | POA: Diagnosis not present

## 2020-06-27 DIAGNOSIS — D3131 Benign neoplasm of right choroid: Secondary | ICD-10-CM | POA: Diagnosis not present

## 2020-06-27 DIAGNOSIS — H353112 Nonexudative age-related macular degeneration, right eye, intermediate dry stage: Secondary | ICD-10-CM | POA: Diagnosis not present

## 2020-06-27 DIAGNOSIS — H25813 Combined forms of age-related cataract, bilateral: Secondary | ICD-10-CM | POA: Diagnosis not present

## 2020-07-03 DIAGNOSIS — G4733 Obstructive sleep apnea (adult) (pediatric): Secondary | ICD-10-CM | POA: Diagnosis not present

## 2020-07-09 ENCOUNTER — Inpatient Hospital Stay: Payer: Medicare HMO | Attending: Oncology

## 2020-07-09 DIAGNOSIS — C9110 Chronic lymphocytic leukemia of B-cell type not having achieved remission: Secondary | ICD-10-CM | POA: Diagnosis not present

## 2020-07-09 LAB — CBC WITH DIFFERENTIAL/PLATELET
Abs Immature Granulocytes: 0 10*3/uL (ref 0.00–0.07)
Band Neutrophils: 1 %
Basophils Absolute: 0.3 10*3/uL — ABNORMAL HIGH (ref 0.0–0.1)
Basophils Relative: 1 %
Eosinophils Absolute: 1.3 10*3/uL — ABNORMAL HIGH (ref 0.0–0.5)
Eosinophils Relative: 5 %
HCT: 44.1 % (ref 36.0–46.0)
Hemoglobin: 14.4 g/dL (ref 12.0–15.0)
Lymphocytes Relative: 67 %
Lymphs Abs: 17 10*3/uL — ABNORMAL HIGH (ref 0.7–4.0)
MCH: 30.2 pg (ref 26.0–34.0)
MCHC: 32.7 g/dL (ref 30.0–36.0)
MCV: 92.5 fL (ref 80.0–100.0)
Monocytes Absolute: 1.3 10*3/uL — ABNORMAL HIGH (ref 0.1–1.0)
Monocytes Relative: 5 %
Neutro Abs: 5.6 10*3/uL (ref 1.7–7.7)
Neutrophils Relative %: 21 %
Platelets: 152 10*3/uL (ref 150–400)
RBC: 4.77 MIL/uL (ref 3.87–5.11)
RDW: 14.5 % (ref 11.5–15.5)
Smear Review: NORMAL
WBC Morphology: ABNORMAL
WBC: 25.3 10*3/uL — ABNORMAL HIGH (ref 4.0–10.5)
nRBC: 0 % (ref 0.0–0.2)

## 2020-07-10 DIAGNOSIS — G4733 Obstructive sleep apnea (adult) (pediatric): Secondary | ICD-10-CM | POA: Diagnosis not present

## 2020-07-12 ENCOUNTER — Other Ambulatory Visit: Payer: Self-pay | Admitting: Family Medicine

## 2020-07-12 NOTE — Telephone Encounter (Signed)
ERx  However plz call pt - is she regularly taking every day BID? This should be PRN medication. Ensure no leg swelling with regular NSAID use as that is a possible side effect.

## 2020-07-12 NOTE — Telephone Encounter (Signed)
Naproxen Last filled:  06/26/20, #30 Last OV:  02/23/20, ankle swelling Next OV:  11/08/20, AWV prt 2

## 2020-07-17 NOTE — Telephone Encounter (Signed)
Spoke with pt asking about frequency of naproxen.  States she is taking med everyday BID.  Says she still has pain from shingles.  Pt denies any leg swelling.  She's asking is there something better for her to take.  Plz advise.

## 2020-07-20 NOTE — Telephone Encounter (Signed)
This is for reccurrent shingles diagnosed 03/2020 Ok to continue naprosyn 500mg  bid If ongoing need, would recommend trial lowe dose naprosyn 375mg  bid - let us know how she's doing.

## 2020-07-22 NOTE — Telephone Encounter (Addendum)
Pt returning call.  I relayed Dr. Synthia Innocent message.  Pt verbalizes understanding and agrees to whatever Dr. Darnell Level recommends.  States she only takes 1 tab PRN.

## 2020-07-22 NOTE — Telephone Encounter (Signed)
Lvm asking pt to call back.  Need to relay Dr. Synthia Innocent message and get update on pt.

## 2020-08-02 DIAGNOSIS — G4733 Obstructive sleep apnea (adult) (pediatric): Secondary | ICD-10-CM | POA: Diagnosis not present

## 2020-09-02 DIAGNOSIS — G4733 Obstructive sleep apnea (adult) (pediatric): Secondary | ICD-10-CM | POA: Diagnosis not present

## 2020-09-27 DIAGNOSIS — H524 Presbyopia: Secondary | ICD-10-CM | POA: Diagnosis not present

## 2020-09-27 DIAGNOSIS — Z0100 Encounter for examination of eyes and vision without abnormal findings: Secondary | ICD-10-CM | POA: Diagnosis not present

## 2020-09-27 DIAGNOSIS — H2513 Age-related nuclear cataract, bilateral: Secondary | ICD-10-CM | POA: Diagnosis not present

## 2020-10-04 ENCOUNTER — Telehealth: Payer: Self-pay | Admitting: Family Medicine

## 2020-10-04 NOTE — Telephone Encounter (Signed)
LVM for pt to rtn my call to r/s appt with nha. 

## 2020-10-10 DIAGNOSIS — H353221 Exudative age-related macular degeneration, left eye, with active choroidal neovascularization: Secondary | ICD-10-CM | POA: Diagnosis not present

## 2020-10-10 DIAGNOSIS — H353112 Nonexudative age-related macular degeneration, right eye, intermediate dry stage: Secondary | ICD-10-CM | POA: Diagnosis not present

## 2020-10-10 DIAGNOSIS — D3132 Benign neoplasm of left choroid: Secondary | ICD-10-CM | POA: Diagnosis not present

## 2020-10-10 DIAGNOSIS — H25813 Combined forms of age-related cataract, bilateral: Secondary | ICD-10-CM | POA: Diagnosis not present

## 2020-10-10 DIAGNOSIS — D3131 Benign neoplasm of right choroid: Secondary | ICD-10-CM | POA: Diagnosis not present

## 2020-11-05 ENCOUNTER — Ambulatory Visit: Payer: Medicare HMO

## 2020-11-06 ENCOUNTER — Other Ambulatory Visit: Payer: Medicare HMO

## 2020-11-07 ENCOUNTER — Ambulatory Visit: Payer: Medicare HMO | Admitting: Adult Health

## 2020-11-08 ENCOUNTER — Ambulatory Visit: Payer: Medicare HMO | Admitting: Family Medicine

## 2020-11-14 ENCOUNTER — Encounter: Payer: Self-pay | Admitting: General Surgery

## 2020-11-21 ENCOUNTER — Ambulatory Visit: Payer: Medicare HMO | Admitting: Adult Health

## 2020-11-25 DIAGNOSIS — G4733 Obstructive sleep apnea (adult) (pediatric): Secondary | ICD-10-CM | POA: Diagnosis not present

## 2021-01-09 ENCOUNTER — Other Ambulatory Visit: Payer: Self-pay

## 2021-01-09 ENCOUNTER — Encounter: Payer: Self-pay | Admitting: Nurse Practitioner

## 2021-01-09 ENCOUNTER — Inpatient Hospital Stay: Payer: Medicare HMO | Attending: Oncology

## 2021-01-09 ENCOUNTER — Inpatient Hospital Stay (HOSPITAL_BASED_OUTPATIENT_CLINIC_OR_DEPARTMENT_OTHER): Payer: Medicare HMO | Admitting: Nurse Practitioner

## 2021-01-09 VITALS — BP 116/57 | HR 60 | Temp 97.7°F | Resp 20 | Wt 194.6 lb

## 2021-01-09 DIAGNOSIS — C9110 Chronic lymphocytic leukemia of B-cell type not having achieved remission: Secondary | ICD-10-CM | POA: Diagnosis not present

## 2021-01-09 LAB — CBC WITH DIFFERENTIAL/PLATELET
Abs Immature Granulocytes: 0.04 10*3/uL (ref 0.00–0.07)
Basophils Absolute: 0.2 10*3/uL — ABNORMAL HIGH (ref 0.0–0.1)
Basophils Relative: 1 %
Eosinophils Absolute: 0.3 10*3/uL (ref 0.0–0.5)
Eosinophils Relative: 1 %
HCT: 45.5 % (ref 36.0–46.0)
Hemoglobin: 15.1 g/dL — ABNORMAL HIGH (ref 12.0–15.0)
Immature Granulocytes: 0 %
Lymphocytes Relative: 72 %
Lymphs Abs: 18 10*3/uL — ABNORMAL HIGH (ref 0.7–4.0)
MCH: 30.8 pg (ref 26.0–34.0)
MCHC: 33.2 g/dL (ref 30.0–36.0)
MCV: 92.9 fL (ref 80.0–100.0)
Monocytes Absolute: 2.7 10*3/uL — ABNORMAL HIGH (ref 0.1–1.0)
Monocytes Relative: 11 %
Neutro Abs: 3.8 10*3/uL (ref 1.7–7.7)
Neutrophils Relative %: 15 %
Platelets: 193 10*3/uL (ref 150–400)
RBC: 4.9 MIL/uL (ref 3.87–5.11)
RDW: 14.6 % (ref 11.5–15.5)
Smear Review: NORMAL
WBC: 25.1 10*3/uL — ABNORMAL HIGH (ref 4.0–10.5)
nRBC: 0 % (ref 0.0–0.2)

## 2021-01-09 NOTE — Progress Notes (Signed)
Hematology/Oncology Consult Note [facility]  [phone] [phone]  Patient Care Team: Ria Bush, MD as PCP - General (Family Medicine) Solon Augusta, MD as Attending Physician (Ophthalmology) Elbert Ewings, DDS as Consulting Physician (Dentistry) Sindy Guadeloupe, MD as Consulting Physician (Hematology and Oncology)   Name of the patient: Lori Shaw  [id number]  Nov 09, 1945   Date of visit: 01/09/21  Diagnosis-Rai stage 0 CLL  Chief complaint/ Reason for visit-routine follow-up of CLL  Heme/Onc history:  patient is a 75 year old female with no significant past medical history other than low B12 and low vitamin D levels. Patient was recently noted to have leukocytosis with a white count of 13. Differential mainly showed lymphocytosis. Recent CBC from 07/03/2016 showed white count of 13, H&H of 14.7/44 and a platelet count of 207. Differential showed 63.5% lymphocytes. Peripheral flow cytometry showed CD5 positive, CD23 positive clonal B-cell population CLL/SLL phenotype.     Interval history-   patient reports doing well overall.  No recent infections or hospitalizations.  Appetite and weight have remained stable.  She has not received Covid vaccination.  ECOG PS- 1 Pain scale- 0   Review of systems- Review of Systems  Constitutional:  Positive for malaise/fatigue. Negative for chills, fever and weight loss.  HENT:  Negative for congestion, ear discharge and nosebleeds.   Eyes:  Negative for blurred vision.  Respiratory:  Negative for cough, hemoptysis, sputum production, shortness of breath and wheezing.   Cardiovascular:  Negative for chest pain, palpitations, orthopnea and claudication.  Gastrointestinal:  Negative for abdominal pain, blood in stool, constipation, diarrhea, heartburn, melena, nausea and vomiting.  Genitourinary:  Negative for dysuria, flank pain, frequency, hematuria and urgency.  Musculoskeletal:   Negative for back pain, joint pain and myalgias.  Skin:  Negative for rash.  Neurological:  Negative for dizziness, tingling, focal weakness, seizures, weakness and headaches.  Endo/Heme/Allergies:  Does not bruise/bleed easily.  Psychiatric/Behavioral:  Negative for depression and suicidal ideas. The patient does not have insomnia.      Allergies  Allergen Reactions   Sulfa Antibiotics Nausea Only and Nausea And Vomiting     Past Medical History:  Diagnosis Date   B12 deficiency    Back pain    CLL (chronic lymphocytic leukemia) ([place]) 06/21/2016   Rai stage 0, dx 06/2016 (Dr Janese Banks)   HLD (hyperlipidemia)    Joint pain    Knee problem    OSA on CPAP 2006, 04/2014   AHI of 23.8 and an RDI of 42.9, on CPAP 9 cm H2O (Dohmeier)   Osteopenia 08/2010   T -1.4 at L femur neck   Piriformis syndrome 12/14/2014   Vitamin D deficiency      Past Surgical History:  Procedure Laterality Date   ABDOMINAL HYSTERECTOMY  01/27/1983   for menometrorrhagia, one ovary remained   CHOLECYSTECTOMY N/A 08/01/2018   LAPAROSCOPIC CHOLECYSTECTOMY; for gallstone pancreatitis  Celine Ahr, Anderson Malta, MD)   COLONOSCOPY  2012   no records available. per patient normal.    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, fitness trainer  Tobacco Use   Smoking status: Former    Packs/day: 2.00    Years:  20.00    Pack years: 40.00    Types: Cigarettes    Quit date: 01/26/1978    Years since quitting: 42.9   Smokeless tobacco: Never  Vaping Use   Vaping Use: Never used  Substance and Sexual Activity   Alcohol use: Yes    Comment: occasionally   Drug use: Never   Sexual activity: Yes  Other Topics Concern   Not on file  Social History Narrative    Lives with daughter, 1 dog   Occupation: retired, has worked in Chief Strategy Officer, also Forensic psychologist   Edu: 1.5 yrs college   Activity: square dancing   Diet: good water, fruits/vegetables daily   Patient drinks two cups of caffeine daily.   Remarried.     Social Determinants of Health   Financial Resource Strain: Not on file  Food Insecurity: Not on file  Transportation Needs: Not on file  Physical Activity: Not on file  Stress: Not on file  Social Connections: Not on file  Intimate Partner Violence: Not on file    Family History  Problem Relation Age of Onset   Hyperlipidemia Mother    CAD Mother        CABG   Hypertension Mother    Hyperlipidemia Father    CAD Father        MI   Sudden death Father    Arthritis Maternal Grandmother    Cancer Sister        breast   Breast cancer Sister 48   Alzheimer's disease Other    Heart attack Other    Diabetes Neg Hx    Stroke Neg Hx      Current Outpatient Medications:    Cholecalciferol (VITAMIN D) 125 MCG (5000 UT) CAPS, Take 1 capsule by mouth daily., Disp: 30 capsule, Rfl:    Cyanocobalamin (VITAMIN B-12) 2500 MCG SUBL, Place 1 tablet (2,500 mcg total) under the tongue daily., Disp: , Rfl: 0   fluticasone (FLONASE) 50 MCG/ACT nasal spray, Place 2 sprays into both nostrils daily as needed., Disp: 16 g, Rfl: 3   rosuvastatin (CRESTOR) 10 MG tablet, Take 1 tablet (10 mg total) by mouth daily., Disp: 90 tablet, Rfl: 3   gabapentin (NEURONTIN) 300 MG capsule, Take 2 capsules (600 mg total) by mouth 2 (two) times daily. (Patient not taking: Reported on 01/09/2021), Disp: 120 capsule, Rfl: 1   Multiple Vitamins-Minerals (PRESERVISION AREDS) TABS, Take 2 tablets by mouth daily. (Patient not taking: Reported on 01/09/2021), Disp: , Rfl:    naproxen (NAPROSYN) 500 MG tablet, TAKE ONE TABLET BY MOUTH TWICE A DAY, Disp: 30 tablet, Rfl: 0   temazepam (RESTORIL) 15 MG capsule, Take 1 capsule (15 mg total) by mouth at bedtime as  needed for sleep., Disp: 20 capsule, Rfl: 0   valACYclovir (VALTREX) 1000 MG tablet, Take 1 tablet (1,000 mg total) by mouth 3 (three) times daily., Disp: 21 tablet, Rfl: 0  Physical exam:  Vitals:   01/09/21 1003  BP: (!) 116/57  Pulse: 60  Resp: 20  Temp: 97.7 F (36.5 C)  SpO2: 99%  Weight: 194 lb 9.6 oz (88.3 kg)   Physical Exam Cardiovascular:     Rate and Rhythm: Normal rate and regular rhythm.     Heart sounds: Normal heart sounds.  Pulmonary:     Effort: Pulmonary effort is normal.     Breath sounds: Normal breath sounds.  Abdominal:     General: Bowel sounds are normal.     Palpations:  Abdomen is soft.     Comments: No palpable hepatosplenomegaly  Lymphadenopathy:     Comments: No palpable cervical, supraclavicular, axillary or inguinal adenopathy   Skin:    General: Skin is warm and dry.  Neurological:     Mental Status: She is alert and oriented to person, place, and time.     CMP Latest Ref Rng & Units 02/02/2020  Glucose 65 - 99 mg/dL 101(H)  BUN 8 - 27 mg/dL 11  Creatinine 0.57 - 1.00 mg/dL 0.71  Sodium 134 - 144 mmol/L 142  Potassium 3.5 - 5.2 mmol/L 4.3  Chloride 96 - 106 mmol/L 106  CO2 20 - 29 mmol/L 26  Calcium 8.7 - 10.3 mg/dL 9.9  Total Protein 6.0 - 8.5 g/dL 6.2  Total Bilirubin 0.0 - 1.2 mg/dL 0.3  Alkaline Phos 44 - 121 IU/L 105  AST 0 - 40 IU/L 16  ALT 0 - 32 IU/L 20   CBC Latest Ref Rng & Units 01/09/2021  WBC 4.0 - 10.5 K/uL 25.1(H)  Hemoglobin 12.0 - 15.0 g/dL 15.1(H)  Hematocrit 36.0 - 46.0 % 45.5  Platelets 150 - 400 K/uL 193     Assessment and plan- Patient is a 75 y.o. female with Rai stage 0 CLL here for routine follow-up  Clinically stable without concerning B symptoms or palpable adenopathy on exam today. Her white count is stable at 25.1. Elevated lymphocyte count consistent with CLL but absence of other cytopenias. Recommend continue to monitor her counts and observation. We reviewed B symptoms and if she has these she will  rtc sooner for lab check.   Repeat CBC with differential in 6 months in 1 year and I will see her back in 1 year   Visit Diagnosis 1. CLL (chronic lymphocytic leukemia) ([place])    Beckey Rutter, DNP, AGNP-C [place] at [facility] [phone] (clinic) 01/09/2021

## 2021-01-12 ENCOUNTER — Encounter: Payer: Self-pay | Admitting: Neurology

## 2021-01-13 ENCOUNTER — Encounter: Payer: Self-pay | Admitting: Neurology

## 2021-01-13 ENCOUNTER — Ambulatory Visit: Payer: Medicare HMO | Admitting: Neurology

## 2021-01-13 VITALS — BP 119/77 | HR 75 | Ht 65.0 in | Wt 193.5 lb

## 2021-01-13 DIAGNOSIS — Z9989 Dependence on other enabling machines and devices: Secondary | ICD-10-CM | POA: Diagnosis not present

## 2021-01-13 DIAGNOSIS — G4710 Hypersomnia, unspecified: Secondary | ICD-10-CM | POA: Diagnosis not present

## 2021-01-13 DIAGNOSIS — R519 Headache, unspecified: Secondary | ICD-10-CM

## 2021-01-13 DIAGNOSIS — E669 Obesity, unspecified: Secondary | ICD-10-CM | POA: Diagnosis not present

## 2021-01-13 DIAGNOSIS — G4733 Obstructive sleep apnea (adult) (pediatric): Secondary | ICD-10-CM

## 2021-01-13 NOTE — Progress Notes (Signed)
PATIENT: Lori Shaw DOB: 07/20/1945  REASON FOR VISIT: follow up HISTORY FROM: patient  HISTORY OF PRESENT ILLNESS: Rv on 01-13-2021; Lori Shaw is a meanwhile 75 year -old caucasian married female and on CPAP since 2016, her husband ,Lori Shaw,  is on dialysis, and she has a lot of concerns.  She confirms today that what is Lori looks great and everybody comments on that but she does remain very concerned about his health.  His peritoneal dialysis treatments also interrupt her sleep to some degree.  The patient has been doing well she has a history of chronic lymphocytic leukemia and was in the past sometimes very fatigued and daytime sleepy she also had a history of sleep related headaches.  With the diagnosis of obstructive sleep apnea and treatment of CPAP beginning in 2016 though symptoms have much improved and she considers herself a CPAP dependent patient.  We will order a home sleep test for this patient just to confirm that she still has apnea and of what kind and if hypoxemia is associated or not.  She definitely will need an order for a Lori CPAP soon as I do not want her to go without.  Her machine is still working but is 74 years old and the software is no longer updated.  01/13/21:  Lori Shaw is a 75 year old female with a history of obstructive sleep apnea on CPAP.  Her download indicates that she use her machine nightly for compliance of 100%.  She use her machine greater than 4 hours each night.  On average she uses her machine 8 hours and 21 minutes.  Her residual AHI is 1.5 on 9 cm of water with EPR 2.  She does not have a significant leak.  She reports that the CPAP is working well for her.  She returns today for an evaluation.  HISTORY 11/08/18:   Lori Shaw is a 75 year old female with a history of obstructive sleep apnea on CPAP.  Her download indicates that she used her machine 30 out of 30 days for compliance of 100%.  She use her machine greater than 4 hours  each night.  On average she uses her machine 8 hours and 54 minutes.  Her residual AHI is 1.2 on 9 cm of water with EPR 2.  She reports that the CPAP is working well for her.  She states that she has noticed that she has been having a hard time sleeping.  She does not think it is anything to do with the CPAP.  She feels that it is related to environmental changes specifically Covid.  She was wondering if she could try melatonin.  REVIEW OF SYSTEMS: Out of a complete 14 system review of symptoms, the patient complains only of the following symptoms, and all other reviewed systems are negative.   ESS 4  ALLERGIES: Allergies  Allergen Reactions   Sulfa Antibiotics Nausea Only and Nausea And Vomiting    HOME MEDICATIONS: Outpatient Medications Prior to Visit  Medication Sig Dispense Refill   Cholecalciferol (VITAMIN D) 125 MCG (5000 UT) CAPS Take 1 capsule by mouth daily. 30 capsule    Cyanocobalamin (VITAMIN B-12) 2500 MCG SUBL Place 1 tablet (2,500 mcg total) under the tongue daily.  0   fluticasone (FLONASE) 50 MCG/ACT nasal spray Place 2 sprays into both nostrils daily as needed. 16 g 3   Multiple Vitamins-Minerals (PRESERVISION AREDS) TABS Take 2 tablets by mouth daily.     rosuvastatin (CRESTOR) 10 MG tablet Take  1 tablet (10 mg total) by mouth daily. 90 tablet 3   gabapentin (NEURONTIN) 300 MG capsule Take 2 capsules (600 mg total) by mouth 2 (two) times daily. (Patient not taking: Reported on 01/09/2021) 120 capsule 1   naproxen (NAPROSYN) 500 MG tablet TAKE ONE TABLET BY MOUTH TWICE A DAY 30 tablet 0   temazepam (RESTORIL) 15 MG capsule Take 1 capsule (15 mg total) by mouth at bedtime as needed for sleep. 20 capsule 0   valACYclovir (VALTREX) 1000 MG tablet Take 1 tablet (1,000 mg total) by mouth 3 (three) times daily. 21 tablet 0   No facility-administered medications prior to visit.    PAST MEDICAL HISTORY: Past Medical History:  Diagnosis Date   B12 deficiency    Back pain     CLL (chronic lymphocytic leukemia) ([place]) 06/21/2016   Rai stage 0, dx 06/2016 (Dr Janese Banks)   HLD (hyperlipidemia)    Joint pain    Knee problem    Macular degeneration of both eyes    OSA on CPAP 2006, 04/2014   AHI of 23.8 and an RDI of 42.9, on CPAP 9 cm H2O (Mostyn Varnell)   Osteopenia 08/2010   T -1.4 at L femur neck   Piriformis syndrome 12/14/2014   Vitamin D deficiency     PAST SURGICAL HISTORY: Past Surgical History:  Procedure Laterality Date   ABDOMINAL HYSTERECTOMY  01/27/1983   for menometrorrhagia, one ovary remained   CHOLECYSTECTOMY N/A 08/01/2018   LAPAROSCOPIC CHOLECYSTECTOMY; for gallstone pancreatitis  Celine Ahr, Anderson Malta, MD)   COLONOSCOPY  2012   no records available. per patient normal.    dexa  08/2010   osteopenia   TONSILLECTOMY  01/26/1973    FAMILY HISTORY: Family History  Problem Relation Age of Onset   Hyperlipidemia Mother    CAD Mother        CABG   Hypertension Mother    Hyperlipidemia Father    CAD Father        MI   Sudden death Father    Arthritis Maternal Grandmother    Cancer Sister        breast   Breast cancer Sister 34   Alzheimer's disease Other    Heart attack Other    Diabetes Neg Hx    Stroke Neg Hx     SOCIAL HISTORY: Social History   Socioeconomic History   Marital status: Married    Spouse name: Forensic psychologist   Number of children: 2   Years of education: 1.5   Highest education level: Not on file  Occupational History   Occupation: retired, 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, fitness trainer  Tobacco Use   Smoking status: Former    Packs/day: 2.00    Years: 20.00    Pack years: 40.00    Types: Cigarettes    Quit date: 01/26/1978    Years since quitting: 42.9   Smokeless tobacco: Never  Vaping Use   Vaping Use: Never used  Substance and Sexual Activity   Alcohol use: Yes    Comment: occasionally   Drug use: Never   Sexual activity: Yes  Other Topics Concern   Not on file  Social History Narrative   Lives with daughter, 1  dog   Occupation: retired, has worked in Chief Strategy Officer, also Forensic psychologist   Edu: 1.5 yrs college   Activity: square dancing   Diet: good water, fruits/vegetables daily   Patient drinks two cups of caffeine daily.   Remarried.     Social  Determinants of Health   Financial Resource Strain: Not on file  Food Insecurity: Not on file  Transportation Needs: Not on file  Physical Activity: Not on file  Stress: Not on file  Social Connections: Not on file  Intimate Partner Violence: Not on file      PHYSICAL EXAM  Vitals:   01/13/21 1456  BP: 119/77  Pulse: 75  Weight: 193 lb 8 oz (87.8 kg)  Height: 5\' 5"  (1.651 m)   Body mass index is 32.2 kg/m.  Generalized: Well developed, in no acute distress  Chest: Lungs clear to auscultation bilaterally  Neurological examination  Mentation: Alert oriented to time, place, history taking. Follows all commands speech and language fluent Cranial nerve ;no hearing loss,  los of vision related to macular degeneration, wet. On the left, dry form of macular degeneration on the right.  Extraocular movements were full, v. Head turning and shoulder shrug  were normal and symmetric. Motor: The motor testing reveals 5 over 5 strength of all 4 extremities. Good symmetric motor tone is noted throughout.  Sensory: no numbness or tingling. Sensory testing is intact to soft touch on all 4 extremities. No evidence of extinction is noted.  Gait and station: Gait is intact.    DIAGNOSTIC DATA (LABS, IMAGING, TESTING) - I reviewed patient records, labs, notes, testing and imaging myself where available.  CPAP download 100% compliant. 01-13-2021  Had shingles in January through March 2022- postherpetic neuralgia-   How likely are you to doze in the following situations: 0 = not likely, 1 = slight chance, 2 = moderate chance, 3 = high chance  Sitting and Reading? Watching Television? Sitting inactive in a public place (theater or meeting)? Lying  down in the afternoon when circumstances permit? Sitting and talking to someone? Sitting quietly after lunch without alcohol? In a car, while stopped for a few minutes in traffic? As a passenger in a car for an hour without a break?  Total = 6   G depression score : 2/ 15 points.  FSS: 21/ 63 points.   Many medical changes since last diagnosis with OSA, ple [name] now considered CPAP dependent.  Needing HST to get Lori baseline and see if she has hypoxia at baseline. Wakes up with morning headaches, after 7-8 hours of sleep.   Lab Results  Component Value Date   WBC 25.1 (H) 01/09/2021   HGB 15.1 (H) 01/09/2021   HCT 45.5 01/09/2021   MCV 92.9 01/09/2021   PLT 193 01/09/2021      Component Value Date/Time   NA 142 02/02/2020 1448   K 4.3 02/02/2020 1448   CL 106 02/02/2020 1448   CO2 26 02/02/2020 1448   GLUCOSE 101 (H) 02/02/2020 1448   GLUCOSE 108 (H) 01/09/2020 0956   BUN 11 02/02/2020 1448   CREATININE 0.71 02/02/2020 1448   CALCIUM 9.9 02/02/2020 1448   PROT 6.2 02/02/2020 1448   ALBUMIN 4.6 02/02/2020 1448   AST 16 02/02/2020 1448   AST 18 04/10/2010 0000   ALT 20 02/02/2020 1448   ALKPHOS 105 02/02/2020 1448   ALKPHOS 79 04/10/2010 0000   BILITOT 0.3 02/02/2020 1448   GFRNONAA 85 02/02/2020 1448   GFRNONAA >60 01/09/2020 0956   GFRAA 98 02/02/2020 1448   Lab Results  Component Value Date   CHOL 145 10/24/2019   HDL 45.20 10/24/2019   LDLCALC 153 (H) 07/15/2018   LDLDIRECT 71.0 10/24/2019   TRIG 210.0 (H) 10/24/2019   CHOLHDL 3 10/24/2019  Lab Results  Component Value Date   HGBA1C 5.4 03/17/2018   Lab Results  Component Value Date   VITAMINB12 248 10/24/2019   Lab Results  Component Value Date   TSH 2.84 03/29/2019      ASSESSMENT AND PLAN 75 y.o. year old female  has a past medical history of B12 deficiency, Back pain, CLL (chronic lymphocytic leukemia) ([place]) (06/21/2016), HLD (hyperlipidemia), Joint pain, Knee problem, Macular degeneration  of both eyes, OSA on CPAP (2006, 04/2014), Osteopenia (08/2010), Piriformis syndrome (12/14/2014), and Vitamin D deficiency. here with:  This machine dated from 2016 and is due for replacement. I will order a HST to confirm that we still deal with apnea, OSA and not hypoxemia.   OSA on CPAP Sleep is interrupted by husband Lori Shaw treatment ( peritoneal dialysis)  - CPAP compliance excellent- 100%  - Good treatment of AHI , residual AHI AHI 0.9/h.  - CPAP dependence - - F/U in months with Lori CPAP machine , must bring the machine, cables and mask.   3. CLL patient with transient Postherpetic neuralgia right thoracic, Th 12,  shingles in Jan-March, but now off all narcotics since March .    I spent 25 minutes of face-to-face and non-face-to-face time with patient.  This included previsit chart review, lab review, study review, order entry, electronic health record documentation, patient education.  Larey Seat, MD   01/13/2021, 3:16 PM [facility] [address], [place], [place] [address] [phone]

## 2021-01-23 DIAGNOSIS — C9510 Chronic leukemia of unspecified cell type not having achieved remission: Secondary | ICD-10-CM | POA: Diagnosis not present

## 2021-01-23 DIAGNOSIS — E785 Hyperlipidemia, unspecified: Secondary | ICD-10-CM | POA: Diagnosis not present

## 2021-01-23 DIAGNOSIS — Z87891 Personal history of nicotine dependence: Secondary | ICD-10-CM | POA: Diagnosis not present

## 2021-01-23 DIAGNOSIS — Z8249 Family history of ischemic heart disease and other diseases of the circulatory system: Secondary | ICD-10-CM | POA: Diagnosis not present

## 2021-01-23 DIAGNOSIS — E669 Obesity, unspecified: Secondary | ICD-10-CM | POA: Diagnosis not present

## 2021-01-23 DIAGNOSIS — J302 Other seasonal allergic rhinitis: Secondary | ICD-10-CM | POA: Diagnosis not present

## 2021-01-23 DIAGNOSIS — G4733 Obstructive sleep apnea (adult) (pediatric): Secondary | ICD-10-CM | POA: Diagnosis not present

## 2021-02-05 ENCOUNTER — Telehealth: Payer: Self-pay | Admitting: Neurology

## 2021-02-05 NOTE — Telephone Encounter (Signed)
LVM for pt to call me back to schedule sleep study  

## 2021-02-06 DIAGNOSIS — D3132 Benign neoplasm of left choroid: Secondary | ICD-10-CM | POA: Diagnosis not present

## 2021-02-06 DIAGNOSIS — H353112 Nonexudative age-related macular degeneration, right eye, intermediate dry stage: Secondary | ICD-10-CM | POA: Diagnosis not present

## 2021-02-06 DIAGNOSIS — D3131 Benign neoplasm of right choroid: Secondary | ICD-10-CM | POA: Diagnosis not present

## 2021-02-06 DIAGNOSIS — H25813 Combined forms of age-related cataract, bilateral: Secondary | ICD-10-CM | POA: Diagnosis not present

## 2021-02-06 DIAGNOSIS — H353221 Exudative age-related macular degeneration, left eye, with active choroidal neovascularization: Secondary | ICD-10-CM | POA: Diagnosis not present

## 2021-02-08 ENCOUNTER — Other Ambulatory Visit: Payer: Self-pay | Admitting: Family Medicine

## 2021-02-08 DIAGNOSIS — J302 Other seasonal allergic rhinitis: Secondary | ICD-10-CM

## 2021-02-17 ENCOUNTER — Ambulatory Visit (INDEPENDENT_AMBULATORY_CARE_PROVIDER_SITE_OTHER): Payer: Medicare HMO | Admitting: Neurology

## 2021-02-17 DIAGNOSIS — G4733 Obstructive sleep apnea (adult) (pediatric): Secondary | ICD-10-CM | POA: Diagnosis not present

## 2021-02-17 DIAGNOSIS — R519 Headache, unspecified: Secondary | ICD-10-CM

## 2021-02-17 DIAGNOSIS — Z9989 Dependence on other enabling machines and devices: Secondary | ICD-10-CM

## 2021-02-17 DIAGNOSIS — E669 Obesity, unspecified: Secondary | ICD-10-CM

## 2021-02-17 DIAGNOSIS — G4710 Hypersomnia, unspecified: Secondary | ICD-10-CM

## 2021-02-18 DIAGNOSIS — G4733 Obstructive sleep apnea (adult) (pediatric): Secondary | ICD-10-CM | POA: Diagnosis not present

## 2021-02-19 NOTE — Progress Notes (Signed)
[facility] TEST REPORT ( by Watch PAT)   STUDY DATE:  02-18-2021     ORDERING CLINICIAN: Larey Seat, MD  REFERRING CLINICIAN:    CLINICAL INFORMATION/HISTORY:  01-13-2021; Lori Shaw is a meanwhile 76 year -old caucasian married female and has been treated for OSA on CPAP since 2016.  Her husband ,Lori Shaw, is on dialysis, and she has had a lot of concerns. His peritoneal dialysis treatments also interrupt her sleep to some degree.   The patient has chronic lymphocytic leukemia and related to that felt  fatigued and daytime sleepy. She also had a history of sleep related headaches.  With the diagnosis of obstructive sleep apnea and treatment of CPAP beginning in 2016 said symptoms have much improved and she considers herself a CPAP dependent patient. Her baseline was AHI 23.8/h , RDI 42/h and CPAP is set at 9 cm water.   We will order a home sleep test for this patient just to confirm that she still has obstructive apnea and if hypoxemia is associated.  She definitely will need an order for a new CPAP     Epworth sleepiness score:6 /24. FSS at 21/ 63 points.    BMI: 32 kg/m   Neck Circumference: 15"   FINDINGS:   Sleep Summary:   Total Recording Time (hours, min): Total recording time amounted to 6 hours 58 minutes of which 5 hours and 15 minutes was a total calculated sleep time.  REM sleep was present and 14.4% of total sleep time.                                 Respiratory Indices:   Calculated pAHI (per hour): The overall AHI was mild at 9.4/h but very much REM sleep accentuated.  REM AHI was 25.2 non-REM AHI 6.5/h.                            Positional AHI: The patient slept mostly in supine position with an AHI of 13.5/h followed by left-sided sleep with an AHI of 4.3/h.  The patient also slept prone for 12 minutes with an AHI of 5.0/h.  Snoring level mean volume was 42 dB and snoring was present for 29% of total sleep time.                                                   Oxygen Saturation Statistics:   O2 Saturation Range (%): Between a nadir of 86% and a maximum saturation of 97% with a mean saturation at 91%.                                      O2 Saturation (minutes) <89%:    31 minutes , the equivalent of 10% of total sleep time. O2 saturation under 90%: 49.4 minutes, equivalent to 15.7% of total sleep time.       Pulse Rate Statistics:        Pulse Range:     Varied between 56 and 94 bpm with a mean heart rate of 70 bpm.  Please note that a home sleep test cannot detect cardiac arrhythmia only the cardiac rate is reflected.  It is also not possible to evaluate restless legs or PLMs with a home sleep test.            IMPRESSION:  This HST confirms the presence of mild apnea with strong REM sleep dependency further accentuated by supine sleep.. There was clinically significant hypoxemia present. Sleep architecture shows highly fragmented sleep.  The patient has previously stated that she cannot sleep through the night without CPAP .    RECOMMENDATION: Further CPAP dependent patient with a chronic leukemia continuation of positive airway pressure therapy is very important.  We never had an indication of hypoxemia before.  I want the patient to continue using CPAP with an auto titration device between 6 and 12 cmH2O pressure was 2 cm EPR and she continues to use the mask of her choice.  The machine will provide humidification for machine and tubing.   I like to follow with a overnight pulse oximetry on the new CPAP device before we meet in a follow-up visit.  The overnight oximetry can take place in the second or third months of therapy under the new CPAP device.    INTERPRETING PHYSICIAN:   Larey Seat, MD   Medical Director of [facility].

## 2021-02-20 DIAGNOSIS — Z9989 Dependence on other enabling machines and devices: Secondary | ICD-10-CM | POA: Insufficient documentation

## 2021-02-20 NOTE — Addendum Note (Signed)
Addended by: Larey Seat on: 02/20/2021 05:54 PM   Modules accepted: Orders

## 2021-02-20 NOTE — Procedures (Signed)
[facility] TEST REPORT ( by Watch PAT)   STUDY DATE:  02-18-2021     ORDERING CLINICIAN: Larey Seat, MD  REFERRING CLINICIAN: Dr. Danise Mina   CLINICAL INFORMATION/HISTORY:  01-13-2021; Lori Shaw is a meanwhile 76 year -old caucasian married female and has been treated for OSA on CPAP since 2016.  Her husband ,Lori Shaw, is on dialysis, and she has had a lot of concerns. His peritoneal dialysis treatments also interrupt her sleep to some degree.   The patient has chronic lymphocytic leukemia and related to that felt  fatigued and daytime sleepy. She also had a history of sleep related headaches.  With the diagnosis of obstructive sleep apnea and treatment of CPAP beginning in 2016 said symptoms have much improved and she considers herself a CPAP dependent patient. Her baseline was AHI 23.8/h , RDI 42/h and CPAP is set at 9 cm water.   We will order a home sleep test for this patient just to confirm that she still has obstructive apnea and if hypoxemia is associated.  She definitely will need an order for a new CPAP     Epworth sleepiness score:6 /24. FSS at 21/ 63 points.    BMI: 32 kg/m   Neck Circumference: 15"   FINDINGS:   Sleep Summary:   Total Recording Time (hours, min): Total recording time amounted to 6 hours 58 minutes of which 5 hours and 15 minutes was a total calculated sleep time.  REM sleep was present and 14.4% of total sleep time.                                 Respiratory Indices:   Calculated pAHI (per hour): The overall AHI was mild at 9.4/h but very much REM sleep accentuated.  REM AHI was 25.2 non-REM AHI 6.5/h.                            Positional AHI: The patient slept mostly in supine position with an AHI of 13.5/h followed by left-sided sleep with an AHI of 4.3/h.  The patient also slept prone for 12 minutes with an AHI of 5.0/h.  Snoring level mean volume was 42 dB and snoring was present for 29% of total sleep time.                                                   Oxygen Saturation Statistics:   O2 Saturation Range (%): Between a nadir of 86% and a maximum saturation of 97% with a mean saturation at 91%.                                      O2 Saturation (minutes) <89%:    31 minutes , the equivalent of 10% of total sleep time. O2 saturation under 90%: 49.4 minutes, equivalent to 15.7% of total sleep time.       Pulse Rate Statistics:        Pulse Range:     Varied between 56 and 94 bpm with a mean heart rate of 70 bpm.  Please note that a home sleep test cannot detect cardiac  arrhythmia only the cardiac rate is reflected.  It is also not possible to evaluate restless legs or PLMs with a home sleep test.            IMPRESSION:  This HST confirms the presence of mild apnea with strong REM sleep dependency further accentuated by supine sleep.. There was clinically significant hypoxemia present. Sleep architecture shows highly fragmented sleep.  The patient has previously stated that she cannot sleep through the night without CPAP .    RECOMMENDATION: Further CPAP dependent patient with a chronic leukemia continuation of positive airway pressure therapy is very important.  We never had an indication of hypoxemia before.  I want the patient to continue using CPAP with an auto titration device between 6 and 12 cmH2O pressure was 2 cm EPR and she continues to use the mask of her choice.  The machine will provide humidification for machine and tubing.   I like to follow with a overnight pulse oximetry on the new CPAP device before we meet in a follow-up visit.  The overnight oximetry can take place in the second or third months of therapy under the new CPAP device.    INTERPRETING PHYSICIAN:   Larey Seat, MD   Medical Director of [facility].

## 2021-02-20 NOTE — Progress Notes (Signed)
IMPRESSION:  This HST confirms the presence of mild apnea with strong REM sleep dependency further accentuated by supine sleep.. There was clinically significant hypoxemia present. Sleep architecture shows highly fragmented sleep.  The patient has previously stated that she cannot sleep through the night without CPAP .   RECOMMENDATION: For a CPAP dependent patient with chronic leukemia the continuation of positive airway pressure therapy is very important.  We never had an indication of hypoxemia before now.   I want the patient to continue using CPAP with an auto -titration device set between 6 and 12 cmH2O pressure with 2 cm EPR while she continues to use the mask of her choice.   The machine will provide humidification for machine and tubing.   I like to follow with a overnight pulse oximetry on the new CPAP device before we meet in a follow-up visit.  The overnight oximetry can take place in the second or third months of therapy under the new CPAP device.

## 2021-02-22 ENCOUNTER — Encounter: Payer: Self-pay | Admitting: Neurology

## 2021-02-24 ENCOUNTER — Telehealth: Payer: Self-pay | Admitting: Neurology

## 2021-02-24 NOTE — Telephone Encounter (Signed)
I called pt. I advised pt that Dr. Brett Fairy reviewed their sleep study results and found that pt still has mild to moderate sleep apnea. Dr. Brett Fairy recommends that pt continues and gets a new auto CPAP. I reviewed PAP compliance expectations with the pt. Pt is agreeable to starting a CPAP. I advised pt that an order will be sent to a DME, [facility], and [facility] will call the pt within about one week after they file with the pt's insurance. [facility] will show the pt how to use the machine, fit for masks, and troubleshoot the CPAP if needed. A follow up appt was made for insurance purposes with Dr. Brett Fairy on 05/22/2021 at 1:30 pm. Pt verbalized understanding to arrive 15 minutes early and bring their CPAP. A letter with all of this information in it will be mailed to the pt as a reminder. I verified with the pt that the address we have on file is correct. Pt verbalized understanding of results. Pt had no questions at this time but was encouraged to call back if questions arise. I have sent the order to [facility] and have received confirmation that they have received the order.

## 2021-02-24 NOTE — Telephone Encounter (Signed)
-----   Message from Larey Seat, MD sent at 02/20/2021  5:54 PM EST ----- IMPRESSION:  This HST confirms the presence of mild apnea with strong REM sleep dependency further accentuated by supine sleep.. There was clinically significant hypoxemia present. Sleep architecture shows highly fragmented sleep.  The patient has previously stated that she cannot sleep through the night without CPAP .   RECOMMENDATION: For a CPAP dependent patient with chronic leukemia the continuation of positive airway pressure therapy is very important.  We never had an indication of hypoxemia before now.   I want the patient to continue using CPAP with an auto -titration device set between 6 and 12 cmH2O pressure with 2 cm EPR while she continues to use the mask of her choice.   The machine will provide humidification for machine and tubing.   I like to follow with a overnight pulse oximetry on the new CPAP device before we meet in a follow-up visit.  The overnight oximetry can take place in the second or third months of therapy under the new CPAP device.

## 2021-03-04 ENCOUNTER — Ambulatory Visit: Payer: Medicare HMO | Admitting: Adult Health

## 2021-03-06 DIAGNOSIS — G4733 Obstructive sleep apnea (adult) (pediatric): Secondary | ICD-10-CM | POA: Diagnosis not present

## 2021-04-03 DIAGNOSIS — G4733 Obstructive sleep apnea (adult) (pediatric): Secondary | ICD-10-CM | POA: Diagnosis not present

## 2021-04-09 ENCOUNTER — Encounter: Payer: Self-pay | Admitting: Neurology

## 2021-04-10 DIAGNOSIS — G4733 Obstructive sleep apnea (adult) (pediatric): Secondary | ICD-10-CM | POA: Diagnosis not present

## 2021-04-14 ENCOUNTER — Encounter: Payer: Self-pay | Admitting: Neurology

## 2021-04-14 ENCOUNTER — Telehealth: Payer: Self-pay | Admitting: Neurology

## 2021-04-14 NOTE — Telephone Encounter (Signed)
Received the overnight oximetry completed on CPAP.  There was a total of 7 hours and 25 minutes recorded.  Out of the entire time patient's oxygen level remained in a normal range.  The lowest the oxygen level dropped was 88%.  Based off of this report there would not be any need to add additional oxygen to the CPAP machine.  Dr. Brett Fairy is aware of previously and had no further recommendations ?

## 2021-05-04 DIAGNOSIS — G4733 Obstructive sleep apnea (adult) (pediatric): Secondary | ICD-10-CM | POA: Diagnosis not present

## 2021-05-22 ENCOUNTER — Ambulatory Visit: Payer: Medicare HMO | Admitting: Neurology

## 2021-05-22 ENCOUNTER — Encounter: Payer: Self-pay | Admitting: Neurology

## 2021-05-22 VITALS — BP 110/78 | HR 65 | Ht 65.0 in | Wt 192.0 lb

## 2021-05-22 DIAGNOSIS — G4733 Obstructive sleep apnea (adult) (pediatric): Secondary | ICD-10-CM

## 2021-05-22 DIAGNOSIS — Z9989 Dependence on other enabling machines and devices: Secondary | ICD-10-CM | POA: Diagnosis not present

## 2021-05-22 NOTE — Progress Notes (Signed)
? ? ?SLEEP MEDICINE CLINIC at [facility]  ? ?PATIENT: Lori Shaw ?DOB: Aug 15, 1945 ? ?REASON FOR VISIT: follow up ?HISTORY FROM: patient ? ?HISTORY OF PRESENT ILLNESS: ? ?Rv 05-22-2021 Lori Shaw is a meanwhile 76 year -old caucasian married female and on CPAP since 2016,  ?A HST was obtained on 02-17-2021.Mild OSA at AHI of 9.6/h. REM AHI 25.2/h. 31 minutes of hypoxia.  Very broken sleep.  ?She started autotitration 6-12 cm water , 2 cm EPR and nasal pillows.  She is 100% compliant user with her new air sense AutoSet which was set up on March 06, 2021.   ? ?To my surprise she was given an AirSense 10 and not an air sense 11.  She is using it 8 hours 25 minutes on average each night.  Her residual AHI is 1.0 which is a very good resolution and the 95th percentile pressure is 9.5 cm water.   ?There are no major air leaks there is no central apnea arising under therapy. ? ?  ? ?Husband [name] is on Peritoneal dialysis.  ? ?Rv on 01-13-2021; Lori Shaw is a meanwhile 76 year -old caucasian married female and on CPAP since 2016, her husband ,[name],  is on dialysis, and she has a lot of concerns.  ?She confirms today that what is new looks great and everybody comments on that but she does remain very concerned about his health.  His peritoneal dialysis treatments also interrupt her sleep to some degree.  The patient has been doing well she has a history of chronic lymphocytic leukemia and was in the past sometimes very fatigued and daytime sleepy she also had a history of sleep related headaches.  With the diagnosis of obstructive sleep apnea and treatment of CPAP beginning in 2016 though symptoms have much improved and she considers herself a CPAP dependent patient.  We will order a home sleep test for this patient just to confirm that she still has apnea and of what kind and if hypoxemia is associated or not.  She definitely will need an order for a new CPAP soon as I do not want her to go without.  Her  machine is still working but is 75 years old and the software is no longer updated. ? ?05/22/21: ? ?Lori Shaw is a 76 year old female with a history of obstructive sleep apnea on CPAP.  Her download indicates that she use her machine nightly for compliance of 100%.  She use her machine greater than 4 hours each night.  On average she uses her machine 8 hours and 21 minutes.  Her residual AHI is 1.5 on 9 cm of water with EPR 2.  She does not have a significant leak.  She reports that the CPAP is working well for her.  She returns today for an evaluation. ? ?HISTORY 11/08/18: ?  ?Lori Shaw is a 76 year old female with a history of obstructive sleep apnea on CPAP.  Her download indicates that she used her machine 30 out of 30 days for compliance of 100%.  She use her machine greater than 4 hours each night.  On average she uses her machine 8 hours and 54 minutes.  Her residual AHI is 1.2 on 9 cm of water with EPR 2.  She reports that the CPAP is working well for her.  She states that she has noticed that she has been having a hard time sleeping.  She does not think it is anything to do with the CPAP.  She feels  that it is related to environmental changes specifically Covid.  She was wondering if she could try melatonin. ? ?REVIEW OF SYSTEMS: Out of a complete 14 system review of symptoms, the patient complains only of the following symptoms, and all other reviewed systems are negative. ? ? ?How likely are you to doze in the following situations: ?0 = not likely, 1 = slight chance, 2 = moderate chance, 3 = high chance ? ?Sitting and Reading? ?Watching Television? ?Sitting inactive in a public place (theater or meeting)? ?Lying down in the afternoon when circumstances permit? ?Sitting and talking to someone? ?Sitting quietly after lunch without alcohol? ?In a car, while stopped for a few minutes in traffic? ?As a passenger in a car for an hour without a break? ? ?Total = 6/24 ? ?ALLERGIES: ?Allergies  ?Allergen  Reactions  ? Sulfa Antibiotics Nausea Only and Nausea And Vomiting  ? ? ?HOME MEDICATIONS: ?Outpatient Medications Prior to Visit  ?Medication Sig Dispense Refill  ? Cholecalciferol (VITAMIN D) 125 MCG (5000 UT) CAPS Take 1 capsule by mouth daily. 30 capsule   ? Cyanocobalamin (VITAMIN B-12) 2500 MCG SUBL Place 1 tablet (2,500 mcg total) under the tongue daily.  0  ? fluticasone (FLONASE) 50 MCG/ACT nasal spray PLACE 2 SPRAYS INTO BOTH NOSTRILS DAILY AS NEEDED 16 mL 0  ? Multiple Vitamins-Minerals (PRESERVISION AREDS) TABS Take 2 tablets by mouth daily.    ? rosuvastatin (CRESTOR) 10 MG tablet Take 1 tablet (10 mg total) by mouth daily. 90 tablet 3  ? ?No facility-administered medications prior to visit.  ? ? ?PAST MEDICAL HISTORY: ?Past Medical History:  ?Diagnosis Date  ? B12 deficiency   ? Back pain   ? CLL (chronic lymphocytic leukemia) ([place]) 06/21/2016  ? Rai stage 0, dx 06/2016 (Dr Janese Banks)  ? HLD (hyperlipidemia)   ? Joint pain   ? Knee problem   ? Macular degeneration of both eyes   ? OSA on CPAP 2006, 04/2014  ? AHI of 23.8 and an RDI of 42.9, on CPAP 9 cm H2O (Kemyah Buser)  ? Osteopenia 08/2010  ? T -1.4 at L femur neck  ? Piriformis syndrome 12/14/2014  ? Vitamin D deficiency   ? ? ?PAST SURGICAL HISTORY: ?Past Surgical History:  ?Procedure Laterality Date  ? ABDOMINAL HYSTERECTOMY  01/27/1983  ? for menometrorrhagia, one ovary remained  ? CHOLECYSTECTOMY N/A 08/01/2018  ? LAPAROSCOPIC CHOLECYSTECTOMY; for gallstone pancreatitis  Celine Ahr, Anderson Malta, MD)  ? COLONOSCOPY  2012  ? no records available. per patient normal.   ? dexa  08/2010  ? osteopenia  ? TONSILLECTOMY  01/26/1973  ? ? ?FAMILY HISTORY: ?Family History  ?Problem Relation Age of Onset  ? Hyperlipidemia Mother   ? CAD Mother   ?     CABG  ? Hypertension Mother   ? Hyperlipidemia Father   ? CAD Father   ?     MI  ? Sudden death Father   ? Arthritis Maternal Grandmother   ? Cancer Sister   ?     breast  ? Breast cancer Sister 44  ? Alzheimer's disease Other    ? Heart attack Other   ? Diabetes Neg Hx   ? Stroke Neg Hx   ? ? ?SOCIAL HISTORY: ?Social History  ? ?Socioeconomic History  ? Marital status: Married  ?  Spouse name: Marden Noble Rodak  ? Number of children: 2  ? Years of education: 1.5  ? Highest education level: Not on file  ?Occupational History  ?  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 Smoking status: Former  ?  Packs/day: 2.00  ?  Years: 20.00  ?  Pack years: 40.00  ?  Types: Cigarettes  ?  Quit date: 01/26/1978  ?  Years since quitting: 43.3  ? Smokeless tobacco: Never  ?Vaping Use  ? Vaping Use: Never used  ?Substance and Sexual Activity  ? Alcohol use: Yes  ?  Comment: occasionally  ? Drug use: Never  ? Sexual activity: Yes  ?Other Topics Concern  ? Not on file  ?Social History Narrative  ? Lives with daughter, 1 dog  ? Occupation: retired, has worked in Chief Strategy Officer, also Forensic psychologist  ? Edu: 1.5 yrs college  ? Activity: square dancing  ? Diet: good water, fruits/vegetables daily  ? Patient drinks two cups of caffeine daily.  ? Remarried.    ? ?Social Determinants of Health  ? ?Financial Resource Strain: Not on file  ?Food Insecurity: Not on file  ?Transportation Needs: Not on file  ?Physical Activity: Not on file  ?Stress: Not on file  ?Social Connections: Not on file  ?Intimate Partner Violence: Not on file  ? ? ? ? ?PHYSICAL EXAM ? ?Vitals:  ? 05/22/21 1312  ?BP: 110/78  ?Pulse: 65  ?SpO2: 99%  ?Weight: 192 lb (87.1 kg)  ?Height: '5\' 5"'$  (1.651 m)  ? ?Body mass index is 31.95 kg/m?. ? ?Generalized: Well developed, in no acute distress , no ankle edema. She lost weight : 190 pounds.  ?Chest: Lungs clear to auscultation bilaterally. ? ?Neurological examination  ?Mentation: Alert oriented to time, place, history taking. Follows all commands speech and language fluent ?Cranial nerve ;no hearing loss,  los of vision related to macular degeneration, wet. On the left, dry form of macular degeneration on the right.  Extraocular  movements were full, v. ?Head turning and shoulder shrug  were normal and symmetric. ?Motor: The motor testing reveals 5 over 5 strength of all 4 extremities. Good symmetric motor tone is noted throughout.  ?Se

## 2021-05-22 NOTE — Patient Instructions (Signed)
Sleep Apnea Sleep apnea is a condition in which breathing pauses or becomes shallow during sleep. People with sleep apnea usually snore loudly. They may have times when they gasp and stop breathing for 10 seconds or more during sleep. This may happen many times during the night. Sleep apnea disrupts your sleep and keeps your body from getting the rest that it needs. This condition can increase your risk of certain health problems, including: Heart attack. Stroke. Obesity. Type 2 diabetes. Heart failure. Irregular heartbeat. High blood pressure. The goal of treatment is to help you breathe normally again. What are the causes?  The most common cause of sleep apnea is a collapsed or blocked airway. There are three kinds of sleep apnea: Obstructive sleep apnea. This kind is caused by a blocked or collapsed airway. Central sleep apnea. This kind happens when the part of the brain that controls breathing does not send the correct signals to the muscles that control breathing. Mixed sleep apnea. This is a combination of obstructive and central sleep apnea. What increases the risk? You are more likely to develop this condition if you: Are overweight. Smoke. Have a smaller than normal airway. Are older. Are female. Drink alcohol. Take sedatives or tranquilizers. Have a family history of sleep apnea. Have a tongue or tonsils that are larger than normal. What are the signs or symptoms? Symptoms of this condition include: Trouble staying asleep. Loud snoring. Morning headaches. Waking up gasping. Dry mouth or sore throat in the morning. Daytime sleepiness and tiredness. If you have daytime fatigue because of sleep apnea, you may be more likely to have: Trouble concentrating. Forgetfulness. Irritability or mood swings. Personality changes. Feelings of depression. Sexual dysfunction. This may include loss of interest if you are female, or erectile dysfunction if you are female. How is this  diagnosed? This condition may be diagnosed with: A medical history. A physical exam. A series of tests that are done while you are sleeping (sleep study). These tests are usually done in a sleep lab, but they may also be done at home. How is this treated? Treatment for this condition aims to restore normal breathing and to ease symptoms during sleep. It may involve managing health issues that can affect breathing, such as high blood pressure or obesity. Treatment may include: Sleeping on your side. Using a decongestant if you have nasal congestion. Avoiding the use of depressants, including alcohol, sedatives, and narcotics. Losing weight if you are overweight. Making changes to your diet. Quitting smoking. Using a device to open your airway while you sleep, such as: An oral appliance. This is a custom-made mouthpiece that shifts your lower jaw forward. A continuous positive airway pressure (CPAP) device. This device blows air through a mask when you breathe out (exhale). A nasal expiratory positive airway pressure (EPAP) device. This device has valves that you put into each nostril. A bi-level positive airway pressure (BIPAP) device. This device blows air through a mask when you breathe in (inhale) and breathe out (exhale). Having surgery if other treatments do not work. During surgery, excess tissue is removed to create a wider airway. Follow these instructions at home: Lifestyle Make any lifestyle changes that your health care provider recommends. Eat a healthy, well-balanced diet. Take steps to lose weight if you are overweight. Avoid using depressants, including alcohol, sedatives, and narcotics. Do not use any products that contain nicotine or tobacco. These products include cigarettes, chewing tobacco, and vaping devices, such as e-cigarettes. If you need help quitting, ask   your health care provider. General instructions Take over-the-counter and prescription medicines only as told  by your health care provider. If you were given a device to open your airway while you sleep, use it only as told by your health care provider. If you are having surgery, make sure to tell your health care provider you have sleep apnea. You may need to bring your device with you. Keep all follow-up visits. This is important. Contact a health care provider if: The device that you received to open your airway during sleep is uncomfortable or does not seem to be working. Your symptoms do not improve. Your symptoms get worse. Get help right away if: You develop: Chest pain. Shortness of breath. Discomfort in your back, arms, or stomach. You have: Trouble speaking. Weakness on one side of your body. Drooping in your face. These symptoms may represent a serious problem that is an emergency. Do not wait to see if the symptoms will go away. Get medical help right away. Call your local emergency services (911 in the [place] Do not drive yourself to the hospital. Summary Sleep apnea is a condition in which breathing pauses or becomes shallow during sleep. The most common cause is a collapsed or blocked airway. The goal of treatment is to restore normal breathing and to ease symptoms during sleep. This information is not intended to replace advice given to you by your health care provider. Make sure you discuss any questions you have with your health care provider. Document Revised: 08/21/2020 Document Reviewed: 12/22/2019 Elsevier Patient Education  2023 Elsevier Inc.  

## 2021-06-03 DIAGNOSIS — G4733 Obstructive sleep apnea (adult) (pediatric): Secondary | ICD-10-CM | POA: Diagnosis not present

## 2021-07-04 DIAGNOSIS — G4733 Obstructive sleep apnea (adult) (pediatric): Secondary | ICD-10-CM | POA: Diagnosis not present

## 2021-07-11 ENCOUNTER — Other Ambulatory Visit: Payer: Self-pay

## 2021-07-11 ENCOUNTER — Encounter: Payer: Self-pay | Admitting: Family Medicine

## 2021-07-11 ENCOUNTER — Inpatient Hospital Stay: Payer: Medicare HMO | Attending: Oncology

## 2021-07-11 DIAGNOSIS — C9110 Chronic lymphocytic leukemia of B-cell type not having achieved remission: Secondary | ICD-10-CM | POA: Diagnosis not present

## 2021-07-11 DIAGNOSIS — G4733 Obstructive sleep apnea (adult) (pediatric): Secondary | ICD-10-CM | POA: Diagnosis not present

## 2021-07-11 LAB — CBC WITH DIFFERENTIAL/PLATELET
Abs Immature Granulocytes: 0.05 10*3/uL (ref 0.00–0.07)
Basophils Absolute: 0.2 10*3/uL — ABNORMAL HIGH (ref 0.0–0.1)
Basophils Relative: 1 %
Eosinophils Absolute: 0.5 10*3/uL (ref 0.0–0.5)
Eosinophils Relative: 2 %
HCT: 48.3 % — ABNORMAL HIGH (ref 36.0–46.0)
Hemoglobin: 16.3 g/dL — ABNORMAL HIGH (ref 12.0–15.0)
Immature Granulocytes: 0 %
Lymphocytes Relative: 67 %
Lymphs Abs: 17.6 10*3/uL — ABNORMAL HIGH (ref 0.7–4.0)
MCH: 31.4 pg (ref 26.0–34.0)
MCHC: 33.7 g/dL (ref 30.0–36.0)
MCV: 93.1 fL (ref 80.0–100.0)
Monocytes Absolute: 3.2 10*3/uL — ABNORMAL HIGH (ref 0.1–1.0)
Monocytes Relative: 12 %
Neutro Abs: 4.6 10*3/uL (ref 1.7–7.7)
Neutrophils Relative %: 18 %
Platelets: 158 10*3/uL (ref 150–400)
RBC: 5.19 MIL/uL — ABNORMAL HIGH (ref 3.87–5.11)
RDW: 14.6 % (ref 11.5–15.5)
Smear Review: NORMAL
WBC: 26 10*3/uL — ABNORMAL HIGH (ref 4.0–10.5)
nRBC: 0 % (ref 0.0–0.2)

## 2021-07-11 LAB — COMPREHENSIVE METABOLIC PANEL
ALT: 21 U/L (ref 0–44)
AST: 23 U/L (ref 15–41)
Albumin: 4.2 g/dL (ref 3.5–5.0)
Alkaline Phosphatase: 69 U/L (ref 38–126)
Anion gap: 10 (ref 5–15)
BUN: 12 mg/dL (ref 8–23)
CO2: 23 mmol/L (ref 22–32)
Calcium: 8.9 mg/dL (ref 8.9–10.3)
Chloride: 107 mmol/L (ref 98–111)
Creatinine, Ser: 0.76 mg/dL (ref 0.44–1.00)
GFR, Estimated: >60 mL/min (ref >60)
Glucose, Bld: 99 mg/dL (ref 70–99)
Potassium: 3.7 mmol/L (ref 3.5–5.1)
Sodium: 140 mmol/L (ref 135–145)
Total Bilirubin: 0.9 mg/dL (ref 0.3–1.2)
Total Protein: 6.7 g/dL (ref 6.5–8.1)

## 2021-08-03 DIAGNOSIS — G4733 Obstructive sleep apnea (adult) (pediatric): Secondary | ICD-10-CM | POA: Diagnosis not present

## 2021-08-07 DIAGNOSIS — D3131 Benign neoplasm of right choroid: Secondary | ICD-10-CM | POA: Diagnosis not present

## 2021-08-07 DIAGNOSIS — H353112 Nonexudative age-related macular degeneration, right eye, intermediate dry stage: Secondary | ICD-10-CM | POA: Diagnosis not present

## 2021-08-07 DIAGNOSIS — D3132 Benign neoplasm of left choroid: Secondary | ICD-10-CM | POA: Diagnosis not present

## 2021-08-07 DIAGNOSIS — H353221 Exudative age-related macular degeneration, left eye, with active choroidal neovascularization: Secondary | ICD-10-CM | POA: Diagnosis not present

## 2021-08-07 DIAGNOSIS — H25813 Combined forms of age-related cataract, bilateral: Secondary | ICD-10-CM | POA: Diagnosis not present

## 2021-08-11 IMAGING — DX DG ANKLE COMPLETE 3+V*R*
3 series · 3 of 3 positions shown · non-contrast
Comparison: None.

CLINICAL DATA: Medial ankle pain.

EXAM:
RIGHT ANKLE - COMPLETE 3+ VIEW

[ankle ap]
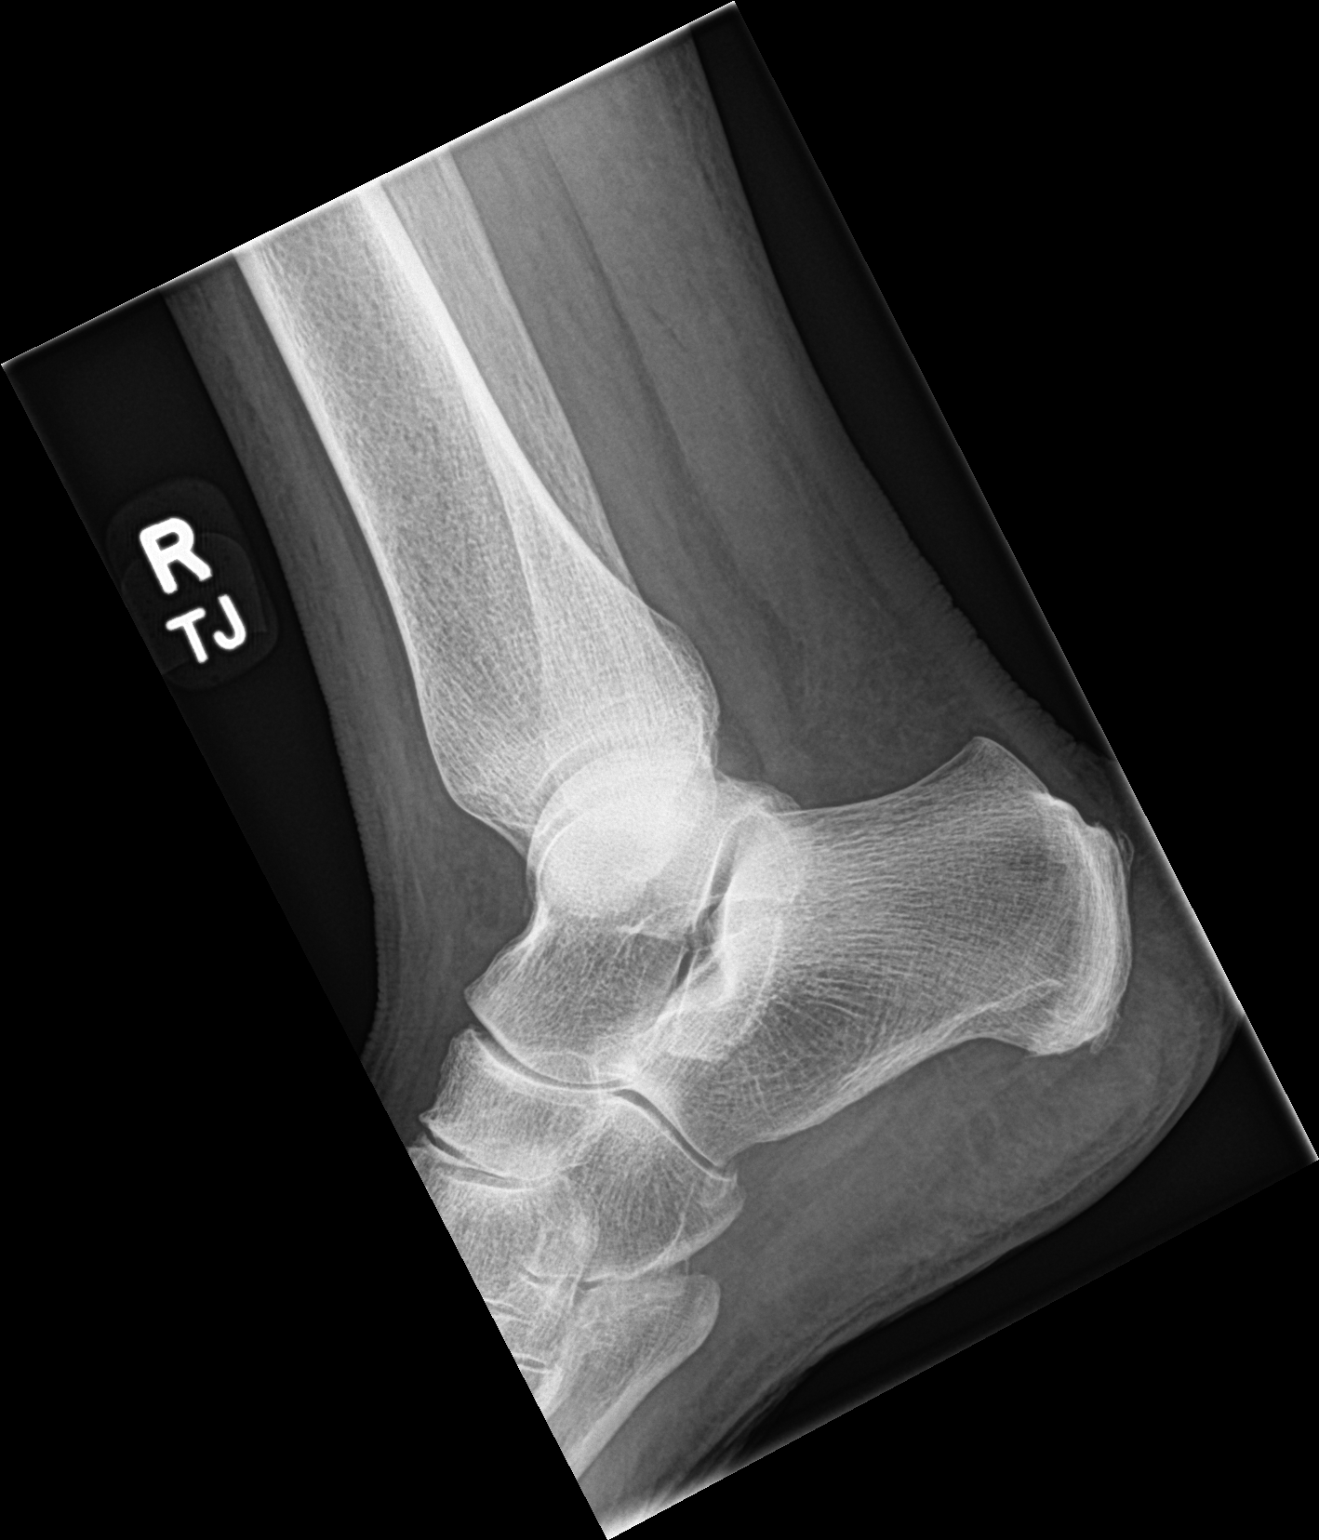

[ankle obl]
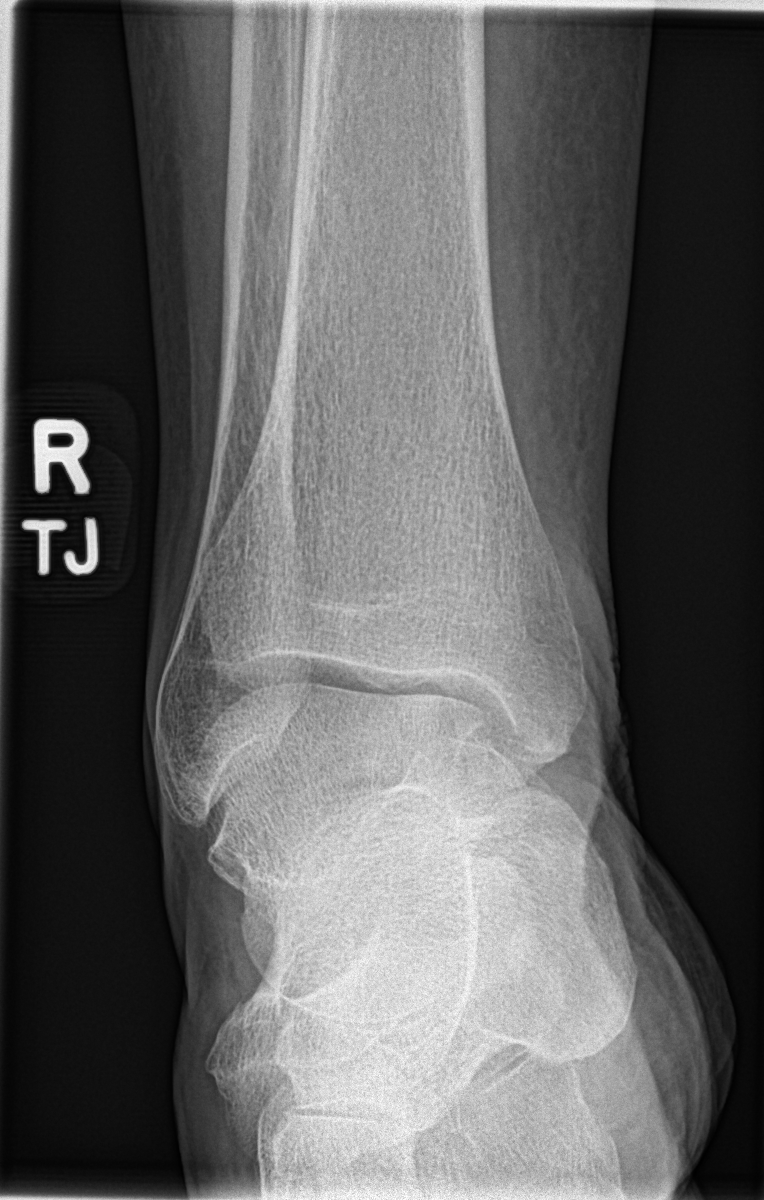

[ankle mortise]
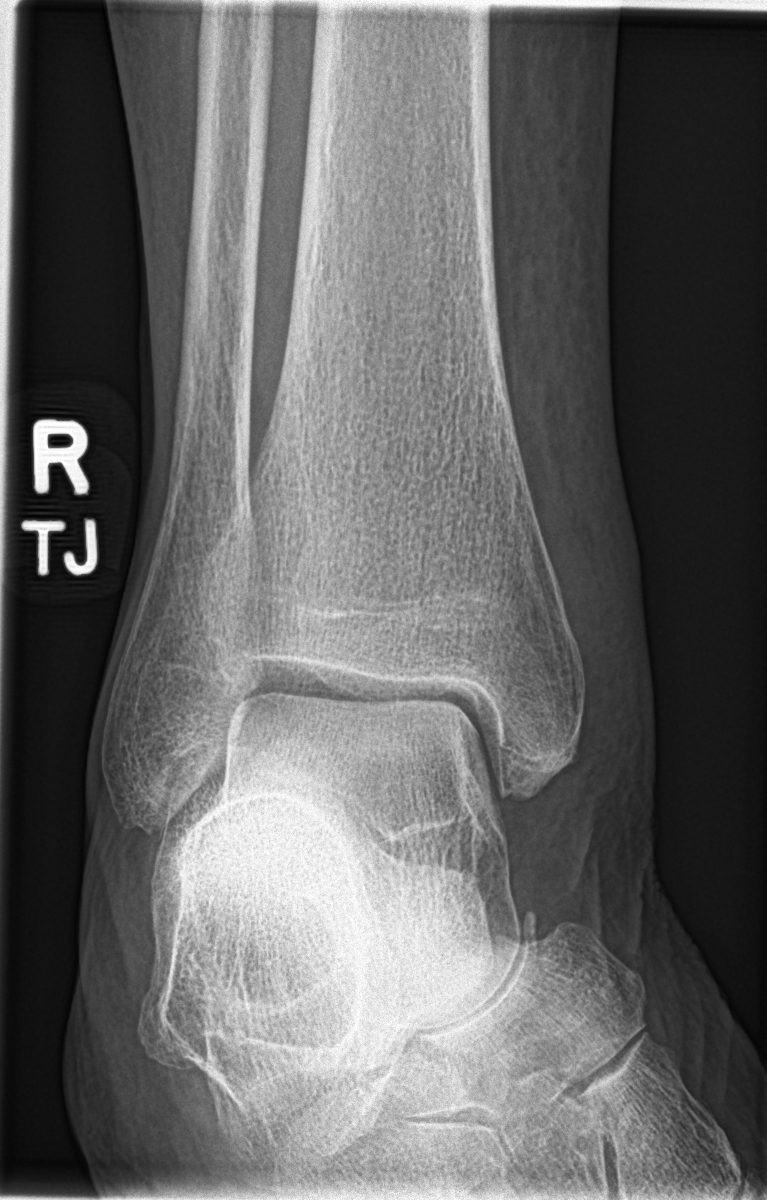

[3 of 3 positions shown; findings below may reference images not displayed]

FINDINGS: There is no evidence of fracture, dislocation, or joint effusion.
There is no evidence of arthropathy or other focal bone abnormality.
Soft tissues are unremarkable.
IMPRESSION: Negative.

## 2021-09-03 ENCOUNTER — Encounter (INDEPENDENT_AMBULATORY_CARE_PROVIDER_SITE_OTHER): Payer: Self-pay

## 2021-09-03 DIAGNOSIS — G4733 Obstructive sleep apnea (adult) (pediatric): Secondary | ICD-10-CM | POA: Diagnosis not present

## 2021-09-30 DIAGNOSIS — H2513 Age-related nuclear cataract, bilateral: Secondary | ICD-10-CM | POA: Diagnosis not present

## 2021-09-30 DIAGNOSIS — H524 Presbyopia: Secondary | ICD-10-CM | POA: Diagnosis not present

## 2021-10-02 NOTE — Progress Notes (Signed)
[facility] Quality Team Note  Name: Fredric Dine Heist Date of Birth: 1945/08/06 MRN: [id number] Date: 10/02/2021  [facility] Quality Team has reviewed this patient's chart, please see recommendations below:  [facility] Quality Other; Pt has open quality gap for COL, needs colorectal cancer screening to close this. Please address at next visit. Quality Coordinator will help get pt scheduled or have kit sent to her home if pt is agreeable.

## 2021-10-04 DIAGNOSIS — G4733 Obstructive sleep apnea (adult) (pediatric): Secondary | ICD-10-CM | POA: Diagnosis not present

## 2021-10-28 ENCOUNTER — Ambulatory Visit (INDEPENDENT_AMBULATORY_CARE_PROVIDER_SITE_OTHER): Payer: Medicare HMO | Admitting: Family Medicine

## 2021-10-28 ENCOUNTER — Encounter: Payer: Self-pay | Admitting: Family Medicine

## 2021-10-28 VITALS — BP 110/60 | HR 69 | Temp 97.8°F | Ht 65.0 in | Wt 190.0 lb

## 2021-10-28 DIAGNOSIS — W57XXXA Bitten or stung by nonvenomous insect and other nonvenomous arthropods, initial encounter: Secondary | ICD-10-CM | POA: Insufficient documentation

## 2021-10-28 DIAGNOSIS — S2096XA Insect bite (nonvenomous) of unspecified parts of thorax, initial encounter: Secondary | ICD-10-CM | POA: Diagnosis not present

## 2021-10-28 NOTE — Progress Notes (Signed)
Patient ID: Lori Shaw, female    DOB: February 15, 1945, 76 y.o.   MRN: [id number]  This visit was conducted in person.  BP 110/60   Pulse 69   Temp 97.8 F (36.6 C) (Oral)   Ht '5\' 5"'$  (1.651 m)   Wt 190 lb (86.2 kg)   SpO2 98%   BMI 31.62 kg/m    CC:  Chief Complaint  Patient presents with   Insect Bite    Bites on Chest and Back    Subjective:   HPI: [name] is a 76 y.o. female presenting on 10/28/2021 for Insect Bite (Bites on Chest and Back)    She noted new onset bite on chest.... then later  on  noted area on torso and back .  Very itchy. She has not noted any new lesions appear today. No blisters, no pustules  No  other family with issues. No outside time, no new exposures.   No tounge swelling, no trouble breathing, no fever.   Treated with benadryl.. helped a little.   He could be shingles.  She has an upcoming trip and does not want to  Relevant past medical, surgical, family and social history reviewed and updated as indicated. Interim medical history since our last visit reviewed. Allergies and medications reviewed and updated. Outpatient Medications Prior to Visit  Medication Sig Dispense Refill   Cholecalciferol (VITAMIN D) 125 MCG (5000 UT) CAPS Take 1 capsule by mouth daily. 30 capsule    Cyanocobalamin (VITAMIN B-12) 2500 MCG SUBL Place 1 tablet (2,500 mcg total) under the tongue daily.  0   fluticasone (FLONASE) 50 MCG/ACT nasal spray PLACE 2 SPRAYS INTO BOTH NOSTRILS DAILY AS NEEDED 16 mL 0   Multiple Vitamins-Minerals (PRESERVISION AREDS) TABS Take 2 tablets by mouth daily.     No facility-administered medications prior to visit.     Per HPI unless specifically indicated in ROS section below Review of Systems  Constitutional:  Negative for fatigue and fever.  HENT:  Negative for congestion.   Eyes:  Negative for pain.  Respiratory:  Negative for cough and shortness of breath.   Cardiovascular:  Negative for chest  pain, palpitations and leg swelling.  Gastrointestinal:  Negative for abdominal pain.  Genitourinary:  Negative for dysuria and vaginal bleeding.  Musculoskeletal:  Negative for back pain.  Neurological:  Negative for syncope, light-headedness and headaches.  Psychiatric/Behavioral:  Negative for dysphoric mood.    Objective:  BP 110/60   Pulse 69   Temp 97.8 F (36.6 C) (Oral)   Ht '5\' 5"'$  (1.651 m)   Wt 190 lb (86.2 kg)   SpO2 98%   BMI 31.62 kg/m   Wt Readings from Last 3 Encounters:  10/28/21 190 lb (86.2 kg)  05/22/21 192 lb (87.1 kg)  01/13/21 193 lb 8 oz (87.8 kg)      Physical Exam Constitutional:      General: She is not in acute distress.    Appearance: Normal appearance. She is well-developed. She is not ill-appearing or toxic-appearing.  HENT:     Head: Normocephalic.     Right Ear: Hearing, tympanic membrane, ear canal and external ear normal. Tympanic membrane is not erythematous, retracted or bulging.     Left Ear: Hearing, tympanic membrane, ear canal and external ear normal. Tympanic membrane is not erythematous, retracted or bulging.     Nose: No mucosal edema or rhinorrhea.     Right Sinus: No maxillary sinus tenderness or frontal sinus  tenderness.     Left Sinus: No maxillary sinus tenderness or frontal sinus tenderness.     Mouth/Throat:     Pharynx: Uvula midline.  Eyes:     General: Lids are normal. Lids are everted, no foreign bodies appreciated.     Conjunctiva/sclera: Conjunctivae normal.     Pupils: Pupils are equal, round, and reactive to light.  Neck:     Thyroid: No thyroid mass or thyromegaly.     Vascular: No carotid bruit.     Trachea: Trachea normal.  Cardiovascular:     Rate and Rhythm: Normal rate and regular rhythm.     Pulses: Normal pulses.     Heart sounds: Normal heart sounds, S1 normal and S2 normal. No murmur heard.    No friction rub. No gallop.  Pulmonary:     Effort: Pulmonary effort is normal. No tachypnea or respiratory  distress.     Breath sounds: Normal breath sounds. No decreased breath sounds, wheezing, rhonchi or rales.  Abdominal:     General: Bowel sounds are normal.     Palpations: Abdomen is soft.     Tenderness: There is no abdominal tenderness.  Musculoskeletal:     Cervical back: Normal range of motion and neck supple.  Skin:    General: Skin is warm and dry.     Findings: No rash.     Comments: Erythematous nodules x 4-5 on torso abdomen and chest  Neurological:     Mental Status: She is alert.  Psychiatric:        Mood and Affect: Mood is not anxious or depressed.        Speech: Speech normal.        Behavior: Behavior normal. Behavior is cooperative.        Thought Content: Thought content normal.        Judgment: Judgment normal.       Results for orders placed or performed in visit on 07/11/21  Comprehensive metabolic panel  Result Value Ref Range   Sodium 140 135 - 145 mmol/L   Potassium 3.7 3.5 - 5.1 mmol/L   Chloride 107 98 - 111 mmol/L   CO2 23 22 - 32 mmol/L   Glucose, Bld 99 70 - 99 mg/dL   BUN 12 8 - 23 mg/dL   Creatinine, Ser 0.76 0.44 - 1.00 mg/dL   Calcium 8.9 8.9 - 10.3 mg/dL   Total Protein 6.7 6.5 - 8.1 g/dL   Albumin 4.2 3.5 - 5.0 g/dL   AST 23 15 - 41 U/L   ALT 21 0 - 44 U/L   Alkaline Phosphatase 69 38 - 126 U/L   Total Bilirubin 0.9 0.3 - 1.2 mg/dL   GFR, Estimated >60 >60 mL/min   Anion gap 10 5 - 15  CBC with Differential/Platelet  Result Value Ref Range   WBC 26.0 (H) 4.0 - 10.5 K/uL   RBC 5.19 (H) 3.87 - 5.11 MIL/uL   Hemoglobin 16.3 (H) 12.0 - 15.0 g/dL   HCT 48.3 (H) 36.0 - 46.0 %   MCV 93.1 80.0 - 100.0 fL   MCH 31.4 26.0 - 34.0 pg   MCHC 33.7 30.0 - 36.0 g/dL   RDW 14.6 11.5 - 15.5 %   Platelets 158 150 - 400 K/uL   nRBC 0.0 0.0 - 0.2 %   Neutrophils Relative % 18 %   Neutro Abs 4.6 1.7 - 7.7 K/uL   Lymphocytes Relative 67 %   Lymphs Abs 17.6 (H) 0.7 -  4.0 K/uL   Monocytes Relative 12 %   Monocytes Absolute 3.2 (H) 0.1 - 1.0 K/uL    Eosinophils Relative 2 %   Eosinophils Absolute 0.5 0.0 - 0.5 K/uL   Basophils Relative 1 %   Basophils Absolute 0.2 (H) 0.0 - 0.1 K/uL   WBC Morphology DIFF. CONFIRMED BY SMEAR    RBC Morphology MORPHOLOGY UNREMARKABLE    Smear Review Normal platelet morphology    Immature Granulocytes 0 %   Abs Immature Granulocytes 0.05 0.00 - 0.07 K/uL     COVID 19 screen:  No recent travel or known exposure to COVID19 The patient denies respiratory symptoms of COVID 19 at this time. The importance of social distancing was discussed today.   Assessment and Plan Problem List Items Addressed This Visit     Insect bite of thoracic wall - Primary    Acute, no concerns of bacterial, fungal infection.  Not consistent with shingles as it crosses the midline Offered treatment with topical steroid cream or course of oral steroids but she is minimally bothered so we will continue to treat with Benadryl as needed.        Eliezer Lofts, MD

## 2021-10-28 NOTE — Assessment & Plan Note (Signed)
Acute, no concerns of bacterial, fungal infection.  Not consistent with shingles as it crosses the midline Offered treatment with topical steroid cream or course of oral steroids but she is minimally bothered so we will continue to treat with Benadryl as needed.

## 2021-11-03 DIAGNOSIS — G4733 Obstructive sleep apnea (adult) (pediatric): Secondary | ICD-10-CM | POA: Diagnosis not present

## 2021-12-04 DIAGNOSIS — G4733 Obstructive sleep apnea (adult) (pediatric): Secondary | ICD-10-CM | POA: Diagnosis not present

## 2022-01-03 DIAGNOSIS — G4733 Obstructive sleep apnea (adult) (pediatric): Secondary | ICD-10-CM | POA: Diagnosis not present

## 2022-01-09 ENCOUNTER — Inpatient Hospital Stay: Payer: Medicare HMO | Attending: Oncology

## 2022-01-09 ENCOUNTER — Encounter: Payer: Self-pay | Admitting: Oncology

## 2022-01-09 ENCOUNTER — Inpatient Hospital Stay (HOSPITAL_BASED_OUTPATIENT_CLINIC_OR_DEPARTMENT_OTHER): Payer: Medicare HMO | Admitting: Oncology

## 2022-01-09 VITALS — BP 110/63 | HR 72 | Resp 18 | Wt 187.2 lb

## 2022-01-09 DIAGNOSIS — C9110 Chronic lymphocytic leukemia of B-cell type not having achieved remission: Secondary | ICD-10-CM | POA: Insufficient documentation

## 2022-01-09 LAB — CBC WITH DIFFERENTIAL/PLATELET
Abs Immature Granulocytes: 0.09 10*3/uL — ABNORMAL HIGH (ref 0.00–0.07)
Basophils Absolute: 0.2 10*3/uL — ABNORMAL HIGH (ref 0.0–0.1)
Basophils Relative: 1 %
Eosinophils Absolute: 0.5 10*3/uL (ref 0.0–0.5)
Eosinophils Relative: 1 %
HCT: 48.8 % — ABNORMAL HIGH (ref 36.0–46.0)
Hemoglobin: 16.3 g/dL — ABNORMAL HIGH (ref 12.0–15.0)
Immature Granulocytes: 0 %
Lymphocytes Relative: 73 %
Lymphs Abs: 24.6 10*3/uL — ABNORMAL HIGH (ref 0.7–4.0)
MCH: 31.3 pg (ref 26.0–34.0)
MCHC: 33.4 g/dL (ref 30.0–36.0)
MCV: 93.7 fL (ref 80.0–100.0)
Monocytes Absolute: 3.5 10*3/uL — ABNORMAL HIGH (ref 0.1–1.0)
Monocytes Relative: 10 %
Neutro Abs: 5 10*3/uL (ref 1.7–7.7)
Neutrophils Relative %: 15 %
Platelets: 142 10*3/uL — ABNORMAL LOW (ref 150–400)
RBC: 5.21 MIL/uL — ABNORMAL HIGH (ref 3.87–5.11)
RDW: 14.6 % (ref 11.5–15.5)
Smear Review: NORMAL
WBC: 33.8 10*3/uL — ABNORMAL HIGH (ref 4.0–10.5)
nRBC: 0 % (ref 0.0–0.2)

## 2022-01-09 LAB — COMPREHENSIVE METABOLIC PANEL
ALT: 19 U/L (ref 0–44)
AST: 25 U/L (ref 15–41)
Albumin: 4.1 g/dL (ref 3.5–5.0)
Alkaline Phosphatase: 65 U/L (ref 38–126)
Anion gap: 9 (ref 5–15)
BUN: 15 mg/dL (ref 8–23)
CO2: 25 mmol/L (ref 22–32)
Calcium: 8.8 mg/dL — ABNORMAL LOW (ref 8.9–10.3)
Chloride: 106 mmol/L (ref 98–111)
Creatinine, Ser: 1.07 mg/dL — ABNORMAL HIGH (ref 0.44–1.00)
GFR, Estimated: 54 mL/min — ABNORMAL LOW (ref >60)
Glucose, Bld: 110 mg/dL — ABNORMAL HIGH (ref 70–99)
Potassium: 4.3 mmol/L (ref 3.5–5.1)
Sodium: 140 mmol/L (ref 135–145)
Total Bilirubin: 0.7 mg/dL (ref 0.3–1.2)
Total Protein: 6.5 g/dL (ref 6.5–8.1)

## 2022-01-10 NOTE — Progress Notes (Signed)
Hematology/Oncology Consult note [facility]  [phone] [phone]  Patient Care Team: Ria Bush, MD as PCP - General (Family Medicine) Solon Augusta, MD as Attending Physician (Ophthalmology) Elbert Ewings, DDS as Consulting Physician (Dentistry) Sindy Guadeloupe, MD as Consulting Physician (Hematology and Oncology)   Name of the patient: Lori Shaw  [id number]  1945/02/08   Date of visit: 01/10/22  Diagnosis-Rai stage 0 CLL  Chief complaint/ Reason for visit-routine follow-up of CLL  Heme/Onc history: patient is a 76 year old female with no significant past medical history other than low B12 and low vitamin D levels. Patient was recently noted to have leukocytosis with a white count of 13. Differential mainly showed lymphocytosis. Recent CBC from 07/03/2016 showed white count of 13, H&H of 14.7/44 and a platelet count of 207. Differential showed 63.5% lymphocytes. Peripheral flow cytometry showed CD5 positive, CD23 positive clonal B-cell population CLL/SLL phenotype.    Interval history-energy levels are stable.  Patient reports feeling well overall.  Denies any changes in her appetite.  She is intentionally trying to lose weight.  Denies any lumps or bumps anywhere.  ECOG PS- 1 Pain scale- 0   Review of systems- Review of Systems  Constitutional:  Negative for chills, fever, malaise/fatigue and weight loss.  HENT:  Negative for congestion, ear discharge and nosebleeds.   Eyes:  Negative for blurred vision.  Respiratory:  Negative for cough, hemoptysis, sputum production, shortness of breath and wheezing.   Cardiovascular:  Negative for chest pain, palpitations, orthopnea and claudication.  Gastrointestinal:  Negative for abdominal pain, blood in stool, constipation, diarrhea, heartburn, melena, nausea and vomiting.  Genitourinary:  Negative for dysuria, flank pain, frequency, hematuria and urgency.   Musculoskeletal:  Negative for back pain, joint pain and myalgias.  Skin:  Negative for rash.  Neurological:  Negative for dizziness, tingling, focal weakness, seizures, weakness and headaches.  Endo/Heme/Allergies:  Does not bruise/bleed easily.  Psychiatric/Behavioral:  Negative for depression and suicidal ideas. The patient does not have insomnia.       Allergies  Allergen Reactions   Sulfa Antibiotics Nausea Only and Nausea And Vomiting     Past Medical History:  Diagnosis Date   B12 deficiency    Back pain    CLL (chronic lymphocytic leukemia) ([place]) 06/21/2016   Rai stage 0, dx 06/2016 (Dr Janese Banks)   HLD (hyperlipidemia)    Joint pain    Knee problem    Macular degeneration of both eyes    OSA on CPAP 2006, 04/2014   AHI of 23.8 and an RDI of 42.9, on CPAP 9 cm H2O (Dohmeier)   Osteopenia 08/2010   T -1.4 at L femur neck   Piriformis syndrome 12/14/2014   Vitamin D deficiency      Past Surgical History:  Procedure Laterality Date   ABDOMINAL HYSTERECTOMY  01/27/1983   for menometrorrhagia, one ovary remained   CHOLECYSTECTOMY N/A 08/01/2018   LAPAROSCOPIC CHOLECYSTECTOMY; for gallstone pancreatitis  Celine Ahr, Anderson Malta, MD)   COLONOSCOPY  2012   no records available. per patient normal.    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, fitness trainer  Tobacco Use   Smoking  status: Former    Packs/day: 2.00    Years: 20.00    Total pack years: 40.00    Types: Cigarettes    Quit date: 01/26/1978    Years since quitting: 43.9   Smokeless tobacco: Never  Vaping Use   Vaping Use: Never used  Substance and Sexual Activity   Alcohol use: Yes    Comment: occasionally   Drug use: Never   Sexual activity:  Yes  Other Topics Concern   Not on file  Social History Narrative   Lives with daughter, 1 dog   Occupation: retired, has worked in Chief Strategy Officer, also Forensic psychologist   Edu: 1.5 yrs college   Activity: square dancing   Diet: good water, fruits/vegetables daily   Patient drinks two cups of caffeine daily.   Remarried.     Social Determinants of Health   Financial Resource Strain: Low Risk  (10/24/2019)   Overall Financial Resource Strain (CARDIA)    Difficulty of Paying Living Expenses: Not hard at all  Food Insecurity: No Food Insecurity (10/24/2019)   Hunger Vital Sign    Worried About Running Out of Food in the Last Year: Never true    Ran Out of Food in the Last Year: Never true  Transportation Needs: No Transportation Needs (10/24/2019)   PRAPARE - Hydrologist (Medical): No    Lack of Transportation (Non-Medical): No  Physical Activity: Inactive (10/24/2019)   Exercise Vital Sign    Days of Exercise per Week: 0 days    Minutes of Exercise per Session: 0 min  Stress: No Stress Concern Present (10/24/2019)   [place]    Feeling of Stress : Not at all  Social Connections: Not on file  Intimate Partner Violence: Not At Risk (10/24/2019)   Humiliation, Afraid, Rape, and Kick questionnaire    Fear of Current or Ex-Partner: No    Emotionally Abused: No    Physically Abused: No    Sexually Abused: No    Family History  Problem Relation Age of Onset   Hyperlipidemia Mother    CAD Mother        CABG   Hypertension Mother    Hyperlipidemia Father    CAD Father        MI   Sudden death Father    Arthritis Maternal Grandmother    Cancer Sister        breast   Breast cancer Sister 41   Alzheimer's disease Other    Heart attack Other    Diabetes Neg Hx    Stroke Neg Hx      Current Outpatient Medications:    Cholecalciferol (VITAMIN D) 125 MCG (5000 UT) CAPS, Take 1  capsule by mouth daily., Disp: 30 capsule, Rfl:    Cyanocobalamin (VITAMIN B-12) 2500 MCG SUBL, Place 1 tablet (2,500 mcg total) under the tongue daily., Disp: , Rfl: 0   Multiple Vitamins-Minerals (PRESERVISION AREDS) TABS, Take 2 tablets by mouth daily., Disp: , Rfl:    fluticasone (FLONASE) 50 MCG/ACT nasal spray, PLACE 2 SPRAYS INTO BOTH NOSTRILS DAILY AS NEEDED (Patient not taking: Reported on 01/09/2022), Disp: 16 mL, Rfl: 0  Physical exam:  Vitals:   01/09/22 1110  BP: 110/63  Pulse: 72  Resp: 18  SpO2: 98%  Weight: 187 lb 3.2 oz (84.9 kg)   Physical Exam Cardiovascular:     Rate and Rhythm: Normal rate and regular rhythm.  Heart sounds: Normal heart sounds.  Pulmonary:     Effort: Pulmonary effort is normal.     Breath sounds: Normal breath sounds.  Abdominal:     General: Bowel sounds are normal.     Palpations: Abdomen is soft.  Lymphadenopathy:     Comments: No palpable cervical, supraclavicular, axillary or inguinal adenopathy    Skin:    General: Skin is warm and dry.  Neurological:     Mental Status: She is alert and oriented to person, place, and time.         Latest Ref Rng & Units 01/09/2022   10:55 AM  CMP  Glucose 70 - 99 mg/dL 110   BUN 8 - 23 mg/dL 15   Creatinine 0.44 - 1.00 mg/dL 1.07   Sodium 135 - 145 mmol/L 140   Potassium 3.5 - 5.1 mmol/L 4.3   Chloride 98 - 111 mmol/L 106   CO2 22 - 32 mmol/L 25   Calcium 8.9 - 10.3 mg/dL 8.8   Total Protein 6.5 - 8.1 g/dL 6.5   Total Bilirubin 0.3 - 1.2 mg/dL 0.7   Alkaline Phos 38 - 126 U/L 65   AST 15 - 41 U/L 25   ALT 0 - 44 U/L 19       Latest Ref Rng & Units 01/09/2022   10:55 AM  CBC  WBC 4.0 - 10.5 K/uL 33.8   Hemoglobin 12.0 - 15.0 g/dL 16.3   Hematocrit 36.0 - 46.0 % 48.8   Platelets 150 - 400 K/uL 142      Assessment and plan- Patient is a 76 y.o. female here for routine follow-up of CLL  Clinically patient is doing well with no concerning B symptoms.  No palpable adenopathy  or hepatosplenomegaly on today's exam.  Her white cell count is somewhat higher at 33 but her absolute lymphocyte count doubling time remains more than 6 months.  Her CLL does not require any treatment at this time.  Will repeat CBC with differential in 6 months in 1 year and I will see her back in 1 year   Visit Diagnosis 1. CLL (chronic lymphocytic leukemia) ([place])      Dr. Randa Evens, MD, MPH [facility] [phone] 01/10/2022 12:39 PM

## 2022-02-03 DIAGNOSIS — G4733 Obstructive sleep apnea (adult) (pediatric): Secondary | ICD-10-CM | POA: Diagnosis not present

## 2022-02-23 DIAGNOSIS — G4733 Obstructive sleep apnea (adult) (pediatric): Secondary | ICD-10-CM | POA: Diagnosis not present

## 2022-03-06 DIAGNOSIS — G4733 Obstructive sleep apnea (adult) (pediatric): Secondary | ICD-10-CM | POA: Diagnosis not present

## 2022-03-24 DIAGNOSIS — R69 Illness, unspecified: Secondary | ICD-10-CM | POA: Diagnosis not present

## 2022-05-08 ENCOUNTER — Other Ambulatory Visit: Payer: Self-pay | Admitting: Family Medicine

## 2022-05-08 DIAGNOSIS — Z1231 Encounter for screening mammogram for malignant neoplasm of breast: Secondary | ICD-10-CM

## 2022-05-21 NOTE — Progress Notes (Signed)
PATIENT: Lori Shaw DOB: April 18, 1945  REASON FOR VISIT: follow up HISTORY FROM: patient PRIMARY NEUROLOGIST: Dr. Vickey Huger  Chief Complaint  Patient presents with   Follow-up    Pt in 18 Pt here CPAP f/u Pt has no questions or concerns for today's visit      HISTORY OF PRESENT ILLNESS: Today 05/25/22:  Lori Shaw is a 77 y.o. female with a history of OSA on CPAP. Returns today for follow-up. Continues to find it beneficial. When she doesn't use it she will start snore. DL is below         REVIEW OF SYSTEMS: Out of a complete 14 system review of symptoms, the patient complains only of the following symptoms, and all other reviewed systems are negative.  FSS 19 ESS 4  ALLERGIES: Allergies  Allergen Reactions   Sulfa Antibiotics Nausea Only and Nausea And Vomiting    HOME MEDICATIONS: Outpatient Medications Prior to Visit  Medication Sig Dispense Refill   Cholecalciferol (VITAMIN D) 125 MCG (5000 UT) CAPS Take 1 capsule by mouth daily. 30 capsule    Cyanocobalamin (VITAMIN B-12) 2500 MCG SUBL Place 1 tablet (2,500 mcg total) under the tongue daily.  0   fluticasone (FLONASE) 50 MCG/ACT nasal spray PLACE 2 SPRAYS INTO BOTH NOSTRILS DAILY AS NEEDED (Patient taking differently: Place 2 sprays into both nostrils as needed.) 16 mL 0   Multiple Vitamins-Minerals (PRESERVISION AREDS) TABS Take 2 tablets by mouth daily.     No facility-administered medications prior to visit.    PAST MEDICAL HISTORY: Past Medical History:  Diagnosis Date   B12 deficiency    Back pain    CLL (chronic lymphocytic leukemia) ([facility]) 06/21/2016   Rai stage 0, dx 06/2016 (Dr Smith Robert)   HLD (hyperlipidemia)    Joint pain    Knee problem    Macular degeneration of both eyes    OSA on CPAP 2006, 04/2014   AHI of 23.8 and an RDI of 42.9, on CPAP 9 cm H2O (Dohmeier)   Osteopenia 08/2010   T -1.4 at L femur neck   Piriformis syndrome 12/14/2014   Vitamin D deficiency      PAST SURGICAL HISTORY: Past Surgical History:  Procedure Laterality Date   ABDOMINAL HYSTERECTOMY  01/27/1983   for menometrorrhagia, one ovary remained   CHOLECYSTECTOMY N/A 08/01/2018   LAPAROSCOPIC CHOLECYSTECTOMY; for gallstone pancreatitis  Lady Gary, Victorino Dike, MD)   COLONOSCOPY  2012   no records available. per patient normal.    dexa  08/2010   osteopenia   TONSILLECTOMY  01/26/1973    FAMILY HISTORY: Family History  Problem Relation Age of Onset   Hyperlipidemia Mother    CAD Mother        CABG   Hypertension Mother    Hyperlipidemia Father    CAD Father        MI   Sudden death Father    Cancer Sister        breast   Breast cancer Sister 19   Sleep apnea Brother    Sleep apnea Brother    Sleep apnea Brother    Arthritis Maternal Grandmother    Alzheimer's disease Other    Heart attack Other    Diabetes Neg Hx    Stroke Neg Hx     SOCIAL HISTORY: Social History   Socioeconomic History   Marital status: Married    Spouse name: Gala Romney Ingles   Number of children: 2   Years of education: 1.5  Highest education level: Not on file  Occupational History   Occupation: retired, 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, fitness trainer  Tobacco Use   Smoking status: Former    Packs/day: 2.00    Years: 20.00    Additional pack years: 0.00    Total pack years: 40.00    Types: Cigarettes    Quit date: 01/26/1978    Years since quitting: 44.3   Smokeless tobacco: Never  Vaping Use   Vaping Use: Never used  Substance and Sexual Activity   Alcohol use: Yes    Comment: occasionally   Drug use: Never   Sexual activity: Yes  Other Topics Concern   Not on file  Social History Narrative   Lives with daughter, 1 dog   Occupation: retired, has worked in Pharmacologist, also Optometrist   Edu: 1.5 yrs college   Activity: square dancing   Diet: good water, fruits/vegetables daily   Patient drinks two cups of caffeine daily.   Remarried.     Social Determinants of Health    Financial Resource Strain: Low Risk  (10/24/2019)   Overall Financial Resource Strain (CARDIA)    Difficulty of Paying Living Expenses: Not hard at all  Food Insecurity: No Food Insecurity (10/24/2019)   Hunger Vital Sign    Worried About Running Out of Food in the Last Year: Never true    Ran Out of Food in the Last Year: Never true  Transportation Needs: No Transportation Needs (10/24/2019)   PRAPARE - Administrator, Civil Service (Medical): No    Lack of Transportation (Non-Medical): No  Physical Activity: Inactive (10/24/2019)   Exercise Vital Sign    Days of Exercise per Week: 0 days    Minutes of Exercise per Session: 0 min  Stress: No Stress Concern Present (10/24/2019)   [employer] of Occupational Health - Occupational Stress Questionnaire    Feeling of Stress : Not at all  Social Connections: Not on file  Intimate Partner Violence: Not At Risk (10/24/2019)   Humiliation, Afraid, Rape, and Kick questionnaire    Fear of Current or Ex-Partner: No    Emotionally Abused: No    Physically Abused: No    Sexually Abused: No      PHYSICAL EXAM  Vitals:   05/25/22 1325  BP: 114/65  Pulse: 82  Weight: 185 lb (83.9 kg)  Height: 5\' 5"  (1.651 m)   Body mass index is 30.79 kg/m.  Generalized: Well developed, in no acute distress  Chest: Lungs clear to auscultation bilaterally  Neurological examination  Mentation: Alert oriented to time, place, history taking. Follows all commands speech and language fluent Cranial nerve II-XII: facial Symmetry noted  DIAGNOSTIC DATA (LABS, IMAGING, TESTING) - I reviewed patient records, labs, notes, testing and imaging myself where available.  Lab Results  Component Value Date   WBC 33.8 (H) 01/09/2022   HGB 16.3 (H) 01/09/2022   HCT 48.8 (H) 01/09/2022   MCV 93.7 01/09/2022   PLT 142 (L) 01/09/2022      Component Value Date/Time   NA 140 01/09/2022 1055   NA 142 02/02/2020 1448   K 4.3 01/09/2022 1055   CL  106 01/09/2022 1055   CO2 25 01/09/2022 1055   GLUCOSE 110 (H) 01/09/2022 1055   BUN 15 01/09/2022 1055   BUN 11 02/02/2020 1448   CREATININE 1.07 (H) 01/09/2022 1055   CALCIUM 8.8 (L) 01/09/2022 1055   PROT 6.5 01/09/2022 1055   PROT 6.2 02/02/2020 1448  ALBUMIN 4.1 01/09/2022 1055   ALBUMIN 4.6 02/02/2020 1448   AST 25 01/09/2022 1055   AST 18 04/10/2010 0000   ALT 19 01/09/2022 1055   ALKPHOS 65 01/09/2022 1055   ALKPHOS 79 04/10/2010 0000   BILITOT 0.7 01/09/2022 1055   BILITOT 0.3 02/02/2020 1448   GFRNONAA 54 (L) 01/09/2022 1055   GFRAA 98 02/02/2020 1448   Lab Results  Component Value Date   CHOL 145 10/24/2019   HDL 45.20 10/24/2019   LDLCALC 153 (H) 07/15/2018   LDLDIRECT 71.0 10/24/2019   TRIG 210.0 (H) 10/24/2019   CHOLHDL 3 10/24/2019   Lab Results  Component Value Date   HGBA1C 5.4 03/17/2018   Lab Results  Component Value Date   VITAMINB12 248 10/24/2019   Lab Results  Component Value Date   TSH 2.84 03/29/2019      ASSESSMENT AND PLAN 77 y.o. year old female  has a past medical history of B12 deficiency, Back pain, CLL (chronic lymphocytic leukemia) ([facility]) (06/21/2016), HLD (hyperlipidemia), Joint pain, Knee problem, Macular degeneration of both eyes, OSA on CPAP (2006, 04/2014), Osteopenia (08/2010), Piriformis syndrome (12/14/2014), and Vitamin D deficiency. here with:  OSA on CPAP  - CPAP compliance excellent - Good treatment of AHI  - Encourage patient to use CPAP nightly and > 4 hours each night - F/U in 1 year or sooner if needed    Butch Penny, MSN, NP-C 05/25/2022, 1:34 PM [facility] [address] [place], [place] [address] [phone]

## 2022-05-25 ENCOUNTER — Ambulatory Visit: Payer: Medicare HMO | Admitting: Adult Health

## 2022-05-25 ENCOUNTER — Encounter: Payer: Self-pay | Admitting: Adult Health

## 2022-05-25 VITALS — BP 114/65 | HR 82 | Ht 65.0 in | Wt 185.0 lb

## 2022-05-25 DIAGNOSIS — G4733 Obstructive sleep apnea (adult) (pediatric): Secondary | ICD-10-CM

## 2022-05-25 NOTE — Patient Instructions (Signed)
Continue using CPAP nightly and greater than 4 hours each night °If your symptoms worsen or you develop new symptoms please let us know.  ° °

## 2022-05-27 DIAGNOSIS — G4733 Obstructive sleep apnea (adult) (pediatric): Secondary | ICD-10-CM | POA: Diagnosis not present

## 2022-06-02 DIAGNOSIS — B0089 Other herpesviral infection: Secondary | ICD-10-CM | POA: Diagnosis not present

## 2022-07-09 DIAGNOSIS — Z803 Family history of malignant neoplasm of breast: Secondary | ICD-10-CM | POA: Diagnosis not present

## 2022-07-09 DIAGNOSIS — H3530 Unspecified macular degeneration: Secondary | ICD-10-CM | POA: Diagnosis not present

## 2022-07-09 DIAGNOSIS — J302 Other seasonal allergic rhinitis: Secondary | ICD-10-CM | POA: Diagnosis not present

## 2022-07-09 DIAGNOSIS — I10 Essential (primary) hypertension: Secondary | ICD-10-CM | POA: Diagnosis not present

## 2022-07-09 DIAGNOSIS — Z8249 Family history of ischemic heart disease and other diseases of the circulatory system: Secondary | ICD-10-CM | POA: Diagnosis not present

## 2022-07-09 DIAGNOSIS — Z87891 Personal history of nicotine dependence: Secondary | ICD-10-CM | POA: Diagnosis not present

## 2022-07-09 DIAGNOSIS — Z008 Encounter for other general examination: Secondary | ICD-10-CM | POA: Diagnosis not present

## 2022-07-09 DIAGNOSIS — K760 Fatty (change of) liver, not elsewhere classified: Secondary | ICD-10-CM | POA: Diagnosis not present

## 2022-07-09 DIAGNOSIS — M8580 Other specified disorders of bone density and structure, unspecified site: Secondary | ICD-10-CM | POA: Diagnosis not present

## 2022-07-09 DIAGNOSIS — G4733 Obstructive sleep apnea (adult) (pediatric): Secondary | ICD-10-CM | POA: Diagnosis not present

## 2022-07-09 DIAGNOSIS — Z823 Family history of stroke: Secondary | ICD-10-CM | POA: Diagnosis not present

## 2022-07-09 DIAGNOSIS — R32 Unspecified urinary incontinence: Secondary | ICD-10-CM | POA: Diagnosis not present

## 2022-07-09 DIAGNOSIS — C9590 Leukemia, unspecified not having achieved remission: Secondary | ICD-10-CM | POA: Diagnosis not present

## 2022-07-10 ENCOUNTER — Other Ambulatory Visit: Payer: Self-pay | Admitting: *Deleted

## 2022-07-10 ENCOUNTER — Inpatient Hospital Stay: Payer: Medicare HMO | Attending: Oncology

## 2022-07-10 ENCOUNTER — Other Ambulatory Visit: Payer: Self-pay

## 2022-07-10 DIAGNOSIS — C9110 Chronic lymphocytic leukemia of B-cell type not having achieved remission: Secondary | ICD-10-CM

## 2022-07-10 LAB — CBC WITH DIFFERENTIAL (CANCER CENTER ONLY)
Abs Immature Granulocytes: 0.16 10*3/uL — ABNORMAL HIGH (ref 0.00–0.07)
Basophils Absolute: 0.4 10*3/uL — ABNORMAL HIGH (ref 0.0–0.1)
Basophils Relative: 1 %
Eosinophils Absolute: 0.7 10*3/uL — ABNORMAL HIGH (ref 0.0–0.5)
Eosinophils Relative: 2 %
HCT: 53.8 % — ABNORMAL HIGH (ref 36.0–46.0)
Hemoglobin: 17.6 g/dL — ABNORMAL HIGH (ref 12.0–15.0)
Immature Granulocytes: 0 %
Lymphocytes Relative: 73 %
Lymphs Abs: 34.3 10*3/uL — ABNORMAL HIGH (ref 0.7–4.0)
MCH: 30.9 pg (ref 26.0–34.0)
MCHC: 32.7 g/dL (ref 30.0–36.0)
MCV: 94.6 fL (ref 80.0–100.0)
Monocytes Absolute: 6.6 10*3/uL — ABNORMAL HIGH (ref 0.1–1.0)
Monocytes Relative: 14 %
Neutro Abs: 4.4 10*3/uL (ref 1.7–7.7)
Neutrophils Relative %: 10 %
Platelet Count: 90 10*3/uL — ABNORMAL LOW (ref 150–400)
RBC: 5.69 MIL/uL — ABNORMAL HIGH (ref 3.87–5.11)
RDW: 15.6 % — ABNORMAL HIGH (ref 11.5–15.5)
WBC Count: 46.5 10*3/uL — ABNORMAL HIGH (ref 4.0–10.5)
nRBC: 0 % (ref 0.0–0.2)

## 2022-07-10 LAB — CMP (CANCER CENTER ONLY)
ALT: 20 U/L (ref 0–44)
AST: 26 U/L (ref 15–41)
Albumin: 4.4 g/dL (ref 3.5–5.0)
Alkaline Phosphatase: 75 U/L (ref 38–126)
Anion gap: 9 (ref 5–15)
BUN: 11 mg/dL (ref 8–23)
CO2: 27 mmol/L (ref 22–32)
Calcium: 9.1 mg/dL (ref 8.9–10.3)
Chloride: 103 mmol/L (ref 98–111)
Creatinine: 0.86 mg/dL (ref 0.44–1.00)
GFR, Estimated: >60 mL/min (ref >60)
Glucose, Bld: 92 mg/dL (ref 70–99)
Potassium: 4.7 mmol/L (ref 3.5–5.1)
Sodium: 139 mmol/L (ref 135–145)
Total Bilirubin: 1 mg/dL (ref 0.3–1.2)
Total Protein: 6.9 g/dL (ref 6.5–8.1)

## 2022-07-21 DIAGNOSIS — Z0100 Encounter for examination of eyes and vision without abnormal findings: Secondary | ICD-10-CM | POA: Diagnosis not present

## 2022-08-13 DIAGNOSIS — D3132 Benign neoplasm of left choroid: Secondary | ICD-10-CM | POA: Diagnosis not present

## 2022-08-13 DIAGNOSIS — H353112 Nonexudative age-related macular degeneration, right eye, intermediate dry stage: Secondary | ICD-10-CM | POA: Diagnosis not present

## 2022-08-13 DIAGNOSIS — H353221 Exudative age-related macular degeneration, left eye, with active choroidal neovascularization: Secondary | ICD-10-CM | POA: Diagnosis not present

## 2022-08-13 DIAGNOSIS — D3131 Benign neoplasm of right choroid: Secondary | ICD-10-CM | POA: Diagnosis not present

## 2022-08-13 DIAGNOSIS — H25813 Combined forms of age-related cataract, bilateral: Secondary | ICD-10-CM | POA: Diagnosis not present

## 2022-08-14 ENCOUNTER — Inpatient Hospital Stay: Payer: Medicare HMO | Admitting: Oncology

## 2022-08-14 ENCOUNTER — Inpatient Hospital Stay: Payer: Medicare HMO

## 2022-08-17 ENCOUNTER — Other Ambulatory Visit: Payer: Self-pay | Admitting: *Deleted

## 2022-08-17 DIAGNOSIS — C9110 Chronic lymphocytic leukemia of B-cell type not having achieved remission: Secondary | ICD-10-CM

## 2022-08-18 ENCOUNTER — Encounter: Payer: Self-pay | Admitting: Oncology

## 2022-08-18 ENCOUNTER — Inpatient Hospital Stay: Payer: Medicare HMO

## 2022-08-18 ENCOUNTER — Inpatient Hospital Stay: Payer: Medicare HMO | Attending: Oncology

## 2022-08-18 ENCOUNTER — Inpatient Hospital Stay (HOSPITAL_BASED_OUTPATIENT_CLINIC_OR_DEPARTMENT_OTHER): Payer: Medicare HMO | Admitting: Oncology

## 2022-08-18 ENCOUNTER — Encounter: Payer: Self-pay | Admitting: *Deleted

## 2022-08-18 VITALS — BP 123/71 | HR 66 | Resp 18

## 2022-08-18 VITALS — BP 136/71 | HR 68 | Temp 97.4°F | Resp 20 | Wt 178.9 lb

## 2022-08-18 DIAGNOSIS — D751 Secondary polycythemia: Secondary | ICD-10-CM | POA: Insufficient documentation

## 2022-08-18 DIAGNOSIS — C9110 Chronic lymphocytic leukemia of B-cell type not having achieved remission: Secondary | ICD-10-CM | POA: Insufficient documentation

## 2022-08-18 LAB — CBC WITH DIFFERENTIAL (CANCER CENTER ONLY)
Abs Immature Granulocytes: 0.12 K/uL — ABNORMAL HIGH (ref 0.00–0.07)
Basophils Absolute: 0.5 K/uL — ABNORMAL HIGH (ref 0.0–0.1)
Basophils Relative: 1 %
Eosinophils Absolute: 0.7 K/uL — ABNORMAL HIGH (ref 0.0–0.5)
Eosinophils Relative: 1 %
HCT: 55.9 % — ABNORMAL HIGH (ref 36.0–46.0)
Hemoglobin: 18 g/dL — ABNORMAL HIGH (ref 12.0–15.0)
Immature Granulocytes: 0 %
Lymphocytes Relative: 74 %
Lymphs Abs: 40.2 K/uL — ABNORMAL HIGH (ref 0.7–4.0)
MCH: 31.6 pg (ref 26.0–34.0)
MCHC: 32.2 g/dL (ref 30.0–36.0)
MCV: 98.1 fL (ref 80.0–100.0)
Monocytes Absolute: 8.7 K/uL — ABNORMAL HIGH (ref 0.1–1.0)
Monocytes Relative: 16 %
Neutro Abs: 4.6 K/uL (ref 1.7–7.7)
Neutrophils Relative %: 8 %
Platelet Count: 67 K/uL — ABNORMAL LOW (ref 150–400)
RBC: 5.7 MIL/uL — ABNORMAL HIGH (ref 3.87–5.11)
RDW: 15.3 % (ref 11.5–15.5)
Smear Review: NORMAL
WBC Count: 54.8 K/uL (ref 4.0–10.5)
WBC Morphology: ABNORMAL
nRBC: 0 % (ref 0.0–0.2)

## 2022-08-18 LAB — LACTATE DEHYDROGENASE: LDH: 209 U/L — ABNORMAL HIGH (ref 98–192)

## 2022-08-18 NOTE — Progress Notes (Signed)
Hematology/Oncology Consult note [facility]  [phone] [phone]  Patient Care Team: Eustaquio Boyden, MD as PCP - General (Family Medicine) Etta Quill, MD as Attending Physician (Ophthalmology) Lelon Perla, DDS as Consulting Physician (Dentistry) Creig Hines, MD as Consulting Physician (Hematology and Oncology)   Name of the patient: Lori Shaw  [id number]  1945/05/09   Date of visit: 08/18/22  Diagnosis-history of CLL under observation Polycythemia  Chief complaint/ Reason for visit-routine follow-up of Rai stage 0 CLL  Heme/Onc history: patient is a 77 year old female with no significant past medical history other than low B12 and low vitamin D levels. Patient was recently noted to have leukocytosis with a white count of 13. Differential mainly showed lymphocytosis. Recent CBC from 07/03/2016 showed white count of 13, H&H of 14.7/44 and a platelet count of 207. Differential showed 63.5% lymphocytes. Peripheral flow cytometry showed CD5 positive, CD23 positive clonal B-cell population CLL/SLL phenotype.  Patient has not required any treatment for CLL so far  Interval history-patient currently feels well.  Appetite and weight have remained stable.  Denies any palpable lumps or bumps anywhere.  Denies any drenching night sweats.  ECOG PS- 1 Pain scale- 0   Review of systems- Review of Systems  Constitutional:  Negative for chills, fever, malaise/fatigue and weight loss.  HENT:  Negative for congestion, ear discharge and nosebleeds.   Eyes:  Negative for blurred vision.  Respiratory:  Negative for cough, hemoptysis, sputum production, shortness of breath and wheezing.   Cardiovascular:  Negative for chest pain, palpitations, orthopnea and claudication.  Gastrointestinal:  Negative for abdominal pain, blood in stool, constipation, diarrhea, heartburn, melena, nausea and vomiting.  Genitourinary:  Negative for  dysuria, flank pain, frequency, hematuria and urgency.  Musculoskeletal:  Negative for back pain, joint pain and myalgias.  Skin:  Negative for rash.  Neurological:  Negative for dizziness, tingling, focal weakness, seizures, weakness and headaches.  Endo/Heme/Allergies:  Does not bruise/bleed easily.  Psychiatric/Behavioral:  Negative for depression and suicidal ideas. The patient does not have insomnia.       Allergies  Allergen Reactions   Sulfa Antibiotics Nausea Only and Nausea And Vomiting     Past Medical History:  Diagnosis Date   B12 deficiency    Back pain    CLL (chronic lymphocytic leukemia) (HCC) 06/21/2016   Rai stage 0, dx 06/2016 (Dr Smith Robert)   HLD (hyperlipidemia)    Joint pain    Knee problem    Macular degeneration of both eyes    OSA on CPAP 2006, 04/2014   AHI of 23.8 and an RDI of 42.9, on CPAP 9 cm H2O (Dohmeier)   Osteopenia 08/2010   T -1.4 at L femur neck   Piriformis syndrome 12/14/2014   Vitamin D deficiency      Past Surgical History:  Procedure Laterality Date   ABDOMINAL HYSTERECTOMY  01/27/1983   for menometrorrhagia, one ovary remained   CHOLECYSTECTOMY N/A 08/01/2018   LAPAROSCOPIC CHOLECYSTECTOMY; for gallstone pancreatitis  Lady Gary, Victorino Dike, MD)   COLONOSCOPY  2012   no records available. per patient normal.    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, fitness  trainer  Tobacco Use   Smoking status: Former    Current packs/day: 0.00    Average packs/day: 2.0 packs/day for 20.0 years (40.0 ttl pk-yrs)    Types: Cigarettes    Start date: 01/26/1958    Quit date: 01/26/1978    Years since quitting: 44.5   Smokeless tobacco: Never  Vaping Use   Vaping status: Never Used   Substance and Sexual Activity   Alcohol use: Yes    Comment: occasionally   Drug use: Never   Sexual activity: Yes  Other Topics Concern   Not on file  Social History Narrative   Lives with daughter, 1 dog   Occupation: retired, has worked in Pharmacologist, also Optometrist   Edu: 1.5 yrs college   Activity: square dancing   Diet: good water, fruits/vegetables daily   Patient drinks two cups of caffeine daily.   Remarried.     Social Determinants of Health   Financial Resource Strain: Low Risk  (10/24/2019)   Overall Financial Resource Strain (CARDIA)    Difficulty of Paying Living Expenses: Not hard at all  Food Insecurity: No Food Insecurity (10/24/2019)   Hunger Vital Sign    Worried About Running Out of Food in the Last Year: Never true    Ran Out of Food in the Last Year: Never true  Transportation Needs: No Transportation Needs (10/24/2019)   PRAPARE - Administrator, Civil Service (Medical): No    Lack of Transportation (Non-Medical): No  Physical Activity: Inactive (10/24/2019)   Exercise Vital Sign    Days of Exercise per Week: 0 days    Minutes of Exercise per Session: 0 min  Stress: No Stress Concern Present (10/24/2019)   [employer] of Occupational Health - Occupational Stress Questionnaire    Feeling of Stress : Not at all  Social Connections: Not on file  Intimate Partner Violence: Not At Risk (10/24/2019)   Humiliation, Afraid, Rape, and Kick questionnaire    Fear of Current or Ex-Partner: No    Emotionally Abused: No    Physically Abused: No    Sexually Abused: No    Family History  Problem Relation Age of Onset   Hyperlipidemia Mother    CAD Mother        CABG   Hypertension Mother    Hyperlipidemia Father    CAD Father        MI   Sudden death Father    Cancer Sister        breast   Breast cancer Sister 35   Sleep apnea Brother    Sleep apnea Brother    Sleep apnea Brother    Arthritis Maternal Grandmother     Alzheimer's disease Other    Heart attack Other    Diabetes Neg Hx    Stroke Neg Hx      Current Outpatient Medications:    fluticasone (FLONASE) 50 MCG/ACT nasal spray, PLACE 2 SPRAYS INTO BOTH NOSTRILS DAILY AS NEEDED (Patient taking differently: Place 2 sprays into both nostrils as needed.), Disp: 16 mL, Rfl: 0   Multiple Vitamins-Minerals (PRESERVISION AREDS) TABS, Take 2 tablets by mouth daily., Disp: , Rfl:    Cholecalciferol (VITAMIN D) 125 MCG (5000 UT) CAPS, Take 1 capsule by mouth daily., Disp: 30 capsule, Rfl:    Cyanocobalamin (VITAMIN B-12) 2500 MCG SUBL, Place 1 tablet (2,500 mcg total) under the tongue daily., Disp: , Rfl: 0  Physical exam:  Vitals:   08/18/22 0912  BP: 136/71  Pulse: 68  Resp: 20  Temp: (!) 97.4 F (36.3 C)  SpO2: 97%  Weight: 178 lb 14.4 oz (81.1 kg)   Physical Exam Cardiovascular:     Rate and Rhythm: Normal rate and regular rhythm.     Heart sounds: Normal heart sounds.  Pulmonary:     Effort: Pulmonary effort is normal.     Breath sounds: Normal breath sounds.  Abdominal:     General: Bowel sounds are normal.     Palpations: Abdomen is soft.     Comments: No palpable hepatosplenomegaly  Lymphadenopathy:     Comments: No palpable cervical, supraclavicular, axillary or inguinal adenopathy    Skin:    General: Skin is warm and dry.  Neurological:     Mental Status: She is alert and oriented to person, place, and time.         Latest Ref Rng & Units 07/10/2022   10:50 AM  CMP  Glucose 70 - 99 mg/dL 92   BUN 8 - 23 mg/dL 11   Creatinine 9.62 - 1.00 mg/dL 9.52   Sodium 841 - 324 mmol/L 139   Potassium 3.5 - 5.1 mmol/L 4.7   Chloride 98 - 111 mmol/L 103   CO2 22 - 32 mmol/L 27   Calcium 8.9 - 10.3 mg/dL 9.1   Total Protein 6.5 - 8.1 g/dL 6.9   Total Bilirubin 0.3 - 1.2 mg/dL 1.0   Alkaline Phos 38 - 126 U/L 75   AST 15 - 41 U/L 26   ALT 0 - 44 U/L 20       Latest Ref Rng & Units 08/18/2022    8:25 AM  CBC  WBC 4.0 - 10.5  K/uL 54.8   Hemoglobin 12.0 - 15.0 g/dL 40.1   Hematocrit 02.7 - 46.0 % 55.9   Platelets 150 - 400 K/uL 67      Assessment and plan- Patient is a 77 y.o. female who is here for follow-up of following issues:  Rai stage 0 CLL: Patient's lymphocyte count was 24 about 7 months ago and is presently at 40 indicating a lymphocyte doubling time of about 7 to 8 months.  However her thrombocytopenia has worsened in the interim from 145 presently to 67.  She has no B symptoms and clinically she has no palpable lymphadenopathy or splenomegaly.  However given her worsening thrombocytopenia I would like to proceed with a bone marrow biopsy at this time.  I am also checking IGHV mutation and FISH panel for CLL today.  Polycythemia: Patient's hemoglobin historically has been around 16 but over the last 6 months he has gradually increased and is up to 18 today with a hematocrit of 55.9.  Typically when CLL progresses we would expect people to have worsening anemia and not polycythemia.  It is unclear if she has a simultaneous MPN going on.  Bone marrow biopsy will tell us more about it and I am checking JAK2, CALR, MPL and EPO levels today.  I am doing 1 session of phlebotomy today given that her hematocrit is more than 55  I will see her back in about 2 weeks time after bone marrow biopsy results are back and based on that I will decide about the need to get a PET scan as well.  Patient comprehends my plan well   Visit Diagnosis 1. CLL (chronic lymphocytic leukemia) (HCC)   2. Polycythemia      Dr. Owens Shark, MD, MPH [facility] [phone] 08/18/2022 1:07 PM

## 2022-08-19 LAB — ERYTHROPOIETIN: Erythropoietin: 4.1 m[IU]/mL (ref 2.6–18.5)

## 2022-08-25 ENCOUNTER — Other Ambulatory Visit (HOSPITAL_COMMUNITY): Payer: Self-pay | Admitting: Student

## 2022-08-25 DIAGNOSIS — D751 Secondary polycythemia: Secondary | ICD-10-CM

## 2022-08-25 NOTE — Progress Notes (Signed)
Patient for IR Bone Marrow Biopsy on Wed 08/26/2022, I called and spoke with the patient on the phone and gave pre-procedure instructions. Pt was made aware to be here at 7:30a, NPO after MN prior to procedure as well as driver post procedure/recovery/discharge. Pt stated understanding.  Called 08/25/2022

## 2022-08-26 ENCOUNTER — Other Ambulatory Visit: Payer: Self-pay

## 2022-08-26 ENCOUNTER — Encounter: Payer: Self-pay | Admitting: Radiology

## 2022-08-26 ENCOUNTER — Ambulatory Visit
Admission: RE | Admit: 2022-08-26 | Discharge: 2022-08-26 | Disposition: A | Discharge status: Home / Self Care | Payer: Medicare HMO | Source: Ambulatory Visit | Attending: Oncology | Admitting: Oncology

## 2022-08-26 DIAGNOSIS — E785 Hyperlipidemia, unspecified: Secondary | ICD-10-CM | POA: Insufficient documentation

## 2022-08-26 DIAGNOSIS — D751 Secondary polycythemia: Secondary | ICD-10-CM | POA: Insufficient documentation

## 2022-08-26 DIAGNOSIS — E538 Deficiency of other specified B group vitamins: Secondary | ICD-10-CM | POA: Insufficient documentation

## 2022-08-26 DIAGNOSIS — G4733 Obstructive sleep apnea (adult) (pediatric): Secondary | ICD-10-CM | POA: Insufficient documentation

## 2022-08-26 DIAGNOSIS — D696 Thrombocytopenia, unspecified: Secondary | ICD-10-CM | POA: Diagnosis not present

## 2022-08-26 DIAGNOSIS — D45 Polycythemia vera: Secondary | ICD-10-CM | POA: Diagnosis not present

## 2022-08-26 DIAGNOSIS — C9110 Chronic lymphocytic leukemia of B-cell type not having achieved remission: Secondary | ICD-10-CM | POA: Insufficient documentation

## 2022-08-26 HISTORY — PX: IR BONE MARROW BIOPSY & ASPIRATION: IMG5727

## 2022-08-26 LAB — CBC WITH DIFFERENTIAL/PLATELET
Abs Immature Granulocytes: 0.15 10*3/uL — ABNORMAL HIGH (ref 0.00–0.07)
Basophils Absolute: 0.4 10*3/uL — ABNORMAL HIGH (ref 0.0–0.1)
Basophils Relative: 1 %
Eosinophils Absolute: 0.8 10*3/uL — ABNORMAL HIGH (ref 0.0–0.5)
Eosinophils Relative: 2 %
HCT: 54.3 % — ABNORMAL HIGH (ref 36.0–46.0)
Hemoglobin: 17.6 g/dL — ABNORMAL HIGH (ref 12.0–15.0)
Immature Granulocytes: 0 %
Lymphocytes Relative: 76 %
Lymphs Abs: 39.6 10*3/uL — ABNORMAL HIGH (ref 0.7–4.0)
MCH: 31 pg (ref 26.0–34.0)
MCHC: 32.4 g/dL (ref 30.0–36.0)
MCV: 95.8 fL (ref 80.0–100.0)
Monocytes Absolute: 6.2 10*3/uL — ABNORMAL HIGH (ref 0.1–1.0)
Monocytes Relative: 12 %
Neutro Abs: 4.6 10*3/uL (ref 1.7–7.7)
Neutrophils Relative %: 9 %
Platelets: 93 10*3/uL — ABNORMAL LOW (ref 150–400)
RBC: 5.67 MIL/uL — ABNORMAL HIGH (ref 3.87–5.11)
RDW: 14.9 % (ref 11.5–15.5)
Smear Review: NORMAL
WBC Morphology: ABNORMAL
WBC: 51.7 10*3/uL (ref 4.0–10.5)
nRBC: 0 % (ref 0.0–0.2)

## 2022-08-26 MED ORDER — FENTANYL CITRATE (PF) 100 MCG/2ML IJ SOLN
INTRAMUSCULAR | Status: AC | PRN
Start: 2022-08-26 — End: 2022-08-26
  Administered 2022-08-26: 50 ug via INTRAVENOUS

## 2022-08-26 MED ORDER — LIDOCAINE 1 % OPTIME INJ - NO CHARGE
10.0000 mL | Freq: Once | INTRAMUSCULAR | Status: AC
  Administered 2022-08-26: 10 mL via SUBCUTANEOUS
  Filled 2022-08-26: qty 10

## 2022-08-26 MED ORDER — SODIUM CHLORIDE 0.9 % IV SOLN: INTRAVENOUS | Status: DC

## 2022-08-26 MED ORDER — MIDAZOLAM HCL 2 MG/2ML IJ SOLN
INTRAMUSCULAR | Status: AC
  Filled 2022-08-26: qty 2

## 2022-08-26 MED ORDER — HEPARIN SOD (PORK) LOCK FLUSH 100 UNIT/ML IV SOLN
INTRAVENOUS | Status: AC
  Filled 2022-08-26: qty 5

## 2022-08-26 MED ORDER — MIDAZOLAM HCL 2 MG/2ML IJ SOLN
INTRAMUSCULAR | Status: AC | PRN
Start: 2022-08-26 — End: 2022-08-26
  Administered 2022-08-26: 1 mg via INTRAVENOUS

## 2022-08-26 MED ORDER — FENTANYL CITRATE (PF) 100 MCG/2ML IJ SOLN
INTRAMUSCULAR | Status: AC
  Filled 2022-08-26: qty 2

## 2022-08-26 NOTE — H&P (Signed)
Chief Complaint: Patient was seen in consultation today for chronic lymphocytic leukemia  Referring Physician(s): Rao,Archana C  Supervising Physician: Pernell Dupre  Patient Status: [facility] - Out-pt  History of Present Illness: Lori Shaw is a 77 y.o. female with PMH significant for B12 deficiency, hyperlipidemia, and OSA being seen today in relation her known CLL initially diagnosed June of 2018. The patient has been under the care of Dr Smith Robert from Oncology service. Patient has had a recent increase in her lymphocyte count and worsening of her polycythemia and thrombocytopenia. Patient has been referred to IR for image-guided bone marrow biopsy to further evaluate her CLL.  Past Medical History:  Diagnosis Date   B12 deficiency    Back pain    CLL (chronic lymphocytic leukemia) (HCC) 06/21/2016   Rai stage 0, dx 06/2016 (Dr Smith Robert)   HLD (hyperlipidemia)    Joint pain    Knee problem    Macular degeneration of both eyes    OSA on CPAP 2006, 04/2014   AHI of 23.8 and an RDI of 42.9, on CPAP 9 cm H2O (Dohmeier)   Osteopenia 08/2010   T -1.4 at L femur neck   Piriformis syndrome 12/14/2014   Vitamin D deficiency     Past Surgical History:  Procedure Laterality Date   ABDOMINAL HYSTERECTOMY  01/27/1983   for menometrorrhagia, one ovary remained   CHOLECYSTECTOMY N/A 08/01/2018   LAPAROSCOPIC CHOLECYSTECTOMY; for gallstone pancreatitis  Lady Gary, Victorino Dike, MD)   COLONOSCOPY  2012   no records available. per patient normal.    dexa  08/2010   osteopenia   TONSILLECTOMY  01/26/1973    Allergies: Sulfa antibiotics  Medications: Prior to Admission medications   Medication Sig Start Date End Date Taking? Authorizing Provider  Cholecalciferol (VITAMIN D) 125 MCG (5000 UT) CAPS Take 1 capsule by mouth daily. 10/31/19   Eustaquio Boyden, MD  Cyanocobalamin (VITAMIN B-12) 2500 MCG SUBL Place 1 tablet (2,500 mcg total) under the tongue daily. 02/16/17   Eustaquio Boyden, MD  fluticasone (FLONASE) 50 MCG/ACT nasal spray PLACE 2 SPRAYS INTO BOTH NOSTRILS DAILY AS NEEDED Patient taking differently: Place 2 sprays into both nostrils as needed. 02/09/21   Eustaquio Boyden, MD  Multiple Vitamins-Minerals (PRESERVISION AREDS) TABS Take 2 tablets by mouth daily.    [provider]     Family History  Problem Relation Age of Onset   Hyperlipidemia Mother    CAD Mother        CABG   Hypertension Mother    Hyperlipidemia Father    CAD Father        MI   Sudden death Father    Cancer Sister        breast   Breast cancer Sister 69   Sleep apnea Brother    Sleep apnea Brother    Sleep apnea Brother    Arthritis Maternal Grandmother    Alzheimer's disease Other    Heart attack Other    Diabetes Neg Hx    Stroke Neg Hx     Social History   Socioeconomic History   Marital status: Married    Spouse name: Pensions consultant   Number of children: 2   Years of education: 1.5   Highest education level: Not on file  Occupational History   Occupation: retired, 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, fitness trainer  Tobacco Use   Smoking status: Former    Current packs/day: 0.00    Average packs/day: 2.0 packs/day for 20.0 years (40.0  ttl pk-yrs)    Types: Cigarettes    Start date: 01/26/1958    Quit date: 01/26/1978    Years since quitting: 44.6   Smokeless tobacco: Never  Vaping Use   Vaping status: Never Used  Substance and Sexual Activity   Alcohol use: Yes    Comment: occasionally   Drug use: Never   Sexual activity: Yes  Other Topics Concern   Not on file  Social History Narrative   Lives with daughter, 1 dog   Occupation: retired, has worked in Pharmacologist, also Optometrist   Edu: 1.5 yrs college   Activity: square dancing   Diet: good water, fruits/vegetables daily   Patient drinks two cups of caffeine daily.   Remarried.     Social Determinants of Health   Financial Resource Strain: Low Risk  (10/24/2019)   Overall Financial Resource  Strain (CARDIA)    Difficulty of Paying Living Expenses: Not hard at all  Food Insecurity: No Food Insecurity (10/24/2019)   Hunger Vital Sign    Worried About Running Out of Food in the Last Year: Never true    Ran Out of Food in the Last Year: Never true  Transportation Needs: No Transportation Needs (10/24/2019)   PRAPARE - Administrator, Civil Service (Medical): No    Lack of Transportation (Non-Medical): No  Physical Activity: Inactive (10/24/2019)   Exercise Vital Sign    Days of Exercise per Week: 0 days    Minutes of Exercise per Session: 0 min  Stress: No Stress Concern Present (10/24/2019)   [employer] of Occupational Health - Occupational Stress Questionnaire    Feeling of Stress : Not at all  Social Connections: Not on file    Code Status: Full code  Review of Systems: A 12 point ROS discussed and pertinent positives are indicated in the HPI above.  All other systems are negative.  Review of Systems  Constitutional:  Negative for chills and fever.  Respiratory:  Negative for chest tightness and shortness of breath.   Cardiovascular:  Negative for chest pain and leg swelling.  Gastrointestinal:  Negative for abdominal pain, diarrhea, nausea and vomiting.  Neurological:  Negative for dizziness and headaches.  Psychiatric/Behavioral:  Negative for confusion.     Vital Signs: BP (!) 147/72   Pulse 66   Temp 97.7 F (36.5 C)   Resp (!) 22   Ht 5\' 5"  (1.651 m)   Wt 175 lb (79.4 kg)   SpO2 97%   BMI 29.12 kg/m    Physical Exam Vitals reviewed.  Constitutional:      General: She is not in acute distress.    Appearance: She is not ill-appearing.  Cardiovascular:     Rate and Rhythm: Normal rate and regular rhythm.     Pulses: Normal pulses.     Heart sounds: Normal heart sounds.  Pulmonary:     Effort: Pulmonary effort is normal.     Breath sounds: Normal breath sounds.  Abdominal:     Palpations: Abdomen is soft.     Tenderness: There is  no abdominal tenderness.  Musculoskeletal:     Right lower leg: No edema.     Left lower leg: No edema.  Skin:    General: Skin is warm and dry.  Neurological:     Mental Status: She is alert and oriented to person, place, and time.  Psychiatric:        Mood and Affect: Mood normal.  Behavior: Behavior normal.        Thought Content: Thought content normal.        Judgment: Judgment normal.     Imaging: No results found.  Labs:  CBC: Recent Labs    01/09/22 1055 07/10/22 1050 08/18/22 0825  WBC 33.8* 46.5* 54.8*  HGB 16.3* 17.6* 18.0*  HCT 48.8* 53.8* 55.9*  PLT 142* 90* 67*    COAGS: No results for input(s): "INR", "APTT" in the last 8760 hours.  BMP: Recent Labs    01/09/22 1055 07/10/22 1050  NA 140 139  K 4.3 4.7  CL 106 103  CO2 25 27  GLUCOSE 110* 92  BUN 15 11  CALCIUM 8.8* 9.1  CREATININE 1.07* 0.86  GFRNONAA 54* >60    LIVER FUNCTION TESTS: Recent Labs    01/09/22 1055 07/10/22 1050  BILITOT 0.7 1.0  AST 25 26  ALT 19 20  ALKPHOS 65 75  PROT 6.5 6.9  ALBUMIN 4.1 4.4    TUMOR MARKERS: No results for input(s): "AFPTM", "CEA", "CA199", "CHROMGRNA" in the last 8760 hours.  Assessment and Plan:  Lori Shaw is a 77 yo female being seen today in relation to her known CLL. Patient has had worsening of her polycythemia and thrombocytopenia. Dr Smith Robert has referred patient ot IR for image-guided biopsy. Case has been reviewed with Dr Juliette Alcide and is scheduled to proceed on 08/26/22. Patient presents today in her usual state of health and is NPO. Pre-procedural CBC is pending.  Risks and benefits of image-guided bone marrow biopsy was discussed with the patient and/or patient's family including, but not limited to bleeding, infection, damage to adjacent structures or low yield requiring additional tests.  All of the questions were answered and there is agreement to proceed.  Consent signed and in chart.   Thank you for this interesting  consult.  I greatly enjoyed meeting [address] and look forward to participating in their care.  A copy of this report was sent to the requesting provider on this date.  Electronically Signed: Kennieth Francois, PA-C 08/26/2022, 8:22 AM   I spent a total of  15 Minutes   in face to face in clinical consultation, greater than 50% of which was counseling/coordinating care for chronic lymphocytic leukemia.

## 2022-08-26 NOTE — Procedures (Signed)
Interventional Radiology Procedure Note  Date of Procedure: 08/26/2022  Procedure: BMBx  Findings:  1. BMBx    Complications: No immediate complications noted.   Estimated Blood Loss: minimal  Follow-up and Recommendations: 1. Bedrest 1 hour    Olive Bass, MD  Vascular & Interventional Radiology  08/26/2022 9:34 AM

## 2022-08-26 NOTE — Progress Notes (Signed)
Received call from lab regarding critical WBC results.  Called Dr. Juliette Alcide and let him know WBC result of 51.7 and read him previous WBC results from earlier in July and June 2024.  No new orders.

## 2022-08-28 LAB — SURGICAL PATHOLOGY

## 2022-09-03 ENCOUNTER — Encounter (HOSPITAL_COMMUNITY): Payer: Self-pay | Admitting: Oncology

## 2022-09-08 ENCOUNTER — Ambulatory Visit: Payer: Medicare HMO | Admitting: Oncology

## 2022-09-15 ENCOUNTER — Inpatient Hospital Stay: Payer: Medicare HMO

## 2022-09-15 ENCOUNTER — Telehealth: Payer: Self-pay | Admitting: *Deleted

## 2022-09-15 ENCOUNTER — Encounter: Payer: Self-pay | Admitting: Oncology

## 2022-09-15 ENCOUNTER — Inpatient Hospital Stay: Payer: Medicare HMO | Attending: Oncology | Admitting: Oncology

## 2022-09-15 VITALS — BP 139/77 | HR 71 | Temp 96.4°F | Resp 18 | Ht 65.0 in | Wt 173.4 lb

## 2022-09-15 DIAGNOSIS — C9110 Chronic lymphocytic leukemia of B-cell type not having achieved remission: Secondary | ICD-10-CM | POA: Diagnosis not present

## 2022-09-15 DIAGNOSIS — D696 Thrombocytopenia, unspecified: Secondary | ICD-10-CM | POA: Insufficient documentation

## 2022-09-15 DIAGNOSIS — Z87891 Personal history of nicotine dependence: Secondary | ICD-10-CM | POA: Diagnosis not present

## 2022-09-15 DIAGNOSIS — E538 Deficiency of other specified B group vitamins: Secondary | ICD-10-CM | POA: Diagnosis not present

## 2022-09-15 LAB — CBC WITH DIFFERENTIAL/PLATELET
Abs Immature Granulocytes: 0 10*3/uL (ref 0.00–0.07)
Basophils Absolute: 0.5 10*3/uL — ABNORMAL HIGH (ref 0.0–0.1)
Basophils Relative: 1 %
Eosinophils Absolute: 0 10*3/uL (ref 0.0–0.5)
Eosinophils Relative: 0 %
HCT: 54.4 % — ABNORMAL HIGH (ref 36.0–46.0)
Hemoglobin: 17.6 g/dL — ABNORMAL HIGH (ref 12.0–15.0)
Lymphocytes Relative: 83 %
Lymphs Abs: 43.2 10*3/uL — ABNORMAL HIGH (ref 0.7–4.0)
MCH: 31 pg (ref 26.0–34.0)
MCHC: 32.4 g/dL (ref 30.0–36.0)
MCV: 95.8 fL (ref 80.0–100.0)
Monocytes Absolute: 3.6 10*3/uL — ABNORMAL HIGH (ref 0.1–1.0)
Monocytes Relative: 7 %
Neutro Abs: 4.7 10*3/uL (ref 1.7–7.7)
Neutrophils Relative %: 9 %
Platelets: 58 10*3/uL — ABNORMAL LOW (ref 150–400)
RBC: 5.68 MIL/uL — ABNORMAL HIGH (ref 3.87–5.11)
RDW: 14.6 % (ref 11.5–15.5)
Smear Review: NORMAL
WBC Morphology: ABNORMAL
WBC: 52 10*3/uL (ref 4.0–10.5)
nRBC: 0 % (ref 0.0–0.2)

## 2022-09-15 LAB — LACTATE DEHYDROGENASE: LDH: 252 U/L — ABNORMAL HIGH (ref 98–192)

## 2022-09-15 LAB — HEPATITIS B SURFACE ANTIGEN: Hepatitis B Surface Ag: NONREACTIVE

## 2022-09-15 NOTE — Progress Notes (Signed)
Hematology/Oncology Consult note [facility]  [phone] [phone]  Patient Care Team: Eustaquio Boyden, MD as PCP - General (Family Medicine) Etta Quill, MD as Attending Physician (Ophthalmology) Lelon Perla, DDS as Consulting Physician (Dentistry) Creig Hines, MD as Consulting Physician (Hematology and Oncology)   Name of the patient: Lori Shaw  [id number]  [birth year]   Date of visit: 09/15/22  Diagnosis-T p53 mutated CLL  Chief complaint/ Reason for visit-discuss bone marrow biopsy results and further management  Heme/Onc history:  patient is a 77 year old female with no significant past medical history other than low B12 and low vitamin D levels. Patient was recently noted to have leukocytosis with a white count of 13. Differential mainly showed lymphocytosis. Recent CBC from 07/03/2016 showed white count of 13, H&H of 14.7/44 and a platelet count of 207. Differential showed 63.5% lymphocytes. Peripheral flow cytometry showed CD5 positive, CD23 positive clonal B-cell population CLL/SLL phenotype.  Patient has not required any treatment for CLL so far   There has been a progressiveReason leukocytosis/lymphocytosis since June 2023.  Absolute lymphocyte doubling time remains about 7 to 8 months.  Also patient noted to have progressive thrombocytopenia since December 2023.  No B symptoms. Patient has mutated IgH V.  FISH for CLL showed 38.5% of nuclei positive for T p53. Patient also noted to have polycythemia with hemoglobin between 17-18 since June 2024.  JAK2, CALR, MPL mutation negative.  EPO levels normal.  Patient is a non-smoker.  No overt evidence of myeloproliferative neoplasm noted on bone marrow biopsy.  Interval history-patient overall feels well and denies any specific complaints at this time.  Although she has lost weight she states that this is intentional.  ECOG PS- 1 Pain scale- 0   Review of systems-  Review of Systems  Constitutional:  Negative for chills, fever, malaise/fatigue and weight loss.  HENT:  Negative for congestion, ear discharge and nosebleeds.   Eyes:  Negative for blurred vision.  Respiratory:  Negative for cough, hemoptysis, sputum production, shortness of breath and wheezing.   Cardiovascular:  Negative for chest pain, palpitations, orthopnea and claudication.  Gastrointestinal:  Negative for abdominal pain, blood in stool, constipation, diarrhea, heartburn, melena, nausea and vomiting.  Genitourinary:  Negative for dysuria, flank pain, frequency, hematuria and urgency.  Musculoskeletal:  Negative for back pain, joint pain and myalgias.  Skin:  Negative for rash.  Neurological:  Negative for dizziness, tingling, focal weakness, seizures, weakness and headaches.  Endo/Heme/Allergies:  Does not bruise/bleed easily.  Psychiatric/Behavioral:  Negative for depression and suicidal ideas. The patient does not have insomnia.       Allergies  Allergen Reactions   Sulfa Antibiotics Nausea Only and Nausea And Vomiting     Past Medical History:  Diagnosis Date   B12 deficiency    Back pain    CLL (chronic lymphocytic leukemia) (HCC) 06/21/2016   Rai stage 0, dx 06/2016 (Dr Smith Robert)   HLD (hyperlipidemia)    Joint pain    Knee problem    Macular degeneration of both eyes    OSA on CPAP 2006, 04/2014   AHI of 23.8 and an RDI of 42.9, on CPAP 9 cm H2O (Dohmeier)   Osteopenia 08/2010   T -1.4 at L femur neck   Piriformis syndrome 12/14/2014   Vitamin D deficiency      Past Surgical History:  Procedure Laterality Date   ABDOMINAL HYSTERECTOMY  01/27/1983   for menometrorrhagia, one ovary remained  CHOLECYSTECTOMY N/A 08/01/2018   LAPAROSCOPIC CHOLECYSTECTOMY; for gallstone pancreatitis  Lady Gary, Victorino Dike, MD)   COLONOSCOPY  2012   no records available. per patient normal.    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, fitness trainer  Tobacco Use   Smoking status: Former    Current packs/day: 0.00    Average packs/day: 2.0 packs/day for 20.0 years (40.0 ttl pk-yrs)    Types: Cigarettes    Start date: 01/26/1958    Quit date: 01/26/1978    Years since quitting: 44.6   Smokeless tobacco: Never  Vaping Use   Vaping status: Never Used  Substance and Sexual Activity   Alcohol use: Yes    Comment: occasionally   Drug use: Never   Sexual activity: Yes  Other Topics Concern   Not on file  Social History Narrative   Lives with daughter, 1 dog   Occupation: retired, has worked in Pharmacologist, also Optometrist   Edu: 1.5 yrs college   Activity: square dancing   Diet: good water, fruits/vegetables daily   Patient drinks two cups of caffeine daily.   Remarried.     Social Determinants of Health   Financial Resource Strain: Low Risk  (10/24/2019)   Overall Financial Resource Strain (CARDIA)    Difficulty of Paying Living Expenses: Not hard at all  Food Insecurity: No Food Insecurity (10/24/2019)   Hunger Vital Sign    Worried About Running Out of Food in the Last Year: Never true    Ran Out of Food in the Last Year: Never true  Transportation Needs: No Transportation Needs (10/24/2019)   PRAPARE - Administrator, Civil Service (Medical): No    Lack of Transportation (Non-Medical): No  Physical Activity: Inactive (10/24/2019)   Exercise Vital Sign    Days of Exercise per Week: 0 days    Minutes of Exercise per Session: 0 min  Stress: No Stress Concern Present (10/24/2019)   [employer] of Occupational Health - Occupational Stress Questionnaire    Feeling of Stress : Not at all  Social Connections: Not on file   Intimate Partner Violence: Not At Risk (10/24/2019)   Humiliation, Afraid, Rape, and Kick questionnaire    Fear of Current or Ex-Partner: No    Emotionally Abused: No    Physically Abused: No    Sexually Abused: No    Family History  Problem Relation Age of Onset   Hyperlipidemia Mother    CAD Mother        CABG   Hypertension Mother    Hyperlipidemia Father    CAD Father        MI   Sudden death Father    Cancer Sister        breast   Breast cancer Sister 35   Sleep apnea Brother    Sleep apnea Brother    Sleep apnea Brother    Arthritis Maternal Grandmother    Alzheimer's disease Other    Heart attack Other    Diabetes Neg Hx    Stroke Neg Hx      Current Outpatient Medications:  fluticasone (FLONASE) 50 MCG/ACT nasal spray, PLACE 2 SPRAYS INTO BOTH NOSTRILS DAILY AS NEEDED (Patient taking differently: Place 2 sprays into both nostrils as needed.), Disp: 16 mL, Rfl: 0   Multiple Vitamins-Minerals (PRESERVISION AREDS) TABS, Take 2 tablets by mouth daily., Disp: , Rfl:    Cholecalciferol (VITAMIN D) 125 MCG (5000 UT) CAPS, Take 1 capsule by mouth daily. (Patient not taking: Reported on 09/15/2022), Disp: 30 capsule, Rfl:    Cyanocobalamin (VITAMIN B-12) 2500 MCG SUBL, Place 1 tablet (2,500 mcg total) under the tongue daily. (Patient not taking: Reported on 09/15/2022), Disp: , Rfl: 0  Physical exam:  Vitals:   09/15/22 1130  BP: 139/77  Pulse: 71  Resp: 18  Temp: (!) 96.4 F (35.8 C)  TempSrc: Tympanic  SpO2: 99%  Weight: 173 lb 6.4 oz (78.7 kg)  Height: 5\' 5"  (1.651 m)   Physical Exam Cardiovascular:     Rate and Rhythm: Normal rate and regular rhythm.     Heart sounds: Normal heart sounds.  Pulmonary:     Effort: Pulmonary effort is normal.     Breath sounds: Normal breath sounds.  Abdominal:     General: Bowel sounds are normal.     Palpations: Abdomen is soft.  Lymphadenopathy:     Comments: No palpable cervical, supraclavicular, axillary or inguinal  adenopathy    Skin:    General: Skin is warm and dry.  Neurological:     Mental Status: She is alert and oriented to person, place, and time.         Latest Ref Rng & Units 07/10/2022   10:50 AM  CMP  Glucose 70 - 99 mg/dL 92   BUN 8 - 23 mg/dL 11   Creatinine 4.09 - 1.00 mg/dL 8.11   Sodium 914 - 782 mmol/L 139   Potassium 3.5 - 5.1 mmol/L 4.7   Chloride 98 - 111 mmol/L 103   CO2 22 - 32 mmol/L 27   Calcium 8.9 - 10.3 mg/dL 9.1   Total Protein 6.5 - 8.1 g/dL 6.9   Total Bilirubin 0.3 - 1.2 mg/dL 1.0   Alkaline Phos 38 - 126 U/L 75   AST 15 - 41 U/L 26   ALT 0 - 44 U/L 20       Latest Ref Rng & Units 08/26/2022    8:21 AM  CBC  WBC 4.0 - 10.5 K/uL 51.7   Hemoglobin 12.0 - 15.0 g/dL 95.6   Hematocrit 21.3 - 46.0 % 54.3   Platelets 150 - 400 K/uL 93     No images are attached to the encounter.  IR BONE MARROW BIOPSY & ASPIRATION  Result Date: 08/26/2022 INDICATION: CLL (chronic lymphocytic leukemia), polycthemia EXAM: Bone marrow aspiration core biopsy using fluoroscopic guidance MEDICATIONS: None. ANESTHESIA/SEDATION: Moderate (conscious) sedation was employed during this procedure. A total of Versed 1 mg and Fentanyl 50 mcg was administered intravenously. Moderate Sedation Time: 10 minutes. The patient's level of consciousness and vital signs were monitored continuously by radiology nursing throughout the procedure under my direct supervision. FLUOROSCOPY TIME:  Fluoroscopy Time: 0.7 minutes (7 mGy) COMPLICATIONS: None immediate. PROCEDURE: Informed written consent was obtained from the patient after a thorough discussion of the procedural risks, benefits and alternatives. All questions were addressed. Maximal Sterile Barrier Technique was utilized including caps, mask, sterile gowns, sterile gloves, sterile drape, hand hygiene and skin antiseptic. A timeout was performed prior to the initiation of the procedure. The patient was placed prone on the exam table. Limited  fluoroscopy of the pelvis was performed for planning purposes. Skin entry site was marked, and the overlying skin was prepped and draped in the standard sterile fashion. Local analgesia was obtained with 1% lidocaine. Using fluoroscopic guidance, an 11 gauge needle was advanced just deep to the cortex of the right posterior ilium. Subsequently, bone marrow aspiration and core biopsy were performed. Specimens were submitted to lab/pathology for handling. Hemostasis was achieved with manual pressure, and a clean dressing was placed. The patient tolerated the procedure well without immediate complication. IMPRESSION: Successful bone marrow aspiration core biopsy of the right posterior ilium. Electronically Signed   By: Olive Bass M.D.   On: 08/26/2022 13:30     Assessment and plan- Patient is a 77 y.o. female with T p53 mutated CLL here to discuss further management  Patient's lymphocyte doubling time remains around 7 to 8 months however patient has had worsening thrombocytopeniaOver the last 8 months.  Her platelet count was 142 in December 2023 then went down to 90 in June 2024.  Subsequently it was 67 and 93 on 2 separate occasions and I am repeating her CBC at this time.  CLL FISH panel shows T p53 mutation which indicates very high risk phenotype.  Clinically patient has no B symptoms or adenopathy and I would like to get a PET scan at this time.  She did have a bone marrow biopsy which shows bone marrow involvement with CLL 62%.  There was some atypical morphology noted in megakaryocytes possible underlying myeloproliferative neoplasm given the history of erythrocytosis was commented.  However patient has a negative JAK2, CALR and MPL mutation within normal EPO level.  I will discuss her bone marrow biopsy with Dr. Laureen Ochs from hematopathology but I do not have any convincing evidence of polycythemia vera at this time.  I will have the patient's start on low-dose aspirin and see her back after PET scan  results are back to discuss potential treatment for CLL especially given her worsening thrombocytopenia. Given that she has a TP 53 mutation, I would favor continuous acalabrutinib or Zanubrutinib until progression or toxicity as compared to fixed duration venetoclax plus obinutuzumab.  I am checking CBC with differential, LDH, hepatitis testing as well as intelligent myeloid panel today   Visit Diagnosis 1. CLL (chronic lymphocytic leukemia) (HCC)      Dr. Owens Shark, MD, MPH [facility] [phone] 09/15/2022 1:01 PM

## 2022-09-15 NOTE — Telephone Encounter (Signed)
I called the pt and told her that Dr. Smith Robert said-can you please tell her to take low-dose aspirin 81 mg given her high hemoglobin? Also her platelet counts came back at 58. It would be desirable to get the PET scan sooner than later whenever she gets back from her trip . Pt is agreeable to take 1 (81 mg ) daily.

## 2022-09-16 LAB — HEPATITIS B CORE ANTIBODY, TOTAL: Hep B Core Total Ab: NONREACTIVE

## 2022-09-16 LAB — HEPATITIS B SURFACE ANTIBODY, QUANTITATIVE: Hep B S AB Quant (Post): <3.5 m[IU]/mL — ABNORMAL LOW

## 2022-09-22 ENCOUNTER — Other Ambulatory Visit: Payer: Medicare HMO

## 2022-10-05 ENCOUNTER — Ambulatory Visit
Admission: RE | Admit: 2022-10-05 | Discharge: 2022-10-05 | Disposition: A | Discharge status: Home / Self Care | Payer: Medicare HMO | Source: Ambulatory Visit | Attending: Oncology | Admitting: Oncology

## 2022-10-05 DIAGNOSIS — C9110 Chronic lymphocytic leukemia of B-cell type not having achieved remission: Secondary | ICD-10-CM | POA: Insufficient documentation

## 2022-10-05 DIAGNOSIS — C9590 Leukemia, unspecified not having achieved remission: Secondary | ICD-10-CM | POA: Diagnosis not present

## 2022-10-05 LAB — GLUCOSE, CAPILLARY: Glucose-Capillary: 86 mg/dL (ref 70–99)

## 2022-10-05 MED ORDER — FLUDEOXYGLUCOSE F - 18 (FDG) INJECTION
9.0000 | Freq: Once | INTRAVENOUS | Status: AC | PRN
  Administered 2022-10-05: 9.65 via INTRAVENOUS

## 2022-10-06 ENCOUNTER — Other Ambulatory Visit: Payer: Medicare HMO

## 2022-10-12 ENCOUNTER — Other Ambulatory Visit: Payer: Self-pay | Admitting: *Deleted

## 2022-10-12 DIAGNOSIS — D751 Secondary polycythemia: Secondary | ICD-10-CM

## 2022-10-12 DIAGNOSIS — C9110 Chronic lymphocytic leukemia of B-cell type not having achieved remission: Secondary | ICD-10-CM

## 2022-10-13 ENCOUNTER — Telehealth: Payer: Self-pay

## 2022-10-13 ENCOUNTER — Inpatient Hospital Stay: Payer: Medicare HMO | Attending: Oncology

## 2022-10-13 ENCOUNTER — Inpatient Hospital Stay: Payer: Medicare HMO | Admitting: Oncology

## 2022-10-13 ENCOUNTER — Other Ambulatory Visit (HOSPITAL_COMMUNITY): Payer: Self-pay

## 2022-10-13 ENCOUNTER — Encounter: Payer: Self-pay | Admitting: Oncology

## 2022-10-13 ENCOUNTER — Inpatient Hospital Stay: Payer: Medicare HMO | Admitting: Pharmacist

## 2022-10-13 VITALS — BP 126/63 | HR 70 | Temp 96.4°F | Resp 18 | Wt 172.9 lb

## 2022-10-13 DIAGNOSIS — Z87891 Personal history of nicotine dependence: Secondary | ICD-10-CM | POA: Insufficient documentation

## 2022-10-13 DIAGNOSIS — M7989 Other specified soft tissue disorders: Secondary | ICD-10-CM | POA: Insufficient documentation

## 2022-10-13 DIAGNOSIS — Z803 Family history of malignant neoplasm of breast: Secondary | ICD-10-CM | POA: Insufficient documentation

## 2022-10-13 DIAGNOSIS — C9110 Chronic lymphocytic leukemia of B-cell type not having achieved remission: Secondary | ICD-10-CM

## 2022-10-13 DIAGNOSIS — Z7189 Other specified counseling: Secondary | ICD-10-CM

## 2022-10-13 DIAGNOSIS — D751 Secondary polycythemia: Secondary | ICD-10-CM

## 2022-10-13 LAB — CBC WITH DIFFERENTIAL/PLATELET
Abs Immature Granulocytes: 0.24 10*3/uL — ABNORMAL HIGH (ref 0.00–0.07)
Basophils Absolute: 0.7 10*3/uL — ABNORMAL HIGH (ref 0.0–0.1)
Basophils Relative: 1 %
Eosinophils Absolute: 0.7 10*3/uL — ABNORMAL HIGH (ref 0.0–0.5)
Eosinophils Relative: 1 %
HCT: 54.6 % — ABNORMAL HIGH (ref 36.0–46.0)
Hemoglobin: 17.5 g/dL — ABNORMAL HIGH (ref 12.0–15.0)
Immature Granulocytes: 0 %
Lymphocytes Relative: 68 %
Lymphs Abs: 44.6 10*3/uL — ABNORMAL HIGH (ref 0.7–4.0)
MCH: 30.7 pg (ref 26.0–34.0)
MCHC: 32.1 g/dL (ref 30.0–36.0)
MCV: 95.8 fL (ref 80.0–100.0)
Monocytes Absolute: 15.3 10*3/uL — ABNORMAL HIGH (ref 0.1–1.0)
Monocytes Relative: 23 %
Neutro Abs: 4.5 10*3/uL (ref 1.7–7.7)
Neutrophils Relative %: 7 %
Platelets: 80 10*3/uL — ABNORMAL LOW (ref 150–400)
RBC: 5.7 MIL/uL — ABNORMAL HIGH (ref 3.87–5.11)
RDW: 15.2 % (ref 11.5–15.5)
Smear Review: NORMAL
WBC: 66.1 10*3/uL (ref 4.0–10.5)
nRBC: 0 % (ref 0.0–0.2)

## 2022-10-13 NOTE — Telephone Encounter (Signed)
Oral Oncology Patient Advocate Encounter  Was successful in securing patient a $8,000.00 grant from [employer] to provide copayment coverage for Brukinsa.  This will keep the out of pocket expense at $0.     [employer] ID: [id number]   The billing information is as follows and has been shared with [facility].    RxBin: [id number] PCN: [id number] Member ID: [id number] Group ID: [id number] Dates of Eligibility: 09/13/22 through 09/12/23  Fund:  Chronic Lymphocytic Leukemia   Ardeen Fillers, CPhT Oncology Pharmacy Patient Advocate  [facility]  [phone] (phone) [phone] (fax) 10/13/2022 3:55 PM

## 2022-10-13 NOTE — Progress Notes (Signed)
Hematology/Oncology Consult note [facility]  Telephone:([phone] [phone]  Patient Care Team: Eustaquio Boyden, MD as PCP - General (Family Medicine) Etta Quill, MD as Attending Physician (Ophthalmology) Lelon Perla, DDS as Consulting Physician (Dentistry) Creig Hines, MD as Consulting Physician (Hematology and Oncology)   Name of the patient: Lori Shaw  [id number]  03-Oct-1945   Date of visit: 10/13/22  Diagnosis-Rai stage IV CLL  Chief complaint/ Reason for visit-discuss further management of CLL  Heme/Onc history: patient is a 77 year old female with no significant past medical history other than low B12 and low vitamin D levels. Patient was recently noted to have leukocytosis with a white count of 13. Differential mainly showed lymphocytosis. Recent CBC from 07/03/2016 showed white count of 13, H&H of 14.7/44 and a platelet count of 207. Differential showed 63.5% lymphocytes. Peripheral flow cytometry showed CD5 positive, CD23 positive clonal B-cell population CLL/SLL phenotype.  Patient has not required any treatment for CLL so far    There has been a progressiveReason leukocytosis/lymphocytosis since June 2023.  Absolute lymphocyte doubling time remains about 7 to 8 months.  Also patient noted to have progressive thrombocytopenia since December 2023.  No B symptoms. Patient has mutated IgH V.  FISH for CLL showed 38.5% of nuclei positive for T p53. Patient also noted to have polycythemia with hemoglobin between 17-18 since June 2024.  JAK2, CALR, MPL mutation negative.  EPO levels normal.  Patient is a non-smoker.  No overt evidence of myeloproliferative neoplasm noted on bone marrow biopsy.62% bone marrow involvement with CLL  Interval history-patient is doing well overall.  Denies any overt fatigue or changes in her appetite and weight.  She has noticed a firm swelling in her dorsal surface of the right foot which has not  been bothering her.  ECOG PS- 1 Pain scale- 0   Review of systems- Review of Systems  Constitutional:  Negative for chills, fever, malaise/fatigue and weight loss.  HENT:  Negative for congestion, ear discharge and nosebleeds.   Eyes:  Negative for blurred vision.  Respiratory:  Negative for cough, hemoptysis, sputum production, shortness of breath and wheezing.   Cardiovascular:  Negative for chest pain, palpitations, orthopnea and claudication.  Gastrointestinal:  Negative for abdominal pain, blood in stool, constipation, diarrhea, heartburn, melena, nausea and vomiting.  Genitourinary:  Negative for dysuria, flank pain, frequency, hematuria and urgency.  Musculoskeletal:  Negative for back pain, joint pain and myalgias.  Skin:  Negative for rash.  Neurological:  Negative for dizziness, tingling, focal weakness, seizures, weakness and headaches.  Endo/Heme/Allergies:  Does not bruise/bleed easily.  Psychiatric/Behavioral:  Negative for depression and suicidal ideas. The patient does not have insomnia.       Allergies  Allergen Reactions   Sulfa Antibiotics Nausea Only and Nausea And Vomiting     Past Medical History:  Diagnosis Date   B12 deficiency    Back pain    CLL (chronic lymphocytic leukemia) (HCC) 06/21/2016   Rai stage 0, dx 06/2016 (Dr Smith Robert)   HLD (hyperlipidemia)    Joint pain    Knee problem    Macular degeneration of both eyes    OSA on CPAP 2006, 04/2014   AHI of 23.8 and an RDI of 42.9, on CPAP 9 cm H2O (Dohmeier)   Osteopenia 08/2010   T -1.4 at L femur neck   Piriformis syndrome 12/14/2014   Vitamin D deficiency      Past Surgical History:  Procedure Laterality  Date   ABDOMINAL HYSTERECTOMY  01/27/1983   for menometrorrhagia, one ovary remained   CHOLECYSTECTOMY N/A 08/01/2018   LAPAROSCOPIC CHOLECYSTECTOMY; for gallstone pancreatitis  Lady Gary, Victorino Dike, MD)   COLONOSCOPY  2012   no records available. per patient normal.    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, fitness trainer  Tobacco Use   Smoking status: Former    Current packs/day: 0.00    Average packs/day: 2.0 packs/day for 20.0 years (40.0 ttl pk-yrs)    Types: Cigarettes    Start date: 01/26/1958    Quit date: 01/26/1978    Years since quitting: 44.7   Smokeless tobacco: Never  Vaping Use   Vaping status: Never Used  Substance and Sexual Activity   Alcohol use: Yes    Comment: occasionally   Drug use: Never   Sexual activity: Yes  Other Topics Concern   Not on file  Social History Narrative   Lives with daughter, 1 dog   Occupation: retired, has worked in Pharmacologist, also Optometrist   Edu: 1.5 yrs college   Activity: square dancing   Diet: good water, fruits/vegetables daily   Patient drinks two cups of caffeine daily.   Remarried.     Social Determinants of Health   Financial Resource Strain: Low Risk  (10/24/2019)   Overall Financial Resource Strain (CARDIA)    Difficulty of Paying Living Expenses: Not hard at all  Food Insecurity: No Food Insecurity (10/24/2019)   Hunger Vital Sign    Worried About Running Out of Food in the Last Year: Never true    Ran Out of Food in the Last Year: Never true  Transportation Needs: No Transportation Needs (10/24/2019)   PRAPARE - Administrator, Civil Service (Medical): No    Lack of Transportation (Non-Medical): No  Physical Activity: Inactive (10/24/2019)   Exercise Vital Sign    Days of Exercise per Week: 0 days    Minutes of Exercise per Session: 0 min  Stress: No Stress Concern Present (10/24/2019)   [employer] of Occupational Health - Occupational Stress  Questionnaire    Feeling of Stress : Not at all  Social Connections: Not on file  Intimate Partner Violence: Not At Risk (10/24/2019)   Humiliation, Afraid, Rape, and Kick questionnaire    Fear of Current or Ex-Partner: No    Emotionally Abused: No    Physically Abused: No    Sexually Abused: No    Family History  Problem Relation Age of Onset   Hyperlipidemia Mother    CAD Mother        CABG   Hypertension Mother    Hyperlipidemia Father    CAD Father        MI   Sudden death Father    Cancer Sister        breast   Breast cancer Sister 35   Sleep apnea Brother    Sleep apnea Brother    Sleep apnea Brother    Arthritis Maternal Grandmother    Alzheimer's disease Other    Heart attack Other    Diabetes Neg  Hx    Stroke Neg Hx      Current Outpatient Medications:    aspirin EC 81 MG tablet, Take 81 mg by mouth daily. Swallow whole., Disp: , Rfl:    fluticasone (FLONASE) 50 MCG/ACT nasal spray, PLACE 2 SPRAYS INTO BOTH NOSTRILS DAILY AS NEEDED (Patient taking differently: Place 2 sprays into both nostrils as needed.), Disp: 16 mL, Rfl: 0   Multiple Vitamins-Minerals (PRESERVISION AREDS) TABS, Take 2 tablets by mouth daily., Disp: , Rfl:    Cholecalciferol (VITAMIN D) 125 MCG (5000 UT) CAPS, Take 1 capsule by mouth daily. (Patient not taking: Reported on 09/15/2022), Disp: 30 capsule, Rfl:    Cyanocobalamin (VITAMIN B-12) 2500 MCG SUBL, Place 1 tablet (2,500 mcg total) under the tongue daily. (Patient not taking: Reported on 09/15/2022), Disp: , Rfl: 0  Physical exam:  Vitals:   10/13/22 1045  BP: 126/63  Pulse: 70  Resp: 18  Temp: (!) 96.4 F (35.8 C)  TempSrc: Tympanic  SpO2: 96%  Weight: 172 lb 14.4 oz (78.4 kg)   Physical Exam Cardiovascular:     Rate and Rhythm: Normal rate and regular rhythm.     Heart sounds: Normal heart sounds.  Pulmonary:     Effort: Pulmonary effort is normal.     Breath sounds: Normal breath sounds.  Skin:    General: Skin is warm  and dry.  Neurological:     Mental Status: She is alert and oriented to person, place, and time.         Latest Ref Rng & Units 07/10/2022   10:50 AM  CMP  Glucose 70 - 99 mg/dL 92   BUN 8 - 23 mg/dL 11   Creatinine 4.09 - 1.00 mg/dL 8.11   Sodium 914 - 782 mmol/L 139   Potassium 3.5 - 5.1 mmol/L 4.7   Chloride 98 - 111 mmol/L 103   CO2 22 - 32 mmol/L 27   Calcium 8.9 - 10.3 mg/dL 9.1   Total Protein 6.5 - 8.1 g/dL 6.9   Total Bilirubin 0.3 - 1.2 mg/dL 1.0   Alkaline Phos 38 - 126 U/L 75   AST 15 - 41 U/L 26   ALT 0 - 44 U/L 20       Latest Ref Rng & Units 10/13/2022   10:13 AM  CBC  WBC 4.0 - 10.5 K/uL 66.1   Hemoglobin 12.0 - 15.0 g/dL 95.6   Hematocrit 21.3 - 46.0 % 54.6   Platelets 150 - 400 K/uL 80     No images are attached to the encounter.  NM PET Image Initial (PI) Skull Base To Thigh  Result Date: 10/13/2022 CLINICAL DATA:  Initial treatment strategy for chronic lymphocytic leukemia. EXAM: NUCLEAR MEDICINE PET SKULL BASE TO THIGH TECHNIQUE: 9.65 mCi F-18 FDG was injected intravenously. Full-ring PET imaging was performed from the skull base to thigh after the radiotracer. CT data was obtained and used for attenuation correction and anatomic localization. Fasting blood glucose: 86 mg/dl COMPARISON:  6 is none FINDINGS: Mediastinal blood pool activity: SUV max 2.7 Liver activity: SUV max 3.5 NECK: multiple small bilateral cervical lymph nodes are not pathologic by size criteria but are prominent in the number of nodes. No significant metabolic activity associated with the small nodes. Incidental CT findings: None. CHEST: Bilateral minimally hypermetabolic but numerous small axillary lymph nodes. Example cluster of 8 mm nodes in the RIGHT axilla (image 51) with SUV max equal 2.2. This is less than background blood pool activity. Similar  nodes in the LEFT axilla. No pathologic enlarged mediastinal nodes. No significant hypermetabolic activity. Incidental CT findings: None.  ABDOMEN/PELVIS: Several prominent periaortic retroperitoneal lymph nodes are mildly enlarged. For example LEFT periaortic lymph node measuring 12 mm (98). This node has low metabolic activity (SUV max equal 3.0) which is less than liver activity and above background blood pool. Similar iliac lymph nodes. For example 8 mm LEFT operator node with SUV max equal 3.8 (image 138). No hypermetabolic or enlarged inguinal nodes. Asymmetric hypermetabolic activity associated with the LEFT adrenal gland with SUV max equal 4.5 on image 88. Mild nodularity measuring 10 mm on image 89/6) Incidental CT findings: Postcholecystectomy. Normal bowel. Post hysterectomy. SKELETON: No focal hypermetabolic activity to suggest skeletal metastasis. Incidental CT findings: None. IMPRESSION: 1. Mildly enlarged and mildly hypermetabolic axillary, Retroperitoneal and iliac lymph nodes consistent with low-grade lymphoma. 2. Normal spleen and bone marrow. 3. Moderate asymmetric metabolic activity associated with the LEFT adrenal gland. Indeterminate finding with differential including hyperplasia versus lymphoma involvement. Primary adrenal carcinoma not favored. Recommend attention on routine oncology surveillance. c Electronically Signed   By: Genevive Bi M.D.   On: 10/13/2022 11:50     Assessment and plan- Patient is a 77 y.o. female with history of Rai stage IV CLL T p53 mutated here to discuss further management  There has been a continued increase in her white cell count/lymphocyte count but the absolute lymphocyte doubling time remains around 8 to 9 months.  However patient has had consistent thrombocytopenia with a platelet count that has remained less than 100 and fluctuating between 50s to 90s over the last 3 months.  This does constitute stage IV CLL.  62% bone marrow involvement with CLL.  Moreover patient has T p53 positivity which indicates high risk CLL.  Interestingly patient does not have anemia but rather has  polycythemia of unclear etiology.  No B symptoms.  I discussed that we are getting to a point of needing treatment for her CLL soon.PET CT scan results were not back at the time of my visit today.  After the patient left PET scan results were back which shows nonbulky axillary and retroperitoneal adenopathy with a low SUV uptake.  Normal-appearing spleen and bone marrow.  It would not be wrong to initiate treatment at this time for her CLL given her thrombocytopenia but we could also wait and see how her counts do in the next couple of months.  Patient would like to wait especially since she does not have any B symptoms.  CBC with differential in 1 month and 2 months and I will see her back in 2 months.  Given the T p53 subtype I would recommend continuous Zanubrutinib for her until progression or toxicity when we initiate treatment.  Discussed risks and benefits of Zanubrutinib including all but not limited to nausea vomiting low blood counts, electrolyte disturbances abnormal LFTs hypertension skin rash and atrial fibrillation.  Treatment will be given until progression or toxicity.  We will start working on insurance approval for the same and plan to potentially start treatment in a couple of months.  I would not prefer fixed duration venetoclax plus obinutuzumab for her given the relative lack of efficacy in the T p53 subtype.  Progression-free survival - Among those without del(17p), zanubrutinib improved PFS over that seen with bendamustine plus rituximab (estimated two-year PFS 86 versus 70 percent; HR 0.42; 95% CI 0.28-0.63). A PFS benefit with zanubrutinib was seen in most predefined subgroups except in patients with  mutated IGHV. Subgroup analysis was limited by a small sample size in those with SLL presentation or TP53 mutation.Those with del(17p) treated with zanubrutinib had an estimated 70-month PFS of 89 percent   Patient has a 2 cm firm swelling in the dorsal surface of her right foot which is  immobile and may represent a cyst.  If it continues to increase in size I will refer her to surgery.   Visit Diagnosis 1. CLL (chronic lymphocytic leukemia) (HCC)   2. Goals of care, counseling/discussion      Dr. Owens Shark, MD, MPH [facility] [phone] 10/13/2022 12:55 PM

## 2022-10-13 NOTE — Progress Notes (Signed)
Clinical Pharmacist Practitioner Clinic [facility]  Telephone:([phone] [phone]  Patient Care Team: Eustaquio Boyden, MD as PCP - General (Family Medicine) Etta Quill, MD as Attending Physician (Ophthalmology) Lelon Perla, DDS as Consulting Physician (Dentistry) Creig Hines, MD as Consulting Physician (Hematology and Oncology)   Name of the patient: Lori Shaw  [id number]  1945-11-23   Date of visit: 10/13/22  HPI: Patient is a 77 y.o. female with CLL, now requiring treatment. Patient is considering if she wants to start Brukinsa (zanubrutinib).  Reason for Consult: Zanubrutinib oral chemotherapy education.   PAST MEDICAL HISTORY: Past Medical History:  Diagnosis Date   B12 deficiency    Back pain    CLL (chronic lymphocytic leukemia) (HCC) 06/21/2016   Rai stage 0, dx 06/2016 (Dr Smith Robert)   HLD (hyperlipidemia)    Joint pain    Knee problem    Macular degeneration of both eyes    OSA on CPAP 2006, 04/2014   AHI of 23.8 and an RDI of 42.9, on CPAP 9 cm H2O (Dohmeier)   Osteopenia 08/2010   T -1.4 at L femur neck   Piriformis syndrome 12/14/2014   Vitamin D deficiency     HEMATOLOGY/ONCOLOGY HISTORY:  Oncology History   No history exists.    ALLERGIES:  is allergic to sulfa antibiotics.  MEDICATIONS:  Current Outpatient Medications  Medication Sig Dispense Refill   aspirin EC 81 MG tablet Take 81 mg by mouth daily. Swallow whole.     Cholecalciferol (VITAMIN D) 125 MCG (5000 UT) CAPS Take 1 capsule by mouth daily. (Patient not taking: Reported on 09/15/2022) 30 capsule    Cyanocobalamin (VITAMIN B-12) 2500 MCG SUBL Place 1 tablet (2,500 mcg total) under the tongue daily. (Patient not taking: Reported on 09/15/2022)  0   fluticasone (FLONASE) 50 MCG/ACT nasal spray PLACE 2 SPRAYS INTO BOTH NOSTRILS DAILY AS NEEDED (Patient taking differently: Place 2 sprays into both nostrils as needed.) 16 mL 0   Multiple  Vitamins-Minerals (PRESERVISION AREDS) TABS Take 2 tablets by mouth daily.     No current facility-administered medications for this visit.    VITAL SIGNS: There were no vitals taken for this visit. There were no vitals filed for this visit.  Estimated body mass index is 28.77 kg/m as calculated from the following:   Height as of 09/15/22: 5\' 5"  (1.651 m).   Weight as of an earlier encounter on 10/13/22: 78.4 kg (172 lb 14.4 oz).  LABS: CBC:    Component Value Date/Time   WBC 66.1 (HH) 10/13/2022 1013   HGB 17.5 (H) 10/13/2022 1013   HGB 18.0 (H) 08/18/2022 0825   HGB 14.4 02/02/2020 1448   HGB 14.5 04/10/2010 0000   HCT 54.6 (H) 10/13/2022 1013   HCT 43.3 02/02/2020 1448   PLT 80 (L) 10/13/2022 1013   PLT 67 (L) 08/18/2022 0825   PLT 168 02/02/2020 1448   MCV 95.8 10/13/2022 1013   MCV 91 02/02/2020 1448   NEUTROABS 4.5 10/13/2022 1013   NEUTROABS 3.3 11/25/2017 1208   LYMPHSABS 44.6 (H) 10/13/2022 1013   LYMPHSABS 8.7 (H) 11/25/2017 1208   MONOABS 15.3 (H) 10/13/2022 1013   EOSABS 0.7 (H) 10/13/2022 1013   EOSABS 0.3 11/25/2017 1208   BASOSABS 0.7 (H) 10/13/2022 1013   BASOSABS 0.1 11/25/2017 1208   Comprehensive Metabolic Panel:    Component Value Date/Time   NA 139 07/10/2022 1050   NA 142 02/02/2020 1448   K 4.7 07/10/2022 1050  CL 103 07/10/2022 1050   CO2 27 07/10/2022 1050   BUN 11 07/10/2022 1050   BUN 11 02/02/2020 1448   CREATININE 0.86 07/10/2022 1050   GLUCOSE 92 07/10/2022 1050   CALCIUM 9.1 07/10/2022 1050   AST 26 07/10/2022 1050   ALT 20 07/10/2022 1050   ALKPHOS 75 07/10/2022 1050   ALKPHOS 79 04/10/2010 0000   BILITOT 1.0 07/10/2022 1050   PROT 6.9 07/10/2022 1050   PROT 6.2 02/02/2020 1448   ALBUMIN 4.4 07/10/2022 1050   ALBUMIN 4.6 02/02/2020 1448     Present during today's visit: Patient and her husband   Start plan: Pending patient's discussion on whether to proceed with treatment   Patient Education I spoke with patient for  overview of new oral chemotherapy medication: zanubrutinib   Administration: Counseled patient on administration, dosing, side effects, monitoring, drug-food interactions, safe handling, storage, and disposal.  Side Effects: Side effects include but not limited to: rash, muscle pain, decreased wbc/hgb/plt, fatigue.    Drug-drug Interactions (DDI): Aspirin: Zanubrutinib may enhance the antiplatelet effect of agents with antiplatelet properties. Monitor patients for signs and symptoms of bleeding.  Adherence: After discussion with patient no patient barriers to medication adherence identified.  Reviewed with patient importance of keeping a medication schedule and plan for any missed doses.  Mrs. Hooton voiced understanding and appreciation. All questions answered. Medication handout provided.  Provided patient with Oral Chemotherapy Navigation Clinic phone number. Patient knows to call the office with questions or concerns. Oral Chemotherapy Navigation Clinic will continue to follow.  Patient expressed understanding and was in agreement with this plan. She also understands that She can call clinic at any time with any questions, concerns, or complaints.   Medication Access Issues: Will proceed with medication access and patient will let us know if she wants to start treatment  Follow-up plan: pending patient discuss  Thank you for allowing me to participate in the care of this patient.   Time Total: 15 mins  Visit consisted of counseling and education on dealing with issues of symptom management in the setting of serious and potentially life-threatening illness.Greater than 50%  of this time was spent counseling and coordinating care related to the above assessment and plan.  Signed by: Remi Haggard, PharmD, BCPS, Nolon Bussing, CPP Hematology/Oncology Clinical Pharmacist Practitioner [facility] [phone]  10/13/2022 4:46 PM

## 2022-10-13 NOTE — Telephone Encounter (Addendum)
Oral Oncology Patient Advocate Encounter  Prior Authorization for Brukinsa has been approved.    PA# [id number]  Effective dates: 01/26/22 through 01/26/23  Patients co-pay is $3,325.71.   Obtained [employer] [facility] to make co-pay $0.00.   Ardeen Fillers, CPhT Oncology Pharmacy Patient Advocate  [facility]  [phone] (phone) [phone] (fax) 10/13/2022 2:36 PM

## 2022-10-13 NOTE — Telephone Encounter (Signed)
Oral Oncology Patient Advocate Encounter  New authorization   Received notification that prior authorization for Brukinsa is required.   PA submitted on 10/13/22  Key [id number]  Status is pending     Ardeen Fillers, CPhT Oncology Pharmacy Patient Advocate  [facility]  [phone] (phone) [phone] (fax) 10/13/2022 2:22 PM

## 2022-10-14 ENCOUNTER — Telehealth: Payer: Self-pay | Admitting: Pharmacist

## 2022-10-14 NOTE — Telephone Encounter (Signed)
Pharmacy Student Encounter   Received new prescription for Brukinsa (zanubrutinib) for the treatment of CLL, planned duration until disease progression or unacceptable toxicity.  Labs from CBC with differential from 09/15/2022 and CMP from 07/10/2022 assessed, no relevant lab abnormalities. Prescription dose and frequency assessed.   Current medication list in Epic reviewed, 1 DDIs with Brukinsa identified:  Aspirin: Zanubrutinib may enhance the antiplatelet effect of agents with antiplatelet properties. Monitor patients for signs and symptoms of bleeding.   Evaluated chart and no patient barriers to medication adherence identified.   Oral Oncology Clinic will continue to follow for insurance authorization, copayment issues, initial counseling and start date.  Jaymes Graff PharmD Candidate [address] [place] [phone]  10/14/2022 9:57 AM

## 2022-10-16 ENCOUNTER — Telehealth: Payer: Self-pay | Admitting: Oncology

## 2022-10-16 NOTE — Telephone Encounter (Signed)
Pt left a vm stating that her PET results are in mychart and wants Dr.Rao to call her.   Thank you

## 2022-10-17 NOTE — Telephone Encounter (Signed)
Oral chemo process on hold, patient deciding if sh wants to proceed with treatment at this time or not.

## 2022-10-19 ENCOUNTER — Ambulatory Visit
Admission: RE | Admit: 2022-10-19 | Discharge: 2022-10-19 | Disposition: A | Discharge status: Home / Self Care | Payer: Medicare HMO | Source: Ambulatory Visit | Attending: Family Medicine | Admitting: Family Medicine

## 2022-10-19 DIAGNOSIS — Z1231 Encounter for screening mammogram for malignant neoplasm of breast: Secondary | ICD-10-CM | POA: Diagnosis not present

## 2022-10-23 NOTE — Telephone Encounter (Signed)
Per Dr. Smith Robert, patient is not starting yet and she will let us know if that changes. Oral chemo signing off for now.

## 2022-11-10 ENCOUNTER — Telehealth: Payer: Self-pay | Admitting: *Deleted

## 2022-11-10 ENCOUNTER — Inpatient Hospital Stay: Payer: Medicare HMO | Attending: Oncology

## 2022-11-10 DIAGNOSIS — C9110 Chronic lymphocytic leukemia of B-cell type not having achieved remission: Secondary | ICD-10-CM | POA: Diagnosis not present

## 2022-11-10 LAB — CBC WITH DIFFERENTIAL/PLATELET
Abs Immature Granulocytes: 0.24 10*3/uL — ABNORMAL HIGH (ref 0.00–0.07)
Basophils Absolute: 0.9 10*3/uL — ABNORMAL HIGH (ref 0.0–0.1)
Basophils Relative: 1 %
Eosinophils Absolute: 0.7 10*3/uL — ABNORMAL HIGH (ref 0.0–0.5)
Eosinophils Relative: 1 %
HCT: 54.9 % — ABNORMAL HIGH (ref 36.0–46.0)
Hemoglobin: 17.9 g/dL — ABNORMAL HIGH (ref 12.0–15.0)
Immature Granulocytes: 0 %
Lymphocytes Relative: 68 %
Lymphs Abs: 48.3 10*3/uL — ABNORMAL HIGH (ref 0.7–4.0)
MCH: 30.7 pg (ref 26.0–34.0)
MCHC: 32.6 g/dL (ref 30.0–36.0)
MCV: 94 fL (ref 80.0–100.0)
Monocytes Absolute: 17.1 10*3/uL — ABNORMAL HIGH (ref 0.1–1.0)
Monocytes Relative: 24 %
Neutro Abs: 4 10*3/uL (ref 1.7–7.7)
Neutrophils Relative %: 6 %
Platelets: 64 10*3/uL — ABNORMAL LOW (ref 150–400)
RBC: 5.84 MIL/uL — ABNORMAL HIGH (ref 3.87–5.11)
RDW: 15.1 % (ref 11.5–15.5)
Smear Review: NORMAL
WBC: 71.2 10*3/uL (ref 4.0–10.5)
nRBC: 0 % (ref 0.0–0.2)

## 2022-11-10 LAB — COMPREHENSIVE METABOLIC PANEL
ALT: 24 U/L (ref 0–44)
AST: 33 U/L (ref 15–41)
Albumin: 4.2 g/dL (ref 3.5–5.0)
Alkaline Phosphatase: 81 U/L (ref 38–126)
Anion gap: 7 (ref 5–15)
BUN: 11 mg/dL (ref 8–23)
CO2: 25 mmol/L (ref 22–32)
Calcium: 9 mg/dL (ref 8.9–10.3)
Chloride: 106 mmol/L (ref 98–111)
Creatinine, Ser: 0.71 mg/dL (ref 0.44–1.00)
GFR, Estimated: >60 mL/min (ref >60)
Glucose, Bld: 89 mg/dL (ref 70–99)
Potassium: 3.8 mmol/L (ref 3.5–5.1)
Sodium: 138 mmol/L (ref 135–145)
Total Bilirubin: 0.7 mg/dL (ref 0.3–1.2)
Total Protein: 6.7 g/dL (ref 6.5–8.1)

## 2022-11-10 LAB — LACTATE DEHYDROGENASE: LDH: 244 U/L — ABNORMAL HIGH (ref 98–192)

## 2022-11-10 LAB — URIC ACID: Uric Acid, Serum: 6.8 mg/dL (ref 2.5–7.1)

## 2022-11-10 NOTE — Telephone Encounter (Signed)
Critical lab value reported by cancer center lab per [facility].  WBC 71.2. Dr Smith Robert notified verbally per RN.

## 2022-11-20 DIAGNOSIS — H2513 Age-related nuclear cataract, bilateral: Secondary | ICD-10-CM | POA: Diagnosis not present

## 2022-11-20 DIAGNOSIS — H524 Presbyopia: Secondary | ICD-10-CM | POA: Diagnosis not present

## 2022-12-01 DIAGNOSIS — G4733 Obstructive sleep apnea (adult) (pediatric): Secondary | ICD-10-CM | POA: Diagnosis not present

## 2022-12-08 ENCOUNTER — Encounter: Payer: Self-pay | Admitting: Oncology

## 2022-12-08 ENCOUNTER — Inpatient Hospital Stay: Payer: Medicare HMO | Admitting: Oncology

## 2022-12-08 ENCOUNTER — Inpatient Hospital Stay: Payer: Medicare HMO | Attending: Oncology

## 2022-12-08 VITALS — BP 123/72 | HR 62 | Temp 96.0°F | Resp 18 | Wt 168.2 lb

## 2022-12-08 DIAGNOSIS — C9110 Chronic lymphocytic leukemia of B-cell type not having achieved remission: Secondary | ICD-10-CM

## 2022-12-08 DIAGNOSIS — M8580 Other specified disorders of bone density and structure, unspecified site: Secondary | ICD-10-CM | POA: Diagnosis not present

## 2022-12-08 DIAGNOSIS — D696 Thrombocytopenia, unspecified: Secondary | ICD-10-CM | POA: Insufficient documentation

## 2022-12-08 LAB — CBC WITH DIFFERENTIAL/PLATELET
Abs Immature Granulocytes: 0.24 10*3/uL — ABNORMAL HIGH (ref 0.00–0.07)
Basophils Absolute: 0.7 10*3/uL — ABNORMAL HIGH (ref 0.0–0.1)
Basophils Relative: 1 %
Eosinophils Absolute: 0.7 10*3/uL — ABNORMAL HIGH (ref 0.0–0.5)
Eosinophils Relative: 1 %
HCT: 54.1 % — ABNORMAL HIGH (ref 36.0–46.0)
Hemoglobin: 17.4 g/dL — ABNORMAL HIGH (ref 12.0–15.0)
Immature Granulocytes: 0 %
Lymphocytes Relative: 64 %
Lymphs Abs: 44.2 10*3/uL — ABNORMAL HIGH (ref 0.7–4.0)
MCH: 30.6 pg (ref 26.0–34.0)
MCHC: 32.2 g/dL (ref 30.0–36.0)
MCV: 95.2 fL (ref 80.0–100.0)
Monocytes Absolute: 18.8 10*3/uL — ABNORMAL HIGH (ref 0.1–1.0)
Monocytes Relative: 28 %
Neutro Abs: 3.9 10*3/uL (ref 1.7–7.7)
Neutrophils Relative %: 6 %
Platelets: 61 10*3/uL — ABNORMAL LOW (ref 150–400)
RBC: 5.68 MIL/uL — ABNORMAL HIGH (ref 3.87–5.11)
RDW: 16.6 % — ABNORMAL HIGH (ref 11.5–15.5)
Smear Review: NORMAL
WBC: 68.5 10*3/uL (ref 4.0–10.5)
nRBC: 0 % (ref 0.0–0.2)

## 2022-12-08 LAB — LACTATE DEHYDROGENASE: LDH: 240 U/L — ABNORMAL HIGH (ref 98–192)

## 2022-12-08 LAB — COMPREHENSIVE METABOLIC PANEL
ALT: 18 U/L (ref 0–44)
AST: 26 U/L (ref 15–41)
Albumin: 4.1 g/dL (ref 3.5–5.0)
Alkaline Phosphatase: 76 U/L (ref 38–126)
Anion gap: 9 (ref 5–15)
BUN: 13 mg/dL (ref 8–23)
CO2: 25 mmol/L (ref 22–32)
Calcium: 9 mg/dL (ref 8.9–10.3)
Chloride: 102 mmol/L (ref 98–111)
Creatinine, Ser: 0.79 mg/dL (ref 0.44–1.00)
GFR, Estimated: >60 mL/min (ref >60)
Glucose, Bld: 91 mg/dL (ref 70–99)
Potassium: 5 mmol/L (ref 3.5–5.1)
Sodium: 136 mmol/L (ref 135–145)
Total Bilirubin: 1 mg/dL (ref <1.2)
Total Protein: 6.7 g/dL (ref 6.5–8.1)

## 2022-12-08 LAB — URIC ACID: Uric Acid, Serum: 5.9 mg/dL (ref 2.5–7.1)

## 2022-12-08 NOTE — Progress Notes (Signed)
Hematology/Oncology Consult note [facility]  Telephone:([phone] [phone]  Patient Care Team: Eustaquio Boyden, MD as PCP - General (Family Medicine) Etta Quill, MD as Attending Physician (Ophthalmology) Lelon Perla, DDS as Consulting Physician (Dentistry) Creig Hines, MD as Consulting Physician (Hematology and Oncology)   Name of the patient: Lori Shaw  [id number]  Jul 24, 1945   Date of visit: 12/08/22  Diagnosis-Rai stage IV CLL  Chief complaint/ Reason for visit-discuss management of CLL  Heme/Onc history: patient is a 77 year old female with no significant past medical history other than low B12 and low vitamin D levels. Patient was recently noted to have leukocytosis with a white count of 13. Differential mainly showed lymphocytosis. Recent CBC from 07/03/2016 showed white count of 13, H&H of 14.7/44 and a platelet count of 207. Differential showed 63.5% lymphocytes. Peripheral flow cytometry showed CD5 positive, CD23 positive clonal B-cell population CLL/SLL phenotype.  Patient has not required any treatment for CLL so far    There has been a progressiveReason leukocytosis/lymphocytosis since June 2023.  Absolute lymphocyte doubling time remains about 7 to 8 months.  Also patient noted to have progressive thrombocytopenia since December 2023.  No B symptoms. Patient has mutated IgH V.  FISH for CLL showed 38.5% of nuclei positive for T p53. Patient also noted to have polycythemia with hemoglobin between 17-18 since June 2024.  JAK2, CALR, MPL mutation negative.  EPO levels normal.  Patient is a non-smoker.  No overt evidence of myeloproliferative neoplasm noted on bone marrow biopsy.62% bone marrow involvement with CLL noted in July 2024  Interval history-patient reports feeling well presently.  Denies any changes in her appetite or weight.  Denies any lumps or bumps anywhere.  Denies any drenching night sweats or  fatigue  ECOG PS- 1 Pain scale- 0   Review of systems- Review of Systems  Constitutional:  Negative for chills, fever, malaise/fatigue and weight loss.  HENT:  Negative for congestion, ear discharge and nosebleeds.   Eyes:  Negative for blurred vision.  Respiratory:  Negative for cough, hemoptysis, sputum production, shortness of breath and wheezing.   Cardiovascular:  Negative for chest pain, palpitations, orthopnea and claudication.  Gastrointestinal:  Negative for abdominal pain, blood in stool, constipation, diarrhea, heartburn, melena, nausea and vomiting.  Genitourinary:  Negative for dysuria, flank pain, frequency, hematuria and urgency.  Musculoskeletal:  Negative for back pain, joint pain and myalgias.  Skin:  Negative for rash.  Neurological:  Negative for dizziness, tingling, focal weakness, seizures, weakness and headaches.  Endo/Heme/Allergies:  Does not bruise/bleed easily.  Psychiatric/Behavioral:  Negative for depression and suicidal ideas. The patient does not have insomnia.       Allergies  Allergen Reactions   Sulfa Antibiotics Nausea Only and Nausea And Vomiting     Past Medical History:  Diagnosis Date   B12 deficiency    Back pain    CLL (chronic lymphocytic leukemia) (HCC) 06/21/2016   Rai stage 0, dx 06/2016 (Dr Smith Robert)   HLD (hyperlipidemia)    Joint pain    Knee problem    Macular degeneration of both eyes    OSA on CPAP 2006, 04/2014   AHI of 23.8 and an RDI of 42.9, on CPAP 9 cm H2O (Dohmeier)   Osteopenia 08/2010   T -1.4 at L femur neck   Piriformis syndrome 12/14/2014   Vitamin D deficiency      Past Surgical History:  Procedure Laterality Date   ABDOMINAL HYSTERECTOMY  01/27/1983   for menometrorrhagia, one ovary remained   CHOLECYSTECTOMY N/A 08/01/2018   LAPAROSCOPIC CHOLECYSTECTOMY; for gallstone pancreatitis  Lady Gary, Victorino Dike, MD)   COLONOSCOPY  2012   no records available. per patient normal.    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, fitness trainer  Tobacco Use   Smoking status: Former    Current packs/day: 0.00    Average packs/day: 2.0 packs/day for 20.0 years (40.0 ttl pk-yrs)    Types: Cigarettes    Start date: 01/26/1958    Quit date: 01/26/1978    Years since quitting: 44.8   Smokeless tobacco: Never  Vaping Use   Vaping status: Never Used  Substance and Sexual Activity   Alcohol use: Yes    Comment: occasionally   Drug use: Never   Sexual activity: Yes  Other Topics Concern   Not on file  Social History Narrative   Lives with daughter, 1 dog   Occupation: retired, has worked in Pharmacologist, also Optometrist   Edu: 1.5 yrs college   Activity: square dancing   Diet: good water, fruits/vegetables daily   Patient drinks two cups of caffeine daily.   Remarried.     Social Determinants of Health   Financial Resource Strain: Low Risk  (10/24/2019)   Overall Financial Resource Strain (CARDIA)    Difficulty of Paying Living Expenses: Not hard at all  Food Insecurity: No Food Insecurity (10/24/2019)   Hunger Vital Sign    Worried About Running Out of Food in the Last Year: Never true    Ran Out of Food in the Last Year: Never true  Transportation Needs: No Transportation Needs (10/24/2019)   PRAPARE - Administrator, Civil Service (Medical): No    Lack of Transportation (Non-Medical): No  Physical Activity: Inactive (10/24/2019)   Exercise Vital Sign    Days of Exercise per Week: 0 days    Minutes of Exercise per Session: 0 min  Stress: No Stress Concern Present (10/24/2019)   [employer] of Occupational Health - Occupational Stress Questionnaire     Feeling of Stress : Not at all  Social Connections: Not on file  Intimate Partner Violence: Not At Risk (10/24/2019)   Humiliation, Afraid, Rape, and Kick questionnaire    Fear of Current or Ex-Partner: No    Emotionally Abused: No    Physically Abused: No    Sexually Abused: No    Family History  Problem Relation Age of Onset   Hyperlipidemia Mother    CAD Mother        CABG   Hypertension Mother    Hyperlipidemia Father    CAD Father        MI   Sudden death Father    Cancer Sister        breast   Breast cancer Sister 35   Sleep apnea Brother    Sleep apnea Brother    Sleep apnea Brother    Arthritis Maternal Grandmother    Alzheimer's disease Other    Heart attack Other    Diabetes Neg Hx    Stroke Neg  Hx      Current Outpatient Medications:    aspirin EC 81 MG tablet, Take 81 mg by mouth daily. Swallow whole., Disp: , Rfl:    Cholecalciferol (VITAMIN D) 125 MCG (5000 UT) CAPS, Take 1 capsule by mouth daily. (Patient not taking: Reported on 09/15/2022), Disp: 30 capsule, Rfl:    Cyanocobalamin (VITAMIN B-12) 2500 MCG SUBL, Place 1 tablet (2,500 mcg total) under the tongue daily. (Patient not taking: Reported on 09/15/2022), Disp: , Rfl: 0   fluticasone (FLONASE) 50 MCG/ACT nasal spray, PLACE 2 SPRAYS INTO BOTH NOSTRILS DAILY AS NEEDED (Patient taking differently: Place 2 sprays into both nostrils as needed.), Disp: 16 mL, Rfl: 0   Multiple Vitamins-Minerals (PRESERVISION AREDS) TABS, Take 2 tablets by mouth daily., Disp: , Rfl:   Physical exam:  Vitals:   12/08/22 1038  BP: 123/72  Pulse: 62  Resp: 18  Temp: (!) 96 F (35.6 C)  TempSrc: Tympanic  SpO2: 99%  Weight: 168 lb 3.2 oz (76.3 kg)   Physical Exam Cardiovascular:     Rate and Rhythm: Normal rate and regular rhythm.     Heart sounds: Normal heart sounds.  Pulmonary:     Effort: Pulmonary effort is normal.     Breath sounds: Normal breath sounds.  Skin:    General: Skin is warm and dry.   Neurological:     Mental Status: She is alert and oriented to person, place, and time.         Latest Ref Rng & Units 12/08/2022   10:17 AM  CMP  Glucose 70 - 99 mg/dL 91   BUN 8 - 23 mg/dL 13   Creatinine 2.84 - 1.00 mg/dL 1.32   Sodium 440 - 102 mmol/L 136   Potassium 3.5 - 5.1 mmol/L 5.0   Chloride 98 - 111 mmol/L 102   CO2 22 - 32 mmol/L 25   Calcium 8.9 - 10.3 mg/dL 9.0   Total Protein 6.5 - 8.1 g/dL 6.7   Total Bilirubin <7.2 mg/dL 1.0   Alkaline Phos 38 - 126 U/L 76   AST 15 - 41 U/L 26   ALT 0 - 44 U/L 18       Latest Ref Rng & Units 12/08/2022   10:17 AM  CBC  WBC 4.0 - 10.5 K/uL 68.5   Hemoglobin 12.0 - 15.0 g/dL 53.6   Hematocrit 64.4 - 46.0 % 54.1   Platelets 150 - 400 K/uL 61     No images are attached to the encounter.  No results found.   Assessment and plan- Patient is a 77 y.o. female with history of Rai stage IV CLL here for routine follow-up  Bone marrow biopsy in July 2024 showed 62% involvement with CLL.  She does have high risk phenotype given T p53 mutation.  There has been a gradual increase in her absolute lymphocyte count and presently her lymphocyte count is at 44 as compared to 34 5 months prior.  Absolute lymphocyte doubling time remains more than 6 months.  However she does have significant thrombocytopenia and her platelet counts have mostly fluctuated between 60s to 80s over the last 6 months.  Treatment for her CLL can be considered based on her thrombocytopenia of less than 100 itself.  However patient does not have any B symptoms and does not wish to start any treatment for her CLL at this time.  We discussed in the past as well that given her T p53 mutation whenever we decide to  start treatment I would recommend continuous Zanubrutinib for her.  Patient would like to hold off treatment at this time.  I will see her back in 2 months with CBC with differential CMP and LDH.  PET CT scan Has shown nonbulky diffuse adenopathy and no evidence of  splenomegaly   Visit Diagnosis 1. CLL (chronic lymphocytic leukemia) (HCC)      Dr. Owens Shark, MD, MPH [facility] at [facility] [phone] 12/08/2022 12:31 PM

## 2023-01-11 ENCOUNTER — Ambulatory Visit: Payer: Medicare HMO | Admitting: Oncology

## 2023-01-11 ENCOUNTER — Other Ambulatory Visit: Payer: Medicare HMO

## 2023-01-15 ENCOUNTER — Ambulatory Visit: Payer: Medicare HMO | Admitting: Oncology

## 2023-01-15 ENCOUNTER — Other Ambulatory Visit: Payer: Medicare HMO

## 2023-02-09 ENCOUNTER — Telehealth: Payer: Self-pay | Admitting: Oncology

## 2023-02-09 ENCOUNTER — Telehealth: Payer: Self-pay

## 2023-02-09 ENCOUNTER — Other Ambulatory Visit (HOSPITAL_COMMUNITY): Payer: Self-pay

## 2023-02-09 NOTE — Telephone Encounter (Signed)
 Patient wanted me to let Dr. Smith Robert know that she is ready to start treatment.

## 2023-02-09 NOTE — Telephone Encounter (Signed)
 She will keep her appt with me next month as planned and we will plan to start treatment at that time. I am looping in [place] and [place] to get working on zanubritnib in the meanwhile

## 2023-02-09 NOTE — Telephone Encounter (Signed)
 Oral Oncology Patient Advocate Encounter  Re-authorization   Received notification that prior authorization for Brukinsa  is required.   PA submitted on 02/09/23  Key [id number]  Status is pending     Morene Potters, CPhT Oncology Pharmacy Patient Advocate  [facility]  [phone] (phone) [phone] (fax) 02/09/2023 2:12 PM

## 2023-02-10 ENCOUNTER — Telehealth: Payer: Self-pay

## 2023-02-10 ENCOUNTER — Telehealth: Payer: Self-pay | Admitting: Pharmacist

## 2023-02-10 ENCOUNTER — Other Ambulatory Visit (HOSPITAL_COMMUNITY): Payer: Self-pay

## 2023-02-10 NOTE — Telephone Encounter (Signed)
 Clinical Pharmacist Practitioner Encounter   Received new prescription for Calquence  (acalabrutinib ) for the treatment of CLL, planned duration until disease progression or unacceptable drug toxicity.  Prescription dose and frequency assessed.   Current medication list in Epic reviewed, one DDIs with acalabrutinib  identified: Aspirin: Acalabrutinib  may enhance the antiplatelet effect of Therapeutic Antiplatelets of aspirin. Monitor for signs of bleeding.   Evaluated chart and no patient barriers to medication adherence identified.   Prescription has been e-scribed to the [employer] for benefits analysis and approval.  Oral Oncology Clinic will continue to follow for insurance authorization, copayment issues, initial counseling and start date.   Earma Nicolaou N. Jaidence Geisler, PharmD, BCOP, CPP Hematology/Oncology Clinical Pharmacist [facility] Oral Chemotherapy Navigation Clinic [phone]  02/10/2023 3:01 PM

## 2023-02-10 NOTE — Telephone Encounter (Addendum)
 Oral Oncology Patient Advocate Encounter  Received notification that the request for prior authorization for Brukinsa has been denied due to "patient has not experienced an intolerable adverse event, or contraindication to Calquence  first."    MD changing medication to Calquence . See Additional Encounter.     Hansel Ley, CPhT Oncology Pharmacy Patient Advocate  [facility]  [phone] (phone) [phone] (fax) 02/10/2023 8:20 AM

## 2023-02-10 NOTE — Telephone Encounter (Signed)
 Oral Oncology Patient Advocate Encounter  Prior Authorization for Calquence  has been approved.    PA# [id number]  Effective dates: 01/27/23 through 01/26/24  Patients co-pay is $2,000.00.   [employer] obtained to make co-pay $0.00.   Hansel Ley, CPhT Oncology Pharmacy Patient Advocate  [facility]  [phone] (phone) [phone] (fax) 02/10/2023 3:02 PM

## 2023-02-10 NOTE — Telephone Encounter (Signed)
 Oral Oncology Patient Advocate Encounter  New authorization   Received notification that prior authorization for Calquence  is required.   PA submitted on 02/10/23  Key [id number]  Status is pending     Hansel Ley, CPhT Oncology Pharmacy Patient Advocate  [facility]  [phone] (phone) [phone] (fax) 02/10/2023 2:50 PM

## 2023-02-15 ENCOUNTER — Ambulatory Visit: Payer: Medicare HMO | Admitting: Oncology

## 2023-02-15 ENCOUNTER — Other Ambulatory Visit: Payer: Medicare HMO

## 2023-03-09 ENCOUNTER — Inpatient Hospital Stay: Payer: Medicare HMO | Attending: Oncology

## 2023-03-09 ENCOUNTER — Other Ambulatory Visit (HOSPITAL_COMMUNITY): Payer: Self-pay

## 2023-03-09 ENCOUNTER — Other Ambulatory Visit: Payer: Self-pay | Admitting: Pharmacy Technician

## 2023-03-09 ENCOUNTER — Other Ambulatory Visit: Payer: Self-pay

## 2023-03-09 ENCOUNTER — Inpatient Hospital Stay: Payer: Medicare HMO | Admitting: Pharmacist

## 2023-03-09 ENCOUNTER — Inpatient Hospital Stay: Payer: Medicare HMO | Admitting: Oncology

## 2023-03-09 ENCOUNTER — Encounter: Payer: Self-pay | Admitting: Oncology

## 2023-03-09 ENCOUNTER — Telehealth: Payer: Self-pay | Admitting: *Deleted

## 2023-03-09 VITALS — BP 135/76 | HR 65 | Temp 96.6°F | Resp 20 | Wt 158.7 lb

## 2023-03-09 DIAGNOSIS — R5383 Other fatigue: Secondary | ICD-10-CM

## 2023-03-09 DIAGNOSIS — C9110 Chronic lymphocytic leukemia of B-cell type not having achieved remission: Secondary | ICD-10-CM

## 2023-03-09 DIAGNOSIS — Z7189 Other specified counseling: Secondary | ICD-10-CM | POA: Diagnosis not present

## 2023-03-09 DIAGNOSIS — Z87891 Personal history of nicotine dependence: Secondary | ICD-10-CM | POA: Diagnosis not present

## 2023-03-09 LAB — COMPREHENSIVE METABOLIC PANEL
ALT: 14 U/L (ref 0–44)
AST: 24 U/L (ref 15–41)
Albumin: 4.4 g/dL (ref 3.5–5.0)
Alkaline Phosphatase: 81 U/L (ref 38–126)
Anion gap: 10 (ref 5–15)
BUN: 13 mg/dL (ref 8–23)
CO2: 25 mmol/L (ref 22–32)
Calcium: 8.9 mg/dL (ref 8.9–10.3)
Chloride: 101 mmol/L (ref 98–111)
Creatinine, Ser: 0.86 mg/dL (ref 0.44–1.00)
GFR, Estimated: >60 mL/min (ref >60)
Glucose, Bld: 85 mg/dL (ref 70–99)
Potassium: 4.4 mmol/L (ref 3.5–5.1)
Sodium: 136 mmol/L (ref 135–145)
Total Bilirubin: 1 mg/dL (ref 0.0–1.2)
Total Protein: 7.1 g/dL (ref 6.5–8.1)

## 2023-03-09 LAB — CBC WITH DIFFERENTIAL/PLATELET
Abs Immature Granulocytes: 0.43 10*3/uL — ABNORMAL HIGH (ref 0.00–0.07)
Basophils Absolute: 1.4 10*3/uL — ABNORMAL HIGH (ref 0.0–0.1)
Basophils Relative: 1 %
Eosinophils Absolute: 0.7 10*3/uL — ABNORMAL HIGH (ref 0.0–0.5)
Eosinophils Relative: 1 %
HCT: 51.8 % — ABNORMAL HIGH (ref 36.0–46.0)
Hemoglobin: 16.8 g/dL — ABNORMAL HIGH (ref 12.0–15.0)
Immature Granulocytes: 0 %
Lymphocytes Relative: 69 %
Lymphs Abs: 71.8 10*3/uL — ABNORMAL HIGH (ref 0.7–4.0)
MCH: 31.6 pg (ref 26.0–34.0)
MCHC: 32.4 g/dL (ref 30.0–36.0)
MCV: 97.4 fL (ref 80.0–100.0)
Monocytes Absolute: 26.8 10*3/uL — ABNORMAL HIGH (ref 0.1–1.0)
Monocytes Relative: 26 %
Neutro Abs: 3.3 10*3/uL (ref 1.7–7.7)
Neutrophils Relative %: 3 %
Platelets: 43 10*3/uL — ABNORMAL LOW (ref 150–400)
RBC: 5.32 MIL/uL — ABNORMAL HIGH (ref 3.87–5.11)
RDW: 16.1 % — ABNORMAL HIGH (ref 11.5–15.5)
Smear Review: NORMAL
WBC: 104.3 10*3/uL (ref 4.0–10.5)
nRBC: 0 % (ref 0.0–0.2)

## 2023-03-09 LAB — LACTATE DEHYDROGENASE: LDH: 261 U/L — ABNORMAL HIGH (ref 98–192)

## 2023-03-09 MED ORDER — CALQUENCE 100 MG PO TABS
100.0000 mg | ORAL_TABLET | Freq: Two times a day (BID) | ORAL | 1 refills | Status: DC
Start: 2023-03-09 — End: 2023-05-06
  Filled 2023-03-09: qty 60, 30d supply, fill #0
  Filled 2023-04-02 (×2): qty 60, 30d supply, fill #1

## 2023-03-09 MED ORDER — ALLOPURINOL 300 MG PO TABS: 300.0000 mg | ORAL_TABLET | Freq: Every day | ORAL | 2 refills | Status: DC

## 2023-03-09 NOTE — Telephone Encounter (Signed)
Lelon Perla the dentist for this patient wanted to check in and see if there is any premeds since she is on a chemo drug.  I spoke with Dr. Smith Robert and she says as long as she is having a cleaning there is no need for any premeds or anything like.  If the patient is having an extraction of her teeth then she would have to come in get labs see what they are in a possibility of premeds only and that kind of issue.

## 2023-03-09 NOTE — Progress Notes (Signed)
Specialty Pharmacy Initial Fill Coordination Note  Lori Shaw is a 78 y.o. female contacted today regarding refills of specialty medication(s) Acalabrutinib Maleate (Calquence) .  Patient requested Delivery  on 03/11/23  to verified address [address] [place] [address]   Medication will be filled on 03/10/23.   Patient is aware of $0 copayment.

## 2023-03-09 NOTE — Progress Notes (Signed)
Hematology/Oncology Consult note [facility]  Telephone:([phone] Fax:([phone]) [phone]  Patient Care Team: Eustaquio Boyden, MD as PCP - General (Family Medicine) Etta Quill, MD as Attending Physician (Ophthalmology) Lelon Perla, DDS as Consulting Physician (Dentistry) Creig Hines, MD as Consulting Physician (Hematology and Oncology)   Name of the patient: Lori Shaw  [id number]  25-Feb-1945   Date of visit: 03/09/23  Diagnosis-Rai stage IV CLL  Chief complaint/ Reason for visit-discuss further management of CLL  Heme/Onc history:  patient is a 78 year old female with no significant past medical history other than low B12 and low vitamin D levels. Patient was recently noted to have leukocytosis with a white count of 13. Differential mainly showed lymphocytosis. Recent CBC from 07/03/2016 showed white count of 13, H&H of 14.7/44 and a platelet count of 207. Differential showed 63.5% lymphocytes. Peripheral flow cytometry showed CD5 positive, CD23 positive clonal B-cell population CLL/SLL phenotype.  Patient has not required any treatment for CLL so far    There has been a progressiveReason leukocytosis/lymphocytosis since June 2023.  Absolute lymphocyte doubling time remains about 7 to 8 months.  Also patient noted to have progressive thrombocytopenia since December 2023.  No B symptoms. Patient has mutated IgH V.  FISH for CLL showed 38.5% of nuclei positive for T p53. Patient also noted to have polycythemia with hemoglobin between 17-18 since June 2024.  JAK2, CALR, MPL mutation negative.  EPO levels normal.  Patient is a non-smoker.  No overt evidence of myeloproliferative neoplasm noted on bone marrow biopsy.62% bone marrow involvement with CLL noted in July 2024  Interval history-she has been feeling poorly over the last 2 months.Appetite is poor and she has lost about 10 pounds in the last 2 months  ECOG PS- 1 Pain scale- 0   Review  of systems- Review of Systems  Constitutional:  Positive for malaise/fatigue and weight loss. Negative for chills and fever.  HENT:  Negative for congestion, ear discharge and nosebleeds.   Eyes:  Negative for blurred vision.  Respiratory:  Negative for cough, hemoptysis, sputum production, shortness of breath and wheezing.   Cardiovascular:  Negative for chest pain, palpitations, orthopnea and claudication.  Gastrointestinal:  Negative for abdominal pain, blood in stool, constipation, diarrhea, heartburn, melena, nausea and vomiting.  Genitourinary:  Negative for dysuria, flank pain, frequency, hematuria and urgency.  Musculoskeletal:  Negative for back pain, joint pain and myalgias.  Skin:  Negative for rash.  Neurological:  Negative for dizziness, tingling, focal weakness, seizures, weakness and headaches.  Endo/Heme/Allergies:  Does not bruise/bleed easily.  Psychiatric/Behavioral:  Negative for depression and suicidal ideas. The patient does not have insomnia.       Allergies  Allergen Reactions   Sulfa Antibiotics Nausea Only and Nausea And Vomiting     Past Medical History:  Diagnosis Date   B12 deficiency    Back pain    CLL (chronic lymphocytic leukemia) (HCC) 06/21/2016   Rai stage 0, dx 06/2016 (Dr Smith Robert)   HLD (hyperlipidemia)    Joint pain    Knee problem    Macular degeneration of both eyes    OSA on CPAP 2006, 04/2014   AHI of 23.8 and an RDI of 42.9, on CPAP 9 cm H2O (Dohmeier)   Osteopenia 08/2010   T -1.4 at L femur neck   Piriformis syndrome 12/14/2014   Vitamin D deficiency      Past Surgical History:  Procedure Laterality Date   ABDOMINAL HYSTERECTOMY  01/27/1983   for menometrorrhagia, one ovary remained   CHOLECYSTECTOMY N/A 08/01/2018   LAPAROSCOPIC CHOLECYSTECTOMY; for gallstone pancreatitis  Lady Gary, Victorino Dike, MD)   COLONOSCOPY  2012   no records available. per patient normal.    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, fitness trainer  Tobacco Use   Smoking status: Former    Current packs/day: 0.00    Average packs/day: 2.0 packs/day for 20.0 years (40.0 ttl pk-yrs)    Types: Cigarettes    Start date: 01/26/1958    Quit date: 01/26/1978    Years since quitting: 45.1   Smokeless tobacco: Never  Vaping Use   Vaping status: Never Used  Substance and Sexual Activity   Alcohol use: Yes    Comment: occasionally   Drug use: Never   Sexual activity: Yes  Other Topics Concern   Not on file  Social History Narrative   Lives with daughter, 1 dog   Occupation: retired, has worked in Pharmacologist, also Optometrist   Edu: 1.5 yrs college   Activity: square dancing   Diet: good water, fruits/vegetables daily   Patient drinks two cups of caffeine daily.   Remarried.     Social Drivers of Corporate investment banker Strain: Low Risk  (10/24/2019)   Overall Financial Resource Strain (CARDIA)    Difficulty of Paying Living Expenses: Not hard at all  Food Insecurity: No Food Insecurity (10/24/2019)   Hunger Vital Sign    Worried About Running Out of Food in the Last Year: Never true    Ran Out of Food in the Last Year: Never true  Transportation Needs: No Transportation Needs (10/24/2019)   PRAPARE - Administrator, Civil Service (Medical): No    Lack of Transportation (Non-Medical): No  Physical Activity: Inactive (10/24/2019)   Exercise Vital Sign    Days of Exercise per Week: 0 days    Minutes of Exercise per Session: 0 min  Stress: No Stress Concern Present (10/24/2019)   [employer] of Occupational Health - Occupational Stress Questionnaire    Feeling of Stress : Not at all  Social  Connections: Not on file  Intimate Partner Violence: Not At Risk (10/24/2019)   Humiliation, Afraid, Rape, and Kick questionnaire    Fear of Current or Ex-Partner: No    Emotionally Abused: No    Physically Abused: No    Sexually Abused: No    Family History  Problem Relation Age of Onset   Hyperlipidemia Mother    CAD Mother        CABG   Hypertension Mother    Hyperlipidemia Father    CAD Father        MI   Sudden death Father    Cancer Sister        breast   Breast cancer Sister 35   Sleep apnea Brother    Sleep apnea Brother    Sleep apnea Brother    Arthritis Maternal Grandmother    Alzheimer's disease Other    Heart attack Other    Diabetes Neg Hx    Stroke Neg  Hx      Current Outpatient Medications:    allopurinol (ZYLOPRIM) 300 MG tablet, Take 1 tablet (300 mg total) by mouth daily., Disp: 30 tablet, Rfl: 2   aspirin EC 81 MG tablet, Take 81 mg by mouth daily. Swallow whole., Disp: , Rfl:    fluticasone (FLONASE) 50 MCG/ACT nasal spray, PLACE 2 SPRAYS INTO BOTH NOSTRILS DAILY AS NEEDED (Patient taking differently: Place 2 sprays into both nostrils as needed.), Disp: 16 mL, Rfl: 0   acalabrutinib maleate (CALQUENCE) 100 MG tablet, Take 1 tablet (100 mg total) by mouth 2 (two) times daily., Disp: 60 tablet, Rfl: 1   Cholecalciferol (VITAMIN D) 125 MCG (5000 UT) CAPS, Take 1 capsule by mouth daily. (Patient not taking: Reported on 03/09/2023), Disp: 30 capsule, Rfl:    Cyanocobalamin (VITAMIN B-12) 2500 MCG SUBL, Place 1 tablet (2,500 mcg total) under the tongue daily. (Patient not taking: Reported on 09/15/2022), Disp: , Rfl: 0   ivermectin (STROMECTOL) 3 MG TABS tablet, Take 1 tablet by mouth once as needed., Disp: , Rfl:    Multiple Vitamins-Minerals (PRESERVISION AREDS) TABS, Take 2 tablets by mouth daily. (Patient not taking: Reported on 03/09/2023), Disp: , Rfl:   Physical exam:  Vitals:   03/09/23 1349  BP: 135/76  Pulse: 65  Resp: 20  Temp: (!) 96.6 F (35.9  C)  SpO2: 100%  Weight: 158 lb 11.2 oz (72 kg)   Physical Exam Cardiovascular:     Rate and Rhythm: Normal rate and regular rhythm.     Heart sounds: Normal heart sounds.  Pulmonary:     Effort: Pulmonary effort is normal.     Breath sounds: Normal breath sounds.  Abdominal:     General: Bowel sounds are normal.     Palpations: Abdomen is soft.     Comments: No palpable splenomegaly  Lymphadenopathy:     Comments: Palpable nonbulky right supraclavicular and axillary adenopathy  Skin:    General: Skin is warm and dry.  Neurological:     Mental Status: She is alert and oriented to person, place, and time.         Latest Ref Rng & Units 03/09/2023    1:29 PM  CMP  Glucose 70 - 99 mg/dL 85   BUN 8 - 23 mg/dL 13   Creatinine 9.14 - 1.00 mg/dL 7.82   Sodium 956 - 213 mmol/L 136   Potassium 3.5 - 5.1 mmol/L 4.4   Chloride 98 - 111 mmol/L 101   CO2 22 - 32 mmol/L 25   Calcium 8.9 - 10.3 mg/dL 8.9   Total Protein 6.5 - 8.1 g/dL 7.1   Total Bilirubin 0.0 - 1.2 mg/dL 1.0   Alkaline Phos 38 - 126 U/L 81   AST 15 - 41 U/L 24   ALT 0 - 44 U/L 14       Latest Ref Rng & Units 03/09/2023    1:29 PM  CBC  WBC 4.0 - 10.5 K/uL 104.3   Hemoglobin 12.0 - 15.0 g/dL 08.6   Hematocrit 57.8 - 46.0 % 51.8   Platelets 150 - 400 K/uL 43      Assessment and plan- Patient is a 78 y.o. female with Rai stage IV CLL here to discuss further management  White cell count has gone up significantly from 68 3 months prior presently 204 with an absolute lymphocyte doubling time of less than 6 months.  Thrombocytopenia has worsened further and platelet counts are down to 43 today.  She also has ongoing B symptoms including loss of weight and appetite.  Clinically she does not have any bulky adenopathy or splenomegaly.  She had a PET scan in September 2024 which also showed normal-appearing spleen and bone marrow and mildly enlarged axillary retroperitoneal iliac lymph nodes.  Given that she has T p53  mutation I would recommend single agent acalabrutinib until progression or toxicity.  Zanubrutinib was not approved by her insurance.Discussed risks and benefits of acalabrutinib including all but not limited to nausea vomiting low blood counts risk of infections and hospitalization as well as bleeding and thromboembolism.  I will plan to check CBC CMP LDH and uric acid in 2 weeks in 4 weeks and I will see him back in 4 weeks.  I am starting her on allopurinol prophylaxis.  In a multicenter phase 3 trial ([facility]ATE-[facility]), 535 older or frail adults with treatment-nave were randomly assigned 1:1:1 to receive six cycles of acalabrutinib plus obinutuzumab followed by single-agent acalabrutinib, single-agent acalabrutinib alone, or six cycles of chlorambucil plus obintuzumab. Acalabrutinib was continued until progression or unacceptable toxicity.Among the 61 patients with 17p deletion or TP53 mutation, the estimated five-year PFS in the two acalabrutinib-containing arms was 71 versus 18 percent with chlorambucil plus obinutuzumab.On subgroup analyses, the improved efficacy with ?bi?ut?z?m?b was not demonstrated in patients with del(17p) or ??53 mutation, although the confidence in these results is limited by a small sample size.    Cancer Staging  CLL (chronic lymphocytic leukemia) (HCC) Staging form: Chronic Lymphocytic Leukemia / Small Lymphocytic Lymphoma, AJCC 8th Edition - Clinical stage from 03/09/2023: Modified Rai Stage IV (Modified Rai risk: High, Binet: Stage B, Lymphocytosis: Present, Adenopathy: Present, Organomegaly: Absent, Anemia: Absent, Thrombocytopenia: Present) - Signed by Creig Hines, MD on 03/09/2023 Stage prefix: Initial diagnosis     Visit Diagnosis 1. CLL (chronic lymphocytic leukemia) (HCC)   2. Goals of care, counseling/discussion      Dr. Owens Shark, MD, MPH [facility] at [facility] [phone] 03/09/2023 4:02 PM

## 2023-03-09 NOTE — Progress Notes (Addendum)
Clinical Pharmacist Practitioner [facility]  Telephone:([phone] Fax:(336) [phone]  Patient Care Team: Eustaquio Boyden, MD as PCP - General (Family Medicine) Etta Quill, MD as Attending Physician (Ophthalmology) Lelon Perla, DDS as Consulting Physician (Dentistry) Creig Hines, MD as Consulting Physician (Hematology and Oncology)   Name of the patient: Lori Shaw  [id number]  30-Sep-1945   Date of visit: 03/09/23  HPI: Patient is a 78 y.o. female with CLL, now requiring treatment. Patient is now considering starting treatment with Calquece (acalabrutinib).  Reason for Consult: Acalabrutinib oral chemotherapy education.   PAST MEDICAL HISTORY: Past Medical History:  Diagnosis Date   B12 deficiency    Back pain    CLL (chronic lymphocytic leukemia) (HCC) 06/21/2016   Rai stage 0, dx 06/2016 (Dr Smith Robert)   HLD (hyperlipidemia)    Joint pain    Knee problem    Macular degeneration of both eyes    OSA on CPAP 2006, 04/2014   AHI of 23.8 and an RDI of 42.9, on CPAP 9 cm H2O (Dohmeier)   Osteopenia 08/2010   T -1.4 at L femur neck   Piriformis syndrome 12/14/2014   Vitamin D deficiency     HEMATOLOGY/ONCOLOGY HISTORY:  Oncology History   No history exists.    ALLERGIES:  is allergic to sulfa antibiotics.  MEDICATIONS:  Current Outpatient Medications  Medication Sig Dispense Refill   acalabrutinib maleate (CALQUENCE) 100 MG tablet Take 1 tablet (100 mg total) by mouth 2 (two) times daily. 60 tablet 1   ivermectin (STROMECTOL) 3 MG TABS tablet Take 1 tablet by mouth once as needed.     allopurinol (ZYLOPRIM) 300 MG tablet Take 1 tablet (300 mg total) by mouth daily. 30 tablet 2   aspirin EC 81 MG tablet Take 81 mg by mouth daily. Swallow whole.     Cholecalciferol (VITAMIN D) 125 MCG (5000 UT) CAPS Take 1 capsule by mouth daily. (Patient not taking: Reported on 03/09/2023) 30 capsule    Cyanocobalamin (VITAMIN B-12) 2500 MCG SUBL  Place 1 tablet (2,500 mcg total) under the tongue daily. (Patient not taking: Reported on 09/15/2022)  0   fluticasone (FLONASE) 50 MCG/ACT nasal spray PLACE 2 SPRAYS INTO BOTH NOSTRILS DAILY AS NEEDED (Patient taking differently: Place 2 sprays into both nostrils as needed.) 16 mL 0   Multiple Vitamins-Minerals (PRESERVISION AREDS) TABS Take 2 tablets by mouth daily. (Patient not taking: Reported on 03/09/2023)     No current facility-administered medications for this visit.    VITAL SIGNS: There were no vitals taken for this visit. There were no vitals filed for this visit.  Estimated body mass index is 26.41 kg/m as calculated from the following:   Height as of 09/15/22: 5\' 5"  (1.651 m).   Weight as of an earlier encounter on 03/09/23: 72 kg (158 lb 11.2 oz).  LABS: CBC:    Component Value Date/Time   WBC 104.3 (HH) 03/09/2023 1329   HGB 16.8 (H) 03/09/2023 1329   HGB 18.0 (H) 08/18/2022 0825   HGB 14.4 02/02/2020 1448   HGB 14.5 04/10/2010 0000   HCT 51.8 (H) 03/09/2023 1329   HCT 43.3 02/02/2020 1448   PLT 43 (L) 03/09/2023 1329   PLT 67 (L) 08/18/2022 0825   PLT 168 02/02/2020 1448   MCV 97.4 03/09/2023 1329   MCV 91 02/02/2020 1448   NEUTROABS 3.3 03/09/2023 1329   NEUTROABS 3.3 11/25/2017 1208   LYMPHSABS 71.8 (H) 03/09/2023 1329   LYMPHSABS 8.7 (  H) 11/25/2017 1208   MONOABS 26.8 (H) 03/09/2023 1329   EOSABS 0.7 (H) 03/09/2023 1329   EOSABS 0.3 11/25/2017 1208   BASOSABS 1.4 (H) 03/09/2023 1329   BASOSABS 0.1 11/25/2017 1208   Comprehensive Metabolic Panel:    Component Value Date/Time   NA 136 03/09/2023 1329   NA 142 02/02/2020 1448   K 4.4 03/09/2023 1329   CL 101 03/09/2023 1329   CO2 25 03/09/2023 1329   BUN 13 03/09/2023 1329   BUN 11 02/02/2020 1448   CREATININE 0.86 03/09/2023 1329   CREATININE 0.86 07/10/2022 1050   GLUCOSE 85 03/09/2023 1329   CALCIUM 8.9 03/09/2023 1329   AST 24 03/09/2023 1329   AST 26 07/10/2022 1050   ALT 14 03/09/2023 1329    ALT 20 07/10/2022 1050   ALKPHOS 81 03/09/2023 1329   ALKPHOS 79 04/10/2010 0000   BILITOT 1.0 03/09/2023 1329   BILITOT 1.0 07/10/2022 1050   PROT 7.1 03/09/2023 1329   PROT 6.2 02/02/2020 1448   ALBUMIN 4.4 03/09/2023 1329   ALBUMIN 4.6 02/02/2020 1448     Present during today's visit: Patient and her husband   Start plan: Plan to start 2/13-2/14 pending medication delivery    Patient Education I spoke with patient for overview of new oral chemotherapy medication: acalabrutinib    She will also start allopurinol 300 mg by mouth daily for the prevention of tumor lysis. Patient is aware that prescription has been sent to her preferred pharmacy.   Administration: Counseled patient on administration, dosing, side effects, monitoring, drug-food interactions, safe handling, storage, and disposal.  Side Effects: Side effects include but not limited to: headache, muscle pain, bleeding, decreased wbc/hgb/plt, and fatigue.    Drug-drug Interactions (DDI): Aspirin: Acalabrutinib may enhance the antiplatelet effect of agents with antiplatelet properties. Monitor patients for signs and symptoms of bleeding.  Adherence: After discussion with patient no patient barriers to medication adherence identified.  Reviewed with patient importance of keeping a medication schedule and plan for any missed doses.  Mrs. Emily voiced understanding and appreciation. All questions answered. Medication handout provided.  Provided patient with Oral Chemotherapy Navigation Clinic phone number. Patient knows to call the office with questions or concerns. Oral Chemotherapy Navigation Clinic will continue to follow.  Patient expressed understanding and was in agreement with this plan. She also understands that She can call clinic at any time with any questions, concerns, or complaints.   Medication Access Issues: None, confirmed correct home address for shipment of medication  Follow-up plan: RTC in 2  weeks   Thank you for allowing me to participate in the care of this patient.   Time Total: 15 mins  Visit consisted of counseling and education on dealing with issues of symptom management in the setting of serious and potentially life-threatening illness.Greater than 50% of this time was spent counseling and coordinating care related to the above assessment and plan.  Signed by: Jerry Caras, PharmD PGY2 Oncology Pharmacy Resident   03/09/2023 2:46 PM

## 2023-03-09 NOTE — Progress Notes (Signed)
Patient education documented in EPIC note on 03/09/23.

## 2023-03-10 ENCOUNTER — Other Ambulatory Visit: Payer: Self-pay

## 2023-03-23 ENCOUNTER — Inpatient Hospital Stay: Payer: Medicare HMO | Admitting: Pharmacist

## 2023-03-23 ENCOUNTER — Telehealth: Payer: Self-pay | Admitting: *Deleted

## 2023-03-23 ENCOUNTER — Encounter: Payer: Self-pay | Admitting: Pharmacist

## 2023-03-23 ENCOUNTER — Inpatient Hospital Stay: Payer: Medicare HMO

## 2023-03-23 VITALS — BP 110/57 | HR 75 | Temp 98.1°F | Resp 16 | Wt 157.0 lb

## 2023-03-23 DIAGNOSIS — C9110 Chronic lymphocytic leukemia of B-cell type not having achieved remission: Secondary | ICD-10-CM

## 2023-03-23 DIAGNOSIS — Z87891 Personal history of nicotine dependence: Secondary | ICD-10-CM | POA: Diagnosis not present

## 2023-03-23 LAB — CMP (CANCER CENTER ONLY)
ALT: 20 U/L (ref 0–44)
AST: 22 U/L (ref 15–41)
Albumin: 4.1 g/dL (ref 3.5–5.0)
Alkaline Phosphatase: 56 U/L (ref 38–126)
Anion gap: 8 (ref 5–15)
BUN: 18 mg/dL (ref 8–23)
CO2: 25 mmol/L (ref 22–32)
Calcium: 8.7 mg/dL — ABNORMAL LOW (ref 8.9–10.3)
Chloride: 105 mmol/L (ref 98–111)
Creatinine: 0.78 mg/dL (ref 0.44–1.00)
GFR, Estimated: >60 mL/min (ref >60)
Glucose, Bld: 86 mg/dL (ref 70–99)
Potassium: 4.4 mmol/L (ref 3.5–5.1)
Sodium: 138 mmol/L (ref 135–145)
Total Bilirubin: 0.8 mg/dL (ref 0.0–1.2)
Total Protein: 6.5 g/dL (ref 6.5–8.1)

## 2023-03-23 LAB — CBC WITH DIFFERENTIAL (CANCER CENTER ONLY)
Abs Immature Granulocytes: 0.15 10*3/uL — ABNORMAL HIGH (ref 0.00–0.07)
Basophils Absolute: 0.7 10*3/uL — ABNORMAL HIGH (ref 0.0–0.1)
Basophils Relative: 1 %
Eosinophils Absolute: 0.3 10*3/uL (ref 0.0–0.5)
Eosinophils Relative: 1 %
HCT: 49.5 % — ABNORMAL HIGH (ref 36.0–46.0)
Hemoglobin: 16.3 g/dL — ABNORMAL HIGH (ref 12.0–15.0)
Immature Granulocytes: 0 %
Lymphocytes Relative: 88 %
Lymphs Abs: 64.5 10*3/uL — ABNORMAL HIGH (ref 0.7–4.0)
MCH: 32.2 pg (ref 26.0–34.0)
MCHC: 32.9 g/dL (ref 30.0–36.0)
MCV: 97.8 fL (ref 80.0–100.0)
Monocytes Absolute: 4.7 10*3/uL — ABNORMAL HIGH (ref 0.1–1.0)
Monocytes Relative: 6 %
Neutro Abs: 2.8 10*3/uL (ref 1.7–7.7)
Neutrophils Relative %: 4 %
Platelet Count: 69 10*3/uL — ABNORMAL LOW (ref 150–400)
RBC: 5.06 MIL/uL (ref 3.87–5.11)
RDW: 15.9 % — ABNORMAL HIGH (ref 11.5–15.5)
Smear Review: NORMAL
WBC Count: 73.1 10*3/uL (ref 4.0–10.5)
nRBC: 0 % (ref 0.0–0.2)

## 2023-03-23 LAB — PHOSPHORUS: Phosphorus: 4.2 mg/dL (ref 2.5–4.6)

## 2023-03-23 LAB — URIC ACID: Uric Acid, Serum: 5.1 mg/dL (ref 2.5–7.1)

## 2023-03-23 LAB — LACTATE DEHYDROGENASE: LDH: 142 U/L (ref 98–192)

## 2023-03-23 NOTE — Telephone Encounter (Signed)
 Spoke with Celso Sickle in lab who reported that patient WBC 73.1.

## 2023-03-23 NOTE — Progress Notes (Signed)
 Pt reports stopped taking allopurinol, states it was making her knees, hips and ankles hurt.

## 2023-03-23 NOTE — Progress Notes (Signed)
 Clinical Pharmacist Practitioner Clinic [facility]  Telephone:([phone] [phone]  Patient Care Team: Eustaquio Boyden, MD as PCP - General (Family Medicine) Etta Quill, MD as Attending Physician (Ophthalmology) Lelon Perla, DDS as Consulting Physician (Dentistry) Creig Hines, MD as Consulting Physician (Hematology and Oncology)   Name of the patient: Lori Shaw  [id number]  01-Dec-1945   Date of visit: 03/23/23  HPI: Patient is a 78 y.o. female with CLL. She started acalabrutinib on 03/11/23.   Reason for Consult: Oral chemotherapy follow-up for acalabrutinib therapy.   PAST MEDICAL HISTORY: Past Medical History:  Diagnosis Date   B12 deficiency    Back pain    CLL (chronic lymphocytic leukemia) (HCC) 06/21/2016   Rai stage 0, dx 06/2016 (Dr Smith Robert)   HLD (hyperlipidemia)    Joint pain    Knee problem    Macular degeneration of both eyes    OSA on CPAP 2006, 04/2014   AHI of 23.8 and an RDI of 42.9, on CPAP 9 cm H2O (Dohmeier)   Osteopenia 08/2010   T -1.4 at L femur neck   Piriformis syndrome 12/14/2014   Vitamin D deficiency     HEMATOLOGY/ONCOLOGY HISTORY:  Oncology History  CLL (chronic lymphocytic leukemia) (HCC)  06/21/2016 Initial Diagnosis   CLL (chronic lymphocytic leukemia) (HCC)   03/09/2023 Cancer Staging   Staging form: Chronic Lymphocytic Leukemia / Small Lymphocytic Lymphoma, AJCC 8th Edition - Clinical stage from 03/09/2023: Modified Rai Stage IV (Modified Rai risk: High, Binet: Stage B, Lymphocytosis: Present, Adenopathy: Present, Organomegaly: Absent, Anemia: Absent, Thrombocytopenia: Present) - Signed by Creig Hines, MD on 03/09/2023 Stage prefix: Initial diagnosis     ALLERGIES:  is allergic to sulfa antibiotics.  MEDICATIONS:  Current Outpatient Medications  Medication Sig Dispense Refill   acalabrutinib maleate (CALQUENCE) 100 MG tablet Take 1 tablet (100 mg total) by mouth 2 (two) times daily.  60 tablet 1   allopurinol (ZYLOPRIM) 300 MG tablet Take 1 tablet (300 mg total) by mouth daily. (Patient not taking: Reported on 03/23/2023) 30 tablet 2   aspirin EC 81 MG tablet Take 81 mg by mouth daily. Swallow whole. (Patient not taking: Reported on 03/23/2023)     Cholecalciferol (VITAMIN D) 125 MCG (5000 UT) CAPS Take 1 capsule by mouth daily. (Patient not taking: Reported on 09/15/2022) 30 capsule    Cyanocobalamin (VITAMIN B-12) 2500 MCG SUBL Place 1 tablet (2,500 mcg total) under the tongue daily. (Patient not taking: Reported on 09/15/2022)  0   fluticasone (FLONASE) 50 MCG/ACT nasal spray PLACE 2 SPRAYS INTO BOTH NOSTRILS DAILY AS NEEDED (Patient not taking: Reported on 03/23/2023) 16 mL 0   ivermectin (STROMECTOL) 3 MG TABS tablet Take 1 tablet by mouth once as needed. (Patient not taking: Reported on 03/23/2023)     Multiple Vitamins-Minerals (PRESERVISION AREDS) TABS Take 2 tablets by mouth daily. (Patient not taking: Reported on 03/23/2023)     No current facility-administered medications for this visit.    VITAL SIGNS: BP (!) 110/57 (BP Location: Left Arm, Patient Position: Sitting)   Pulse 75   Temp 98.1 F (36.7 C) (Oral)   Resp 16   Wt 71.2 kg (157 lb)   SpO2 97%   BMI 26.13 kg/m  Filed Weights   03/23/23 1400  Weight: 71.2 kg (157 lb)    Estimated body mass index is 26.13 kg/m as calculated from the following:   Height as of 09/15/22: 5\' 5"  (1.651 m).   Weight as of  this encounter: 71.2 kg (157 lb).  LABS: CBC:    Component Value Date/Time   WBC 73.1 (HH) 03/23/2023 1320   WBC 104.3 (HH) 03/09/2023 1329   HGB 16.3 (H) 03/23/2023 1320   HGB 14.4 02/02/2020 1448   HGB 14.5 04/10/2010 0000   HCT 49.5 (H) 03/23/2023 1320   HCT 43.3 02/02/2020 1448   PLT 69 (L) 03/23/2023 1320   PLT 168 02/02/2020 1448   MCV 97.8 03/23/2023 1320   MCV 91 02/02/2020 1448   NEUTROABS 2.8 03/23/2023 1320   NEUTROABS 3.3 11/25/2017 1208   LYMPHSABS 64.5 (H) 03/23/2023 1320    LYMPHSABS 8.7 (H) 11/25/2017 1208   MONOABS 4.7 (H) 03/23/2023 1320   EOSABS 0.3 03/23/2023 1320   EOSABS 0.3 11/25/2017 1208   BASOSABS 0.7 (H) 03/23/2023 1320   BASOSABS 0.1 11/25/2017 1208   Comprehensive Metabolic Panel:    Component Value Date/Time   NA 138 03/23/2023 1320   NA 142 02/02/2020 1448   K 4.4 03/23/2023 1320   CL 105 03/23/2023 1320   CO2 25 03/23/2023 1320   BUN 18 03/23/2023 1320   BUN 11 02/02/2020 1448   CREATININE 0.78 03/23/2023 1320   GLUCOSE 86 03/23/2023 1320   CALCIUM 8.7 (L) 03/23/2023 1320   AST 22 03/23/2023 1320   ALT 20 03/23/2023 1320   ALKPHOS 56 03/23/2023 1320   ALKPHOS 79 04/10/2010 0000   BILITOT 0.8 03/23/2023 1320   PROT 6.5 03/23/2023 1320   PROT 6.2 02/02/2020 1448   ALBUMIN 4.1 03/23/2023 1320   ALBUMIN 4.6 02/02/2020 1448     Present during today's visit: Patient and her husband   Assessment and Plan: CBC/CMP reviewed. Wbc decreased from 104.3 to 73.1 today, indicative of initial response. Patient is overall feeling better since starting TKI therapy. Continue acalabrutinib 100 mg PO BID Allopurinol was prescribed during last visit for tumor lysis prevention. Patient was not able to tolerate allopurinol stating that it caused myalgias in her ankles and hips.  Initial CMP shows no significant findings of TLS despite not receiving allopurinol. Uric acid and phosphorus from today also WNL.  Given no s/sx TLS, will plan to proceed with acalabrutinib therapy with TLS lab monitoring only.    Oral Chemotherapy Side Effect/Intolerance:  Headache: Patient reports having some headaches, but she has been able to manage these with caffeine and Aleve as needed.  Fatigue: Patient continues to endorse fatigue; however, she notes this has improved since her last visit. She has been able to tolerate more ADLs and do more around the house.   Bruising: Mild bruising noted on right arm after patient reported bumping it. Patient denies any significant  bleeding. She knows to contact the clinic if her bruising significantly worsens.   Oral Chemotherapy Adherence:  No patient barriers to medication adherence identified.   New medications: None  Medication Access Issues: None, fills at [facility]   Patient expressed understanding and was in agreement with this plan. She also understands that She can call clinic at any time with any questions, concerns, or complaints.   Follow-up plan: RTC as scheduled   Thank you for allowing me to participate in the care of this very pleasant patient.   Time Total: 15 min  Visit consisted of counseling and education on dealing with issues of symptom management in the setting of serious and potentially life-threatening illness. Greater than 50% of this time was spent counseling and coordinating care related to the above assessment and plan.  Signed  by: Jerry Caras, PharmD PGY2 Oncology Pharmacy Resident   03/23/2023 2:53 PM

## 2023-03-25 ENCOUNTER — Other Ambulatory Visit: Payer: Self-pay

## 2023-03-30 ENCOUNTER — Other Ambulatory Visit (HOSPITAL_COMMUNITY): Payer: Self-pay

## 2023-04-02 ENCOUNTER — Other Ambulatory Visit: Payer: Self-pay

## 2023-04-02 ENCOUNTER — Other Ambulatory Visit (HOSPITAL_COMMUNITY): Payer: Self-pay

## 2023-04-02 NOTE — Progress Notes (Signed)
 Specialty Pharmacy Refill Coordination Note  Lori Shaw is a 78 y.o. female contacted today regarding refills of specialty medication(s) No data recorded  Patient requested (Patient-Rptd) Delivery   Delivery date: (Patient-Rptd) 04/07/23   Verified address: (Patient-Rptd) [address] [place] [address]   Medication will be filled on 04/06/23.

## 2023-04-13 ENCOUNTER — Other Ambulatory Visit: Payer: Self-pay

## 2023-04-14 ENCOUNTER — Inpatient Hospital Stay: Payer: Medicare HMO | Attending: Oncology

## 2023-04-14 ENCOUNTER — Inpatient Hospital Stay: Payer: Medicare HMO | Admitting: Oncology

## 2023-04-14 ENCOUNTER — Encounter: Payer: Self-pay | Admitting: Oncology

## 2023-04-14 ENCOUNTER — Other Ambulatory Visit: Payer: Self-pay

## 2023-04-14 VITALS — BP 109/65 | HR 77 | Temp 98.4°F | Resp 18 | Ht 64.0 in | Wt 157.7 lb

## 2023-04-14 DIAGNOSIS — M8580 Other specified disorders of bone density and structure, unspecified site: Secondary | ICD-10-CM | POA: Insufficient documentation

## 2023-04-14 DIAGNOSIS — Z87891 Personal history of nicotine dependence: Secondary | ICD-10-CM | POA: Insufficient documentation

## 2023-04-14 DIAGNOSIS — Z79899 Other long term (current) drug therapy: Secondary | ICD-10-CM | POA: Insufficient documentation

## 2023-04-14 DIAGNOSIS — C9110 Chronic lymphocytic leukemia of B-cell type not having achieved remission: Secondary | ICD-10-CM | POA: Diagnosis not present

## 2023-04-14 DIAGNOSIS — E559 Vitamin D deficiency, unspecified: Secondary | ICD-10-CM | POA: Diagnosis not present

## 2023-04-14 DIAGNOSIS — E538 Deficiency of other specified B group vitamins: Secondary | ICD-10-CM | POA: Insufficient documentation

## 2023-04-14 DIAGNOSIS — R5383 Other fatigue: Secondary | ICD-10-CM

## 2023-04-14 LAB — CBC WITH DIFFERENTIAL (CANCER CENTER ONLY)
Abs Immature Granulocytes: 0.11 10*3/uL — ABNORMAL HIGH (ref 0.00–0.07)
Basophils Absolute: 0.3 10*3/uL — ABNORMAL HIGH (ref 0.0–0.1)
Basophils Relative: 1 %
Eosinophils Absolute: 0.3 10*3/uL (ref 0.0–0.5)
Eosinophils Relative: 1 %
HCT: 46.8 % — ABNORMAL HIGH (ref 36.0–46.0)
Hemoglobin: 15.4 g/dL — ABNORMAL HIGH (ref 12.0–15.0)
Immature Granulocytes: 0 %
Lymphocytes Relative: 88 %
Lymphs Abs: 33.9 10*3/uL — ABNORMAL HIGH (ref 0.7–4.0)
MCH: 32 pg (ref 26.0–34.0)
MCHC: 32.9 g/dL (ref 30.0–36.0)
MCV: 97.3 fL (ref 80.0–100.0)
Monocytes Absolute: 0.6 10*3/uL (ref 0.1–1.0)
Monocytes Relative: 2 %
Neutro Abs: 3.2 10*3/uL (ref 1.7–7.7)
Neutrophils Relative %: 8 %
Platelet Count: 68 10*3/uL — ABNORMAL LOW (ref 150–400)
RBC: 4.81 MIL/uL (ref 3.87–5.11)
RDW: 14.4 % (ref 11.5–15.5)
Smear Review: NORMAL
WBC Count: 38.4 10*3/uL — ABNORMAL HIGH (ref 4.0–10.5)
nRBC: 0 % (ref 0.0–0.2)

## 2023-04-14 LAB — CMP (CANCER CENTER ONLY)
ALT: 15 U/L (ref 0–44)
AST: 20 U/L (ref 15–41)
Albumin: 4 g/dL (ref 3.5–5.0)
Alkaline Phosphatase: 52 U/L (ref 38–126)
Anion gap: 6 (ref 5–15)
BUN: 13 mg/dL (ref 8–23)
CO2: 26 mmol/L (ref 22–32)
Calcium: 8.9 mg/dL (ref 8.9–10.3)
Chloride: 105 mmol/L (ref 98–111)
Creatinine: 0.65 mg/dL (ref 0.44–1.00)
GFR, Estimated: >60 mL/min (ref >60)
Glucose, Bld: 98 mg/dL (ref 70–99)
Potassium: 3.9 mmol/L (ref 3.5–5.1)
Sodium: 137 mmol/L (ref 135–145)
Total Bilirubin: 1 mg/dL (ref 0.0–1.2)
Total Protein: 6.3 g/dL — ABNORMAL LOW (ref 6.5–8.1)

## 2023-04-14 LAB — VITAMIN B12: Vitamin B-12: 122 pg/mL — ABNORMAL LOW (ref 180–914)

## 2023-04-14 LAB — VITAMIN D 25 HYDROXY (VIT D DEFICIENCY, FRACTURES): Vit D, 25-Hydroxy: 47.02 ng/mL (ref 30–100)

## 2023-04-14 LAB — LACTATE DEHYDROGENASE: LDH: 135 U/L (ref 98–192)

## 2023-04-14 LAB — PHOSPHORUS: Phosphorus: 3.7 mg/dL (ref 2.5–4.6)

## 2023-04-14 LAB — URIC ACID: Uric Acid, Serum: 4.2 mg/dL (ref 2.5–7.1)

## 2023-04-15 ENCOUNTER — Other Ambulatory Visit: Payer: Self-pay | Admitting: Oncology

## 2023-04-15 ENCOUNTER — Inpatient Hospital Stay

## 2023-04-15 DIAGNOSIS — M8580 Other specified disorders of bone density and structure, unspecified site: Secondary | ICD-10-CM | POA: Diagnosis not present

## 2023-04-15 DIAGNOSIS — E559 Vitamin D deficiency, unspecified: Secondary | ICD-10-CM | POA: Diagnosis not present

## 2023-04-15 DIAGNOSIS — C9110 Chronic lymphocytic leukemia of B-cell type not having achieved remission: Secondary | ICD-10-CM | POA: Diagnosis not present

## 2023-04-15 DIAGNOSIS — Z87891 Personal history of nicotine dependence: Secondary | ICD-10-CM | POA: Diagnosis not present

## 2023-04-15 DIAGNOSIS — E538 Deficiency of other specified B group vitamins: Secondary | ICD-10-CM

## 2023-04-15 DIAGNOSIS — Z79899 Other long term (current) drug therapy: Secondary | ICD-10-CM | POA: Diagnosis not present

## 2023-04-15 MED ORDER — CYANOCOBALAMIN 1000 MCG/ML IJ SOLN
1000.0000 ug | Freq: Once | INTRAMUSCULAR | Status: AC
  Administered 2023-04-15: 1000 ug via INTRAMUSCULAR
  Filled 2023-04-15: qty 1

## 2023-04-18 ENCOUNTER — Encounter: Payer: Self-pay | Admitting: Oncology

## 2023-04-18 NOTE — Progress Notes (Signed)
 Hematology/Oncology Consult note [facility]  [phone] [phone]  Patient Care Team: Eustaquio Boyden, MD as PCP - General (Family Medicine) Etta Quill, MD as Attending Physician (Ophthalmology) Lelon Perla, DDS as Consulting Physician (Dentistry) Creig Hines, MD as Consulting Physician (Hematology and Oncology)   Name of the patient: Lori Shaw  [id number]  1945-06-18   Date of visit: 04/18/23  Diagnosis- Rai stage IV CLL   Chief complaint/ Reason for visit- routine f/u of CLL on acalibrutinib  Heme/Onc history: patient is a 78 year old female with no significant past medical history other than low B12 and low vitamin D levels. Patient was recently noted to have leukocytosis with a white count of 13. Differential mainly showed lymphocytosis. Recent CBC from 07/03/2016 showed white count of 13, H&H of 14.7/44 and a platelet count of 207. Differential showed 63.5% lymphocytes. Peripheral flow cytometry showed CD5 positive, CD23 positive clonal B-cell population CLL/SLL phenotype.  Patient has not required any treatment for CLL so far    There has been a progressiveReason leukocytosis/lymphocytosis since June 2023.  Absolute lymphocyte doubling time remains about 7 to 8 months.  Also patient noted to have progressive thrombocytopenia since December 2023.  No B symptoms. Patient has mutated IgH V.  FISH for CLL showed 38.5% of nuclei positive for T p53. Patient also noted to have polycythemia with hemoglobin between 17-18 since June 2024.  JAK2, CALR, MPL mutation negative.  EPO levels normal.  Patient is a non-smoker.  No overt evidence of myeloproliferative neoplasm noted on bone marrow biopsy.62% bone marrow involvement with CLL noted in July 2024.  Patient had progressive thrombocytopenia, worsening lymphocytosis and B symptoms. Mild axillary, retroperitoneal and iliac station adenopathy. No splenomegaly. Patient was  started on acalibrutinib in feb 2025  Interval history- Overall appetite is improving and patient is feeling better. Fatigue is better but not resolved  ECOG PS- 1 Pain scale- 0   Review of systems- Review of Systems  Constitutional:  Positive for malaise/fatigue. Negative for chills, fever and weight loss.  HENT:  Negative for congestion, ear discharge and nosebleeds.   Eyes:  Negative for blurred vision.  Respiratory:  Negative for cough, hemoptysis, sputum production, shortness of breath and wheezing.   Cardiovascular:  Negative for chest pain, palpitations, orthopnea and claudication.  Gastrointestinal:  Negative for abdominal pain, blood in stool, constipation, diarrhea, heartburn, melena, nausea and vomiting.  Genitourinary:  Negative for dysuria, flank pain, frequency, hematuria and urgency.  Musculoskeletal:  Negative for back pain, joint pain and myalgias.  Skin:  Negative for rash.  Neurological:  Negative for dizziness, tingling, focal weakness, seizures, weakness and headaches.  Endo/Heme/Allergies:  Does not bruise/bleed easily.  Psychiatric/Behavioral:  Negative for depression and suicidal ideas. The patient does not have insomnia.       Allergies  Allergen Reactions   Sulfa Antibiotics Nausea Only and Nausea And Vomiting     Past Medical History:  Diagnosis Date   B12 deficiency    Back pain    CLL (chronic lymphocytic leukemia) (HCC) 06/21/2016   Rai stage 0, dx 06/2016 (Dr Smith Robert)   HLD (hyperlipidemia)    Joint pain    Knee problem    Macular degeneration of both eyes    OSA on CPAP 2006, 04/2014   AHI of 23.8 and an RDI of 42.9, on CPAP 9 cm H2O (Dohmeier)   Osteopenia 08/2010   T -1.4 at L femur neck   Piriformis syndrome 12/14/2014  Vitamin D deficiency      Past Surgical History:  Procedure Laterality Date   ABDOMINAL HYSTERECTOMY  01/27/1983   for menometrorrhagia, one ovary remained   CHOLECYSTECTOMY N/A 08/01/2018   LAPAROSCOPIC  CHOLECYSTECTOMY; for gallstone pancreatitis  Lady Gary, Victorino Dike, MD)   COLONOSCOPY  2012   no records available. per patient normal.    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, fitness trainer  Tobacco Use   Smoking status: Former    Current packs/day: 0.00    Average packs/day: 2.0 packs/day for 20.0 years (40.0 ttl pk-yrs)    Types: Cigarettes    Start date: 01/26/1958    Quit date: 01/26/1978    Years since quitting: 45.2   Smokeless tobacco: Never  Vaping Use   Vaping status: Never Used  Substance and Sexual Activity   Alcohol use: Yes    Comment: occasionally   Drug use: Never   Sexual activity: Yes  Other Topics Concern   Not on file  Social History Narrative   Lives with daughter, 1 dog   Occupation: retired, has worked in Pharmacologist, also Optometrist   Edu: 1.5 yrs college   Activity: square dancing   Diet: good water, fruits/vegetables daily   Patient drinks two cups of caffeine daily.   Remarried.     Social Drivers of Corporate investment banker Strain: Low Risk  (10/24/2019)   Overall Financial Resource Strain (CARDIA)    Difficulty of Paying Living Expenses: Not hard at all  Food Insecurity: No Food Insecurity (10/24/2019)   Hunger Vital Sign    Worried About Running Out of Food in the Last Year: Never true    Ran Out of Food in the Last Year: Never true  Transportation Needs: No Transportation Needs (10/24/2019)   PRAPARE - Administrator, Civil Service (Medical): No    Lack of Transportation (Non-Medical): No  Physical Activity: Inactive (10/24/2019)   Exercise Vital Sign    Days of Exercise per Week: 0 days    Minutes of  Exercise per Session: 0 min  Stress: No Stress Concern Present (10/24/2019)   [employer] of Occupational Health - Occupational Stress Questionnaire    Feeling of Stress : Not at all  Social Connections: Not on file  Intimate Partner Violence: Not At Risk (10/24/2019)   Humiliation, Afraid, Rape, and Kick questionnaire    Fear of Current or Ex-Partner: No    Emotionally Abused: No    Physically Abused: No    Sexually Abused: No    Family History  Problem Relation Age of Onset   Hyperlipidemia Mother    CAD Mother        CABG   Hypertension Mother    Hyperlipidemia Father    CAD Father        MI   Sudden death Father    Cancer Sister        breast   Breast cancer Sister 35   Sleep apnea Brother    Sleep apnea Brother    Sleep apnea Brother    Arthritis Maternal Grandmother  Alzheimer's disease Other    Heart attack Other    Diabetes Neg Hx    Stroke Neg Hx      Current Outpatient Medications:    allopurinol (ZYLOPRIM) 300 MG tablet, Take 300 mg by mouth daily., Disp: , Rfl:    acalabrutinib maleate (CALQUENCE) 100 MG tablet, Take 1 tablet (100 mg total) by mouth 2 (two) times daily., Disp: 60 tablet, Rfl: 1   aspirin EC 81 MG tablet, Take 81 mg by mouth daily. Swallow whole., Disp: , Rfl:    Cholecalciferol (VITAMIN D) 125 MCG (5000 UT) CAPS, Take 1 capsule by mouth daily. (Patient not taking: Reported on 09/15/2022), Disp: 30 capsule, Rfl:    Cyanocobalamin (VITAMIN B-12) 2500 MCG SUBL, Place 1 tablet (2,500 mcg total) under the tongue daily. (Patient not taking: Reported on 09/15/2022), Disp: , Rfl: 0   fluticasone (FLONASE) 50 MCG/ACT nasal spray, PLACE 2 SPRAYS INTO BOTH NOSTRILS DAILY AS NEEDED, Disp: 16 mL, Rfl: 0   ivermectin (STROMECTOL) 3 MG TABS tablet, Take 1 tablet by mouth once as needed. (Patient not taking: Reported on 03/23/2023), Disp: , Rfl:    Multiple Vitamins-Minerals (PRESERVISION AREDS) TABS, Take 2 tablets by mouth daily. (Patient not taking:  Reported on 03/23/2023), Disp: , Rfl:   Physical exam:  Vitals:   04/14/23 1426  BP: 109/65  Pulse: 77  Resp: 18  Temp: 98.4 F (36.9 C)  TempSrc: Tympanic  SpO2: 99%  Weight: 157 lb 11.2 oz (71.5 kg)  Height: 5\' 4"  (1.626 m)   Physical Exam Cardiovascular:     Rate and Rhythm: Normal rate and regular rhythm.     Heart sounds: Normal heart sounds.  Pulmonary:     Effort: Pulmonary effort is normal.     Breath sounds: Normal breath sounds.  Skin:    General: Skin is warm and dry.  Neurological:     Mental Status: She is alert and oriented to person, place, and time.         Latest Ref Rng & Units 04/14/2023    2:02 PM  CMP  Glucose 70 - 99 mg/dL 98   BUN 8 - 23 mg/dL 13   Creatinine 1.61 - 1.00 mg/dL 0.96   Sodium 045 - 409 mmol/L 137   Potassium 3.5 - 5.1 mmol/L 3.9   Chloride 98 - 111 mmol/L 105   CO2 22 - 32 mmol/L 26   Calcium 8.9 - 10.3 mg/dL 8.9   Total Protein 6.5 - 8.1 g/dL 6.3   Total Bilirubin 0.0 - 1.2 mg/dL 1.0   Alkaline Phos 38 - 126 U/L 52   AST 15 - 41 U/L 20   ALT 0 - 44 U/L 15       Latest Ref Rng & Units 04/14/2023    2:01 PM  CBC  WBC 4.0 - 10.5 K/uL 38.4   Hemoglobin 12.0 - 15.0 g/dL 81.1   Hematocrit 91.4 - 46.0 % 46.8   Platelets 150 - 400 K/uL 68      Assessment and plan- Patient is a 78 y.o. female with h/o Rai stage IV CLL tp53 presently on acalibrutinib here for routine f/u   Patient started acalabrutinib in February 2025.  Overall B symptoms are improved and lymphocytosis has improved significantly as well.  Hemoglobin was paradoxically high at 17.4 a month ago and is now at the upper limit of normal at 15.4.  Bone marrow biopsy did not show any evidence of myeloproliferative neoplasm concomitantly with CLL.  Continue to monitor.  Platelet counts are still lagging and still in the 60s.  Unclear if there is a component of ITP superimposed on CLL.  I would like to give it some time and see if the platelet counts improve over the next 3  months.  I will continue monitoring her CBC once a month and see her back in 3 months.  Overall she is tolerating acalabrutinib well without any significant side effects.  Patient noted to have a low B12 level of 122.  We will start her on monthly B12 injections at this time   Visit Diagnosis 1. CLL (chronic lymphocytic leukemia) (HCC)   2. B12 deficiency   3. High risk medication use      Dr. Owens Shark, MD, MPH [facility] at [facility] [phone] 04/18/2023 10:22 AM

## 2023-04-28 ENCOUNTER — Telehealth: Payer: Self-pay | Admitting: Adult Health

## 2023-04-28 NOTE — Telephone Encounter (Signed)
 LVM and sent mychart msg informing pt of need to reschedule 05/26/23 appt - NP out

## 2023-05-06 ENCOUNTER — Other Ambulatory Visit: Payer: Self-pay

## 2023-05-06 ENCOUNTER — Other Ambulatory Visit: Payer: Self-pay | Admitting: Oncology

## 2023-05-06 DIAGNOSIS — C9110 Chronic lymphocytic leukemia of B-cell type not having achieved remission: Secondary | ICD-10-CM

## 2023-05-06 NOTE — Progress Notes (Signed)
 Specialty Pharmacy Refill Coordination Note  Lori Shaw is a 78 y.o. female contacted today regarding refills of specialty medication(s) Acalabrutinib Maleate (Calquence)   Patient requested (Patient-Rptd) Delivery   Delivery date: (Patient-Rptd) 05/10/23   Verified address: (Patient-Rptd) [address] [place] [address]   Medication will be filled on 04.11.25.   Sent refill request, will notify patient if delayed.

## 2023-05-07 ENCOUNTER — Encounter: Payer: Self-pay | Admitting: Oncology

## 2023-05-07 ENCOUNTER — Other Ambulatory Visit: Payer: Self-pay

## 2023-05-07 MED ORDER — CALQUENCE 100 MG PO TABS
100.0000 mg | ORAL_TABLET | Freq: Two times a day (BID) | ORAL | 1 refills | Status: DC
  Filled 2023-05-07: qty 60, 30d supply, fill #0
  Filled 2023-06-02: qty 60, 30d supply, fill #1

## 2023-05-17 ENCOUNTER — Inpatient Hospital Stay

## 2023-05-17 ENCOUNTER — Inpatient Hospital Stay: Attending: Oncology

## 2023-05-17 DIAGNOSIS — E538 Deficiency of other specified B group vitamins: Secondary | ICD-10-CM | POA: Insufficient documentation

## 2023-05-17 DIAGNOSIS — C9110 Chronic lymphocytic leukemia of B-cell type not having achieved remission: Secondary | ICD-10-CM | POA: Insufficient documentation

## 2023-05-17 DIAGNOSIS — D751 Secondary polycythemia: Secondary | ICD-10-CM

## 2023-05-17 LAB — CBC WITH DIFFERENTIAL (CANCER CENTER ONLY)
Abs Immature Granulocytes: 0.05 10*3/uL (ref 0.00–0.07)
Basophils Absolute: 0.1 10*3/uL (ref 0.0–0.1)
Basophils Relative: 0 %
Eosinophils Absolute: 0.1 10*3/uL (ref 0.0–0.5)
Eosinophils Relative: 1 %
HCT: 43.3 % (ref 36.0–46.0)
Hemoglobin: 14.3 g/dL (ref 12.0–15.0)
Immature Granulocytes: 0 %
Lymphocytes Relative: 81 %
Lymphs Abs: 19 10*3/uL — ABNORMAL HIGH (ref 0.7–4.0)
MCH: 32.7 pg (ref 26.0–34.0)
MCHC: 33 g/dL (ref 30.0–36.0)
MCV: 99.1 fL (ref 80.0–100.0)
Monocytes Absolute: 0.9 10*3/uL (ref 0.1–1.0)
Monocytes Relative: 4 %
Neutro Abs: 3.3 10*3/uL (ref 1.7–7.7)
Neutrophils Relative %: 14 %
Platelet Count: 124 10*3/uL — ABNORMAL LOW (ref 150–400)
RBC: 4.37 MIL/uL (ref 3.87–5.11)
RDW: 14.3 % (ref 11.5–15.5)
Smear Review: NORMAL
WBC Count: 23.4 10*3/uL — ABNORMAL HIGH (ref 4.0–10.5)
nRBC: 0.1 % (ref 0.0–0.2)

## 2023-05-17 LAB — CMP (CANCER CENTER ONLY)
ALT: 15 U/L (ref 0–44)
AST: 19 U/L (ref 15–41)
Albumin: 4 g/dL (ref 3.5–5.0)
Alkaline Phosphatase: 56 U/L (ref 38–126)
Anion gap: 8 (ref 5–15)
BUN: 18 mg/dL (ref 8–23)
CO2: 25 mmol/L (ref 22–32)
Calcium: 8.5 mg/dL — ABNORMAL LOW (ref 8.9–10.3)
Chloride: 106 mmol/L (ref 98–111)
Creatinine: 0.82 mg/dL (ref 0.44–1.00)
GFR, Estimated: >60 mL/min (ref >60)
Glucose, Bld: 88 mg/dL (ref 70–99)
Potassium: 3.7 mmol/L (ref 3.5–5.1)
Sodium: 139 mmol/L (ref 135–145)
Total Bilirubin: 0.7 mg/dL (ref 0.0–1.2)
Total Protein: 6.4 g/dL — ABNORMAL LOW (ref 6.5–8.1)

## 2023-05-17 MED ORDER — CYANOCOBALAMIN 1000 MCG/ML IJ SOLN
1000.0000 ug | INTRAMUSCULAR | Status: DC
  Administered 2023-05-17: 1000 ug via INTRAMUSCULAR
  Filled 2023-05-17: qty 1

## 2023-05-19 ENCOUNTER — Other Ambulatory Visit

## 2023-05-26 ENCOUNTER — Ambulatory Visit: Payer: Medicare HMO | Admitting: Adult Health

## 2023-05-28 ENCOUNTER — Telehealth: Payer: Self-pay | Admitting: *Deleted

## 2023-05-28 NOTE — Telephone Encounter (Signed)
 I called back to the patient and left a message that Dr. Randy Buttery wanted to see her on May 7 and she does not feel like the Calquence  is making the rash.  Says if she gets any itchy that she can use Benadryl.  Patient says that she has tried the cream of the Benadryl and I suggest that she does the pill Benadryl.  She is okay with that and I told her if she gets really worse and it is just you know driving you crazy just give us  another call alright and the patient is okay with this plan

## 2023-05-28 NOTE — Telephone Encounter (Signed)
 The patient has questions about meds and she also has some whelps on her legs.  I called the patient back and she did not answer but then 2 seconds she came back so she says that she started with the calquence  about 2 months ago.  She did get a mosquito bite and so it was itchy but it got a little bit better but then she started seeing this some itching and redness on her back her feet and the legs.  Now she thinks that this might be allergic to the medicine or side effect of the medicine.  Would like to have someone tell her what to do

## 2023-05-28 NOTE — Telephone Encounter (Signed)
 Can you call her? I dont think its calquence 

## 2023-06-02 ENCOUNTER — Inpatient Hospital Stay: Attending: Oncology | Admitting: Oncology

## 2023-06-02 ENCOUNTER — Encounter: Payer: Self-pay | Admitting: Oncology

## 2023-06-02 ENCOUNTER — Other Ambulatory Visit: Payer: Self-pay

## 2023-06-02 VITALS — BP 128/64 | HR 64 | Temp 96.0°F | Resp 18 | Ht 64.0 in | Wt 153.8 lb

## 2023-06-02 DIAGNOSIS — R21 Rash and other nonspecific skin eruption: Secondary | ICD-10-CM | POA: Diagnosis not present

## 2023-06-02 DIAGNOSIS — E538 Deficiency of other specified B group vitamins: Secondary | ICD-10-CM | POA: Diagnosis not present

## 2023-06-02 DIAGNOSIS — Z79899 Other long term (current) drug therapy: Secondary | ICD-10-CM | POA: Diagnosis not present

## 2023-06-02 DIAGNOSIS — C9110 Chronic lymphocytic leukemia of B-cell type not having achieved remission: Secondary | ICD-10-CM | POA: Insufficient documentation

## 2023-06-02 DIAGNOSIS — L270 Generalized skin eruption due to drugs and medicaments taken internally: Secondary | ICD-10-CM | POA: Diagnosis not present

## 2023-06-02 MED ORDER — TRIAMCINOLONE ACETONIDE 0.5 % EX OINT: 1.0000 | TOPICAL_OINTMENT | Freq: Two times a day (BID) | CUTANEOUS | 3 refills | Status: AC

## 2023-06-02 NOTE — Progress Notes (Signed)
 Hematology/Oncology Consult note [facility]  [phone] [phone]) [phone]  Patient Care Team: Claire Crick, MD as PCP - General (Family Medicine) Zaldivar, Renzo A, MD as Attending Physician (Ophthalmology) Alton Au, DDS as Consulting Physician (Dentistry) Avonne Boettcher, MD as Consulting Physician (Hematology and Oncology)   Name of the patient: Lori  Shaw  [id number]  September 23, 1945   Date of visit: 06/02/23  Diagnosis-Rai stage IV CLL  Chief complaint/ Reason for visit-acute visit for skin rash  Heme/Onc history: patient is a 78 year old female with no significant past medical history other than low B12 and low vitamin D  levels. Patient was recently noted to have leukocytosis with a white count of 13. Differential mainly showed lymphocytosis. Recent CBC from 07/03/2016 showed white count of 13, H&H of 14.7/44 and a platelet count of 207. Differential showed 63.5% lymphocytes. Peripheral flow cytometry showed CD5 positive, CD23 positive clonal B-cell population CLL/SLL phenotype.  Patient has not required any treatment for CLL so far    There has been a progressiveReason leukocytosis/lymphocytosis since June 2023.  Absolute lymphocyte doubling time remains about 7 to 8 months.  Also patient noted to have progressive thrombocytopenia since December 2023.  No B symptoms. Patient has mutated IgH V.  FISH for CLL showed 38.5% of nuclei positive for T p53. Patient also noted to have polycythemia with hemoglobin between 17-18 since June 2024.  JAK2, CALR, MPL mutation negative.  EPO levels normal.  Patient is a non-smoker.  No overt evidence of myeloproliferative neoplasm noted on bone marrow biopsy.62% bone marrow involvement with CLL noted in July 2024.   Patient had progressive thrombocytopenia, worsening lymphocytosis and B symptoms. Mild axillary, retroperitoneal and iliac station adenopathy. No splenomegaly. Patient was started on  acalibrutinib in feb 2025  Interval history-patient has been on acalabrutinib  100 mg twice daily for about 3 months now.  Over the last 2 weeks patient has noticed erythematous skin rash which is mainly over her lower extremities and arm and a few areas over her back.  These areas are intensely pruritic and some of them breakdown into superficial ulcerations.  ECOG PS- 1 Pain scale- 0   Review of systems- Review of Systems  Constitutional:  Negative for chills, fever, malaise/fatigue and weight loss.  HENT:  Negative for congestion, ear discharge and nosebleeds.   Eyes:  Negative for blurred vision.  Respiratory:  Negative for cough, hemoptysis, sputum production, shortness of breath and wheezing.   Cardiovascular:  Negative for chest pain, palpitations, orthopnea and claudication.  Gastrointestinal:  Negative for abdominal pain, blood in stool, constipation, diarrhea, heartburn, melena, nausea and vomiting.  Genitourinary:  Negative for dysuria, flank pain, frequency, hematuria and urgency.  Musculoskeletal:  Negative for back pain, joint pain and myalgias.  Skin:  Positive for rash.  Neurological:  Negative for dizziness, tingling, focal weakness, seizures, weakness and headaches.  Endo/Heme/Allergies:  Does not bruise/bleed easily.  Psychiatric/Behavioral:  Negative for depression and suicidal ideas. The patient does not have insomnia.       Allergies  Allergen Reactions   Sulfa Antibiotics Nausea Only and Nausea And Vomiting     Past Medical History:  Diagnosis Date   B12 deficiency    Back pain    CLL (chronic lymphocytic leukemia) (HCC) 06/21/2016   Rai stage 0, dx 06/2016 (Dr Randy Buttery)   HLD (hyperlipidemia)    Joint pain    Knee problem    Macular degeneration of both eyes    OSA on CPAP 2006,  04/2014   AHI of 23.8 and an RDI of 42.9, on CPAP 9 cm H2O (Dohmeier)   Osteopenia 08/2010   T -1.4 at L femur neck   Piriformis syndrome 12/14/2014   Vitamin D  deficiency       Past Surgical History:  Procedure Laterality Date   ABDOMINAL HYSTERECTOMY  01/27/1983   for menometrorrhagia, one ovary remained   CHOLECYSTECTOMY N/A 08/01/2018   LAPAROSCOPIC CHOLECYSTECTOMY; for gallstone pancreatitis  Leslye Rast, Bridgette Campus, MD)   COLONOSCOPY  2012   no records available. per patient normal.    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, fitness trainer  Tobacco Use   Smoking status: Former    Current packs/day: 0.00    Average packs/day: 2.0 packs/day for 20.0 years (40.0 ttl pk-yrs)    Types: Cigarettes    Start date: 01/26/1958    Quit date: 01/26/1978    Years since quitting: 45.3   Smokeless tobacco: Never  Vaping Use   Vaping status: Never Used  Substance and Sexual Activity   Alcohol use: Yes    Comment: occasionally   Drug use: Never   Sexual activity: Yes  Other Topics Concern   Not on file  Social History Narrative   Lives with daughter, 1 dog   Occupation: retired, has worked in Pharmacologist, also Optometrist   Edu: 1.5 yrs college   Activity: square dancing   Diet: good water, fruits/vegetables daily   Patient drinks two cups of caffeine daily.   Remarried.     Social Drivers of Corporate investment banker Strain: Low Risk  (10/24/2019)   Overall Financial Resource Strain (CARDIA)    Difficulty of Paying Living Expenses: Not hard at all  Food Insecurity: No Food Insecurity (10/24/2019)   Hunger Vital Sign    Worried About Running Out of Food in the Last Year: Never true    Ran Out of Food in the Last Year: Never true  Transportation Needs: No Transportation Needs (10/24/2019)   PRAPARE - Scientist, research (physical sciences) (Medical): No    Lack of Transportation (Non-Medical): No  Physical Activity: Inactive (10/24/2019)   Exercise Vital Sign    Days of Exercise per Week: 0 days    Minutes of Exercise per Session: 0 min  Stress: No Stress Concern Present (10/24/2019)   [employer] of Occupational Health - Occupational Stress Questionnaire    Feeling of Stress : Not at all  Social Connections: Not on file  Intimate Partner Violence: Not At Risk (10/24/2019)   Humiliation, Afraid, Rape, and Kick questionnaire    Fear of Current or Ex-Partner: No    Emotionally Abused: No    Physically Abused: No    Sexually Abused: No    Family History  Problem Relation Age of Onset   Hyperlipidemia Mother    CAD Mother        CABG   Hypertension Mother    Hyperlipidemia Father    CAD Father        MI   Sudden death Father    Cancer Sister  breast   Breast cancer Sister 57   Sleep apnea Brother    Sleep apnea Brother    Sleep apnea Brother    Arthritis Maternal Grandmother    Alzheimer's disease Other    Heart attack Other    Diabetes Neg Hx    Stroke Neg Hx      Current Outpatient Medications:    triamcinolone ointment (KENALOG) 0.5 %, Apply 1 Application topically 2 (two) times daily., Disp: 30 g, Rfl: 3   acalabrutinib  maleate (CALQUENCE ) 100 MG tablet, Take 1 tablet (100 mg total) by mouth 2 (two) times daily., Disp: 60 tablet, Rfl: 1   allopurinol  (ZYLOPRIM ) 300 MG tablet, Take 300 mg by mouth daily., Disp: , Rfl:    aspirin EC 81 MG tablet, Take 81 mg by mouth daily. Swallow whole., Disp: , Rfl:    Cholecalciferol (VITAMIN D ) 125 MCG (5000 UT) CAPS, Take 1 capsule by mouth daily. (Patient not taking: Reported on 09/15/2022), Disp: 30 capsule, Rfl:    Cyanocobalamin  (VITAMIN B-12) 2500 MCG SUBL, Place 1 tablet (2,500 mcg total) under the tongue daily. (Patient not taking: Reported on 09/15/2022), Disp: , Rfl: 0   fluticasone  (FLONASE ) 50 MCG/ACT nasal spray, PLACE 2 SPRAYS INTO  BOTH NOSTRILS DAILY AS NEEDED, Disp: 16 mL, Rfl: 0   ivermectin (STROMECTOL) 3 MG TABS tablet, Take 1 tablet by mouth once as needed. (Patient not taking: Reported on 03/23/2023), Disp: , Rfl:    Multiple Vitamins-Minerals (PRESERVISION AREDS) TABS, Take 2 tablets by mouth daily. (Patient not taking: Reported on 03/23/2023), Disp: , Rfl:   Physical exam:  Vitals:   06/02/23 0831  BP: 128/64  Pulse: 64  Resp: 18  Temp: (!) 96 F (35.6 C)  TempSrc: Tympanic  SpO2: 100%  Weight: 153 lb 12.8 oz (69.8 kg)  Height: 5\' 4"  (1.626 m)   Physical Exam Cardiovascular:     Rate and Rhythm: Normal rate and regular rhythm.     Heart sounds: Normal heart sounds.  Pulmonary:     Effort: Pulmonary effort is normal.     Breath sounds: Normal breath sounds.  Skin:    Comments: Well-defined areas of erythematous maculopapular rash noted over bilateral arms as well as legs.  1 area of superficial ulceration noted.  Some of these have a nodular appearance and others appear flat  Neurological:     Mental Status: She is alert and oriented to person, place, and time.      I have personally reviewed labs listed below:    Latest Ref Rng & Units 05/17/2023    1:56 PM  CMP  Glucose 70 - 99 mg/dL 88   BUN 8 - 23 mg/dL 18   Creatinine 2.95 - 1.00 mg/dL 6.21   Sodium 308 - 657 mmol/L 139   Potassium 3.5 - 5.1 mmol/L 3.7   Chloride 98 - 111 mmol/L 106   CO2 22 - 32 mmol/L 25   Calcium  8.9 - 10.3 mg/dL 8.5   Total Protein 6.5 - 8.1 g/dL 6.4   Total Bilirubin 0.0 - 1.2 mg/dL 0.7   Alkaline Phos 38 - 126 U/L 56   AST 15 - 41 U/L 19   ALT 0 - 44 U/L 15       Latest Ref Rng & Units 05/17/2023    1:55 PM  CBC  WBC 4.0 - 10.5 K/uL 23.4   Hemoglobin 12.0 - 15.0 g/dL 84.6   Hematocrit 96.2 - 46.0 % 43.3   Platelets 150 -  400 K/uL 124      Assessment and plan- Patient is a 78 y.o. female with history of Rai stage IV CLL currently on acalabrutinib  here for acute visit of ongoing skin rash  Suspect skin  rash is secondary to acalabrutinib  which is a known toxicity.  I have asked her to reduce the dose of acalabrutinib  to 100 mg once a day and I will send her a prescription for Kenalog steroid cream that she can apply topically to areas which are most pruritic.  If skin rash does not resolve despite that I will consider giving her a course of oral steroids and holding acalabrutinib  temporarily.  I will see her back in 2 months as planned and see how her skin rash is doing.  If skin rash is resolved we could consider rechallenging her back to her 100 mg twice daily dosing of acalabrutinib .  Overall in terms of CLL she seems to be responding well as evidenced by reducing lymphocytosis and improving platelet counts.  Patient comprehends my plan well   Visit Diagnosis 1. Drug-induced skin rash   2. High risk medication use   3. CLL (chronic lymphocytic leukemia) (HCC)      Dr. Seretha Dance, MD, MPH [facility] at [facility] [phone] 06/02/2023 8:50 AM

## 2023-06-02 NOTE — Progress Notes (Signed)
 Specialty Pharmacy Refill Coordination Note  Lori  Geisha Shaw is a 78 y.o. female contacted today regarding refills of specialty medication(s) Acalabrutinib  Maleate (Calquence )   Patient requested (Patient-Rptd) Delivery   Delivery date: (Patient-Rptd) 06/07/23   Verified address: (Patient-Rptd) [address], [place] [address]   Medication will be filled on 05.09.25.

## 2023-06-03 ENCOUNTER — Other Ambulatory Visit: Payer: Self-pay | Admitting: Pharmacist

## 2023-06-03 ENCOUNTER — Other Ambulatory Visit: Payer: Self-pay

## 2023-06-03 ENCOUNTER — Other Ambulatory Visit (HOSPITAL_COMMUNITY): Payer: Self-pay

## 2023-06-03 DIAGNOSIS — C9110 Chronic lymphocytic leukemia of B-cell type not having achieved remission: Secondary | ICD-10-CM

## 2023-06-03 MED ORDER — CALQUENCE 100 MG PO TABS
100.0000 mg | ORAL_TABLET | Freq: Every day | ORAL | 1 refills | Status: DC
  Filled 2023-06-03: qty 30, 30d supply, fill #0

## 2023-06-03 NOTE — Progress Notes (Signed)
 MD dose reduced patient to 100mg  daily due to rash, updated rx sent to the pharmacy. MD may rechallenge back to 100mg  twice daily in the future.

## 2023-06-04 ENCOUNTER — Other Ambulatory Visit: Payer: Self-pay

## 2023-06-14 ENCOUNTER — Other Ambulatory Visit: Payer: Self-pay

## 2023-06-16 ENCOUNTER — Encounter: Payer: Self-pay | Admitting: Oncology

## 2023-06-16 ENCOUNTER — Other Ambulatory Visit (HOSPITAL_COMMUNITY): Payer: Self-pay

## 2023-06-16 ENCOUNTER — Telehealth: Payer: Self-pay | Admitting: Pharmacist

## 2023-06-16 ENCOUNTER — Inpatient Hospital Stay (HOSPITAL_BASED_OUTPATIENT_CLINIC_OR_DEPARTMENT_OTHER): Admitting: Hospice and Palliative Medicine

## 2023-06-16 ENCOUNTER — Inpatient Hospital Stay

## 2023-06-16 ENCOUNTER — Telehealth: Payer: Self-pay | Admitting: Pharmacy Technician

## 2023-06-16 VITALS — BP 162/73 | HR 66 | Temp 96.7°F | Resp 16 | Wt 155.9 lb

## 2023-06-16 DIAGNOSIS — D751 Secondary polycythemia: Secondary | ICD-10-CM

## 2023-06-16 DIAGNOSIS — Z79899 Other long term (current) drug therapy: Secondary | ICD-10-CM | POA: Diagnosis not present

## 2023-06-16 DIAGNOSIS — E538 Deficiency of other specified B group vitamins: Secondary | ICD-10-CM | POA: Diagnosis not present

## 2023-06-16 DIAGNOSIS — C9110 Chronic lymphocytic leukemia of B-cell type not having achieved remission: Secondary | ICD-10-CM

## 2023-06-16 DIAGNOSIS — L270 Generalized skin eruption due to drugs and medicaments taken internally: Secondary | ICD-10-CM

## 2023-06-16 DIAGNOSIS — R21 Rash and other nonspecific skin eruption: Secondary | ICD-10-CM | POA: Diagnosis not present

## 2023-06-16 LAB — CBC WITH DIFFERENTIAL (CANCER CENTER ONLY)
Abs Immature Granulocytes: 0.06 10*3/uL (ref 0.00–0.07)
Basophils Absolute: 0.2 10*3/uL — ABNORMAL HIGH (ref 0.0–0.1)
Basophils Relative: 1 %
Eosinophils Absolute: 0.3 10*3/uL (ref 0.0–0.5)
Eosinophils Relative: 2 %
HCT: 42.6 % (ref 36.0–46.0)
Hemoglobin: 14.1 g/dL (ref 12.0–15.0)
Immature Granulocytes: 0 %
Lymphocytes Relative: 74 %
Lymphs Abs: 14.1 10*3/uL — ABNORMAL HIGH (ref 0.7–4.0)
MCH: 32.4 pg (ref 26.0–34.0)
MCHC: 33.1 g/dL (ref 30.0–36.0)
MCV: 97.9 fL (ref 80.0–100.0)
Monocytes Absolute: 0.7 10*3/uL (ref 0.1–1.0)
Monocytes Relative: 4 %
Neutro Abs: 3.5 10*3/uL (ref 1.7–7.7)
Neutrophils Relative %: 19 %
Platelet Count: 147 10*3/uL — ABNORMAL LOW (ref 150–400)
RBC: 4.35 MIL/uL (ref 3.87–5.11)
RDW: 13.3 % (ref 11.5–15.5)
WBC Count: 18.8 10*3/uL — ABNORMAL HIGH (ref 4.0–10.5)
nRBC: 0 % (ref 0.0–0.2)

## 2023-06-16 LAB — CMP (CANCER CENTER ONLY)
ALT: 13 U/L (ref 0–44)
AST: 16 U/L (ref 15–41)
Albumin: 4.1 g/dL (ref 3.5–5.0)
Alkaline Phosphatase: 56 U/L (ref 38–126)
Anion gap: 6 (ref 5–15)
BUN: 13 mg/dL (ref 8–23)
CO2: 27 mmol/L (ref 22–32)
Calcium: 8.5 mg/dL — ABNORMAL LOW (ref 8.9–10.3)
Chloride: 107 mmol/L (ref 98–111)
Creatinine: 0.62 mg/dL (ref 0.44–1.00)
GFR, Estimated: >60 mL/min (ref >60)
Glucose, Bld: 90 mg/dL (ref 70–99)
Potassium: 4 mmol/L (ref 3.5–5.1)
Sodium: 140 mmol/L (ref 135–145)
Total Bilirubin: 1.2 mg/dL (ref 0.0–1.2)
Total Protein: 6.6 g/dL (ref 6.5–8.1)

## 2023-06-16 MED ORDER — CYANOCOBALAMIN 1000 MCG/ML IJ SOLN
1000.0000 ug | INTRAMUSCULAR | Status: DC
  Administered 2023-06-16: 1000 ug via INTRAMUSCULAR
  Filled 2023-06-16: qty 1

## 2023-06-16 NOTE — Progress Notes (Signed)
 Symptom Management Clinic [facility] at [facility] [phone] Fax:([phone]  Patient Care Team: Claire Crick, MD as PCP - General (Family Medicine) Zaldivar, Renzo A, MD as Attending Physician (Ophthalmology) Alton Au, DDS as Consulting Physician (Dentistry) Avonne Boettcher, MD as Consulting Physician (Hematology and Oncology)   NAME OF PATIENT: Lori  Shaw  [id number]  1945-12-12   DATE OF VISIT: 06/16/23  REASON FOR CONSULT: Krisa  Armine Rizzolo is a 78 y.o. female with multiple medical problems including CLL.   INTERVAL HISTORY: Patient saw Dr. Randy Buttery on 06/02/2023.  Patient was noted to have a pruritic rash over her lower extremities.  There were areas of superficial ulceration.  Rash was suspected to be secondary to acalabrutinib  and dose was subsequently reduced.  Patient was started on topical steroids.  Patient presents to [facility] today with recurrent rash.  Patient states that her previous rash went away after dose reduction of Calquence .  However, 3 days ago, she noticed a blister on her right leg.  She lanced it with a needle.  She now has a well-defined circular area of erythema that is intensely pruritic.  Patient states that this is same appearance as her previous rashes on Calquence .  Denies any neurologic complaints. Denies recent fevers or illnesses. Denies any easy bleeding or bruising. Reports good appetite and denies weight loss. Denies chest pain. Denies any nausea, vomiting, constipation, or diarrhea. Denies urinary complaints. Patient offers no further specific complaints today.  PAST MEDICAL HISTORY: Past Medical History:  Diagnosis Date   B12 deficiency    Back pain    CLL (chronic lymphocytic leukemia) (HCC) 06/21/2016   Rai stage 0, dx 06/2016 (Dr Randy Buttery)   HLD (hyperlipidemia)    Joint pain    Knee problem    Macular degeneration of both eyes    OSA on CPAP 2006, 04/2014   AHI of 23.8 and an RDI of 42.9,  on CPAP 9 cm H2O (Dohmeier)   Osteopenia 08/2010   T -1.4 at L femur neck   Piriformis syndrome 12/14/2014   Vitamin D  deficiency     PAST SURGICAL HISTORY:  Past Surgical History:  Procedure Laterality Date   ABDOMINAL HYSTERECTOMY  01/27/1983   for menometrorrhagia, one ovary remained   CHOLECYSTECTOMY N/A 08/01/2018   LAPAROSCOPIC CHOLECYSTECTOMY; for gallstone pancreatitis  Leslye Rast, Bridgette Campus, MD)   COLONOSCOPY  2012   no records available. per patient normal.    dexa  08/2010   osteopenia   IR BONE MARROW BIOPSY & ASPIRATION  08/26/2022   TONSILLECTOMY  01/26/1973    HEMATOLOGY/ONCOLOGY HISTORY:  Oncology History  CLL (chronic lymphocytic leukemia) (HCC)  06/21/2016 Initial Diagnosis   CLL (chronic lymphocytic leukemia) (HCC)   03/09/2023 Cancer Staging   Staging form: Chronic Lymphocytic Leukemia / Small Lymphocytic Lymphoma, AJCC 8th Edition - Clinical stage from 03/09/2023: Modified Rai Stage IV (Modified Rai risk: High, Binet: Stage B, Lymphocytosis: Present, Adenopathy: Present, Organomegaly: Absent, Anemia: Absent, Thrombocytopenia: Present) - Signed by Avonne Boettcher, MD on 03/09/2023 Stage prefix: Initial diagnosis     ALLERGIES:  is allergic to sulfa antibiotics.  MEDICATIONS:  Current Outpatient Medications  Medication Sig Dispense Refill   acalabrutinib  maleate (CALQUENCE ) 100 MG tablet Take 1 tablet (100 mg total) by mouth daily. 30 tablet 1   allopurinol  (ZYLOPRIM ) 300 MG tablet Take 300 mg by mouth daily.     aspirin EC 81 MG tablet Take 81 mg by mouth daily. Swallow whole.     fluticasone  (  FLONASE ) 50 MCG/ACT nasal spray PLACE 2 SPRAYS INTO BOTH NOSTRILS DAILY AS NEEDED 16 mL 0   triamcinolone  ointment (KENALOG ) 0.5 % Apply 1 Application topically 2 (two) times daily. 30 g 3   Cholecalciferol (VITAMIN D ) 125 MCG (5000 UT) CAPS Take 1 capsule by mouth daily. (Patient not taking: Reported on 06/16/2023) 30 capsule    Cyanocobalamin  (VITAMIN B-12) 2500 MCG SUBL  Place 1 tablet (2,500 mcg total) under the tongue daily. (Patient not taking: Reported on 09/15/2022)  0   ivermectin (STROMECTOL) 3 MG TABS tablet Take 1 tablet by mouth once as needed. (Patient not taking: Reported on 06/16/2023)     Multiple Vitamins-Minerals (PRESERVISION AREDS) TABS Take 2 tablets by mouth daily. (Patient not taking: Reported on 03/09/2023)     No current facility-administered medications for this visit.    VITAL SIGNS: BP (!) 162/73   Pulse 66   Temp (!) 96.7 F (35.9 C)   Resp 16   Wt 155 lb 14.4 oz (70.7 kg)   BMI 26.76 kg/m  Filed Weights   06/16/23 1343  Weight: 155 lb 14.4 oz (70.7 kg)    Estimated body mass index is 26.76 kg/m as calculated from the following:   Height as of 06/02/23: 5\' 4"  (1.626 m).   Weight as of this encounter: 155 lb 14.4 oz (70.7 kg).  LABS: CBC:    Component Value Date/Time   WBC 18.8 (H) 06/16/2023 1252   WBC 104.3 (HH) 03/09/2023 1329   HGB 14.1 06/16/2023 1252   HGB 14.4 02/02/2020 1448   HGB 14.5 04/10/2010 0000   HCT 42.6 06/16/2023 1252   HCT 43.3 02/02/2020 1448   PLT 147 (L) 06/16/2023 1252   PLT 168 02/02/2020 1448   MCV 97.9 06/16/2023 1252   MCV 91 02/02/2020 1448   NEUTROABS 3.5 06/16/2023 1252   NEUTROABS 3.3 11/25/2017 1208   LYMPHSABS 14.1 (H) 06/16/2023 1252   LYMPHSABS 8.7 (H) 11/25/2017 1208   MONOABS 0.7 06/16/2023 1252   EOSABS 0.3 06/16/2023 1252   EOSABS 0.3 11/25/2017 1208   BASOSABS 0.2 (H) 06/16/2023 1252   BASOSABS 0.1 11/25/2017 1208   Comprehensive Metabolic Panel:    Component Value Date/Time   NA 140 06/16/2023 1252   NA 142 02/02/2020 1448   K 4.0 06/16/2023 1252   CL 107 06/16/2023 1252   CO2 27 06/16/2023 1252   BUN 13 06/16/2023 1252   BUN 11 02/02/2020 1448   CREATININE 0.62 06/16/2023 1252   GLUCOSE 90 06/16/2023 1252   CALCIUM  8.5 (L) 06/16/2023 1252   AST 16 06/16/2023 1252   ALT 13 06/16/2023 1252   ALKPHOS 56 06/16/2023 1252   ALKPHOS 79 04/10/2010 0000   BILITOT  1.2 06/16/2023 1252   PROT 6.6 06/16/2023 1252   PROT 6.2 02/02/2020 1448   ALBUMIN 4.1 06/16/2023 1252   ALBUMIN 4.6 02/02/2020 1448    RADIOGRAPHIC STUDIES: No results found.  PERFORMANCE STATUS (ECOG) : 1 - Symptomatic but completely ambulatory  Review of Systems Unless otherwise noted, a complete review of systems is negative.  Physical Exam General: NAD Cardiovascular: regular rate and rhythm Pulmonary: clear ant fields Abdomen: soft, nontender, + bowel sounds GU: no suprapubic tenderness Extremities: no edema, no joint deformities Skin: Circumferential area of erythema on right leg Neurological: nonfocal    IMPRESSION/PLAN: CLL -on Calquence .  Patient has recurrent rashes.  These were previously improved with dose reduction of Calquence .  Discussed with Dr. Randy Buttery and patient will hold Calquence  and  rotate to a different B TK inhibitor.    Rash -patient describes same appearance as her previous drug-induced rash.  Will hold on systemic steroids or antibiotics at this point.  Patient has Kenalog  cream to use at home.  Also may try oral antihistamine.  Patient to inform us  if no improvement after holding Calquence .  Could consider referral to dermatology if needed.  Case and plan discussed with Dr. Randy Buttery.  Patient to have follow-up with Alyson Leonard, Pharm.D.    Patient expressed understanding and was in agreement with this plan. She also understands that She can call clinic at any time with any questions, concerns, or complaints.   Thank you for allowing me to participate in the care of this very pleasant patient.   Time Total: 15 minutes  Visit consisted of counseling and education dealing with the complex and emotionally intense issues of symptom management in the setting of serious illness.Greater than 50%  of this time was spent counseling and coordinating care related to the above assessment and plan.  Signed by: Gerilyn Kobus, PhD, NP-C

## 2023-06-16 NOTE — Telephone Encounter (Signed)
 Oral Oncology Patient Advocate Encounter   Received notification that prior authorization for Brukinsa is required.   PA submitted on 06/16/2023 Key [id number] Status is pending     Lori Shaw, CPhT Oncology Pharmacy Patient Advocate [facility] Direct Number: [phone] Fax: [phone]

## 2023-06-16 NOTE — Telephone Encounter (Signed)
 Patient has been added on to [facility]

## 2023-06-16 NOTE — Telephone Encounter (Signed)
.  Oral Oncology Patient Advocate Encounter  Prior Authorization for Brukinsa has been approved.    PA# [id number] Effective dates: 01/27/2023 through 01/26/2024  Patients co-pay is $0.    Thresa Floor, CPhT Oncology Pharmacy Patient Advocate [facility] Direct Number: [phone] Fax: [phone]

## 2023-06-16 NOTE — Progress Notes (Signed)
 Patient concerned with returning rash after decrease of Calquence  dosing.

## 2023-06-16 NOTE — Telephone Encounter (Signed)
 Clinical Pharmacist Practitioner Encounter   Received new prescription for Brikinsa (zanubrutinib) for the treatment of CLL, planned duration until disease progression or unacceptable drug toxicity.   Patient was having a rash on acalabrutinib  that did not improve with dose reduction. She will now be transitioned to zanubrutinib to see if that is better tolerated.   CMP from 06/17/23 assessed, no relevant lab abnormalities. Prescription dose and frequency assessed.   Current medication list in Epic reviewed, no relevant DDIs with zanubrutinib identified.   Evaluated chart and no patient barriers to medication adherence identified.   Prescription has been e-scribed to the [facility] for benefits analysis and approval.  Oral Oncology Clinic will continue to follow for insurance authorization, copayment issues, initial counseling and start date.   Tamir Wallman N. Yariel Ferraris, PharmD, BCOP, CPP Hematology/Oncology Clinical Pharmacist [facility] Oral Chemotherapy Navigation Clinic [phone]  06/16/2023 4:14 PM

## 2023-06-17 MED ORDER — ZANUBRUTINIB 80 MG PO CAPS
160.0000 mg | ORAL_CAPSULE | Freq: Two times a day (BID) | ORAL | 1 refills | Status: DC
  Filled 2023-06-18: qty 120, 30d supply, fill #0
  Filled 2023-07-16 – 2023-07-19 (×2): qty 120, 30d supply, fill #1

## 2023-06-18 ENCOUNTER — Other Ambulatory Visit: Payer: Self-pay

## 2023-06-18 NOTE — Progress Notes (Signed)
 Specialty Pharmacy Initial Fill Coordination Note  Lori  Damaris Shaw is a 78 y.o. female contacted today regarding initial fill of specialty medication(s) Zanubrutinib (BRUKINSA)  Patient requested Delivery   Delivery date: 06/23/23   Verified address: [address], [place], [place] [address]  Medication will be filled on 06/22/23.   Patient is aware of $0.00 copayment.    Hansel Ley, CPhT Pharmacy Technician Coordinator [facility] [phone] (Ph) 06/18/2023 10:08 AM

## 2023-06-18 NOTE — Progress Notes (Signed)
 Patient education documented in EPIC note on 06/18/23.

## 2023-06-18 NOTE — Telephone Encounter (Signed)
 Clinical Pharmacist Practitioner Encounter   Patient Education I spoke with patient for overview of new oral chemotherapy medication: Brikinsa (zanubrutinib) for the treatment of CLL, planned duration until disease progression or unacceptable drug toxicity.   Counseled patient on administration, dosing, side effects, monitoring, drug-food interactions, safe handling, storage, and disposal. Patient will take 2 capsules (160 mg total) by mouth 2 (two) times daily.   Side effects include but not limited to: fatigue, decreased wbc/hgb/plt, rash, muscle cramps.   Rash: patient knows to call the office is a rash occurs  Reviewed with patient importance of keeping a medication schedule and plan for any missed doses.  After discussion with patient no patient barriers to medication adherence identified.   Ms. Yanes voiced understanding and appreciation. All questions answered. Medication handout provided.  Provided patient with Oral Chemotherapy Navigation Clinic phone number. Patient knows to call the office with questions or concerns. Oral Chemotherapy Navigation Clinic will continue to follow.  Francile Woolford N. Labrenda Lasky, PharmD, BCOP, CPP Hematology/Oncology Clinical Pharmacist [facility] Oral Chemotherapy Navigation Clinic [phone]  06/18/2023 9:57 AM

## 2023-07-13 ENCOUNTER — Other Ambulatory Visit: Payer: Self-pay

## 2023-07-13 NOTE — Progress Notes (Signed)
 Specialty Pharmacy Ongoing Clinical Assessment Note  Mathea  Burlene Montecalvo is a 78 y.o. female who is being followed by the specialty pharmacy service for RxSp Oncology   Patient's specialty medication(s) reviewed today: Zanubrutinib  (BRUKINSA )   Missed doses in the last 4 weeks: 0   Patient/Caregiver did not have any additional questions or concerns.   Therapeutic benefit summary: Unable to assess   Adverse events/side effects summary: Experienced adverse events/side effects (rash, not as bad as what occured with Calquence . Has f/u tomorrow with Randy Buttery)   Patient's therapy is appropriate to: Continue    Goals Addressed             This Visit's Progress    Achieve or maintain remission   No change    Patient is unable to be assessed as therapy was recently initiated. Patient will maintain adherence and be monitored by provider to determine if a change in treatment plan is warranted         Follow up: 3 months  [facility] Specialty Pharmacist

## 2023-07-14 ENCOUNTER — Inpatient Hospital Stay: Attending: Oncology

## 2023-07-14 ENCOUNTER — Encounter: Payer: Self-pay | Admitting: Oncology

## 2023-07-14 ENCOUNTER — Inpatient Hospital Stay

## 2023-07-14 ENCOUNTER — Inpatient Hospital Stay: Admitting: Oncology

## 2023-07-14 VITALS — BP 128/62 | HR 63 | Temp 97.8°F | Resp 18 | Ht 64.0 in | Wt 158.0 lb

## 2023-07-14 DIAGNOSIS — Z79899 Other long term (current) drug therapy: Secondary | ICD-10-CM | POA: Diagnosis not present

## 2023-07-14 DIAGNOSIS — E538 Deficiency of other specified B group vitamins: Secondary | ICD-10-CM | POA: Diagnosis not present

## 2023-07-14 DIAGNOSIS — C9110 Chronic lymphocytic leukemia of B-cell type not having achieved remission: Secondary | ICD-10-CM

## 2023-07-14 DIAGNOSIS — D751 Secondary polycythemia: Secondary | ICD-10-CM

## 2023-07-14 LAB — CBC WITH DIFFERENTIAL (CANCER CENTER ONLY)
Abs Immature Granulocytes: 0.07 10*3/uL (ref 0.00–0.07)
Basophils Absolute: 0.2 10*3/uL — ABNORMAL HIGH (ref 0.0–0.1)
Basophils Relative: 1 %
Eosinophils Absolute: 0.2 10*3/uL (ref 0.0–0.5)
Eosinophils Relative: 1 %
HCT: 43.5 % (ref 36.0–46.0)
Hemoglobin: 14.3 g/dL (ref 12.0–15.0)
Immature Granulocytes: 0 %
Lymphocytes Relative: 83 %
Lymphs Abs: 21.4 10*3/uL — ABNORMAL HIGH (ref 0.7–4.0)
MCH: 32.8 pg (ref 26.0–34.0)
MCHC: 32.9 g/dL (ref 30.0–36.0)
MCV: 99.8 fL (ref 80.0–100.0)
Monocytes Absolute: 0.7 10*3/uL (ref 0.1–1.0)
Monocytes Relative: 3 %
Neutro Abs: 3.2 10*3/uL (ref 1.7–7.7)
Neutrophils Relative %: 12 %
Platelet Count: 118 10*3/uL — ABNORMAL LOW (ref 150–400)
RBC: 4.36 MIL/uL (ref 3.87–5.11)
RDW: 13.2 % (ref 11.5–15.5)
WBC Count: 25.8 10*3/uL — ABNORMAL HIGH (ref 4.0–10.5)
nRBC: 0 % (ref 0.0–0.2)

## 2023-07-14 LAB — CMP (CANCER CENTER ONLY)
ALT: 17 U/L (ref 0–44)
AST: 21 U/L (ref 15–41)
Albumin: 4 g/dL (ref 3.5–5.0)
Alkaline Phosphatase: 49 U/L (ref 38–126)
Anion gap: 8 (ref 5–15)
BUN: 17 mg/dL (ref 8–23)
CO2: 26 mmol/L (ref 22–32)
Calcium: 8.7 mg/dL — ABNORMAL LOW (ref 8.9–10.3)
Chloride: 106 mmol/L (ref 98–111)
Creatinine: 0.58 mg/dL (ref 0.44–1.00)
GFR, Estimated: >60 mL/min (ref >60)
Glucose, Bld: 87 mg/dL (ref 70–99)
Potassium: 4.5 mmol/L (ref 3.5–5.1)
Sodium: 140 mmol/L (ref 135–145)
Total Bilirubin: 1 mg/dL (ref 0.0–1.2)
Total Protein: 6.5 g/dL (ref 6.5–8.1)

## 2023-07-14 MED ORDER — HYDROXYZINE HCL 10 MG PO TABS: 10.0000 mg | ORAL_TABLET | Freq: Three times a day (TID) | ORAL | 3 refills | Status: DC | PRN

## 2023-07-14 MED ORDER — CYANOCOBALAMIN 1000 MCG/ML IJ SOLN
1000.0000 ug | INTRAMUSCULAR | Status: DC
  Administered 2023-07-14: 1000 ug via INTRAMUSCULAR
  Filled 2023-07-14: qty 1

## 2023-07-14 NOTE — Progress Notes (Signed)
 Hematology/Oncology Consult note [facility], [place]  [phone] [phone]  Patient Care Team: Claire Crick, MD as PCP - General (Family Medicine) Zaldivar, Renzo A, MD as Attending Physician (Ophthalmology) Alton Au, DDS as Consulting Physician (Dentistry) Avonne Boettcher, MD as Consulting Physician (Hematology and Oncology)   Name of the patient: Lori Shaw  [id number]  03/05/1945   Date of visit: 07/14/23  Diagnosis-Rai stage IV CLL  Chief complaint/ Reason for visit-routine follow-up of CLL on Zanubrutinib   Heme/Onc history: patient is a 78 year old female with no significant past medical history other than low B12 and low vitamin D  levels. Patient was recently noted to have leukocytosis with a white count of 13. Differential mainly showed lymphocytosis. Recent CBC from 07/03/2016 showed white count of 13, H&H of 14.7/44 and a platelet count of 207. Differential showed 63.5% lymphocytes. Peripheral flow cytometry showed CD5 positive, CD23 positive clonal B-cell population CLL/SLL phenotype.  Patient has not required any treatment for CLL so far    There has been a progressiveReason leukocytosis/lymphocytosis since June 2023.  Absolute lymphocyte doubling time remains about 7 to 8 months.  Also patient noted to have progressive thrombocytopenia since December 2023.  No B symptoms. Patient has mutated IgH V.  FISH for CLL showed 38.5% of nuclei positive for T p53. Patient also noted to have polycythemia with hemoglobin between 17-18 since June 2024.  JAK2, CALR, MPL mutation negative.  EPO levels normal.  Patient is a non-smoker.  No overt evidence of myeloproliferative neoplasm noted on bone marrow biopsy.62% bone marrow involvement with CLL noted in July 2024.   Patient had progressive thrombocytopenia, worsening lymphocytosis and B symptoms. Mild axillary, retroperitoneal and iliac station adenopathy. No splenomegaly. Patient was  started on acalibrutinib in feb 2025.  Patient developed macular erythematous patches of rash all over her body which have continued despite dose reducing acalabrutinib  and therefore patient was switched to Zanubrutinib     Interval history-patient still gets on and off diffuse body rash which comes and red patches mainly over her extremities but sometimes over her face as well.  These are intensely pruritic.  She has tried oral Benadryl as needed as well as Kenalog  cream with no significant improvement.  ECOG PS- 1 Pain scale- 0   Review of systems- Review of Systems  Constitutional:  Positive for malaise/fatigue. Negative for chills, fever and weight loss.  HENT:  Negative for congestion, ear discharge and nosebleeds.   Eyes:  Negative for blurred vision.  Respiratory:  Negative for cough, hemoptysis, sputum production, shortness of breath and wheezing.   Cardiovascular:  Negative for chest pain, palpitations, orthopnea and claudication.  Gastrointestinal:  Negative for abdominal pain, blood in stool, constipation, diarrhea, heartburn, melena, nausea and vomiting.  Genitourinary:  Negative for dysuria, flank pain, frequency, hematuria and urgency.  Musculoskeletal:  Negative for back pain, joint pain and myalgias.  Skin:  Positive for rash.  Neurological:  Negative for dizziness, tingling, focal weakness, seizures, weakness and headaches.  Endo/Heme/Allergies:  Does not bruise/bleed easily.  Psychiatric/Behavioral:  Negative for depression and suicidal ideas. The patient does not have insomnia.       Allergies  Allergen Reactions   Sulfa Antibiotics Nausea Only and Nausea And Vomiting     Past Medical History:  Diagnosis Date   B12 deficiency    Back pain    CLL (chronic lymphocytic leukemia) (HCC) 06/21/2016   Rai stage 0, dx 06/2016 (Dr Randy Buttery)   HLD (hyperlipidemia)  Joint pain    Knee problem    Macular degeneration of both eyes    OSA on CPAP 2006, 04/2014   AHI of 23.8  and an RDI of 42.9, on CPAP 9 cm H2O (Dohmeier)   Osteopenia 08/2010   T -1.4 at L femur neck   Piriformis syndrome 12/14/2014   Vitamin D  deficiency      Past Surgical History:  Procedure Laterality Date   ABDOMINAL HYSTERECTOMY  01/27/1983   for menometrorrhagia, one ovary remained   CHOLECYSTECTOMY N/A 08/01/2018   LAPAROSCOPIC CHOLECYSTECTOMY; for gallstone pancreatitis  Leslye Rast, Bridgette Campus, MD)   COLONOSCOPY  2012   no records available. per patient normal.    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, fitness trainer  Tobacco Use   Smoking status: Former    Current packs/day: 0.00    Average packs/day: 2.0 packs/day for 20.0 years (40.0 ttl pk-yrs)    Types: Cigarettes    Start date: 01/26/1958    Quit date: 01/26/1978    Years since quitting: 45.4   Smokeless tobacco: Never  Vaping Use   Vaping status: Never Used  Substance and Sexual Activity   Alcohol use: Yes    Comment: occasionally   Drug use: Never   Sexual activity: Yes  Other Topics Concern   Not on file  Social History Narrative   Lives with daughter, 1 dog   Occupation: retired, has worked in Pharmacologist, also Optometrist   Edu: 1.5 yrs college   Activity: square dancing   Diet: good water, fruits/vegetables daily   Patient drinks two cups of caffeine daily.   Remarried.     Social Drivers of Corporate investment banker Strain: Low Risk  (10/24/2019)   Overall Financial Resource Strain (CARDIA)    Difficulty of Paying Living Expenses: Not hard at all  Food Insecurity: No Food Insecurity (10/24/2019)   Hunger Vital Sign    Worried About Running Out of Food in the Last Year: Never  true    Ran Out of Food in the Last Year: Never true  Transportation Needs: No Transportation Needs (10/24/2019)   PRAPARE - Administrator, Civil Service (Medical): No    Lack of Transportation (Non-Medical): No  Physical Activity: Inactive (10/24/2019)   Exercise Vital Sign    Days of Exercise per Week: 0 days    Minutes of Exercise per Session: 0 min  Stress: No Stress Concern Present (10/24/2019)   [employer] of Occupational Health - Occupational Stress Questionnaire    Feeling of Stress : Not at all  Social Connections: Not on file  Intimate Partner Violence: Not At Risk (10/24/2019)   Humiliation, Afraid, Rape, and Kick questionnaire    Fear of Current or Ex-Partner: No    Emotionally Abused: No    Physically Abused: No    Sexually Abused: No    Family History  Problem Relation Age of Onset   Hyperlipidemia Mother    CAD Mother        CABG   Hypertension Mother    Hyperlipidemia Father    CAD Father  MI   Sudden death Father    Cancer Sister        breast   Breast cancer Sister 22   Sleep apnea Brother    Sleep apnea Brother    Sleep apnea Brother    Arthritis Maternal Grandmother    Alzheimer's disease Other    Heart attack Other    Diabetes Neg Hx    Stroke Neg Hx      Current Outpatient Medications:    hydrOXYzine (ATARAX) 10 MG tablet, Take 1 tablet (10 mg total) by mouth 3 (three) times daily as needed., Disp: 60 tablet, Rfl: 3   zanubrutinib  (BRUKINSA ) 80 MG capsule, Take 2 capsules (160 mg total) by mouth 2 (two) times daily., Disp: 120 capsule, Rfl: 1   allopurinol  (ZYLOPRIM ) 300 MG tablet, Take 300 mg by mouth daily. (Patient not taking: Reported on 07/14/2023), Disp: , Rfl:    aspirin EC 81 MG tablet, Take 81 mg by mouth daily. Swallow whole. (Patient not taking: Reported on 07/14/2023), Disp: , Rfl:    Cholecalciferol (VITAMIN D ) 125 MCG (5000 UT) CAPS, Take 1 capsule by mouth daily. (Patient not taking: Reported on 07/14/2023),  Disp: 30 capsule, Rfl:    Cyanocobalamin  (VITAMIN B-12) 2500 MCG SUBL, Place 1 tablet (2,500 mcg total) under the tongue daily. (Patient not taking: Reported on 07/14/2023), Disp: , Rfl: 0   fluticasone  (FLONASE ) 50 MCG/ACT nasal spray, PLACE 2 SPRAYS INTO BOTH NOSTRILS DAILY AS NEEDED (Patient not taking: Reported on 07/14/2023), Disp: 16 mL, Rfl: 0   ivermectin (STROMECTOL) 3 MG TABS tablet, Take 1 tablet by mouth once as needed. (Patient not taking: Reported on 07/14/2023), Disp: , Rfl:    Multiple Vitamins-Minerals (PRESERVISION AREDS) TABS, Take 2 tablets by mouth daily. (Patient not taking: Reported on 07/14/2023), Disp: , Rfl:    triamcinolone  ointment (KENALOG ) 0.5 %, Apply 1 Application topically 2 (two) times daily. (Patient not taking: Reported on 07/14/2023), Disp: 30 g, Rfl: 3 No current facility-administered medications for this visit.  Facility-Administered Medications Ordered in Other Visits:    cyanocobalamin  (VITAMIN B12) injection 1,000 mcg, 1,000 mcg, Intramuscular, Q30 days, Avonne Boettcher, MD, 1,000 mcg at 07/14/23 1434  Physical exam:  Vitals:   07/14/23 1355  BP: 128/62  Pulse: 63  Resp: 18  Temp: 97.8 F (36.6 C)  TempSrc: Tympanic  SpO2: 99%  Weight: 158 lb (71.7 kg)  Height: 5' 4 (1.626 m)   Physical Exam  Cardiovascular:     Rate and Rhythm: Normal rate and regular rhythm.     Heart sounds: Normal heart sounds.  Pulmonary:     Effort: Pulmonary effort is normal.     Breath sounds: Normal breath sounds.  Abdominal:     General: Bowel sounds are normal.   Skin:    General: Skin is warm and dry.     Comments: Patchy area is of macular erythematous rash noted over bilateral lower extremities   Neurological:     Mental Status: She is alert and oriented to person, place, and time.      I have personally reviewed labs listed below:    Latest Ref Rng & Units 07/14/2023    1:29 PM  CMP  Glucose 70 - 99 mg/dL 87   BUN 8 - 23 mg/dL 17   Creatinine 4.09 -  1.00 mg/dL 8.11   Sodium 914 - 782 mmol/L 140   Potassium 3.5 - 5.1 mmol/L 4.5   Chloride 98 - 111 mmol/L 106  CO2 22 - 32 mmol/L 26   Calcium  8.9 - 10.3 mg/dL 8.7   Total Protein 6.5 - 8.1 g/dL 6.5   Total Bilirubin 0.0 - 1.2 mg/dL 1.0   Alkaline Phos 38 - 126 U/L 49   AST 15 - 41 U/L 21   ALT 0 - 44 U/L 17       Latest Ref Rng & Units 07/14/2023    1:29 PM  CBC  WBC 4.0 - 10.5 K/uL 25.8   Hemoglobin 12.0 - 15.0 g/dL 16.1   Hematocrit 09.6 - 46.0 % 43.5   Platelets 150 - 400 K/uL 118      Assessment and plan- Patient is a 78 y.o. female with history of Rai stage IV CLL currently on Zanubrutinib  here for routine follow-up  Despite dose reducing acalabrutinib  and switching to Zanubrutinib  patient has continued to have patchy erythematous rash mainly over her extremities but also sometimes over her face.  I have asked her to take over-the-counter Zyrtec on a standing basis and I am also standing her as needed hydroxyzine.  She will also continue to use topical emollients as well as topical Kenalog  cream.  If rash persists or worsens I will consider starting her on a low-dose prednisone  for a month or 2 to see if we can get over the rash and maybe eventually she will not have repeated episodes of rash on Zanubrutinib  which would be an important drug for her.  Her labs show mildly worsened lymphocytosis and thrombocytopenia but overall stable and patient will continue to remain on Zanubrutinib  at this time.  I will see her back in 2 months with labs   Visit Diagnosis 1. CLL (chronic lymphocytic leukemia) (HCC)   2. High risk medication use      Dr. Seretha Dance, MD, MPH [facility] [phone] 07/14/2023 4:05 PM

## 2023-07-14 NOTE — Progress Notes (Signed)
 Patient is having spots that are red and itchy. Wants to talk to Doctor about if she could get the B 12 injections on the same days that she comes for labs, or sees the doctor.

## 2023-07-15 ENCOUNTER — Other Ambulatory Visit: Payer: Self-pay

## 2023-07-15 ENCOUNTER — Telehealth: Payer: Self-pay

## 2023-07-15 NOTE — Telephone Encounter (Signed)
 Received approval for PA for Atarax.  Spoke with representative in pharmacy who indicated the order is ready and there will be no charge to the patient.

## 2023-07-16 ENCOUNTER — Encounter (INDEPENDENT_AMBULATORY_CARE_PROVIDER_SITE_OTHER): Payer: Self-pay

## 2023-07-16 ENCOUNTER — Other Ambulatory Visit (HOSPITAL_COMMUNITY): Payer: Self-pay

## 2023-07-16 ENCOUNTER — Other Ambulatory Visit: Payer: Self-pay

## 2023-07-16 ENCOUNTER — Inpatient Hospital Stay

## 2023-07-16 NOTE — Progress Notes (Signed)
 Specialty Pharmacy Refill Coordination Note  Lori  Lucciana Shaw is a 78 y.o. female contacted today regarding refills of specialty medication(s) Zanubrutinib  (BRUKINSA )   Patient requested Delivery   Delivery date: 07/23/23   Verified address: [address] [place] [address]   Medication will be filled on 07/22/23.

## 2023-07-19 ENCOUNTER — Other Ambulatory Visit (HOSPITAL_COMMUNITY): Payer: Self-pay

## 2023-07-22 ENCOUNTER — Other Ambulatory Visit: Payer: Self-pay

## 2023-08-10 ENCOUNTER — Other Ambulatory Visit: Payer: Self-pay

## 2023-08-10 ENCOUNTER — Other Ambulatory Visit (HOSPITAL_COMMUNITY): Payer: Self-pay

## 2023-08-10 ENCOUNTER — Other Ambulatory Visit: Payer: Self-pay | Admitting: Oncology

## 2023-08-10 DIAGNOSIS — C9110 Chronic lymphocytic leukemia of B-cell type not having achieved remission: Secondary | ICD-10-CM

## 2023-08-11 ENCOUNTER — Encounter: Payer: Self-pay | Admitting: Oncology

## 2023-08-11 ENCOUNTER — Other Ambulatory Visit: Payer: Self-pay

## 2023-08-11 MED ORDER — BRUKINSA 80 MG PO CAPS
160.0000 mg | ORAL_CAPSULE | Freq: Two times a day (BID) | ORAL | 1 refills | Status: AC
  Filled 2023-08-11 – 2023-08-16 (×3): qty 120, 30d supply, fill #0

## 2023-08-12 ENCOUNTER — Other Ambulatory Visit: Payer: Self-pay

## 2023-08-16 ENCOUNTER — Other Ambulatory Visit: Payer: Self-pay

## 2023-08-16 ENCOUNTER — Other Ambulatory Visit (HOSPITAL_COMMUNITY): Payer: Self-pay

## 2023-08-16 ENCOUNTER — Inpatient Hospital Stay: Attending: Oncology

## 2023-08-16 ENCOUNTER — Inpatient Hospital Stay (HOSPITAL_BASED_OUTPATIENT_CLINIC_OR_DEPARTMENT_OTHER): Admitting: Hospice and Palliative Medicine

## 2023-08-16 VITALS — BP 116/68 | HR 69 | Temp 97.3°F | Resp 16 | Wt 157.0 lb

## 2023-08-16 DIAGNOSIS — E538 Deficiency of other specified B group vitamins: Secondary | ICD-10-CM | POA: Diagnosis not present

## 2023-08-16 DIAGNOSIS — R21 Rash and other nonspecific skin eruption: Secondary | ICD-10-CM

## 2023-08-16 DIAGNOSIS — C9110 Chronic lymphocytic leukemia of B-cell type not having achieved remission: Secondary | ICD-10-CM | POA: Diagnosis not present

## 2023-08-16 DIAGNOSIS — D751 Secondary polycythemia: Secondary | ICD-10-CM

## 2023-08-16 MED ORDER — PREDNISONE 10 MG PO TABS: 10.0000 mg | ORAL_TABLET | Freq: Every day | ORAL | 0 refills | Status: DC

## 2023-08-16 MED ORDER — CYANOCOBALAMIN 1000 MCG/ML IJ SOLN
1000.0000 ug | INTRAMUSCULAR | Status: DC
  Administered 2023-08-16: 1000 ug via INTRAMUSCULAR
  Filled 2023-08-16: qty 1

## 2023-08-16 NOTE — Progress Notes (Signed)
 Symptom Management Clinic [facility] [phone] [phone]  Patient Care Team: Rilla Baller, MD as PCP - General (Family Medicine) Zaldivar, Renzo A, MD as Attending Physician (Ophthalmology) Reena Finder, DDS as Consulting Physician (Dentistry) Melanee Annah BROCKS, MD as Consulting Physician (Hematology and Oncology)   NAME OF PATIENT: Lori  Shaw  [id number]  January 01, 1946   DATE OF VISIT: 08/16/23  REASON FOR CONSULT: Lori  Tyja Shaw is a 78 y.o. female with multiple medical problems including stage IV CLL currently on Zanubrutinib .   INTERVAL HISTORY: Patient saw Dr. Melanee 07/14/23.  Patient has had ongoing and intermittent patchy erythematous rash on extremities and face despite dose reducing acalabrutinib  and switching to Zanubrutinib .  Patient was recommended to take daily Zyrtec and started on as needed hydroxyzine .  She was also suggested to take topical emollients and Kenalog  cream.  Plan was to start on low-dose prednisone  for 1-2 months if rash persisted.   Patient was an add-on to my schedule today with complaint of persistent rash.  Patient says that she has been holding Zanubrutinib  for the past couple of weeks.  States that rash has persisted.  Currently reports itchy red rash on her legs.  States that at times, they blister and will draining clear fluid.   Denies any neurologic complaints. Denies recent fevers or illnesses. Denies any easy bleeding or bruising. Reports good appetite and denies weight loss. Denies chest pain. Denies any nausea, vomiting, constipation, or diarrhea. Denies urinary complaints. Patient offers no further specific complaints today.   PAST MEDICAL HISTORY: Past Medical History:  Diagnosis Date   B12 deficiency    Back pain    CLL (chronic lymphocytic leukemia) (HCC) 06/21/2016   Rai stage 0, dx 06/2016 (Dr Melanee)   HLD (hyperlipidemia)    Joint pain    Knee problem     Macular degeneration of both eyes    OSA on CPAP 2006, 04/2014   AHI of 23.8 and an RDI of 42.9, on CPAP 9 cm H2O (Dohmeier)   Osteopenia 08/2010   T -1.4 at L femur neck   Piriformis syndrome 12/14/2014   Vitamin D  deficiency     PAST SURGICAL HISTORY:  Past Surgical History:  Procedure Laterality Date   ABDOMINAL HYSTERECTOMY  01/27/1983   for menometrorrhagia, one ovary remained   CHOLECYSTECTOMY N/A 08/01/2018   LAPAROSCOPIC CHOLECYSTECTOMY; for gallstone pancreatitis  Dorris, Delon, MD)   COLONOSCOPY  2012   no records available. per patient normal.    dexa  08/2010   osteopenia   IR BONE MARROW BIOPSY & ASPIRATION  08/26/2022   TONSILLECTOMY  01/26/1973    HEMATOLOGY/ONCOLOGY HISTORY:  Oncology History  CLL (chronic lymphocytic leukemia) (HCC)  06/21/2016 Initial Diagnosis   CLL (chronic lymphocytic leukemia) (HCC)   03/09/2023 Cancer Staging   Staging form: Chronic Lymphocytic Leukemia / Small Lymphocytic Lymphoma, AJCC 8th Edition - Clinical stage from 03/09/2023: Modified Rai Stage IV (Modified Rai risk: High, Binet: Stage B, Lymphocytosis: Present, Adenopathy: Present, Organomegaly: Absent, Anemia: Absent, Thrombocytopenia: Present) - Signed by Melanee Annah BROCKS, MD on 03/09/2023 Stage prefix: Initial diagnosis     ALLERGIES:  is allergic to sulfa antibiotics.  MEDICATIONS:  Current Outpatient Medications  Medication Sig Dispense Refill   allopurinol  (ZYLOPRIM ) 300 MG tablet Take 300 mg by mouth daily. (Patient not taking: Reported on 07/14/2023)     aspirin EC 81 MG tablet Take 81 mg by mouth daily. Swallow whole. (Patient not taking: Reported on 07/14/2023)  Cholecalciferol (VITAMIN D ) 125 MCG (5000 UT) CAPS Take 1 capsule by mouth daily. (Patient not taking: Reported on 07/14/2023) 30 capsule    Cyanocobalamin  (VITAMIN B-12) 2500 MCG SUBL Place 1 tablet (2,500 mcg total) under the tongue daily. (Patient not taking: Reported on 07/14/2023)  0   fluticasone  (FLONASE )  50 MCG/ACT nasal spray PLACE 2 SPRAYS INTO BOTH NOSTRILS DAILY AS NEEDED (Patient not taking: Reported on 07/14/2023) 16 mL 0   hydrOXYzine  (ATARAX ) 10 MG tablet Take 1 tablet (10 mg total) by mouth 3 (three) times daily as needed. 60 tablet 3   ivermectin (STROMECTOL) 3 MG TABS tablet Take 1 tablet by mouth once as needed. (Patient not taking: Reported on 07/14/2023)     Multiple Vitamins-Minerals (PRESERVISION AREDS) TABS Take 2 tablets by mouth daily. (Patient not taking: Reported on 07/14/2023)     triamcinolone  ointment (KENALOG ) 0.5 % Apply 1 Application topically 2 (two) times daily. (Patient not taking: Reported on 07/14/2023) 30 g 3   zanubrutinib  (BRUKINSA ) 80 MG capsule Take 2 capsules (160 mg total) by mouth 2 (two) times daily. 120 capsule 1   No current facility-administered medications for this visit.   Facility-Administered Medications Ordered in Other Visits  Medication Dose Route Frequency Provider Last Rate Last Admin   cyanocobalamin  (VITAMIN B12) injection 1,000 mcg  1,000 mcg Intramuscular Q30 days Rao, Archana C, MD   1,000 mcg at 08/16/23 1338    VITAL SIGNS: There were no vitals taken for this visit. There were no vitals filed for this visit.  Estimated body mass index is 27.12 kg/m as calculated from the following:   Height as of 07/14/23: 5' 4 (1.626 m).   Weight as of 07/14/23: 158 lb (71.7 kg).  LABS: CBC:    Component Value Date/Time   WBC 25.8 (H) 07/14/2023 1329   WBC 104.3 (HH) 03/09/2023 1329   HGB 14.3 07/14/2023 1329   HGB 14.4 02/02/2020 1448   HGB 14.5 04/10/2010 0000   HCT 43.5 07/14/2023 1329   HCT 43.3 02/02/2020 1448   PLT 118 (L) 07/14/2023 1329   PLT 168 02/02/2020 1448   MCV 99.8 07/14/2023 1329   MCV 91 02/02/2020 1448   NEUTROABS 3.2 07/14/2023 1329   NEUTROABS 3.3 11/25/2017 1208   LYMPHSABS 21.4 (H) 07/14/2023 1329   LYMPHSABS 8.7 (H) 11/25/2017 1208   MONOABS 0.7 07/14/2023 1329   EOSABS 0.2 07/14/2023 1329   EOSABS 0.3  11/25/2017 1208   BASOSABS 0.2 (H) 07/14/2023 1329   BASOSABS 0.1 11/25/2017 1208   Comprehensive Metabolic Panel:    Component Value Date/Time   NA 140 07/14/2023 1329   NA 142 02/02/2020 1448   K 4.5 07/14/2023 1329   CL 106 07/14/2023 1329   CO2 26 07/14/2023 1329   BUN 17 07/14/2023 1329   BUN 11 02/02/2020 1448   CREATININE 0.58 07/14/2023 1329   GLUCOSE 87 07/14/2023 1329   CALCIUM  8.7 (L) 07/14/2023 1329   AST 21 07/14/2023 1329   ALT 17 07/14/2023 1329   ALKPHOS 49 07/14/2023 1329   ALKPHOS 79 04/10/2010 0000   BILITOT 1.0 07/14/2023 1329   PROT 6.5 07/14/2023 1329   PROT 6.2 02/02/2020 1448   ALBUMIN 4.0 07/14/2023 1329   ALBUMIN 4.6 02/02/2020 1448    RADIOGRAPHIC STUDIES: No results found.  PERFORMANCE STATUS (ECOG) : 1 - Symptomatic but completely ambulatory  Review of Systems Unless otherwise noted, a complete review of systems is negative.  Physical Exam General: NAD Cardiovascular: regular rate  and rhythm Pulmonary: clear ant fields Abdomen: soft, nontender, + bowel sounds GU: no suprapubic tenderness Extremities: no edema, no joint deformities Skin: rash to legs, various stages of healing Neurological:  nonfocal    IMPRESSION/PLAN: CLL  - on Zanubrutinib  but patient currently holding due to rash. She wants to continue holding until rash has resolved.   Rash - likely secondary to Zanubrutinib , although no significant improvement after patient has been holding treatment for past 2 weeks.  Will start patient on low-dose prednisone  daily x 2 weeks.   Follow up 1-2 weeks   Patient expressed understanding and was in agreement with this plan. She also understands that She can call clinic at any time with any questions, concerns, or complaints.   Thank you for allowing me to participate in the care of this very pleasant patient.   Time Total: 15 minutes  Visit consisted of counseling and education dealing with the complex and emotionally intense  issues of symptom management in the setting of serious illness.Greater than 50%  of this time was spent counseling and coordinating care related to the above assessment and plan.  Signed by: Fonda Mower, PhD, NP-C

## 2023-08-16 NOTE — Progress Notes (Signed)
 Clinical Intervention Note  Clinical Intervention Notes: Patient reports experiencing a widespread, intensely itchy rash. I informed her that rash is, unfortunately, a known potential side effect of Brukinsa . I advised her to contact her provider's office to request an earlier evaluation to determine if any treatment adjustments are necessary. She is scheduled for an injection today and plans to call the office to see if she can be seen. All questions were addressed.   Clinical Intervention Outcomes: Prevention of an adverse drug event   Silvano LOISE Blair Karel Santa

## 2023-08-26 ENCOUNTER — Other Ambulatory Visit: Payer: Self-pay | Admitting: Hospice and Palliative Medicine

## 2023-08-26 DIAGNOSIS — D3131 Benign neoplasm of right choroid: Secondary | ICD-10-CM | POA: Diagnosis not present

## 2023-08-26 DIAGNOSIS — H25813 Combined forms of age-related cataract, bilateral: Secondary | ICD-10-CM | POA: Diagnosis not present

## 2023-08-26 DIAGNOSIS — H353221 Exudative age-related macular degeneration, left eye, with active choroidal neovascularization: Secondary | ICD-10-CM | POA: Diagnosis not present

## 2023-08-26 DIAGNOSIS — H353112 Nonexudative age-related macular degeneration, right eye, intermediate dry stage: Secondary | ICD-10-CM | POA: Diagnosis not present

## 2023-08-26 DIAGNOSIS — D3132 Benign neoplasm of left choroid: Secondary | ICD-10-CM | POA: Diagnosis not present

## 2023-08-30 ENCOUNTER — Inpatient Hospital Stay: Attending: Oncology | Admitting: Hospice and Palliative Medicine

## 2023-08-30 ENCOUNTER — Encounter: Payer: Self-pay | Admitting: Hospice and Palliative Medicine

## 2023-08-30 VITALS — BP 117/54 | HR 95 | Temp 97.4°F | Resp 16 | Wt 163.0 lb

## 2023-08-30 DIAGNOSIS — C9110 Chronic lymphocytic leukemia of B-cell type not having achieved remission: Secondary | ICD-10-CM | POA: Diagnosis not present

## 2023-08-30 DIAGNOSIS — R21 Rash and other nonspecific skin eruption: Secondary | ICD-10-CM | POA: Insufficient documentation

## 2023-08-30 MED ORDER — HYDROXYZINE HCL 10 MG PO TABS: 10.0000 mg | ORAL_TABLET | Freq: Three times a day (TID) | ORAL | 3 refills | Status: AC | PRN

## 2023-08-30 MED ORDER — PREDNISONE 5 MG PO TABS
ORAL_TABLET | ORAL | 0 refills | Status: AC
Start: 2023-08-30 — End: ?

## 2023-08-30 NOTE — Progress Notes (Signed)
 Symptom Management Clinic [facility] at [facility] [phone] [phone]  Patient Care Team: Rilla Baller, MD as PCP - General (Family Medicine) Zaldivar, Renzo A, MD as Attending Physician (Ophthalmology) Reena Finder, DDS as Consulting Physician (Dentistry) Melanee Annah BROCKS, MD as Consulting Physician (Oncology)   NAME OF PATIENT: Lori  Shaw  [id number]  06/28/1945   DATE OF VISIT: 08/30/23  REASON FOR CONSULT: Lori  Nakira Shaw is a 78 y.o. female with multiple medical problems including stage IV CLL currently on Zanubrutinib .   INTERVAL HISTORY: Patient saw Dr. Melanee 07/14/23.  Patient has had ongoing and intermittent patchy erythematous rash on extremities and face despite dose reducing acalabrutinib  and switching to Zanubrutinib .  Patient was recommended to take daily Zyrtec and started on as needed hydroxyzine .  She was also suggested to take topical emollients and Kenalog  cream.  Plan was to start on low-dose prednisone  for 1-2 months if rash persisted.   Patient was seen in [facility] on 08/24/2023 with persistent rash. She has been holding Zanubrutinib .  Patient was started on low-dose prednisone .  Patient returns to [facility] today for follow-up.  Patient reports improvement in her rash on steroids.  However, she states that she is still getting occasional pruritic lesions on her arms and legs.   Denies any neurologic complaints. Denies recent fevers or illnesses. Denies any easy bleeding or bruising. Reports good appetite and denies weight loss. Denies chest pain. Denies any nausea, vomiting, constipation, or diarrhea. Denies urinary complaints. Patient offers no further specific complaints today.   PAST MEDICAL HISTORY: Past Medical History:  Diagnosis Date   B12 deficiency    Back pain    CLL (chronic lymphocytic leukemia) (HCC) 06/21/2016   Rai stage 0, dx 06/2016 (Dr Melanee)   HLD (hyperlipidemia)    Joint pain    Knee  problem    Macular degeneration of both eyes    OSA on CPAP 2006, 04/2014   AHI of 23.8 and an RDI of 42.9, on CPAP 9 cm H2O (Dohmeier)   Osteopenia 08/2010   T -1.4 at L femur neck   Piriformis syndrome 12/14/2014   Vitamin D  deficiency     PAST SURGICAL HISTORY:  Past Surgical History:  Procedure Laterality Date   ABDOMINAL HYSTERECTOMY  01/27/1983   for menometrorrhagia, one ovary remained   CHOLECYSTECTOMY N/A 08/01/2018   LAPAROSCOPIC CHOLECYSTECTOMY; for gallstone pancreatitis  Dorris, Delon, MD)   COLONOSCOPY  2012   no records available. per patient normal.    dexa  08/2010   osteopenia   IR BONE MARROW BIOPSY & ASPIRATION  08/26/2022   TONSILLECTOMY  01/26/1973    HEMATOLOGY/ONCOLOGY HISTORY:  Oncology History  CLL (chronic lymphocytic leukemia) (HCC)  06/21/2016 Initial Diagnosis   CLL (chronic lymphocytic leukemia) (HCC)   03/09/2023 Cancer Staging   Staging form: Chronic Lymphocytic Leukemia / Small Lymphocytic Lymphoma, AJCC 8th Edition - Clinical stage from 03/09/2023: Modified Rai Stage IV (Modified Rai risk: High, Binet: Stage B, Lymphocytosis: Present, Adenopathy: Present, Organomegaly: Absent, Anemia: Absent, Thrombocytopenia: Present) - Signed by Melanee Annah BROCKS, MD on 03/09/2023 Stage prefix: Initial diagnosis     ALLERGIES:  is allergic to sulfa antibiotics.  MEDICATIONS:  Current Outpatient Medications  Medication Sig Dispense Refill   allopurinol  (ZYLOPRIM ) 300 MG tablet Take 300 mg by mouth daily.     aspirin EC 81 MG tablet Take 81 mg by mouth daily. Swallow whole.     Cholecalciferol (VITAMIN D ) 125 MCG (5000 UT) CAPS Take  1 capsule by mouth daily. 30 capsule    Cyanocobalamin  (VITAMIN B-12) 2500 MCG SUBL Place 1 tablet (2,500 mcg total) under the tongue daily.  0   fluticasone  (FLONASE ) 50 MCG/ACT nasal spray PLACE 2 SPRAYS INTO BOTH NOSTRILS DAILY AS NEEDED 16 mL 0   hydrOXYzine  (ATARAX ) 10 MG tablet Take 1 tablet (10 mg total) by mouth 3 (three)  times daily as needed. 60 tablet 3   ivermectin (STROMECTOL) 3 MG TABS tablet Take 1 tablet by mouth once as needed.     Multiple Vitamins-Minerals (PRESERVISION AREDS) TABS Take 2 tablets by mouth daily.     predniSONE  (DELTASONE ) 10 MG tablet TAKE 1 TABLET (10 MG TOTAL) BY MOUTH DAILY WITH BREAKFAST. 15 tablet 0   triamcinolone  ointment (KENALOG ) 0.5 % Apply 1 Application topically 2 (two) times daily. 30 g 3   zanubrutinib  (BRUKINSA ) 80 MG capsule Take 2 capsules (160 mg total) by mouth 2 (two) times daily. 120 capsule 1   No current facility-administered medications for this visit.    VITAL SIGNS: BP (!) 117/54 (BP Location: Left Arm, Patient Position: Sitting, Cuff Size: Normal)   Pulse 95   Temp (!) 97.4 F (36.3 C) (Tympanic)   Resp 16   Wt 163 lb (73.9 kg)   SpO2 97%   BMI 27.98 kg/m  Filed Weights   08/30/23 1406  Weight: 163 lb (73.9 kg)    Estimated body mass index is 27.98 kg/m as calculated from the following:   Height as of 07/14/23: 5' 4 (1.626 m).   Weight as of this encounter: 163 lb (73.9 kg).  LABS: CBC:    Component Value Date/Time   WBC 25.8 (H) 07/14/2023 1329   WBC 104.3 (HH) 03/09/2023 1329   HGB 14.3 07/14/2023 1329   HGB 14.4 02/02/2020 1448   HGB 14.5 04/10/2010 0000   HCT 43.5 07/14/2023 1329   HCT 43.3 02/02/2020 1448   PLT 118 (L) 07/14/2023 1329   PLT 168 02/02/2020 1448   MCV 99.8 07/14/2023 1329   MCV 91 02/02/2020 1448   NEUTROABS 3.2 07/14/2023 1329   NEUTROABS 3.3 11/25/2017 1208   LYMPHSABS 21.4 (H) 07/14/2023 1329   LYMPHSABS 8.7 (H) 11/25/2017 1208   MONOABS 0.7 07/14/2023 1329   EOSABS 0.2 07/14/2023 1329   EOSABS 0.3 11/25/2017 1208   BASOSABS 0.2 (H) 07/14/2023 1329   BASOSABS 0.1 11/25/2017 1208   Comprehensive Metabolic Panel:    Component Value Date/Time   NA 140 07/14/2023 1329   NA 142 02/02/2020 1448   K 4.5 07/14/2023 1329   CL 106 07/14/2023 1329   CO2 26 07/14/2023 1329   BUN 17 07/14/2023 1329   BUN 11  02/02/2020 1448   CREATININE 0.58 07/14/2023 1329   GLUCOSE 87 07/14/2023 1329   CALCIUM  8.7 (L) 07/14/2023 1329   AST 21 07/14/2023 1329   ALT 17 07/14/2023 1329   ALKPHOS 49 07/14/2023 1329   ALKPHOS 79 04/10/2010 0000   BILITOT 1.0 07/14/2023 1329   PROT 6.5 07/14/2023 1329   PROT 6.2 02/02/2020 1448   ALBUMIN 4.0 07/14/2023 1329   ALBUMIN 4.6 02/02/2020 1448    RADIOGRAPHIC STUDIES: No results found.  PERFORMANCE STATUS (ECOG) : 1 - Symptomatic but completely ambulatory  Review of Systems Unless otherwise noted, a complete review of systems is negative.  Physical Exam General: NAD Cardiovascular: regular rate and rhythm Pulmonary: clear ant fields Abdomen: soft, nontender, + bowel sounds GU: no suprapubic tenderness Extremities: no edema, no joint  deformities Skin: improving rash Neurological:  nonfocal    IMPRESSION/PLAN: CLL  - on Zanubrutinib  but patient currently holding due to rash. She wants to continue holding until rash has resolved.   Rash - likely secondary to Zanubrutinib .  Rash appears to be improving on prednisone .  Discussed with Dr. Melanee and will go ahead and start weaning prednisone .  Patient to decrease prednisone  from 10 mg to 5 mg daily x 1 week and then 2.5 mg daily x 1 week and then will discontinue.  Patient will see Dr. Melanee in 3 weeks  Follow up with MD in 3 weeks. Can be seen sooner in [facility] if needed.   Case and plan discussed with Dr. Melanee   Patient expressed understanding and was in agreement with this plan. She also understands that She can call clinic at any time with any questions, concerns, or complaints.   Thank you for allowing me to participate in the care of this very pleasant patient.   Time Total: 15 minutes  Visit consisted of counseling and education dealing with the complex and emotionally intense issues of symptom management in the setting of serious illness.Greater than 50%  of this time was spent counseling and coordinating  care related to the above assessment and plan.  Signed by: Fonda Mower, PhD, NP-C

## 2023-09-06 ENCOUNTER — Other Ambulatory Visit: Payer: Self-pay | Admitting: Oncology

## 2023-09-06 NOTE — Telephone Encounter (Signed)
 I would like to see her instead of lauren on 8/25. Based on the picture rash looks better to me.

## 2023-09-10 DIAGNOSIS — H2513 Age-related nuclear cataract, bilateral: Secondary | ICD-10-CM | POA: Diagnosis not present

## 2023-09-16 ENCOUNTER — Inpatient Hospital Stay

## 2023-09-20 ENCOUNTER — Inpatient Hospital Stay

## 2023-09-20 ENCOUNTER — Other Ambulatory Visit

## 2023-09-20 ENCOUNTER — Ambulatory Visit: Admitting: Oncology

## 2023-09-22 DIAGNOSIS — H2511 Age-related nuclear cataract, right eye: Secondary | ICD-10-CM | POA: Diagnosis not present

## 2023-09-22 DIAGNOSIS — H25811 Combined forms of age-related cataract, right eye: Secondary | ICD-10-CM | POA: Diagnosis not present

## 2023-09-28 ENCOUNTER — Other Ambulatory Visit: Payer: Self-pay

## 2023-09-30 ENCOUNTER — Other Ambulatory Visit: Payer: Self-pay

## 2023-10-07 DIAGNOSIS — C9110 Chronic lymphocytic leukemia of B-cell type not having achieved remission: Secondary | ICD-10-CM | POA: Diagnosis not present

## 2023-10-08 ENCOUNTER — Other Ambulatory Visit: Payer: Self-pay | Admitting: Family Medicine

## 2023-10-08 DIAGNOSIS — Z1231 Encounter for screening mammogram for malignant neoplasm of breast: Secondary | ICD-10-CM

## 2023-10-19 DIAGNOSIS — L308 Other specified dermatitis: Secondary | ICD-10-CM | POA: Diagnosis not present

## 2023-10-29 ENCOUNTER — Inpatient Hospital Stay: Admission: RE | Admit: 2023-10-29 | Payer: Medicare HMO | Source: Ambulatory Visit

## 2023-11-01 ENCOUNTER — Other Ambulatory Visit: Payer: Self-pay

## 2023-11-30 ENCOUNTER — Other Ambulatory Visit: Payer: Self-pay

## 2023-12-13 ENCOUNTER — Other Ambulatory Visit: Payer: Self-pay

## 2023-12-14 ENCOUNTER — Other Ambulatory Visit: Payer: Self-pay

## 2023-12-14 NOTE — Progress Notes (Signed)
 Patient has not filled her Brukinsa  since 07/23/23 and has not kept appointments.  Disenrolling from Specialty Pharmacy services at this time.

## 2024-02-01 ENCOUNTER — Other Ambulatory Visit (HOSPITAL_COMMUNITY): Payer: Self-pay
